# Patient Record
Sex: Female | Born: 1940 | Race: Black or African American | Hispanic: No | Smoking: Never smoker
Health system: Southern US, Community
[De-identification: ages and names within clinical notes are randomized; demographics above are authoritative.]

## PROBLEM LIST (undated history)

## (undated) DIAGNOSIS — M810 Age-related osteoporosis without current pathological fracture: Secondary | ICD-10-CM

## (undated) DIAGNOSIS — I10 Essential (primary) hypertension: Secondary | ICD-10-CM

## (undated) DIAGNOSIS — M1990 Unspecified osteoarthritis, unspecified site: Secondary | ICD-10-CM

## (undated) HISTORY — DX: Age-related osteoporosis without current pathological fracture: M81.0

## (undated) HISTORY — PX: TUBAL LIGATION: SHX77

## (undated) HISTORY — PX: TOTAL KNEE ARTHROPLASTY: SHX125

## (undated) HISTORY — PX: TOTAL HIP ARTHROPLASTY: SHX124

---

## 2001-12-25 ENCOUNTER — Encounter: Payer: Self-pay | Admitting: Emergency Medicine

## 2001-12-25 ENCOUNTER — Emergency Department (HOSPITAL_COMMUNITY): Admission: EM | Admit: 2001-12-25 | Discharge: 2001-12-25 | Payer: Self-pay | Admitting: Emergency Medicine

## 2002-03-03 ENCOUNTER — Observation Stay (HOSPITAL_COMMUNITY): Admission: EM | Admit: 2002-03-03 | Discharge: 2002-03-04 | Payer: Self-pay | Admitting: Emergency Medicine

## 2002-03-03 ENCOUNTER — Encounter: Payer: Self-pay | Admitting: Emergency Medicine

## 2002-03-04 ENCOUNTER — Encounter: Payer: Self-pay | Admitting: Internal Medicine

## 2002-03-12 ENCOUNTER — Ambulatory Visit (HOSPITAL_COMMUNITY): Admission: RE | Admit: 2002-03-12 | Discharge: 2002-03-12 | Payer: Self-pay | Admitting: General Surgery

## 2002-03-16 ENCOUNTER — Emergency Department (HOSPITAL_COMMUNITY): Admission: EM | Admit: 2002-03-16 | Discharge: 2002-03-16 | Payer: Self-pay | Admitting: *Deleted

## 2002-04-28 ENCOUNTER — Other Ambulatory Visit: Admission: RE | Admit: 2002-04-28 | Discharge: 2002-04-28 | Payer: Self-pay | Admitting: *Deleted

## 2002-04-30 ENCOUNTER — Encounter: Payer: Self-pay | Admitting: *Deleted

## 2002-04-30 ENCOUNTER — Ambulatory Visit (HOSPITAL_COMMUNITY): Admission: RE | Admit: 2002-04-30 | Discharge: 2002-04-30 | Payer: Self-pay | Admitting: Internal Medicine

## 2002-05-03 ENCOUNTER — Emergency Department (HOSPITAL_COMMUNITY): Admission: EM | Admit: 2002-05-03 | Discharge: 2002-05-03 | Payer: Self-pay | Admitting: *Deleted

## 2002-05-03 ENCOUNTER — Encounter: Payer: Self-pay | Admitting: *Deleted

## 2002-12-11 ENCOUNTER — Encounter: Payer: Self-pay | Admitting: Orthopedic Surgery

## 2002-12-15 ENCOUNTER — Inpatient Hospital Stay (HOSPITAL_COMMUNITY): Admission: RE | Admit: 2002-12-15 | Discharge: 2002-12-19 | Payer: Self-pay | Admitting: Orthopedic Surgery

## 2002-12-30 ENCOUNTER — Ambulatory Visit (HOSPITAL_COMMUNITY): Admission: RE | Admit: 2002-12-30 | Discharge: 2002-12-30 | Payer: Self-pay | Admitting: Orthopedic Surgery

## 2005-08-15 ENCOUNTER — Emergency Department (HOSPITAL_COMMUNITY): Admission: EM | Admit: 2005-08-15 | Discharge: 2005-08-15 | Payer: Self-pay | Admitting: Emergency Medicine

## 2006-04-20 ENCOUNTER — Ambulatory Visit (HOSPITAL_COMMUNITY): Admission: RE | Admit: 2006-04-20 | Discharge: 2006-04-20 | Payer: Self-pay | Admitting: *Deleted

## 2006-04-20 ENCOUNTER — Emergency Department (HOSPITAL_COMMUNITY): Admission: EM | Admit: 2006-04-20 | Discharge: 2006-04-20 | Payer: Self-pay | Admitting: Emergency Medicine

## 2013-04-29 ENCOUNTER — Emergency Department (HOSPITAL_COMMUNITY)
Admission: EM | Admit: 2013-04-29 | Discharge: 2013-04-29 | Disposition: A | Discharge status: Home / Self Care | Payer: Medicare HMO | Attending: Emergency Medicine | Admitting: Emergency Medicine

## 2013-04-29 ENCOUNTER — Emergency Department (HOSPITAL_COMMUNITY): Payer: Medicare HMO

## 2013-04-29 ENCOUNTER — Encounter (HOSPITAL_COMMUNITY): Payer: Self-pay | Admitting: Emergency Medicine

## 2013-04-29 DIAGNOSIS — M545 Low back pain, unspecified: Secondary | ICD-10-CM | POA: Insufficient documentation

## 2013-04-29 DIAGNOSIS — Z791 Long term (current) use of non-steroidal anti-inflammatories (NSAID): Secondary | ICD-10-CM | POA: Insufficient documentation

## 2013-04-29 DIAGNOSIS — Z79899 Other long term (current) drug therapy: Secondary | ICD-10-CM | POA: Insufficient documentation

## 2013-04-29 DIAGNOSIS — M129 Arthropathy, unspecified: Secondary | ICD-10-CM | POA: Insufficient documentation

## 2013-04-29 DIAGNOSIS — I10 Essential (primary) hypertension: Secondary | ICD-10-CM | POA: Insufficient documentation

## 2013-04-29 HISTORY — DX: Essential (primary) hypertension: I10

## 2013-04-29 HISTORY — DX: Unspecified osteoarthritis, unspecified site: M19.90

## 2013-04-29 MED ORDER — HYDROCODONE-ACETAMINOPHEN 5-325 MG PO TABS: 1.0000 | ORAL_TABLET | Freq: Four times a day (QID) | ORAL | Status: DC | PRN

## 2013-04-29 MED ORDER — HYDROCODONE-ACETAMINOPHEN 5-325 MG PO TABS
2.0000 | ORAL_TABLET | Freq: Once | ORAL | Status: AC
  Administered 2013-04-29: 2 via ORAL
  Filled 2013-04-29: qty 2

## 2013-04-29 NOTE — ED Notes (Signed)
Back pain for 1 week, alert, Says it hurts to get out of bed.  Alert, talkative.  Dr Adriana Simas in to examine.

## 2013-04-29 NOTE — ED Provider Notes (Signed)
Medical screening examination/treatment/procedure(s) were conducted as a shared visit with non-physician practitioner(s) and myself.  I personally evaluated the patient during the encounter.  EKG Interpretation   None      Lower back pain with radiation to left leg. No bowel or bladder incontinence. Plain films show a multilevel degenerative disc disease. Referral to general orthopedics in [place], MD 04/29/13 1506

## 2013-04-29 NOTE — ED Provider Notes (Signed)
CSN: [id number]     Arrival date & time 04/29/13  1006 History   First MD Initiated Contact with Patient 04/29/13 1041     Chief Complaint  Patient presents with  . Back Pain   (Consider location/radiation/quality/duration/timing/severity/associated sxs/prior Treatment) HPI Comments: Patient presents today with a chief complaint of lower back pain.  She reports that the pain has been present for the past 5 days and is gradually worsening.  Pain radiates down her left leg.  She has taken Tramadol for the pain without relief.  She denies any acute injury or trauma.  She reports that she has had similar pain once in the past.  She states that the pain is worse with movement.  She has been able to ambulate.  She denies numbness, tingling, fever, chills, urinary symptoms, or bowel/bladder incontinence.    The history is provided by the patient.    Past Medical History  Diagnosis Date  . Hypertension   . Arthritis    Past Surgical History  Procedure Laterality Date  . Total knee arthroplasty    . Tubal ligation     No family history on file. History  Substance Use Topics  . Smoking status: Never Smoker   . Smokeless tobacco: Not on file  . Alcohol Use: No   OB History   Grav Para Term Preterm Abortions TAB SAB Ect Mult Living                 Review of Systems  All other systems reviewed and are negative.    Allergies  Codeine and Diphenhydramine  Home Medications   Current Outpatient Rx  Name  Route  Sig  Dispense  Refill  . amLODipine (NORVASC) 10 MG tablet   Oral   Take 1 tablet by mouth daily.         . CELEBREX 200 MG capsule   Oral   Take 1 capsule by mouth daily.         . hydrochlorothiazide (HYDRODIURIL) 25 MG tablet   Oral   Take 1 tablet by mouth daily.         [name] lisinopril (PRINIVIL,ZESTRIL) 30 MG tablet   Oral   Take 1 tablet by mouth daily.         [name] NEXIUM 40 MG capsule   Oral   Take 1 capsule by mouth daily.         . potassium  chloride SA (K-DUR,KLOR-CON) 20 MEQ tablet   Oral   Take 1 tablet by mouth daily.         . traMADol (ULTRAM) 50 MG tablet   Oral   Take 1 tablet by mouth 3 (three) times daily.          BP 145/60  Pulse 86  Temp(Src) 98.3 F (36.8 C)  Resp 20  Ht 5\' 1"  (1.549 m)  Wt 207 lb (93.895 kg)  BMI 39.13 kg/m2  SpO2 100% Physical Exam  Nursing note and vitals reviewed. Constitutional: She appears well-developed and well-nourished.  HENT:  Head: Normocephalic and atraumatic.  Neck: Normal range of motion. Neck supple.  Cardiovascular: Normal rate, regular rhythm and normal heart sounds.   Pulmonary/Chest: Effort normal and breath sounds normal.  Musculoskeletal: Normal range of motion.       Cervical back: She exhibits normal range of motion, no tenderness, no bony tenderness, no swelling, no edema and no deformity.       Thoracic back: She exhibits normal range of motion, no  tenderness, no bony tenderness, no swelling, no edema and no deformity.       Lumbar back: She exhibits tenderness and bony tenderness. She exhibits normal range of motion, no swelling, no edema and no deformity.  Neurological: She is alert. She has normal strength. No sensory deficit. Gait normal.  Reflex Scores:      Patellar reflexes are 2+ on the right side and 2+ on the left side.      Achilles reflexes are 2+ on the right side and 2+ on the left side. Skin: Skin is warm and dry.  Psychiatric: She has a normal mood and affect.    ED Course  Procedures (including critical care time) Labs Review Labs Reviewed - No data to display Imaging Review Dg Lumbar Spine Complete  04/29/2013   CLINICAL DATA:  Back pain extending down the left leg for 1 week. No injury.  EXAM: LUMBAR SPINE - COMPLETE 4+ VIEW  COMPARISON:  None.  FINDINGS: There is mild upper lumbar levoscoliosis. No compression fracture is identified. There is no listhesis. Multilevel disc space narrowing is present throughout the lumbar spine,  most notably at L4-5 on the left where it is severe. There is also moderate to severe disc space narrowing at L5-S1 as well as moderate disc space narrowing at L3-4. Associated degenerative endplate spurring is present throughout the lumbar spine. No pars defects are identified. Facet arthrosis is present at L4-5 and L5-S1. Nonobstructed bowel gas pattern is partially visualized.  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   Electronically Signed   By: Sebastian Ache   On: 04/29/2013 12:22    EKG Interpretation   None      Patient discussed with Dr. Adriana Simas who also evaluated the patient. MDM  No diagnosis found. Patient with back pain.  No neurological deficits and normal neuro exam.  Patient can walk but states is painful.  No loss of bowel or bladder control.  No concern for cauda equina.  No fever, night sweats, weight loss, h/o cancer, IVDU.  Xray showing multilevel degenerative disc disease and facet arthrosis.  Patient instructed to follow up with her PCP and also given referral to Orthopedics.  Patient discharged home with Rx for Norco.  Patient stable for discharge.  Return precautions given.     Santiago Glad, PA-C 04/29/13 1421

## 2013-04-29 NOTE — ED Notes (Signed)
Pt c/o lower back pain radiating down left leg.  

## 2013-04-29 NOTE — ED Notes (Signed)
PA in with pt 

## 2014-02-20 ENCOUNTER — Emergency Department (HOSPITAL_COMMUNITY): Payer: Medicare HMO

## 2014-02-20 ENCOUNTER — Encounter (HOSPITAL_COMMUNITY): Payer: Self-pay | Admitting: Emergency Medicine

## 2014-02-20 ENCOUNTER — Emergency Department (HOSPITAL_COMMUNITY)
Admission: EM | Admit: 2014-02-20 | Discharge: 2014-02-20 | Disposition: A | Discharge status: Home / Self Care | Payer: Medicare HMO | Attending: Emergency Medicine | Admitting: Emergency Medicine

## 2014-02-20 DIAGNOSIS — I10 Essential (primary) hypertension: Secondary | ICD-10-CM | POA: Diagnosis not present

## 2014-02-20 DIAGNOSIS — E876 Hypokalemia: Secondary | ICD-10-CM | POA: Insufficient documentation

## 2014-02-20 DIAGNOSIS — Z79899 Other long term (current) drug therapy: Secondary | ICD-10-CM | POA: Diagnosis not present

## 2014-02-20 DIAGNOSIS — Z8739 Personal history of other diseases of the musculoskeletal system and connective tissue: Secondary | ICD-10-CM | POA: Insufficient documentation

## 2014-02-20 DIAGNOSIS — J029 Acute pharyngitis, unspecified: Secondary | ICD-10-CM | POA: Insufficient documentation

## 2014-02-20 DIAGNOSIS — R1310 Dysphagia, unspecified: Secondary | ICD-10-CM | POA: Diagnosis not present

## 2014-02-20 LAB — I-STAT CHEM 8, ED
BUN: 16 mg/dL (ref 6–23)
CREATININE: 1 mg/dL (ref 0.50–1.10)
Calcium, Ion: 1.24 mmol/L (ref 1.13–1.30)
Chloride: 102 mEq/L (ref 96–112)
GLUCOSE: 100 mg/dL — AB (ref 70–99)
HCT: 38 % (ref 36.0–46.0)
HEMOGLOBIN: 12.9 g/dL (ref 12.0–15.0)
Potassium: 2.9 mEq/L — CL (ref 3.7–5.3)
Sodium: 139 mEq/L (ref 137–147)
TCO2: 26 mmol/L (ref 0–100)

## 2014-02-20 MED ORDER — POTASSIUM CHLORIDE ER 10 MEQ PO TBCR: 10.0000 meq | EXTENDED_RELEASE_TABLET | Freq: Every day | ORAL | Status: DC

## 2014-02-20 MED ORDER — IOHEXOL 300 MG/ML  SOLN
75.0000 mL | Freq: Once | INTRAMUSCULAR | Status: AC | PRN
  Administered 2014-02-20: 75 mL via INTRAVENOUS

## 2014-02-20 NOTE — ED Notes (Signed)
Patient talking, no SOB, states sore when she swallows.

## 2014-02-20 NOTE — ED Notes (Signed)
Pt states was eating a piece of chicken this morning and choked on a piece of bone. Able to swallow but is painful.

## 2014-02-20 NOTE — Discharge Instructions (Signed)
Eat soft foods for 2-3 days until pain subsides - there is no bone or other foreign body on your xrays.  Please call your doctor for a followup appointment within 24-48 hours. When you talk to your doctor please let them know that you were seen in the emergency department and have them acquire all of your records so that they can discuss the findings with you and formulate a treatment plan to fully care for your new and ongoing problems.  [facility] Primary Care Doctor List    Sinda Du MD. Specialty: Pulmonary Disease Contact information: [address]  [place] [address]  [phone]   Tula Nakayama, MD. Specialty: [facility] Contact information: [address] [place] [address]  [phone]   Sallee Lange, MD. Specialty: [facility] Medicine Contact information: [place]  [place] [address]  [phone]   Rosita Fire, MD Specialty: Internal Medicine Contact information: [place] [address]  [phone]   Delphina Cahill, MD. Specialty: Internal Medicine Contact information: [place] [address]  [phone]   Marjean Donna, MD. Specialty: Family Medicine Contact information: [place] [address]  [phone]   Leslie Andrea, MD. Specialty: [facility] Medicine Contact information: [place] [address]  [phone]   Asencion Noble, MD. Specialty: Internal Medicine Contact information: [place]  [place] [address]  [phone]

## 2014-02-20 NOTE — ED Provider Notes (Signed)
Patient informed of results including low potassium as well as normal CT scan, stable for discharge, potassium prescription given.  Johnna Acosta, MD 02/20/14 [phone]

## 2014-02-20 NOTE — ED Provider Notes (Signed)
CSN: [id number]     Arrival date & time 02/20/14  1328 History  This chart was scribed for Melissa Cable, MD by Ludger Nutting, ED Scribe. This patient was seen in room APA09/APA09 and the patient's care was started 2:06 PM.    Chief Complaint  Patient presents with  . Dysphagia   Patient is a 73 y.o. female presenting with pharyngitis. The history is provided by the patient. No language interpreter was used.  Sore Throat This is a new problem. The current episode started 3 to 5 hours ago. The problem occurs constantly. The problem has not changed since onset.Pertinent negatives include no shortness of breath. The symptoms are aggravated by swallowing. Nothing relieves the symptoms. She has tried food and water for the symptoms. The treatment provided no relief.    HPI Comments: RAEGYN RENDA is a 73 y.o. female who presents to the Emergency Department complaining of choking and swallowing a bone while eating a piece of chicken 3 hours ago. Patient states she has pain with swallowing which she describes as burning. She states she tried the Heimlich maneuver and tried to vomit in order to remove the piece. She also attempted to eat bread with hopes that it would dislodge the bone. She states it feels as though the bone is "hanging there". She denies SOB, vomiting, difficulty tolerating secretions.   Past Medical History  Diagnosis Date  . Hypertension   . Arthritis    Past Surgical History  Procedure Laterality Date  . Total knee arthroplasty    . Tubal ligation    . Total knee arthroplasty Bilateral    History reviewed. No pertinent family history. History  Substance Use Topics  . Smoking status: Never Smoker   . Smokeless tobacco: Not on file  . Alcohol Use: No   OB History   Grav Para Term Preterm Abortions TAB SAB Ect Mult Living                 Review of Systems  HENT: Positive for sore throat. Negative for trouble swallowing.   Respiratory: Negative for shortness of  breath.   Gastrointestinal: Negative for vomiting.  All other systems reviewed and are negative.     Allergies  Codeine and Diphenhydramine  Home Medications   Prior to Admission medications   Medication Sig Start Date End Date Taking? Authorizing Provider  amLODipine (NORVASC) 10 MG tablet Take 1 tablet by mouth daily. 04/12/13   Historical Provider, MD  CELEBREX 200 MG capsule Take 1 capsule by mouth daily. 04/25/13   Historical Provider, MD  hydrochlorothiazide (HYDRODIURIL) 25 MG tablet Take 1 tablet by mouth daily. 04/12/13   Historical Provider, MD  HYDROcodone-acetaminophen (NORCO/VICODIN) 5-325 MG per tablet Take 1-2 tablets by mouth every 6 (six) hours as needed. 04/29/13   Heather Laisure, PA-C  lisinopril (PRINIVIL,ZESTRIL) 30 MG tablet Take 1 tablet by mouth daily. 04/16/13   Historical Provider, MD  NEXIUM 40 MG capsule Take 1 capsule by mouth daily. 04/12/13   Historical Provider, MD  potassium chloride SA (K-DUR,KLOR-CON) 20 MEQ tablet Take 1 tablet by mouth daily. 04/16/13   Historical Provider, MD  traMADol (ULTRAM) 50 MG tablet Take 1 tablet by mouth 3 (three) times daily. 04/16/13   Historical Provider, MD   BP 138/64  Pulse 94  Temp(Src) 98.3 F (36.8 C) (Oral)  Resp 16  Ht 5\' 1"  (1.549 m)  Wt 202 lb (91.627 kg)  BMI 38.19 kg/m2  SpO2 97% Physical Exam  Nursing  note and vitals reviewed.  CONSTITUTIONAL: Well developed/well nourished HEAD: Normocephalic/atraumatic EYES: EOMI/PERRL ENMT: Mucous membranes moist, uvula midline, no foreign body noted in oropharynx. No stridor or crepitus  NECK: supple no meningeal signs SPINE:entire spine nontender CV: S1/S2 noted, no murmurs/rubs/gallops noted LUNGS: Lungs are clear to auscultation bilaterally, no apparent distress ABDOMEN: soft, nontender, no rebound or guarding GU:no cva tenderness NEURO: Pt is awake/alert, moves all extremitiesx4 EXTREMITIES: pulses normal, full ROM SKIN: warm, color normal PSYCH: no  abnormalities of mood noted  ED Course  Procedures   DIAGNOSTIC STUDIES: Oxygen Saturation is 97% on RA, adequate by my interpretation.    COORDINATION OF CARE: 2:11 PM Discussed treatment plan with pt at bedside and pt agreed to plan.   3:51 PM D/w radiology - unable to r/o foreign body on xray Recommends CT neck with contast Signed out to dr [name] Heck pending CT neck If negative can be discharged home Imaging Review Dg Neck Soft Tissue  02/20/2014   CLINICAL DATA:  Choking while eating, sensation of chicken bone stuck in the throat. Throat irritation with swallowing. Dysphagia.  EXAM: NECK SOFT TISSUES - 1+ VIEW  COMPARISON:  Report from 12/25/2001  FINDINGS: Various calcifications projecting over the neck include the hyoid bone, irregularly calcified thyroid cartilage, possibly calcified are rate noise cartilage, and vascular calcifications from the carotid vessels. Against this background I do not observe a definite chicken bone, although CT would have a better negative predictive value.  1.5 mm anterior subluxation at C4-5 and loss of disc height and spurring at C5-6.  Epiglottis and ariepiglottic folds appear normal.  IMPRESSION: 1. I do not directly observe a chicken bone and there is no gas in the soft tissues of the neck nor is there prevertebral soft tissue swelling. Various calcifications including the hyoid bone, calcified thyroid cartilage, possibly calcified arytenoid cartilage, and vascular calcifications do project over the neck and could potentially obscure a small bone. CT of the neck would provide a better negative predictive value. 2. Cervical spondylosis and degenerative disc disease particularly at C5-6.   Electronically Signed   By: Sherryl Barters M.D.   On: 02/20/2014 15:00   Dg Chest 2 View  02/20/2014   CLINICAL DATA:  Initial encounter for choking sensation after swallowing chicken bone earlier today.  EXAM: CHEST  2 VIEW  COMPARISON:  08/15/05.  FINDINGS: The lungs are  clear without focal infiltrate, edema, pneumothorax or pleural effusion. Cardiopericardial silhouette is at upper limits of normal for size. Imaged bony structures of the thorax are intact.  IMPRESSION: No active cardiopulmonary disease.   Electronically Signed   By: Misty Stanley M.D.   On: 02/20/2014 15:15      MDM   Final diagnoses:  Odynophagia    Nursing notes including past medical history and social history reviewed and considered in documentation xrays reviewed and considered   I personally performed the services described in this documentation, which was scribed in my presence. The recorded information has been reviewed and is accurate.      Melissa Cable, MD 02/20/14 [phone]

## 2015-06-29 ENCOUNTER — Emergency Department (HOSPITAL_COMMUNITY): Payer: Medicare HMO

## 2015-06-29 ENCOUNTER — Emergency Department (HOSPITAL_COMMUNITY)
Admission: EM | Admit: 2015-06-29 | Discharge: 2015-06-29 | Disposition: A | Discharge status: Home / Self Care | Payer: Medicare HMO | Attending: Emergency Medicine | Admitting: Emergency Medicine

## 2015-06-29 ENCOUNTER — Encounter (HOSPITAL_COMMUNITY): Payer: Self-pay | Admitting: Emergency Medicine

## 2015-06-29 DIAGNOSIS — M1990 Unspecified osteoarthritis, unspecified site: Secondary | ICD-10-CM | POA: Diagnosis not present

## 2015-06-29 DIAGNOSIS — Z791 Long term (current) use of non-steroidal anti-inflammatories (NSAID): Secondary | ICD-10-CM | POA: Insufficient documentation

## 2015-06-29 DIAGNOSIS — Z7982 Long term (current) use of aspirin: Secondary | ICD-10-CM | POA: Insufficient documentation

## 2015-06-29 DIAGNOSIS — Y9301 Activity, walking, marching and hiking: Secondary | ICD-10-CM | POA: Diagnosis not present

## 2015-06-29 DIAGNOSIS — S46911A Strain of unspecified muscle, fascia and tendon at shoulder and upper arm level, right arm, initial encounter: Secondary | ICD-10-CM | POA: Diagnosis not present

## 2015-06-29 DIAGNOSIS — Y998 Other external cause status: Secondary | ICD-10-CM | POA: Insufficient documentation

## 2015-06-29 DIAGNOSIS — Z79899 Other long term (current) drug therapy: Secondary | ICD-10-CM | POA: Diagnosis not present

## 2015-06-29 DIAGNOSIS — I10 Essential (primary) hypertension: Secondary | ICD-10-CM | POA: Insufficient documentation

## 2015-06-29 DIAGNOSIS — W010XXA Fall on same level from slipping, tripping and stumbling without subsequent striking against object, initial encounter: Secondary | ICD-10-CM | POA: Diagnosis not present

## 2015-06-29 DIAGNOSIS — Y9289 Other specified places as the place of occurrence of the external cause: Secondary | ICD-10-CM | POA: Insufficient documentation

## 2015-06-29 DIAGNOSIS — S4991XA Unspecified injury of right shoulder and upper arm, initial encounter: Secondary | ICD-10-CM | POA: Diagnosis present

## 2015-06-29 MED ORDER — HYDROCODONE-ACETAMINOPHEN 5-325 MG PO TABS
1.0000 | ORAL_TABLET | Freq: Once | ORAL | Status: AC
  Administered 2015-06-29: 1 via ORAL
  Filled 2015-06-29: qty 1

## 2015-06-29 MED ORDER — HYDROCODONE-ACETAMINOPHEN 5-325 MG PO TABS: ORAL_TABLET | ORAL | Status: DC

## 2015-06-29 NOTE — ED Notes (Signed)
PT states she was picking up some trash out of the yard and tripped over a rock and caught herself from falling on her right arm. PT c/o right shoulder pain with ROM.

## 2015-06-29 NOTE — ED Notes (Signed)
   06/29/15 1454  Musculoskeletal  Musculoskeletal (WDL) X  RUE Limited movement  pt states she fell bracing herself w/ her right arm . Pt is able to move wrist & elbow w/o pain. Pain in right shoulder w/ movement. Pt has good sensation & grip. Pulses present & cap refill is less than 2 seconds.

## 2015-06-29 NOTE — ED Notes (Signed)
Pt alert & oriented x4, stable gait. Patient given discharge instructions, paperwork & prescription(s). Patient informed not to drive, operate any equipment & handel any important documents 4 hours after taking pain medication. Patient  instructed to stop at the registration desk to finish any additional paperwork. Patient  verbalized understanding. Pt left department w/ no further questions. 

## 2015-06-29 NOTE — ED Provider Notes (Signed)
CSN: [id number]     Arrival date & time 06/29/15  1410 History   First MD Initiated Contact with Patient 06/29/15 1511     Chief Complaint  Patient presents with  . Shoulder Injury     (Consider location/radiation/quality/duration/timing/severity/associated sxs/prior Treatment) HPI   Melissa Zavala is a 75 y.o. female who presents to the Emergency Department complaining of sudden onset of right shoulder pain after a mechanical fall.  She states that she walking to the mailbox and accidentally tripped.  States she fell on her right hand but feels pain into her shoulder.  Pain is worse with movement of the shoulder and improves with the arm kept in a stationary position.  She has not taken any medications or tried anything for symptoms relief.  She denies neck pain, head injury, swelling, and pain to the elbow, forearm or wrist.     Past Medical History  Diagnosis Date  . Hypertension   . Arthritis    Past Surgical History  Procedure Laterality Date  . Total knee arthroplasty    . Tubal ligation    . Total knee arthroplasty Bilateral    History reviewed. No pertinent family history. Social History  Substance Use Topics  . Smoking status: Never Smoker   . Smokeless tobacco: None  . Alcohol Use: No   OB History    No data available     Review of Systems  Constitutional: Negative for fever and chills.  Respiratory: Negative for shortness of breath.   Cardiovascular: Negative for chest pain.  Musculoskeletal: Positive for arthralgias (Right shoulder pain). Negative for joint swelling, neck pain and neck stiffness.  Skin: Negative for color change and wound.  Neurological: Negative for syncope, weakness and numbness.  All other systems reviewed and are negative.     Allergies  Iodinated diagnostic agents; Codeine; and Diphenhydramine  Home Medications   Prior to Admission medications   Medication Sig Start Date End Date Taking? Authorizing Provider  amLODipine  (NORVASC) 10 MG tablet Take 1 tablet by mouth daily. 04/12/13  Yes Historical Provider, MD  aspirin EC 325 MG tablet Take 325-650 mg by mouth daily as needed for mild pain or moderate pain.   Yes Historical Provider, MD  CELEBREX 200 MG capsule Take 1 capsule by mouth daily. 04/25/13  Yes Historical Provider, MD  hydrochlorothiazide (HYDRODIURIL) 25 MG tablet Take 1 tablet by mouth daily. 04/12/13  Yes Historical Provider, MD  lisinopril (PRINIVIL,ZESTRIL) 30 MG tablet Take 1 tablet by mouth daily. 04/16/13  Yes Historical Provider, MD  NEXIUM 40 MG capsule Take 1 capsule by mouth daily. 04/12/13  Yes Historical Provider, MD  potassium chloride (K-DUR) 10 MEQ tablet Take 1 tablet (10 mEq total) by mouth daily. 02/20/14  Yes Noemi Chapel, MD  traMADol (ULTRAM) 50 MG tablet Take 100 mg by mouth daily.  04/16/13  Yes Historical Provider, MD  vitamin E 400 UNIT capsule Take 400 Units by mouth daily.   Yes Historical Provider, MD  HYDROcodone-acetaminophen (NORCO/VICODIN) 5-325 MG tablet Take one tab po q 4-6 hrs prn pain 06/29/15   Wanna Gully, PA-C   BP 137/69 mmHg  Pulse 67  Temp(Src) 97.8 F (36.6 C) (Oral)  Resp 18  Ht 5' (1.524 m)  Wt 91.627 kg  BMI 39.45 kg/m2  SpO2 100% Physical Exam  Constitutional: She is oriented to person, place, and time. She appears well-developed and well-nourished. No distress.  HENT:  Head: Normocephalic and atraumatic.  Neck: Normal range of motion. Neck  supple. No thyromegaly present.  No tenderness of the cervical spine. Patient has full range of motion  Cardiovascular: Normal rate, regular rhythm and intact distal pulses.   No murmur heard. Pulmonary/Chest: Effort normal and breath sounds normal. No respiratory distress. She exhibits no tenderness.  Musculoskeletal: She exhibits tenderness. She exhibits no edema.  ttp of the anterior and lateral right shoulder.  Pain with abduction of the right arm and rotation of the shoulder.  Radial pulse is brisk,  distal sensation intact, CR< 2 sec. Grip strength is strong and symmetrical.   No edema , erythema or step-off deformity of the joint.   Lymphadenopathy:    She has no cervical adenopathy.  Neurological: She is alert and oriented to person, place, and time. She has normal strength. No sensory deficit. She exhibits normal muscle tone. Coordination normal.  Reflex Scores:      Tricep reflexes are 1+ on the right side and 2+ on the left side.      Bicep reflexes are 1+ on the right side and 2+ on the left side. Skin: Skin is warm and dry.  Nursing note and vitals reviewed.   ED Course  Procedures (including critical care time) Labs Review Labs Reviewed - No data to display  Imaging Review Dg Shoulder Right  06/29/2015  CLINICAL DATA:  Pain following fall EXAM: RIGHT SHOULDER - 2+ VIEW COMPARISON:  None. FINDINGS: Frontal, Y scapular, and axillary images were obtained. There is no demonstrable fracture or dislocation. There is bony overgrowth at the acromioclavicular joint with moderate osteoarthritic change in this region. The glenohumeral joint appears normal. No erosive change or intra-articular calcification. 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 No acute fracture or dislocation. Electronically Signed   By: Lowella Grip III M.D.   On: 06/29/2015 15:12   I have personally reviewed and evaluated these images and lab results as part of my medical decision-making.   EKG Interpretation None      MDM   Final diagnoses:  Shoulder strain, right, initial encounter    X-ray results reviewed with patient.  No focal neuro deficits. Likely strain, but occult fx was also discussed. She agrees to treatment with ice, pain medication and close orthopedic follow-up with Dr. Aline Brochure. She appears stable for discharge and agrees to plan.     Kem Parkinson, PA-C 06/29/15 1708  Milton Ferguson, MD 06/29/15 [phone]

## 2015-07-06 ENCOUNTER — Encounter: Payer: Self-pay | Admitting: Orthopaedic Surgery

## 2015-07-06 ENCOUNTER — Ambulatory Visit (INDEPENDENT_AMBULATORY_CARE_PROVIDER_SITE_OTHER): Payer: Medicare HMO | Admitting: Orthopaedic Surgery

## 2015-07-06 VITALS — BP 126/66 | HR 79 | Temp 99.0°F | Ht 63.0 in | Wt 215.4 lb

## 2015-07-06 DIAGNOSIS — M25511 Pain in right shoulder: Secondary | ICD-10-CM | POA: Diagnosis not present

## 2015-07-06 NOTE — Patient Instructions (Addendum)
Take one aleve twice a day    Joint Injection  Care After  Refer to this sheet in the next few days. These instructions provide you with information on caring for yourself after you have had a joint injection. Your caregiver also may give you more specific instructions. Your treatment has been planned according to current medical practices, but problems sometimes occur. Call your caregiver if you have any problems or questions after your procedure.  After any type of joint injection, it is not uncommon to experience:  Soreness, swelling, or bruising around the injection site.  Mild numbness, tingling, or weakness around the injection site caused by the numbing medicine used before or with the injection. It also is possible to experience the following effects associated with the specific agent after injection:  Iodine-based contrast agents:  Allergic reaction (itching, hives, widespread redness, and swelling beyond the injection site).  Corticosteroids (These effects are rare.):  Allergic reaction.  Increased blood sugar levels (If you have diabetes and you notice that your blood sugar levels have increased, notify your caregiver).  Increased blood pressure levels.  Mood swings.  Hyaluronic acid in the use of viscosupplementation.  Temporary heat or redness.  Temporary rash and itching.  Increased fluid accumulation in the injected joint. These effects all should resolve within a day after your procedure.  HOME CARE INSTRUCTIONS  Limit yourself to light activity the day of your procedure. Avoid lifting heavy objects, bending, stooping, or twisting.  Take prescription or over-the-counter pain medication as directed by your caregiver.  You may apply ice to your injection site to reduce pain and swelling the day of your procedure. Ice may be applied 3-4 times:  Put ice in a plastic bag.  Place a towel between your skin and the bag.  Leave the ice on for no longer than 15-20 minutes each  time. SEEK IMMEDIATE MEDICAL CARE IF:  Pain and swelling get worse rather than better or extend beyond the injection site.  Numbness does not go away.  Blood or fluid continues to leak from the injection site.  You have chest pain.  You have swelling of your face or tongue.  You have trouble breathing or you become dizzy.  You develop a fever, chills, or severe tenderness at the injection site that last longer than 1 day. MAKE SURE YOU:  Understand these instructions.  Watch your condition.  Get help right away if you are not doing well or if you get worse. Document Released: 01/12/2011 Document Revised: 07/24/2011 Document Reviewed: 01/12/2011  [facility] Patient Information 2014 [employer].

## 2015-07-06 NOTE — Progress Notes (Signed)
Patient [id number] Melissa Zavala, female DOB:Oct 04, 1940, 75 y.o. [id number]  Chief Complaint  Patient presents with  . Follow-up    ER Follow up for right shoulder pain d/t fall    HPI  Melissa Zavala is a 75 y.o. female who fell on Valentine's Day and hurt her right shoulder.  She fell on a rock after getting her mail in her yard.  She was seen in the ER.  X-rays were negative.  She had no other injury.  She has pain still in the right shoulder with overhead motion.  She has no numbness. She has taken Tylenol with some help.  She has no redness, no swelling.  She has tried ice which helped some.  She has no neck or head problems.  HPI  Body mass index is 38.17 kg/(m^2).  Review of Systems  Patient does not have Diabetes Mellitus. Patient has hypertension. Patient does not have COPD or shortness of breath. Patient has BMI > 35. Patient does not have current smoking history.  Review of Systems  Constitutional:       Patient does not have Diabetes Mellitus. Patient has hypertension. Patient does not have COPD or shortness of breath. Patient has BMI > 35. Patient does not have current smoking history.    Past Medical History  Diagnosis Date  . Hypertension   . Arthritis     Past Surgical History  Procedure Laterality Date  . Total knee arthroplasty    . Tubal ligation    . Total knee arthroplasty Bilateral     History reviewed. No pertinent family history.  Social History Social History  Substance Use Topics  . Smoking status: Never Smoker   . Smokeless tobacco: None  . Alcohol Use: No    Allergies  Allergen Reactions  . Iodinated Diagnostic Agents Anaphylaxis  . Codeine Other (See Comments)    Hallucinations  . Diphenhydramine Other (See Comments)    Patient states "feels like somebody is inside of me clawing me out"    Current Outpatient Prescriptions  Medication Sig Dispense Refill  . amLODipine (NORVASC) 10 MG tablet Take 1 tablet by mouth daily.    [name]  aspirin EC 325 MG tablet Take 325-650 mg by mouth daily as needed for mild pain or moderate pain.    [name] CELEBREX 200 MG capsule Take 1 capsule by mouth daily.    . hydrochlorothiazide (HYDRODIURIL) 25 MG tablet Take 1 tablet by mouth daily.    [name] HYDROcodone-acetaminophen (NORCO/VICODIN) 5-325 MG tablet Take one tab po q 4-6 hrs prn pain 15 tablet 0  . lisinopril (PRINIVIL,ZESTRIL) 30 MG tablet Take 1 tablet by mouth daily.    [name] NEXIUM 40 MG capsule Take 1 capsule by mouth daily.    . potassium chloride (K-DUR) 10 MEQ tablet Take 1 tablet (10 mEq total) by mouth daily. 5 tablet 0  . traMADol (ULTRAM) 50 MG tablet Take 100 mg by mouth daily.     . vitamin E 400 UNIT capsule Take 400 Units by mouth daily.     No current facility-administered medications for this visit.     Physical Exam  Blood pressure 126/66, pulse 79, temperature 99 F (37.2 C), height 5\' 3"  (1.6 m), weight 215 lb 6.4 oz (97.705 kg).  Constitutional: overall normal hygiene, normal nutrition, well developed, normal grooming, normal body habitus. Assistive device:none  Musculoskeletal: gait and station Limp none, muscle tone and strength are normal, no tremors or atrophy is present.  .  Neurological: coordination overall  normal.  Deep tendon reflex/nerve stretch intact.  Sensation normal.  Cranial nerves II-XII intact.   Skin:   normal overall no scars, lesions, ulcers or rashes. No psoriasis.  Psychiatric: Alert and oriented x 3.  Recent memory intact, remote memory unclear.  Normal mood and affect. Well groomed.  Good eye contact.  Cardiovascular: overall no swelling, no varicosities, no edema bilaterally, normal temperatures of the legs and arms, no clubbing, cyanosis and good capillary refill.  Lymphatic: palpation is normal.  Examination of right Upper Extremity is done.  Inspection:   Overall:  Elbow non-tender without crepitus or defects, forearm non-tender without crepitus or defects, wrist non-tender without  crepitus or defects, hand non-tender.    Shoulder: with glenohumeral joint tenderness, without effusion.   Upper arm: without swelling and tenderness   Range of motion:   Overall:  Full range of motion of the elbow, full range of motion of wrist and full range of motion in fingers.   Shoulder:  right  150 degrees forward flexion; 100 degrees abduction; 30 degrees internal rotation, 30 degrees external rotation, 10 degrees extension, 35 degrees adduction.   Stability:   Overall:  Shoulder, elbow and wrist stable   Strength and Tone:   Overall full shoulder muscles strength, full upper arm strength and normal upper arm bulk and tone. Extremities:The left shoulder is not tender.  The lower extremities are not tender. Inspection normal left shoulder and lower extremities Strength and tone normal left shoulder and lower extremities Range of motion normal of the lower extremities.  She lacks full forward flexion on the left by 20 degrees, abduction 145, internal 35, external 35, adduction full and extension 15  The patient request injection, verbal consent was obtained.  The right shoulder was prepped appropriately after time out was performed.   Sterile technique was observed and injection of 1 cc of Depo-Medrol 40 mg with several cc's of plain xylocaine. Anesthesia was provided by ethyl chloride and a 20-gauge needle was used to inject the shoulder area. A posterior approach was used.  The injection was tolerated well.  A band aid dressing was applied.  The patient was advised to apply ice later today and tomorrow to the injection sight as needed. Additional services performed: I reviewed her ER records, the x-rays and the x-ray report of the right shoulder.  I have gone over precautions about her taking care of her 2 year old mother who lives with her and requires extensive caregiver care.    I have explained about taking Aleve one bid pc.Stop the Celebrex  She may need a MRI of the  right shoulder if not improved.  The patient has been educated about the nature of the problem(s) and counseled on treatment options.  The patient appeared to understand what I have discussed and is in agreement with it.  PLAN Call if any problems.  Precautions discussed.  Continue current medications.   Return to clinic 2 wks

## 2015-07-20 ENCOUNTER — Telehealth: Payer: Self-pay | Admitting: *Deleted

## 2015-07-20 ENCOUNTER — Ambulatory Visit (INDEPENDENT_AMBULATORY_CARE_PROVIDER_SITE_OTHER): Payer: Medicare HMO | Admitting: Orthopaedic Surgery

## 2015-07-20 VITALS — BP 160/85 | HR 81 | Temp 99.7°F | Ht 63.0 in | Wt 215.8 lb

## 2015-07-20 DIAGNOSIS — M25511 Pain in right shoulder: Secondary | ICD-10-CM

## 2015-07-20 MED ORDER — HYDROCODONE-ACETAMINOPHEN 5-325 MG PO TABS: 1.0000 | ORAL_TABLET | ORAL | Status: DC | PRN

## 2015-07-20 NOTE — Patient Instructions (Signed)
MRI ORDERED. We will contact your insurance company for pre-certification. After we receive that, we will schedule you an appointment for the MRI and contact you. If you have not heard from our office in one week, contact us.     

## 2015-07-20 NOTE — Progress Notes (Signed)
Patient [id number] Melissa Zavala, female DOB:[birth year], 75 y.o. [id number]  Chief Complaint  Patient presents with  . Follow-up    Right shoulder pain    HPI  Melissa Zavala is a 75 y.o. female who has continued right shoulder pain.  The injection last time did not help.  She has more pain and she says it is worse.  She has no paresthesias.  She has no redness or crepitus.  She has a small area just under the axillae area she asked me if it was infected. It is not.  It is not red.   HPI  Body mass index is 38.24 kg/(m^2).   Review of Systems  Constitutional:       Patient does not have Diabetes Mellitus. Patient has hypertension. Patient does not have COPD or shortness of breath. Patient has BMI > 35. Patient does not have current smoking history.    Past Medical History  Diagnosis Date  . Hypertension   . Arthritis     Past Surgical History  Procedure Laterality Date  . Total knee arthroplasty    . Tubal ligation    . Total knee arthroplasty Bilateral     No family history on file.  Social History Social History  Substance Use Topics  . Smoking status: Never Smoker   . Smokeless tobacco: Not on file  . Alcohol Use: No    Allergies  Allergen Reactions  . Iodinated Diagnostic Agents Anaphylaxis  . Codeine Other (See Comments)    Hallucinations  . Diphenhydramine Other (See Comments)    Patient states "feels like somebody is inside of me clawing me out"    Current Outpatient Prescriptions  Medication Sig Dispense Refill  . amLODipine (NORVASC) 10 MG tablet Take 1 tablet by mouth daily.    [name] aspirin EC 325 MG tablet Take 325-650 mg by mouth daily as needed for mild pain or moderate pain.    [name] CELEBREX 200 MG capsule Take 1 capsule by mouth daily.    . hydrochlorothiazide (HYDRODIURIL) 25 MG tablet Take 1 tablet by mouth daily.    [name] lisinopril (PRINIVIL,ZESTRIL) 30 MG tablet Take 1 tablet by mouth daily.    [name] NEXIUM 40 MG capsule Take 1 capsule by mouth  daily.    . potassium chloride (K-DUR) 10 MEQ tablet Take 1 tablet (10 mEq total) by mouth daily. 5 tablet 0  . traMADol (ULTRAM) 50 MG tablet Take 100 mg by mouth daily.     . vitamin E 400 UNIT capsule Take 400 Units by mouth daily.    [name] HYDROcodone-acetaminophen (NORCO/VICODIN) 5-325 MG tablet Take 1 tablet by mouth every 4 (four) hours as needed for moderate pain (Must last 30 days.  Do not take and drive a car or use machinery.). 120 tablet 0   No current facility-administered medications for this visit.     Physical Exam  Blood pressure 160/85, pulse 81, temperature 99.7 F (37.6 C), height 5\' 3"  (1.6 m), weight 215 lb 12.8 oz (97.886 kg).  Constitutional: overall normal hygiene, normal nutrition, well developed, normal grooming, normal body habitus. Assistive device:none  Musculoskeletal: gait and station Limp none, muscle tone and strength are normal, no tremors or atrophy is present.  .  Neurological: coordination overall normal.  Deep tendon reflex/nerve stretch intact.  Sensation normal.  Cranial nerves II-XII intact.   Skin:   normal overall no scars, lesions, ulcers or rashes. No psoriasis.  Psychiatric: Alert and oriented x 3.  Recent memory intact, remote  memory unclear.  Normal mood and affect. Well groomed.  Good eye contact.  Cardiovascular: overall no swelling, no varicosities, no edema bilaterally, normal temperatures of the legs and arms, no clubbing, cyanosis and good capillary refill.  Lymphatic: palpation is normal.  Examination of right Upper Extremity is done.  Inspection:   Overall:  Elbow non-tender without crepitus or defects, forearm non-tender without crepitus or defects, wrist non-tender without crepitus or defects, hand non-tender.    Shoulder: with glenohumeral joint tenderness, without effusion.   Upper arm: without swelling and tenderness   Range of motion:   Overall:  Full range of motion of the elbow, full range of motion of wrist and full  range of motion in fingers.   Shoulder:  right  160 degrees forward flexion; 150 degrees abduction; 35 degrees internal rotation, 35 degrees external rotation, 25 degrees extension, 40 degrees adduction.   Stability:   Overall:  Shoulder, elbow and wrist stable   Strength and Tone:   Overall full shoulder muscles strength, full upper arm strength and normal upper arm bulk and tone.   Additional services performed: MRI ordered of the shoulder as she is not any better  The patient has been educated about the nature of the problem(s) and counseled on treatment options.  The patient appeared to understand what I have discussed and is in agreement with it.  PLAN Call if any problems.  Precautions discussed.  Continue current medications.   Return to clinic after MRI

## 2015-07-20 NOTE — Telephone Encounter (Signed)
MRI denied.... Office notes faxed to nurse reviewer for review. Awaiting response.

## 2015-08-03 ENCOUNTER — Telehealth: Payer: Self-pay | Admitting: Orthopaedic Surgery

## 2015-08-03 NOTE — Telephone Encounter (Signed)
Patient asking about MRI. She came in asking if it had been set up. I told her that I would contact the nurse and someone would be getting back to her.

## 2015-08-05 ENCOUNTER — Telehealth: Payer: Self-pay | Admitting: *Deleted

## 2015-08-05 NOTE — Telephone Encounter (Signed)
Pharmacy faxed request for cyclobenzaprine 10mg   One by mouth every 8 hours as needed for spasms #90

## 2015-08-10 MED ORDER — CYCLOBENZAPRINE HCL 10 MG PO TABS: 10.0000 mg | ORAL_TABLET | Freq: Three times a day (TID) | ORAL | Status: DC | PRN

## 2015-08-10 NOTE — Addendum Note (Signed)
Addended by: Willette Pa on: 08/10/2015 10:40 AM   Modules accepted: Orders

## 2015-08-10 NOTE — Telephone Encounter (Signed)
Rx done. 

## 2015-08-11 NOTE — Telephone Encounter (Signed)
Insurance is not active per online. I called patient to advise, she states she will call us back after she speaks with her [employer].

## 2015-11-23 ENCOUNTER — Encounter: Payer: Self-pay | Admitting: Physician Assistant

## 2015-11-28 ENCOUNTER — Emergency Department (HOSPITAL_COMMUNITY)
Admission: EM | Admit: 2015-11-28 | Discharge: 2015-11-28 | Disposition: A | Discharge status: Home / Self Care | Payer: Medicare HMO | Attending: Emergency Medicine | Admitting: Emergency Medicine

## 2015-11-28 ENCOUNTER — Encounter (HOSPITAL_COMMUNITY): Payer: Self-pay | Admitting: Emergency Medicine

## 2015-11-28 ENCOUNTER — Emergency Department (HOSPITAL_COMMUNITY): Payer: Medicare HMO

## 2015-11-28 DIAGNOSIS — Z7982 Long term (current) use of aspirin: Secondary | ICD-10-CM | POA: Diagnosis not present

## 2015-11-28 DIAGNOSIS — Z79899 Other long term (current) drug therapy: Secondary | ICD-10-CM | POA: Insufficient documentation

## 2015-11-28 DIAGNOSIS — M7989 Other specified soft tissue disorders: Secondary | ICD-10-CM | POA: Diagnosis present

## 2015-11-28 DIAGNOSIS — M1990 Unspecified osteoarthritis, unspecified site: Secondary | ICD-10-CM | POA: Insufficient documentation

## 2015-11-28 DIAGNOSIS — R609 Edema, unspecified: Secondary | ICD-10-CM | POA: Insufficient documentation

## 2015-11-28 DIAGNOSIS — I10 Essential (primary) hypertension: Secondary | ICD-10-CM | POA: Insufficient documentation

## 2015-11-28 DIAGNOSIS — I509 Heart failure, unspecified: Secondary | ICD-10-CM | POA: Insufficient documentation

## 2015-11-28 LAB — CBC WITH DIFFERENTIAL/PLATELET
Basophils Absolute: 0 10*3/uL (ref 0.0–0.1)
Basophils Relative: 0 %
Eosinophils Absolute: 0.3 10*3/uL (ref 0.0–0.7)
Eosinophils Relative: 3 %
HCT: 34.4 % — ABNORMAL LOW (ref 36.0–46.0)
Hemoglobin: 11.4 g/dL — ABNORMAL LOW (ref 12.0–15.0)
LYMPHS ABS: 2.6 10*3/uL (ref 0.7–4.0)
LYMPHS PCT: 27 %
MCH: 27 pg (ref 26.0–34.0)
MCHC: 33.1 g/dL (ref 30.0–36.0)
MCV: 81.3 fL (ref 78.0–100.0)
MONO ABS: 0.8 10*3/uL (ref 0.1–1.0)
MONOS PCT: 8 %
Neutro Abs: 5.8 10*3/uL (ref 1.7–7.7)
Neutrophils Relative %: 62 %
Platelets: 289 10*3/uL (ref 150–400)
RBC: 4.23 MIL/uL (ref 3.87–5.11)
RDW: 13.9 % (ref 11.5–15.5)
WBC: 9.5 10*3/uL (ref 4.0–10.5)

## 2015-11-28 LAB — COMPREHENSIVE METABOLIC PANEL
ALT: 14 U/L (ref 14–54)
ANION GAP: 3 — AB (ref 5–15)
AST: 16 U/L (ref 15–41)
Albumin: 4 g/dL (ref 3.5–5.0)
Alkaline Phosphatase: 58 U/L (ref 38–126)
BUN: 21 mg/dL — ABNORMAL HIGH (ref 6–20)
CHLORIDE: 106 mmol/L (ref 101–111)
CO2: 28 mmol/L (ref 22–32)
CREATININE: 0.79 mg/dL (ref 0.44–1.00)
Calcium: 9.4 mg/dL (ref 8.9–10.3)
GFR calc Af Amer: >60 mL/min (ref >60)
GFR calc non Af Amer: >60 mL/min (ref >60)
Glucose, Bld: 105 mg/dL — ABNORMAL HIGH (ref 65–99)
POTASSIUM: 3.5 mmol/L (ref 3.5–5.1)
SODIUM: 137 mmol/L (ref 135–145)
Total Bilirubin: 0.4 mg/dL (ref 0.3–1.2)
Total Protein: 6.9 g/dL (ref 6.5–8.1)

## 2015-11-28 LAB — BRAIN NATRIURETIC PEPTIDE: B NATRIURETIC PEPTIDE 5: 48 pg/mL (ref 0.0–100.0)

## 2015-11-28 LAB — TROPONIN I

## 2015-11-28 MED ORDER — FUROSEMIDE 10 MG/ML IJ SOLN
40.0000 mg | Freq: Once | INTRAMUSCULAR | Status: AC
  Administered 2015-11-28: 40 mg via INTRAVENOUS
  Filled 2015-11-28: qty 4

## 2015-11-28 MED ORDER — FUROSEMIDE 20 MG PO TABS: 20.0000 mg | ORAL_TABLET | Freq: Every day | ORAL | Status: DC

## 2015-11-28 NOTE — ED Notes (Signed)
Patient ambulated to restroom. Steady gait.

## 2015-11-28 NOTE — Discharge Instructions (Signed)

## 2015-11-28 NOTE — ED Provider Notes (Signed)
CSN: [id number]     Arrival date & time 11/28/15  1831 History   First MD Initiated Contact with Patient 11/28/15 1839     Chief Complaint  Patient presents with  . Leg Swelling  Pt is a 75 yo wf with no hx of CHF who said that she has had bilateral le swelling for the past 2 weeks.  She has noticed some clear drainage today.  She has been taking her friend's lasix for the past few days and that has helped the swelling.  She did not take any today.  Pt denies sob or cp.   (Consider location/radiation/quality/duration/timing/severity/associated sxs/prior Treatment) The history is provided by the patient.    Past Medical History  Diagnosis Date  . Hypertension   . Arthritis    Past Surgical History  Procedure Laterality Date  . Total knee arthroplasty    . Tubal ligation    . Total knee arthroplasty Bilateral    No family history on file. Social History  Substance Use Topics  . Smoking status: Never Smoker   . Smokeless tobacco: Never Used  . Alcohol Use: No   OB History    No data available     Review of Systems  Musculoskeletal:       Bilateral le leg swelling  All other systems reviewed and are negative.     Allergies  Iodinated diagnostic agents; Codeine; and Diphenhydramine  Home Medications   Prior to Admission medications   Medication Sig Start Date End Date Taking? Authorizing Provider  amLODipine (NORVASC) 10 MG tablet Take 1 tablet by mouth daily. 04/12/13  Yes Historical Provider, MD  aspirin EC 325 MG tablet Take 325-650 mg by mouth daily as needed for mild pain or moderate pain.   Yes Historical Provider, MD  CELEBREX 200 MG capsule Take 1 capsule by mouth daily. 04/25/13  Yes Historical Provider, MD  hydrochlorothiazide (HYDRODIURIL) 25 MG tablet Take 1 tablet by mouth daily. 04/12/13  Yes Historical Provider, MD  lisinopril (PRINIVIL,ZESTRIL) 30 MG tablet Take 1 tablet by mouth daily. 04/16/13  Yes Historical Provider, MD  NEXIUM 40 MG capsule Take 1  capsule by mouth daily. 04/12/13  Yes Historical Provider, MD  potassium chloride (K-DUR) 10 MEQ tablet Take 1 tablet (10 mEq total) by mouth daily. 02/20/14  Yes Noemi Chapel, MD  traMADol (ULTRAM) 50 MG tablet Take 100 mg by mouth daily.  04/16/13  Yes Historical Provider, MD  cyclobenzaprine (FLEXERIL) 10 MG tablet Take 1 tablet (10 mg total) by mouth 3 (three) times daily as needed for muscle spasms. Patient not taking: Reported on 11/28/2015 08/10/15   Sanjuana Kava, MD  furosemide (LASIX) 20 MG tablet Take 1 tablet (20 mg total) by mouth daily. 11/28/15   Isla Pence, MD  HYDROcodone-acetaminophen (NORCO/VICODIN) 5-325 MG tablet Take 1 tablet by mouth every 4 (four) hours as needed for moderate pain (Must last 30 days.  Do not take and drive a car or use machinery.). Patient not taking: Reported on 11/28/2015 07/20/15   Sanjuana Kava, MD   BP 139/64 mmHg  Pulse 67  Temp(Src) 98.3 F (36.8 C) (Oral)  Resp 22  Ht 5\' 1"  (1.549 m)  Wt 210 lb (95.255 kg)  BMI 39.70 kg/m2  SpO2 98% Physical Exam  Constitutional: She is oriented to person, place, and time. She appears well-developed and well-nourished.  HENT:  Head: Normocephalic and atraumatic.  Right Ear: External ear normal.  Left Ear: External ear normal.  Nose: Nose normal.  Mouth/Throat: Oropharynx is clear and moist.  Eyes: Conjunctivae and EOM are normal. Pupils are equal, round, and reactive to light.  Neck: Normal range of motion. Neck supple.  Cardiovascular: Normal rate, regular rhythm, normal heart sounds and intact distal pulses.   Pulmonary/Chest: Effort normal and breath sounds normal.  Abdominal: Soft. Bowel sounds are normal.  Musculoskeletal: She exhibits edema.  Neurological: She is alert and oriented to person, place, and time.  Skin: Skin is warm and dry.  Psychiatric: She has a normal mood and affect. Her behavior is normal. Judgment and thought content normal.  Nursing note and vitals reviewed.   ED Course   Procedures (including critical care time) Labs Review Labs Reviewed  CBC WITH DIFFERENTIAL/PLATELET - Abnormal; Notable for the following:    Hemoglobin 11.4 (*)    HCT 34.4 (*)    All other components within normal limits  COMPREHENSIVE METABOLIC PANEL - Abnormal; Notable for the following:    Glucose, Bld 105 (*)    BUN 21 (*)    Anion gap 3 (*)    All other components within normal limits  TROPONIN I  BRAIN NATRIURETIC PEPTIDE    Imaging Review Dg Chest Portable 1 View  11/28/2015  CLINICAL DATA:  Chest pain EXAM: PORTABLE CHEST 1 VIEW COMPARISON:  February 20, 2014 FINDINGS: The heart size is mildly prominent. The hila, mediastinum, lungs, and pleura are unremarkable with no edema. No pulmonary nodules, masses, or focal infiltrates. IMPRESSION: No active disease. Electronically Signed   By: Dorise Bullion III M.D   On: 11/28/2015 19:31   I have personally reviewed and evaluated these images and lab results as part of my medical decision-making.   EKG Interpretation   Date/Time:  Sunday November 28 2015 18:40:35 EDT Ventricular Rate:  81 PR Interval:    QRS Duration: 112 QT Interval:  375 QTC Calculation: 436 R Axis:   -176 Text Interpretation:  Right and left arm electrode reversal,  interpretation assumes no reversal Sinus rhythm Prominent P waves,  nondiagnostic IRBBB and LPFB Nonspecific T abnormalities, lateral leads  Confirmed by Charlies Rayburn MD, Maleeha Halls ([id number]) on 11/28/2015 6:48:15 PM      MDM  Pt will be started on Lasix.  She knows to avoid salt, eat healthy foods, and to exercise.  She knows to return if worse. Final diagnoses:  Peripheral edema        Isla Pence, MD 11/28/15 2013

## 2015-11-28 NOTE — ED Notes (Addendum)
Patient c/o bilateral leg swelling x2 weeks with clear drainage today. Patient reports swelling improves at night when she elevates them but returns shortly after being up and ambulating. Per patient does not take any type of diuretic. Denies hx of CHF. Denies shortness of breath but does states she "has felt funny in chest at night when she lays down. " Per patient a burning sensation.

## 2016-01-03 ENCOUNTER — Encounter (INDEPENDENT_AMBULATORY_CARE_PROVIDER_SITE_OTHER): Payer: Self-pay

## 2016-01-03 ENCOUNTER — Encounter: Payer: Self-pay | Admitting: Family

## 2016-01-03 ENCOUNTER — Ambulatory Visit (INDEPENDENT_AMBULATORY_CARE_PROVIDER_SITE_OTHER): Payer: Medicare HMO | Admitting: Family

## 2016-01-03 DIAGNOSIS — R600 Localized edema: Secondary | ICD-10-CM | POA: Insufficient documentation

## 2016-01-03 DIAGNOSIS — Z6841 Body Mass Index (BMI) 40.0 and over, adult: Secondary | ICD-10-CM | POA: Diagnosis not present

## 2016-01-03 DIAGNOSIS — R609 Edema, unspecified: Secondary | ICD-10-CM

## 2016-01-03 NOTE — Progress Notes (Signed)
   Subjective:    Patient ID: Melissa Zavala, female    DOB: 1940/07/12, 75 y.o.   MRN: [id number]  HPI Pt presents to the office today to establish care. PT states she is having bilateral leg swelling. PT states she went to the ED on 11/28/15 for this swelling and had a negative chest x-ray. PT was started on lasix 20 mg daily and told to be on low salt diet. Pt states the swelling is unchanged since starting the lasix. PT states the swelling is worse at night and has slight redness.   [facility] notes were reviewed.   Review of Systems  HENT: Negative.   Eyes: Negative.   Respiratory: Negative for choking, chest tightness, shortness of breath and wheezing.   Cardiovascular: Positive for leg swelling. Negative for chest pain and palpitations.  Gastrointestinal: Negative.   Genitourinary: Negative.        Objective:   Physical Exam  Constitutional: She is oriented to person, place, and time. She appears well-developed and well-nourished. No distress.  HENT:  Head: Normocephalic and atraumatic.  Eyes: Pupils are equal, round, and reactive to light.  Neck: Normal range of motion. Neck supple. No thyromegaly present.  Cardiovascular: Normal rate, regular rhythm, normal heart sounds and intact distal pulses.   No murmur heard. Pulmonary/Chest: Effort normal and breath sounds normal. No respiratory distress. She has no wheezes.  Abdominal: Soft. Bowel sounds are normal. She exhibits no distension. There is no tenderness.  Musculoskeletal: Normal range of motion. She exhibits edema (2+ in Bilateral legs). She exhibits no tenderness.  Neurological: She is alert and oriented to person, place, and time. She has normal reflexes. No cranial nerve deficit.  Skin: Skin is warm and dry.  Psychiatric: She has a normal mood and affect. Her behavior is normal. Judgment and thought content normal.  Vitals reviewed.    BP 133/70   Pulse 73   Temp 97.1 F (36.2 C) (Oral)   Ht 5\' 1"  (1.549 m)    Wt 214 lb 9.6 oz (97.3 kg)   BMI 40.55 kg/m       Assessment & Plan:  1. Peripheral edema - Compression stockings  2. Morbid obesity with BMI of 40.0-44.9, adult (HCC) - Compression stockings   Pt to continue lasix daily and told if swelling increases she can take one extra 20  Mg tablet in evening Low salt diet Keep legs elevated when possible Compression hose rx given RTO prn   Evelina Dun, FNP

## 2016-01-03 NOTE — Patient Instructions (Signed)
Edema Edema is an abnormal buildup of fluids in your bodytissues. Edema is somewhatdependent on gravity to pull the fluid to the lowest place in your body. That makes the condition more common in the legs and thighs (lower extremities). Painless swelling of the feet and ankles is common and becomes more likely as you get older. It is also common in looser tissues, like around your eyes.  When the affected area is squeezed, the fluid may move out of that spot and leave a dent for a few moments. This dent is called pitting.  CAUSES  There are many possible causes of edema. Eating too much salt and being on your feet or sitting for a long time can cause edema in your legs and ankles. Hot weather may make edema worse. Common medical causes of edema include:  Heart failure.  Liver disease.  Kidney disease.  Weak blood vessels in your legs.  Cancer.  An injury.  Pregnancy.  Some medications.  Obesity. SYMPTOMS  Edema is usually painless.Your skin may look swollen or shiny.  DIAGNOSIS  Your health care provider may be able to diagnose edema by asking about your medical history and doing a physical exam. You may need to have tests such as X-rays, an electrocardiogram, or blood tests to check for medical conditions that may cause edema.  TREATMENT  Edema treatment depends on the cause. If you have heart, liver, or kidney disease, you need the treatment appropriate for these conditions. General treatment may include:  Elevation of the affected body part above the level of your heart.  Compression of the affected body part. Pressure from elastic bandages or support stockings squeezes the tissues and forces fluid back into the blood vessels. This keeps fluid from entering the tissues.  Restriction of fluid and salt intake.  Use of a water pill (diuretic). These medications are appropriate only for some types of edema. They pull fluid out of your body and make you urinate more often. This  gets rid of fluid and reduces swelling, but diuretics can have side effects. Only use diuretics as directed by your health care provider. HOME CARE INSTRUCTIONS   Keep the affected body part above the level of your heart when you are lying down.   Do not sit still or stand for prolonged periods.   Do not put anything directly under your knees when lying down.  Do not wear constricting clothing or garters on your upper legs.   Exercise your legs to work the fluid back into your blood vessels. This may help the swelling go down.   Wear elastic bandages or support stockings to reduce ankle swelling as directed by your health care provider.   Eat a low-salt diet to reduce fluid if your health care provider recommends it.   Only take medicines as directed by your health care provider. SEEK MEDICAL CARE IF:   Your edema is not responding to treatment.  You have heart, liver, or kidney disease and notice symptoms of edema.  You have edema in your legs that does not improve after elevating them.   You have sudden and unexplained weight gain. SEEK IMMEDIATE MEDICAL CARE IF:   You develop shortness of breath or chest pain.   You cannot breathe when you lie down.  You develop pain, redness, or warmth in the swollen areas.   You have heart, liver, or kidney disease and suddenly get edema.  You have a fever and your symptoms suddenly get worse. MAKE SURE YOU:     Understand these instructions. °· Will watch your condition. °· Will get help right away if you are not doing well or get worse. °  °This information is not intended to replace advice given to you by your health care provider. Make sure you discuss any questions you have with your health care provider. °  °Document Released: 05/01/2005 Document Revised: 05/22/2014 Document Reviewed: 02/21/2013 °Elsevier Interactive Patient Education ©2016 Elsevier Inc. ° °Peripheral Edema °You have swelling in your legs (peripheral edema).  This swelling is due to excess accumulation of salt and water in your body. Edema may be a sign of heart, kidney or liver disease, or a side effect of a medication. It may also be due to problems in the leg veins. Elevating your legs and using special support stockings may be very helpful, if the cause of the swelling is due to poor venous circulation. Avoid long periods of standing, whatever the cause. °Treatment of edema depends on identifying the cause. Chips, pretzels, pickles and other salty foods should be avoided. Restricting salt in your diet is almost always needed. Water pills (diuretics) are often used to remove the excess salt and water from your body via urine. These medicines prevent the kidney from reabsorbing sodium. This increases urine flow. °Diuretic treatment may also result in lowering of potassium levels in your body. Potassium supplements may be needed if you have to use diuretics daily. Daily weights can help you keep track of your progress in clearing your edema. You should call your caregiver for follow up care as recommended. °SEEK IMMEDIATE MEDICAL CARE IF:  °· You have increased swelling, pain, redness, or heat in your legs. °· You develop shortness of breath, especially when lying down. °· You develop chest or abdominal pain, weakness, or fainting. °· You have a fever. °  °This information is not intended to replace advice given to you by your health care provider. Make sure you discuss any questions you have with your health care provider. °  °Document Released: 06/08/2004 Document Revised: 07/24/2011 Document Reviewed: 11/11/2014 °Elsevier Interactive Patient Education ©2016 Elsevier Inc. ° °

## 2016-02-14 ENCOUNTER — Emergency Department (HOSPITAL_COMMUNITY): Payer: Medicare HMO

## 2016-02-14 ENCOUNTER — Encounter (HOSPITAL_COMMUNITY): Payer: Self-pay

## 2016-02-14 ENCOUNTER — Emergency Department (HOSPITAL_COMMUNITY)
Admission: EM | Admit: 2016-02-14 | Discharge: 2016-02-14 | Disposition: A | Discharge status: Home / Self Care | Payer: Medicare HMO | Attending: Emergency Medicine | Admitting: Emergency Medicine

## 2016-02-14 DIAGNOSIS — I10 Essential (primary) hypertension: Secondary | ICD-10-CM | POA: Diagnosis not present

## 2016-02-14 DIAGNOSIS — Z79899 Other long term (current) drug therapy: Secondary | ICD-10-CM | POA: Diagnosis not present

## 2016-02-14 DIAGNOSIS — R072 Precordial pain: Secondary | ICD-10-CM | POA: Diagnosis present

## 2016-02-14 DIAGNOSIS — R079 Chest pain, unspecified: Secondary | ICD-10-CM | POA: Diagnosis not present

## 2016-02-14 DIAGNOSIS — Z7982 Long term (current) use of aspirin: Secondary | ICD-10-CM | POA: Insufficient documentation

## 2016-02-14 LAB — CBC WITH DIFFERENTIAL/PLATELET
Basophils Absolute: 0 10*3/uL (ref 0.0–0.1)
Basophils Relative: 0 %
EOS PCT: 3 %
Eosinophils Absolute: 0.3 10*3/uL (ref 0.0–0.7)
HCT: 34.2 % — ABNORMAL LOW (ref 36.0–46.0)
Hemoglobin: 11.3 g/dL — ABNORMAL LOW (ref 12.0–15.0)
LYMPHS ABS: 4 10*3/uL (ref 0.7–4.0)
LYMPHS PCT: 35 %
MCH: 26.8 pg (ref 26.0–34.0)
MCHC: 33 g/dL (ref 30.0–36.0)
MCV: 81 fL (ref 78.0–100.0)
MONO ABS: 0.7 10*3/uL (ref 0.1–1.0)
MONOS PCT: 6 %
Neutro Abs: 6.2 10*3/uL (ref 1.7–7.7)
Neutrophils Relative %: 56 %
Platelets: 267 10*3/uL (ref 150–400)
RBC: 4.22 MIL/uL (ref 3.87–5.11)
RDW: 14 % (ref 11.5–15.5)
WBC: 11.2 10*3/uL — ABNORMAL HIGH (ref 4.0–10.5)

## 2016-02-14 LAB — BASIC METABOLIC PANEL
Anion gap: 6 (ref 5–15)
BUN: 12 mg/dL (ref 6–20)
CHLORIDE: 108 mmol/L (ref 101–111)
CO2: 25 mmol/L (ref 22–32)
CREATININE: 0.76 mg/dL (ref 0.44–1.00)
Calcium: 9.8 mg/dL (ref 8.9–10.3)
GFR calc Af Amer: >60 mL/min (ref >60)
GFR calc non Af Amer: >60 mL/min (ref >60)
Glucose, Bld: 100 mg/dL — ABNORMAL HIGH (ref 65–99)
POTASSIUM: 3.8 mmol/L (ref 3.5–5.1)
SODIUM: 139 mmol/L (ref 135–145)

## 2016-02-14 LAB — BRAIN NATRIURETIC PEPTIDE: B Natriuretic Peptide: 67 pg/mL (ref 0.0–100.0)

## 2016-02-14 LAB — TROPONIN I: Troponin I: <0.03 ng/mL (ref <0.03)

## 2016-02-14 MED ORDER — KETOROLAC TROMETHAMINE 30 MG/ML IJ SOLN
10.0000 mg | Freq: Once | INTRAMUSCULAR | Status: AC
  Administered 2016-02-14: 9.9 mg via INTRAVENOUS
  Filled 2016-02-14: qty 1

## 2016-02-14 MED ORDER — FENTANYL CITRATE (PF) 100 MCG/2ML IJ SOLN
50.0000 ug | Freq: Once | INTRAMUSCULAR | Status: AC
  Administered 2016-02-14: 50 ug via INTRAVENOUS
  Filled 2016-02-14: qty 2

## 2016-02-14 MED ORDER — ASPIRIN 325 MG PO TABS
325.0000 mg | ORAL_TABLET | Freq: Once | ORAL | Status: AC
  Administered 2016-02-14: 325 mg via ORAL
  Filled 2016-02-14: qty 1

## 2016-02-14 NOTE — ED Notes (Signed)
Patient transported to X-ray 

## 2016-02-14 NOTE — ED Triage Notes (Signed)
Pt reports chest pain and fatigue that started yesterday afternoon.  Pt says she took something over the counter for indigestion but it didn't help.  Pt describes pain as a "deep, sharp pain."  Pt also says pain radiates through to her back.  Denies n/v.

## 2016-02-14 NOTE — ED Provider Notes (Addendum)
[facility] DEPT [facility] Provider Note   CSN: [id number] Arrival date & time: 02/14/16  1829     History   Chief Complaint Chief Complaint  Patient presents with  . Chest Pain    HPI Melissa Zavala is a 75 y.o. female.  The history is provided by the patient, medical records and a relative.  Chest Pain   This is a new problem. The current episode started yesterday. The problem occurs constantly. The problem has been gradually improving. The pain is present in the substernal region. The pain is at a severity of 5/10. The pain is mild. The quality of the pain is described as brief and stabbing. The symptoms are aggravated by certain positions. Pertinent negatives include no nausea and no shortness of breath. Risk factors include female gender.    Past Medical History:  Diagnosis Date  . Arthritis   . Hypertension     Patient Active Problem List   Diagnosis Date Noted  . Peripheral edema 01/03/2016  . Morbid obesity with BMI of 40.0-44.9, adult ([place]) 01/03/2016  . Right shoulder pain 07/06/2015    Past Surgical History:  Procedure Laterality Date  . TOTAL KNEE ARTHROPLASTY    . TOTAL KNEE ARTHROPLASTY Bilateral   . TUBAL LIGATION      OB History    No data available       Home Medications    Prior to Admission medications   Medication Sig Start Date End Date Taking? Authorizing Provider  amLODipine (NORVASC) 10 MG tablet Take 1 tablet by mouth daily. 04/12/13  Yes Historical Provider, MD  aspirin EC 325 MG tablet Take 325-650 mg by mouth daily as needed for mild pain or moderate pain.   Yes Historical Provider, MD  CELEBREX 200 MG capsule Take 1 capsule by mouth daily. 04/25/13  Yes Historical Provider, MD  Dexlansoprazole (DEXILANT) 30 MG capsule Take 30 mg by mouth daily.   Yes Historical Provider, MD  furosemide (LASIX) 20 MG tablet Take 1 tablet (20 mg total) by mouth daily. 11/28/15  Yes Isla Pence, MD  lisinopril (PRINIVIL,ZESTRIL) 30 MG tablet Take 1  tablet by mouth daily. 04/16/13  Yes Historical Provider, MD  potassium chloride (K-DUR) 10 MEQ tablet Take 1 tablet (10 mEq total) by mouth daily. 02/20/14  Yes Noemi Chapel, MD  traMADol (ULTRAM) 50 MG tablet Take 100 mg by mouth daily.  04/16/13  Yes Historical Provider, MD    Family History Family History  Problem Relation Age of Onset  . Cancer Mother     Breast    Social History Social History  Substance Use Topics  . Smoking status: Never Smoker  . Smokeless tobacco: Never Used  . Alcohol use No     Allergies   Iodinated diagnostic agents; Codeine; and Diphenhydramine   Review of Systems Review of Systems  Eyes: Negative for photophobia and pain.  Respiratory: Negative for shortness of breath.   Cardiovascular: Positive for chest pain.  Gastrointestinal: Negative for nausea.  All other systems reviewed and are negative.    Physical Exam Updated Vital Signs BP 148/71   Pulse 70   Temp 98 F (36.7 C) (Oral)   Resp 19   Ht 5\' 3"  (1.6 m)   Wt 200 lb (90.7 kg)   SpO2 98%   BMI 35.43 kg/m   Physical Exam  Constitutional: She is oriented to person, place, and time. She appears well-developed and well-nourished. No distress.  HENT:  Head: Normocephalic and atraumatic.  Eyes:  Conjunctivae are normal.  Neck: Neck supple.  Cardiovascular: Normal rate and regular rhythm.   No murmur heard. Pulmonary/Chest: Effort normal and breath sounds normal. No respiratory distress.  Abdominal: Soft. There is no tenderness.  Musculoskeletal: She exhibits no edema.  Neurological: She is alert and oriented to person, place, and time. No cranial nerve deficit.  Skin: Skin is warm and dry.  Psychiatric: She has a normal mood and affect.  Nursing note and vitals reviewed.    ED Treatments / Results  Labs (all labs ordered are listed, but only abnormal results are displayed) Labs Reviewed  CBC WITH DIFFERENTIAL/PLATELET - Abnormal; Notable for the following:       Result  Value   WBC 11.2 (*)    Hemoglobin 11.3 (*)    HCT 34.2 (*)    All other components within normal limits  BASIC METABOLIC PANEL - Abnormal; Notable for the following:    Glucose, Bld 100 (*)    All other components within normal limits  TROPONIN I  BRAIN NATRIURETIC PEPTIDE  TROPONIN I    EKG  EKG Interpretation  Date/Time:  Monday February 14 2016 21:34:49 EDT Ventricular Rate:  69 PR Interval:    QRS Duration: 110 QT Interval:  404 QTC Calculation: 433 R Axis:   -6 Text Interpretation:  Sinus rhythm Probable left ventricular hypertrophy Nonspecific ST and T wave abnormality Confirmed by ZACKOWSKI  MD, SCOTT [phone]) on 02/15/2016 12:08:44 PM       Radiology Dg Chest 2 View  Result Date: 02/14/2016 CLINICAL DATA:  Chest pain and fatigue since yesterday afternoon. EXAM: CHEST  2 VIEW COMPARISON:  11/28/2015. FINDINGS: Stable enlarged cardiac silhouette and mildly prominent pulmonary vasculature and interstitial markings. Diffuse osteopenia. Mild thoracolumbar spine degenerative changes. IMPRESSION: No acute abnormality. Stable mild cardiomegaly, mild pulmonary vascular congestion and mild chronic interstitial lung disease. Electronically Signed   By: Claudie Revering M.D.   On: 02/14/2016 19:23    Procedures Procedures (including critical care time)  Medications Ordered in ED Medications  aspirin tablet 325 mg (325 mg Oral Given 02/14/16 1908)  fentaNYL (SUBLIMAZE) injection 50 mcg (50 mcg Intravenous Given 02/14/16 2016)  ketorolac (TORADOL) 30 MG/ML injection 9.9 mg (9.9 mg Intravenous Given 02/14/16 2200)     Initial Impression / Assessment and Plan / ED Course  I have reviewed the triage vital signs and the nursing notes.  Pertinent labs & imaging results that were available during my care of the patient were reviewed by me and considered in my medical decision making (see chart for details).  Clinical Course    75 year old female here with intermittent chest pain. Very  atypical for ACS delta troponins negative and 0 EKGs without changes. Possibly GI related. I doubt she has a PE or dissection based on the history and the imaging as resulted. Symptoms continually improving even while in the emergency department so we'll plan for PCP follow-up.  Final Clinical Impressions(s) / ED Diagnoses   Final diagnoses:  Nonspecific chest pain    New Prescriptions Discharge Medication List as of 02/14/2016 10:49 PM        Merrily Pew, MD 02/15/16 1453

## 2016-02-14 NOTE — ED Notes (Signed)
MD at bedside. 

## 2016-04-11 ENCOUNTER — Encounter: Payer: Self-pay | Admitting: Nurse Practitioner

## 2016-04-11 ENCOUNTER — Ambulatory Visit (INDEPENDENT_AMBULATORY_CARE_PROVIDER_SITE_OTHER): Payer: Medicare HMO | Admitting: Nurse Practitioner

## 2016-04-11 VITALS — BP 143/71 | HR 76 | Temp 97.5°F | Ht 63.0 in | Wt 214.0 lb

## 2016-04-11 DIAGNOSIS — J0101 Acute recurrent maxillary sinusitis: Secondary | ICD-10-CM | POA: Diagnosis not present

## 2016-04-11 MED ORDER — AMOXICILLIN-POT CLAVULANATE 875-125 MG PO TABS: 1.0000 | ORAL_TABLET | Freq: Two times a day (BID) | ORAL | 0 refills | Status: AC

## 2016-04-11 NOTE — Patient Instructions (Signed)

## 2016-04-11 NOTE — Progress Notes (Signed)
Subjective:     Melissa Zavala is a 75 y.o. female who presents for evaluation of sinus pain. Symptoms include: congestion, facial pain, headaches, nasal congestion, sinus pressure and sore throat. Onset of symptoms was 5 days ago. Symptoms have been gradually worsening since that time. Past history is significant for occasional episodes of bronchitis. Patient is a non-smoker.  The following portions of the patient's history were reviewed and updated as appropriate: allergies, current medications, past family history, past medical history, past social history, past surgical history and problem list.  Review of Systems Pertinent items noted in HPI and remainder of comprehensive ROS otherwise negative.   Objective:    BP (!) 143/71   Pulse 76   Temp 97.5 F (36.4 C) (Oral)   Ht 5\' 3"  (1.6 m)   Wt 214 lb (97.1 kg)   BMI 37.91 kg/m  General appearance: alert and cooperative Eyes: conjunctivae/corneas clear. PERRL, EOM's intact. Fundi benign. Ears: normal TM's and external ear canals both ears Nose: clear discharge, moderate congestion, turbinates red, sinus tenderness bilateral Throat: lips, mucosa, and tongue normal; teeth and gums normal Neck: no adenopathy, no carotid bruit, no JVD, supple, symmetrical, trachea midline and thyroid not enlarged, symmetric, no tenderness/mass/nodules Lungs: clear to auscultation bilaterally and dry cough Heart: regular rate and rhythm, S1, S2 normal, no murmur, click, rub or gallop    Assessment:    Acute bacterial sinusitis.    Plan:   1. Take meds as prescribed 2. Use a cool mist humidifier especially during the winter months and when heat has been humid. 3. Use saline nose sprays frequently 4. Saline irrigations of the nose can be very helpful if done frequently.  * 4X daily for 1 week*  * Use of a nettie pot can be helpful with this. Follow directions with this* 5. Drink plenty of fluids 6. Keep thermostat turn down low 7.For any cough or  congestion  Use plain Mucinex- regular strength or max strength is fine   * Children- consult with Pharmacist for dosing 8. For fever or aces or pains- take tylenol or ibuprofen appropriate for age and weight.  * for fevers greater than 101 orally you may alternate ibuprofen and tylenol every  3 hours.   Meds ordered this encounter  Medications  . amoxicillin-clavulanate (AUGMENTIN) 875-125 MG tablet    Sig: Take 1 tablet by mouth 2 (two) times daily.    Dispense:  20 tablet    Refill:  0    Order Specific Question:   Supervising Provider    Answer:   Eustaquio Maize [[id number]]   Neelah-Margaret Hassell Done, FNP

## 2016-04-20 ENCOUNTER — Telehealth: Payer: Self-pay | Admitting: Nurse Practitioner

## 2016-06-08 ENCOUNTER — Other Ambulatory Visit: Payer: Self-pay | Admitting: Nurse Practitioner

## 2016-06-09 ENCOUNTER — Ambulatory Visit (INDEPENDENT_AMBULATORY_CARE_PROVIDER_SITE_OTHER): Payer: Medicare HMO | Admitting: Physician Assistant

## 2016-06-09 ENCOUNTER — Encounter: Payer: Self-pay | Admitting: Physician Assistant

## 2016-06-09 VITALS — BP 130/73 | HR 85 | Temp 97.1°F | Ht 63.0 in | Wt 211.0 lb

## 2016-06-09 DIAGNOSIS — Z1231 Encounter for screening mammogram for malignant neoplasm of breast: Secondary | ICD-10-CM

## 2016-06-09 DIAGNOSIS — Z1239 Encounter for other screening for malignant neoplasm of breast: Secondary | ICD-10-CM

## 2016-06-09 DIAGNOSIS — K219 Gastro-esophageal reflux disease without esophagitis: Secondary | ICD-10-CM

## 2016-06-09 DIAGNOSIS — Z8742 Personal history of other diseases of the female genital tract: Secondary | ICD-10-CM

## 2016-06-09 DIAGNOSIS — M25552 Pain in left hip: Secondary | ICD-10-CM

## 2016-06-09 DIAGNOSIS — Z23 Encounter for immunization: Secondary | ICD-10-CM

## 2016-06-09 DIAGNOSIS — G5603 Carpal tunnel syndrome, bilateral upper limbs: Secondary | ICD-10-CM

## 2016-06-09 DIAGNOSIS — R600 Localized edema: Secondary | ICD-10-CM

## 2016-06-09 DIAGNOSIS — M150 Primary generalized (osteo)arthritis: Secondary | ICD-10-CM | POA: Diagnosis not present

## 2016-06-09 DIAGNOSIS — M159 Polyosteoarthritis, unspecified: Secondary | ICD-10-CM | POA: Insufficient documentation

## 2016-06-09 DIAGNOSIS — R609 Edema, unspecified: Secondary | ICD-10-CM

## 2016-06-09 DIAGNOSIS — M810 Age-related osteoporosis without current pathological fracture: Secondary | ICD-10-CM

## 2016-06-09 MED ORDER — TRAMADOL HCL 50 MG PO TABS: 100.0000 mg | ORAL_TABLET | Freq: Every day | ORAL | 5 refills | Status: DC

## 2016-06-09 MED ORDER — FUROSEMIDE 20 MG PO TABS: 20.0000 mg | ORAL_TABLET | Freq: Every day | ORAL | 11 refills | Status: DC

## 2016-06-09 NOTE — Patient Instructions (Signed)
Carpal Tunnel Syndrome Introduction Carpal tunnel syndrome is a condition that causes pain in your hand and arm. The carpal tunnel is a narrow area that is on the palm side of your wrist. Repeated wrist motion or certain diseases may cause swelling in the tunnel. This swelling can pinch the main nerve in the wrist (median nerve). Follow these instructions at home: If you have a splint:  Wear it as told by your doctor. Remove it only as told by your doctor.  Loosen the splint if your fingers:  Become numb and tingle.  Turn blue and cold.  Keep the splint clean and dry. General instructions  Take over-the-counter and prescription medicines only as told by your doctor.  Rest your wrist from any activity that may be causing your pain. If needed, talk to your employer about changes that can be made in your work, such as getting a wrist pad to use while typing.  If directed, apply ice to the painful area:  Put ice in a plastic bag.  Place a towel between your skin and the bag.  Leave the ice on for 20 minutes, 2-3 times per day.  Keep all follow-up visits as told by your doctor. This is important.  Do any exercises as told by your doctor, physical therapist, or occupational therapist. Contact a doctor if:  You have new symptoms.  Medicine does not help your pain.  Your symptoms get worse. This information is not intended to replace advice given to you by your health care provider. Make sure you discuss any questions you have with your health care provider. Document Released: 04/20/2011 Document Revised: 10/07/2015 Document Reviewed: 09/16/2014  2017 Elsevier

## 2016-06-11 DIAGNOSIS — M25552 Pain in left hip: Secondary | ICD-10-CM | POA: Insufficient documentation

## 2016-06-11 DIAGNOSIS — Z1239 Encounter for other screening for malignant neoplasm of breast: Secondary | ICD-10-CM | POA: Insufficient documentation

## 2016-06-11 DIAGNOSIS — Z8742 Personal history of other diseases of the female genital tract: Secondary | ICD-10-CM | POA: Insufficient documentation

## 2016-06-11 DIAGNOSIS — K219 Gastro-esophageal reflux disease without esophagitis: Secondary | ICD-10-CM | POA: Insufficient documentation

## 2016-06-11 DIAGNOSIS — M810 Age-related osteoporosis without current pathological fracture: Secondary | ICD-10-CM | POA: Insufficient documentation

## 2016-06-11 NOTE — Progress Notes (Signed)
BP 130/73   Pulse 85   Temp 97.1 F (36.2 C) (Oral)   Ht 5\' 3"  (1.6 m)   Wt 211 lb (95.7 kg)   BMI 37.38 kg/m    Subjective:    Patient ID: Melissa Zavala, female    DOB: 04/03/1941, 76 y.o.   MRN: [id number]  Melissa Zavala is a 76 y.o. female presenting on 06/09/2016 for Medication Refill and Abdominal Pain  HPI Patient here to be established as new patient at [facility].  This patient is known to me from [facility]. She's had long-standing issues with hypertension, osteoarthritis, history of ovarian cysts. She has had a flareup of this and would like to get back to her gynecologist. She also has long-standing GERD. All of her medications are reviewed and will be refilled today as needed. We will review her records when they come. We have also discussed that she needs to have a mammogram and DEXA performed.  Past Medical History:  Diagnosis Date  . Arthritis   . Hypertension    Relevant past medical, surgical, family and social history reviewed and updated as indicated. Interim medical history since our last visit reviewed. Allergies and medications reviewed and updated.   Data reviewed from any sources in EPIC.  Review of Systems  Constitutional: Negative.  Negative for activity change, fatigue and fever.  HENT: Negative.   Eyes: Negative.   Respiratory: Negative.  Negative for cough.   Cardiovascular: Negative.  Negative for chest pain.  Gastrointestinal: Negative.  Negative for abdominal pain.  Endocrine: Negative.   Genitourinary: Positive for pelvic pain. Negative for difficulty urinating, dysuria, flank pain, frequency, urgency and vaginal pain.  Musculoskeletal: Positive for joint swelling.  Skin: Negative.   Neurological: Positive for numbness.     Social History   Social History  . Marital status: Legally Separated    Spouse name: N/A  . Number of children: N/A  . Years of education: N/A   Occupational History  . Not  on file.   Social History Main Topics  . Smoking status: Never Smoker  . Smokeless tobacco: Never Used  . Alcohol use No  . Drug use: No  . Sexual activity: Not Currently   Other Topics Concern  . Not on file   Social History Narrative  . No narrative on file    Past Surgical History:  Procedure Laterality Date  . TOTAL KNEE ARTHROPLASTY    . TOTAL KNEE ARTHROPLASTY Bilateral   . TUBAL LIGATION      Family History  Problem Relation Age of Onset  . Cancer Mother     Breast    Allergies as of 06/09/2016      Reactions   Iodinated Diagnostic Agents Anaphylaxis   Codeine Other (See Comments)   Hallucinations   Diphenhydramine Other (See Comments)   Patient states "feels like somebody is inside of me clawing me out"      Medication List       Accurate as of 06/09/16 11:59 PM. Always use your most recent med list.          amLODipine 10 MG tablet Commonly known as:  NORVASC Take 1 tablet by mouth daily.   CELEBREX 200 MG capsule Generic drug:  celecoxib Take 1 capsule by mouth daily.   esomeprazole 40 MG capsule Commonly known as:  NEXIUM Take 40 mg by mouth daily at 12 noon.   furosemide 20 MG tablet Commonly known as:  LASIX  Take 1 tablet (20 mg total) by mouth daily.   lisinopril 30 MG tablet Commonly known as:  PRINIVIL,ZESTRIL Take 1 tablet by mouth daily.   potassium chloride 10 MEQ tablet Commonly known as:  K-DUR Take 1 tablet (10 mEq total) by mouth daily.   traMADol 50 MG tablet Commonly known as:  ULTRAM Take 2 tablets (100 mg total) by mouth daily.          Objective:    BP 130/73   Pulse 85   Temp 97.1 F (36.2 C) (Oral)   Ht 5\' 3"  (1.6 m)   Wt 211 lb (95.7 kg)   BMI 37.38 kg/m   Allergies  Allergen Reactions  . Iodinated Diagnostic Agents Anaphylaxis  . Codeine Other (See Comments)    Hallucinations  . Diphenhydramine Other (See Comments)    Patient states "feels like somebody is inside of me clawing me out"   Wt  Readings from Last 3 Encounters:  06/09/16 211 lb (95.7 kg)  04/11/16 214 lb (97.1 kg)  02/14/16 200 lb (90.7 kg)    Physical Exam  Constitutional: She is oriented to person, place, and time. She appears well-developed and well-nourished.  HENT:  Head: Normocephalic and atraumatic.  Eyes: Conjunctivae and EOM are normal. Pupils are equal, round, and reactive to light.  Cardiovascular: Normal rate, regular rhythm, normal heart sounds and intact distal pulses.   Pulmonary/Chest: Effort normal and breath sounds normal.  Abdominal: Soft. Bowel sounds are normal.  Musculoskeletal: She exhibits edema and tenderness.  Neurological: She is alert and oriented to person, place, and time. She has normal reflexes.  Skin: Skin is warm and dry. No rash noted.  Psychiatric: She has a normal mood and affect. Her behavior is normal. Judgment and thought content normal.        Assessment & Plan:   1. Primary osteoarthritis involving multiple joints - traMADol (ULTRAM) 50 MG tablet; Take 2 tablets (100 mg total) by mouth daily.  Dispense: 60 tablet; Refill: 5  2. Bilateral carpal tunnel syndrome  3. Peripheral edema - furosemide (LASIX) 20 MG tablet; Take 1 tablet (20 mg total) by mouth daily.  Dispense: 30 tablet; Refill: 11  4. Encounter for immunization - esomeprazole (NEXIUM) 40 MG capsule; Take 40 mg by mouth daily at 12 noon. - traMADol (ULTRAM) 50 MG tablet; Take 2 tablets (100 mg total) by mouth daily.  Dispense: 60 tablet; Refill: 5 - furosemide (LASIX) 20 MG tablet; Take 1 tablet (20 mg total) by mouth daily.  Dispense: 30 tablet; Refill: 11 - Flu vaccine HIGH DOSE PF  5. Gastroesophageal reflux disease without esophagitis - esomeprazole (NEXIUM) 40 MG capsule; Take 40 mg by mouth daily at 12 noon.   Continue all other maintenance medications as listed above. Educational handout given for carpal tunnel  Follow up plan: Return in about 6 months (around 12/07/2016).  Terald Sleeper  PA-C [place] [address]  [place], [place] [address] [phone]   06/11/2016, 10:09 PM

## 2016-07-18 ENCOUNTER — Emergency Department (HOSPITAL_COMMUNITY): Payer: Medicare HMO

## 2016-07-18 ENCOUNTER — Emergency Department (HOSPITAL_COMMUNITY)
Admission: EM | Admit: 2016-07-18 | Discharge: 2016-07-18 | Disposition: A | Discharge status: Home / Self Care | Payer: Medicare HMO | Attending: Emergency Medicine | Admitting: Emergency Medicine

## 2016-07-18 ENCOUNTER — Encounter (HOSPITAL_COMMUNITY): Payer: Self-pay | Admitting: Emergency Medicine

## 2016-07-18 DIAGNOSIS — Y999 Unspecified external cause status: Secondary | ICD-10-CM | POA: Insufficient documentation

## 2016-07-18 DIAGNOSIS — Y92002 Bathroom of unspecified non-institutional (private) residence single-family (private) house as the place of occurrence of the external cause: Secondary | ICD-10-CM | POA: Insufficient documentation

## 2016-07-18 DIAGNOSIS — W01198A Fall on same level from slipping, tripping and stumbling with subsequent striking against other object, initial encounter: Secondary | ICD-10-CM | POA: Diagnosis not present

## 2016-07-18 DIAGNOSIS — Z23 Encounter for immunization: Secondary | ICD-10-CM | POA: Insufficient documentation

## 2016-07-18 DIAGNOSIS — S161XXA Strain of muscle, fascia and tendon at neck level, initial encounter: Secondary | ICD-10-CM | POA: Insufficient documentation

## 2016-07-18 DIAGNOSIS — S20219D Contusion of unspecified front wall of thorax, subsequent encounter: Secondary | ICD-10-CM

## 2016-07-18 DIAGNOSIS — I10 Essential (primary) hypertension: Secondary | ICD-10-CM | POA: Diagnosis not present

## 2016-07-18 DIAGNOSIS — Y9301 Activity, walking, marching and hiking: Secondary | ICD-10-CM | POA: Diagnosis not present

## 2016-07-18 DIAGNOSIS — Z79899 Other long term (current) drug therapy: Secondary | ICD-10-CM | POA: Diagnosis not present

## 2016-07-18 DIAGNOSIS — S0181XA Laceration without foreign body of other part of head, initial encounter: Secondary | ICD-10-CM | POA: Diagnosis not present

## 2016-07-18 DIAGNOSIS — S20219A Contusion of unspecified front wall of thorax, initial encounter: Secondary | ICD-10-CM | POA: Insufficient documentation

## 2016-07-18 DIAGNOSIS — S0990XA Unspecified injury of head, initial encounter: Secondary | ICD-10-CM | POA: Diagnosis present

## 2016-07-18 LAB — COMPREHENSIVE METABOLIC PANEL
ALBUMIN: 4 g/dL (ref 3.5–5.0)
ALT: 14 U/L (ref 14–54)
ANION GAP: 8 (ref 5–15)
AST: 19 U/L (ref 15–41)
Alkaline Phosphatase: 64 U/L (ref 38–126)
BUN: 17 mg/dL (ref 6–20)
CHLORIDE: 102 mmol/L (ref 101–111)
CO2: 26 mmol/L (ref 22–32)
CREATININE: 0.78 mg/dL (ref 0.44–1.00)
Calcium: 9.6 mg/dL (ref 8.9–10.3)
GFR calc non Af Amer: >60 mL/min (ref >60)
GLUCOSE: 107 mg/dL — AB (ref 65–99)
Potassium: 3.5 mmol/L (ref 3.5–5.1)
SODIUM: 136 mmol/L (ref 135–145)
Total Bilirubin: 0.4 mg/dL (ref 0.3–1.2)
Total Protein: 7.2 g/dL (ref 6.5–8.1)

## 2016-07-18 LAB — CBC WITH DIFFERENTIAL/PLATELET
BASOS ABS: 0 10*3/uL (ref 0.0–0.1)
BASOS PCT: 0 %
EOS ABS: 0.2 10*3/uL (ref 0.0–0.7)
EOS PCT: 2 %
HCT: 36.7 % (ref 36.0–46.0)
HEMOGLOBIN: 12.2 g/dL (ref 12.0–15.0)
LYMPHS ABS: 2 10*3/uL (ref 0.7–4.0)
Lymphocytes Relative: 20 %
MCH: 27.1 pg (ref 26.0–34.0)
MCHC: 33.2 g/dL (ref 30.0–36.0)
MCV: 81.4 fL (ref 78.0–100.0)
Monocytes Absolute: 0.6 10*3/uL (ref 0.1–1.0)
Monocytes Relative: 6 %
NEUTROS PCT: 72 %
Neutro Abs: 7.2 10*3/uL (ref 1.7–7.7)
PLATELETS: 258 10*3/uL (ref 150–400)
RBC: 4.51 MIL/uL (ref 3.87–5.11)
RDW: 14 % (ref 11.5–15.5)
WBC: 10 10*3/uL (ref 4.0–10.5)

## 2016-07-18 MED ORDER — TETANUS-DIPHTH-ACELL PERTUSSIS 5-2.5-18.5 LF-MCG/0.5 IM SUSP
0.5000 mL | Freq: Once | INTRAMUSCULAR | Status: AC
  Administered 2016-07-18: 0.5 mL via INTRAMUSCULAR
  Filled 2016-07-18: qty 0.5

## 2016-07-18 MED ORDER — FENTANYL CITRATE (PF) 100 MCG/2ML IJ SOLN
50.0000 ug | Freq: Once | INTRAMUSCULAR | Status: AC
  Administered 2016-07-18: 50 ug via INTRAVENOUS
  Filled 2016-07-18: qty 2

## 2016-07-18 MED ORDER — LIDOCAINE HCL (PF) 1 % IJ SOLN
INTRAMUSCULAR | Status: AC
  Filled 2016-07-18: qty 5

## 2016-07-18 MED ORDER — HYDROCODONE-ACETAMINOPHEN 5-325 MG PO TABS: 2.0000 | ORAL_TABLET | Freq: Four times a day (QID) | ORAL | 0 refills | Status: DC | PRN

## 2016-07-18 MED ORDER — HYDROCODONE-ACETAMINOPHEN 5-325 MG PO TABS: 1.0000 | ORAL_TABLET | Freq: Once | ORAL | Status: DC

## 2016-07-18 NOTE — ED Provider Notes (Signed)
[facility] DEPT [facility] Provider Note   CSN: [id number] Arrival date & time: 07/18/16  1204   By signing my name below, I, Evelene Croon, attest that this documentation has been prepared under the direction and in the presence of Julianne Rice, MD . Electronically Signed: Evelene Croon, Scribe. 07/18/2016. 12:35 PM.   History   Chief Complaint Chief Complaint  Patient presents with  . Fall    The history is provided by the patient. No language interpreter was used.    HPI Comments:  Melissa Zavala is a 76 y.o. female who presents to the Emergency Department via EMS complaining of constant pain to her lips/mouth, neck, and chest s/p fall this AM. Pt states she tripped over the rug and fell forward striking her face on the tub; denies LOC. Pt notes associated HA since the fall.  She denies use of anticoagulants. Tetanus status is unknown. Pt states she felt fine prior to the fall.    Past Medical History:  Diagnosis Date  . Arthritis   . Hypertension     Patient Active Problem List   Diagnosis Date Noted  . Gastroesophageal reflux disease without esophagitis 06/11/2016  . Personal history of ovarian cyst 06/11/2016  . Pain in joint involving left pelvic region and thigh 06/11/2016  . Age-related osteoporosis without current pathological fracture 06/11/2016  . Screening for breast cancer 06/11/2016  . Bilateral carpal tunnel syndrome 06/09/2016  . Primary osteoarthritis involving multiple joints 06/09/2016  . Peripheral edema 01/03/2016  . Morbid obesity with BMI of 40.0-44.9, adult ([place]) 01/03/2016  . Right shoulder pain 07/06/2015    Past Surgical History:  Procedure Laterality Date  . TOTAL KNEE ARTHROPLASTY    . TOTAL KNEE ARTHROPLASTY Bilateral   . TUBAL LIGATION      OB History    No data available       Home Medications    Prior to Admission medications   Medication Sig Start Date End Date Taking? Authorizing Provider  amLODipine (NORVASC) 10 MG  tablet Take 1 tablet by mouth daily. 04/12/13  Yes Historical Provider, MD  CELEBREX 200 MG capsule Take 1 capsule by mouth daily. 04/25/13  Yes Historical Provider, MD  esomeprazole (NEXIUM) 40 MG capsule Take 40 mg by mouth daily at 12 noon.   Yes Historical Provider, MD  furosemide (LASIX) 20 MG tablet Take 1 tablet (20 mg total) by mouth daily. 06/09/16  Yes Terald Sleeper, PA-C  hydrochlorothiazide (HYDRODIURIL) 25 MG tablet Take 25 mg by mouth daily.  07/13/16  Yes Historical Provider, MD  lisinopril (PRINIVIL,ZESTRIL) 30 MG tablet Take 1 tablet by mouth daily. 04/16/13  Yes Historical Provider, MD  potassium chloride (K-DUR) 10 MEQ tablet Take 1 tablet (10 mEq total) by mouth daily. 02/20/14  Yes Noemi Chapel, MD  traMADol (ULTRAM) 50 MG tablet Take 2 tablets (100 mg total) by mouth daily. 06/09/16  Yes Terald Sleeper, PA-C  HYDROcodone-acetaminophen (NORCO) 5-325 MG tablet Take 2 tablets by mouth every 6 (six) hours as needed for severe pain. 07/18/16   Julianne Rice, MD    Family History Family History  Problem Relation Age of Onset  . Cancer Mother     Breast    Social History Social History  Substance Use Topics  . Smoking status: Never Smoker  . Smokeless tobacco: Never Used  . Alcohol use No     Allergies   Iodinated diagnostic agents; Codeine; and Diphenhydramine   Review of Systems Review of Systems  Constitutional:  Negative for chills and fever.  Eyes: Negative for visual disturbance.  Respiratory: Negative for shortness of breath.   Cardiovascular: Positive for chest pain. Negative for palpitations and leg swelling.  Gastrointestinal: Negative for abdominal pain, diarrhea, nausea and vomiting.  Musculoskeletal: Positive for neck pain.  Skin: Positive for wound.  Neurological: Positive for headaches. Negative for dizziness, syncope, weakness, light-headedness and numbness.  Psychiatric/Behavioral: Negative for confusion.  All other systems reviewed and are  negative.    Physical Exam Updated Vital Signs BP 116/68   Pulse 95   Temp 97.6 F (36.4 C) (Oral)   Resp 20   Ht 5\' 3"  (1.6 m)   Wt 215 lb (97.5 kg)   SpO2 95%   BMI 38.09 kg/m   Physical Exam  Constitutional: She is oriented to person, place, and time. She appears well-developed and well-nourished.  HENT:  Head: Normocephalic and atraumatic.  Mouth/Throat: Oropharynx is clear and moist.  Lower lip swelling. Patient has a 1 cm laceration just inferior to the remaining border on the left. Tenderness to palpation over the left side of the mandible. No malocclusion. Midface is stable.  Eyes: EOM are normal. Pupils are equal, round, and reactive to light.  Neck: Normal range of motion. Neck supple.  Patient with a rolled towel around her neck. She has posterior midline cervical tenderness to palpation.  Cardiovascular: Normal rate and regular rhythm.  Exam reveals no gallop and no friction rub.   No murmur heard. Pulmonary/Chest: Effort normal and breath sounds normal. No respiratory distress. She has no wheezes. She has no rales. She exhibits tenderness (chest wall tenderness over the manubrium. No crepitance or deformity.).  Abdominal: Soft. Bowel sounds are normal. There is no tenderness. There is no rebound and no guarding.  Musculoskeletal: Normal range of motion. She exhibits no edema or tenderness.  Neurological: She is alert and oriented to person, place, and time.  5/5 motor in all extremities. Sensation fully intact.  Skin: Skin is warm and dry. Capillary refill takes less than 2 seconds. No rash noted. No erythema.  Psychiatric: She has a normal mood and affect. Her behavior is normal.  Nursing note and vitals reviewed.    ED Treatments / Results   DIAGNOSTIC STUDIES:  Oxygen Saturation is 98% on RA, normal by my interpretation.    COORDINATION OF CARE:  12:31 PM Discussed treatment plan with pt at bedside and pt agreed to plan.  Labs (all labs ordered are  listed, but only abnormal results are displayed) Labs Reviewed  COMPREHENSIVE METABOLIC PANEL - Abnormal; Notable for the following:       Result Value   Glucose, Bld 107 (*)    All other components within normal limits  CBC WITH DIFFERENTIAL/PLATELET    EKG  EKG Interpretation  Date/Time:  Tuesday July 18 2016 12:11:12 EST Ventricular Rate:  95 PR Interval:    QRS Duration: 109 QT Interval:  322 QTC Calculation: 405 R Axis:   8 Text Interpretation:  Sinus rhythm LVH with secondary repolarization abnormality Baseline wander in lead(s) I III aVR aVL Confirmed by Alvino Chapel  MD, Ovid Curd [phone]) on 07/23/2016 7:01:27 PM       Radiology No results found.  Procedures Procedures (including critical care time)  Medications Ordered in ED Medications  fentaNYL (SUBLIMAZE) injection 50 mcg (50 mcg Intravenous Given 07/18/16 1310)  Tdap (BOOSTRIX) injection 0.5 mL (0.5 mLs Intramuscular Given 07/18/16 1631)     Initial Impression / Assessment and Plan / ED Course  I have reviewed the triage vital signs and the nursing notes.  Pertinent labs & imaging results that were available during my care of the patient were reviewed by me and considered in my medical decision making (see chart for details).    Laceration was repaired in emergency department. CPAP note. Normal neurologic evaluation. CT head and cervical spine without acute findings. Head injury precautions given.   Final Clinical Impressions(s) / ED Diagnoses   Final diagnoses:  Closed head injury, initial encounter  Acute strain of neck muscle, initial encounter  Contusion of chest wall, unspecified laterality, subsequent encounter  Facial laceration, initial encounter    New Prescriptions Discharge Medication List as of 07/18/2016  5:03 PM    START taking these medications   Details  HYDROcodone-acetaminophen (NORCO) 5-325 MG tablet Take 2 tablets by mouth every 6 (six) hours as needed for severe pain., Starting Tue  07/18/2016, Print       I personally performed the services described in this documentation, which was scribed in my presence. The recorded information has been reviewed and is accurate.       Julianne Rice, MD 07/26/16 1754

## 2016-07-18 NOTE — ED Notes (Signed)
C Collar placed.

## 2016-07-18 NOTE — ED Notes (Signed)
Pt taken to ct 

## 2016-07-18 NOTE — ED Notes (Signed)
Pt returned from ct. Nad.  

## 2016-07-18 NOTE — ED Provider Notes (Signed)
  This is a shared visit.    I was asked by the attending physician to repair a laceration to the patient's left lower lip.  This was my only involvement in this patient's care.    She has a 1.5 cm laceration to the left lower lip that does not extend through the [place] border and does not extend through to the oral mucosa. Bleeding controlled.     LACERATION REPAIR Performed by: Ayelen Sciortino L. Authorized by: Hale Bogus Consent: Verbal consent obtained. Risks and benefits: risks, benefits and alternatives were discussed Consent given by: patient Patient identity confirmed: provided demographic data Prepped and Draped in normal sterile fashion Wound explored  Laceration Location: left lower lip  Laceration Length: 1.5 cm  No Foreign Bodies seen or palpated  Anesthesia: local infiltration  Local anesthetic: lidocaine 1 % w/o epinephrine  Anesthetic total: 2 ml  Irrigation method: syringe Amount of cleaning: standard  Skin closure: 5-0 ethilon  Number of sutures: 3  Technique: simple interrupted  Patient tolerance: Patient tolerated the procedure well with no immediate complications.   Wound edges well approximated.  Bleeding controlled, Td updated   Kem Parkinson, PA-C 07/18/16 1646    Julianne Rice, MD 07/18/16 2100

## 2016-07-18 NOTE — ED Notes (Signed)
Pt used bedpan. Pt and linens cleaned. Nad. c collar removed.

## 2016-07-18 NOTE — ED Triage Notes (Signed)
Pt reports was walking into the bathroom and tripped over the bathroom rug and hit face on shower. Pt alert and oriented. Pt denies being on any blood thinners. Pt reports upper lip pain, neck pain, chest pain that is worse with movement, and right knee pain. Pt denies loc. Minimal bleeding noted to upper lip. Airway patent.

## 2016-07-25 ENCOUNTER — Ambulatory Visit (INDEPENDENT_AMBULATORY_CARE_PROVIDER_SITE_OTHER): Payer: Medicare HMO | Admitting: Physician Assistant

## 2016-07-25 ENCOUNTER — Encounter: Payer: Self-pay | Admitting: Physician Assistant

## 2016-07-25 VITALS — BP 119/70 | HR 92 | Temp 97.1°F | Ht 63.0 in | Wt 209.8 lb

## 2016-07-25 DIAGNOSIS — R635 Abnormal weight gain: Secondary | ICD-10-CM | POA: Diagnosis not present

## 2016-07-25 DIAGNOSIS — S01511A Laceration without foreign body of lip, initial encounter: Secondary | ICD-10-CM | POA: Diagnosis not present

## 2016-07-25 DIAGNOSIS — Z4802 Encounter for removal of sutures: Secondary | ICD-10-CM

## 2016-07-25 NOTE — Patient Instructions (Signed)
Breakfast: eggs 2-3 Or greek yogurt low fat DANNON 1 slice delightful [employer] bread  Lunch: 2 slice [employer] delightfully bread       Or Nature's Own Light 4 ounces chicken, turkey, roast beef 1 slice Thin sliced cheese Sargento Mustard ok 1 piece of fruit  Supper: 6 ounces lean meat 2 cups raw/cooked veg or 1 cup pintos, corn lima  3 snacks 100 calories or less  

## 2016-07-26 NOTE — Progress Notes (Signed)
BP 119/70   Pulse 92   Temp 97.1 F (36.2 C) (Oral)   Ht 5\' 3"  (1.6 m)   Wt 209 lb 12.8 oz (95.2 kg)   BMI 37.16 kg/m    Subjective:    Patient ID: Melissa Zavala, female    DOB: 1940/12/10, 76 y.o.   MRN: [id number]  HPI: Melissa Zavala is a 76 y.o. female presenting on 07/25/2016 for Suture / Staple Removal (fell and hit face on tub ) and chest hurting  Had a fall one week ago, Went to the [facility] department with the injury. She did hit her head and had to have 3 sutures in the lower lip. She reports doing well with that and needs to have the sutures out today. She still quite sore across her chest and upper arms where she felt for and caught herself. She has bruising on the right side of the chest but none to the center. She states it is worse when she moves or lifts.  Patient has also gained a little bit of weight gain it would like to have information about particular calorie diet. We'll give this to her and her AVS today  Relevant past medical, surgical, family and social history reviewed and updated as indicated. Allergies and medications reviewed and updated.  Past Medical History:  Diagnosis Date  . Arthritis   . Hypertension     Past Surgical History:  Procedure Laterality Date  . TOTAL KNEE ARTHROPLASTY    . TOTAL KNEE ARTHROPLASTY Bilateral   . TUBAL LIGATION      Review of Systems  Constitutional: Negative.  Negative for activity change, fatigue and fever.  HENT: Negative.   Eyes: Negative.   Respiratory: Negative.  Negative for cough.   Cardiovascular: Negative.  Negative for chest pain.  Gastrointestinal: Negative.  Negative for abdominal pain.  Endocrine: Negative.   Genitourinary: Negative.  Negative for dysuria.  Musculoskeletal: Negative.   Skin: Negative.   Neurological: Negative.     Allergies as of 07/25/2016      Reactions   Iodinated Diagnostic Agents Anaphylaxis   Codeine Other (See Comments)   Hallucinations   Diphenhydramine Other (See Comments)   Patient states "feels like somebody is inside of me clawing me out"      Medication List       Accurate as of 07/25/16 11:59 PM. Always use your most recent med list.          amLODipine 10 MG tablet Commonly known as:  NORVASC Take 1 tablet by mouth daily.   CELEBREX 200 MG capsule Generic drug:  celecoxib Take 1 capsule by mouth daily.   esomeprazole 40 MG capsule Commonly known as:  NEXIUM Take 40 mg by mouth daily at 12 noon.   furosemide 20 MG tablet Commonly known as:  LASIX Take 1 tablet (20 mg total) by mouth daily.   hydrochlorothiazide 25 MG tablet Commonly known as:  HYDRODIURIL Take 25 mg by mouth daily.   HYDROcodone-acetaminophen 5-325 MG tablet Commonly known as:  NORCO Take 2 tablets by mouth every 6 (six) hours as needed for severe pain.   lisinopril 30 MG tablet Commonly known as:  PRINIVIL,ZESTRIL Take 1 tablet by mouth daily.   potassium chloride 10 MEQ tablet Commonly known as:  K-DUR Take 1 tablet (10 mEq total) by mouth daily.   traMADol 50 MG tablet Commonly known as:  ULTRAM Take 2 tablets (100 mg total) by mouth daily.  Objective:    BP 119/70   Pulse 92   Temp 97.1 F (36.2 C) (Oral)   Ht 5\' 3"  (1.6 m)   Wt 209 lb 12.8 oz (95.2 kg)   BMI 37.16 kg/m   Allergies  Allergen Reactions  . Iodinated Diagnostic Agents Anaphylaxis  . Codeine Other (See Comments)    Hallucinations  . Diphenhydramine Other (See Comments)    Patient states "feels like somebody is inside of me clawing me out"    Physical Exam  Constitutional: She is oriented to person, place, and time. She appears well-developed and well-nourished.  HENT:  Head: Normocephalic. Head is with laceration. Head is without abrasion.    Well healed laceration on left lower lip with 3 simple interrupted sutures, mild swelling, no erythema, good closure. Removed the 3 sutures without complication. Continue with wound care  as directed.  Eyes: Conjunctivae and EOM are normal. Pupils are equal, round, and reactive to light.  Cardiovascular: Normal rate, regular rhythm, normal heart sounds and intact distal pulses.   Pulmonary/Chest: Effort normal and breath sounds normal.  Abdominal: Soft. Bowel sounds are normal.  Neurological: She is alert and oriented to person, place, and time. She has normal reflexes.  Skin: Skin is warm and dry. No rash noted.  Psychiatric: She has a normal mood and affect. Her behavior is normal. Judgment and thought content normal.  Nursing note and vitals reviewed.       Assessment & Plan:   1. Visit for suture removal Continue wound care 3 simple interrupted sutures are removed today with good closure, wound care continue.  2. Weight gain 1500 calorie diet   Continue all other maintenance medications as listed above.  Follow up plan: Return if symptoms worsen or fail to improve.  Educational handout given for 1500 calorie diet  Terald Sleeper PA-C [facility] [address]  [place], [place] [address] [phone]   07/26/2016, 9:25 AM

## 2016-08-11 ENCOUNTER — Other Ambulatory Visit: Payer: Self-pay | Admitting: Physician Assistant

## 2016-09-04 ENCOUNTER — Telehealth: Payer: Self-pay | Admitting: Physician Assistant

## 2016-11-07 ENCOUNTER — Other Ambulatory Visit: Payer: Self-pay | Admitting: Physician Assistant

## 2016-11-07 DIAGNOSIS — K219 Gastro-esophageal reflux disease without esophagitis: Secondary | ICD-10-CM

## 2016-12-05 ENCOUNTER — Other Ambulatory Visit: Payer: Self-pay | Admitting: Physician Assistant

## 2016-12-05 DIAGNOSIS — M150 Primary generalized (osteo)arthritis: Principal | ICD-10-CM

## 2016-12-05 DIAGNOSIS — M159 Polyosteoarthritis, unspecified: Secondary | ICD-10-CM

## 2016-12-06 NOTE — Telephone Encounter (Signed)
Tramadol refill called to [facility].

## 2017-01-09 ENCOUNTER — Other Ambulatory Visit: Payer: Self-pay | Admitting: Physician Assistant

## 2017-01-09 DIAGNOSIS — K219 Gastro-esophageal reflux disease without esophagitis: Secondary | ICD-10-CM

## 2017-02-08 ENCOUNTER — Other Ambulatory Visit: Payer: Self-pay | Admitting: Physician Assistant

## 2017-02-08 DIAGNOSIS — M150 Primary generalized (osteo)arthritis: Secondary | ICD-10-CM

## 2017-02-08 DIAGNOSIS — M159 Polyosteoarthritis, unspecified: Secondary | ICD-10-CM

## 2017-02-08 DIAGNOSIS — K219 Gastro-esophageal reflux disease without esophagitis: Secondary | ICD-10-CM

## 2017-02-09 NOTE — Telephone Encounter (Signed)
rx called into pharmacy

## 2017-02-19 ENCOUNTER — Encounter: Payer: Self-pay | Admitting: Physician Assistant

## 2017-02-19 ENCOUNTER — Ambulatory Visit (INDEPENDENT_AMBULATORY_CARE_PROVIDER_SITE_OTHER): Payer: Medicare HMO | Admitting: Physician Assistant

## 2017-02-19 VITALS — BP 118/69 | HR 83 | Temp 97.0°F | Ht 63.0 in | Wt 204.4 lb

## 2017-02-19 DIAGNOSIS — R609 Edema, unspecified: Secondary | ICD-10-CM | POA: Diagnosis not present

## 2017-02-19 DIAGNOSIS — I10 Essential (primary) hypertension: Secondary | ICD-10-CM | POA: Diagnosis not present

## 2017-02-19 DIAGNOSIS — Z23 Encounter for immunization: Secondary | ICD-10-CM

## 2017-02-19 DIAGNOSIS — M150 Primary generalized (osteo)arthritis: Secondary | ICD-10-CM

## 2017-02-19 DIAGNOSIS — M159 Polyosteoarthritis, unspecified: Secondary | ICD-10-CM

## 2017-02-19 DIAGNOSIS — K219 Gastro-esophageal reflux disease without esophagitis: Secondary | ICD-10-CM

## 2017-02-19 MED ORDER — ESOMEPRAZOLE MAGNESIUM 40 MG PO CPDR: DELAYED_RELEASE_CAPSULE | ORAL | 11 refills | Status: DC

## 2017-02-19 MED ORDER — HYDROCHLOROTHIAZIDE 25 MG PO TABS: 25.0000 mg | ORAL_TABLET | Freq: Every day | ORAL | 11 refills | Status: DC

## 2017-02-19 MED ORDER — AMLODIPINE BESYLATE 10 MG PO TABS: 10.0000 mg | ORAL_TABLET | Freq: Every day | ORAL | 11 refills | Status: DC

## 2017-02-19 MED ORDER — TRAMADOL HCL 50 MG PO TABS: 100.0000 mg | ORAL_TABLET | Freq: Every day | ORAL | 5 refills | Status: DC

## 2017-02-19 MED ORDER — POTASSIUM CHLORIDE ER 10 MEQ PO TBCR: 10.0000 meq | EXTENDED_RELEASE_TABLET | Freq: Every day | ORAL | 11 refills | Status: DC

## 2017-02-19 MED ORDER — CELECOXIB 200 MG PO CAPS: ORAL_CAPSULE | ORAL | 5 refills | Status: DC

## 2017-02-19 MED ORDER — LISINOPRIL 30 MG PO TABS: 30.0000 mg | ORAL_TABLET | Freq: Every day | ORAL | 11 refills | Status: DC

## 2017-02-19 MED ORDER — FUROSEMIDE 20 MG PO TABS: 20.0000 mg | ORAL_TABLET | Freq: Every day | ORAL | 1 refills | Status: DC

## 2017-02-19 NOTE — Progress Notes (Signed)
BP 118/69   Pulse 83   Temp (!) 97 F (36.1 C) (Oral)   Ht 5' 3"  (1.6 m)   Wt 204 lb 6.4 oz (92.7 kg)   BMI 36.21 kg/m    Subjective:    Patient ID: Melissa Zavala, female    DOB: Nov 19, 1940, 76 y.o.   MRN: [id number]  HPI: CLARRISA Zavala is a 76 y.o. female presenting on 02/19/2017 for Follow-up (6 month )  This patient comes in for periodic recheck on medications and conditions including GERD, hypertension, osteoarthritis, need for flu vaccine. Overall the patient is doing very well. She has no complaints. Refills will be sent on her medications. She is also due labs today.   All medications are reviewed today. There are no reports of any problems with the medications. All of the medical conditions are reviewed and updated.  Lab work is reviewed and will be ordered as medically necessary. There are no new problems reported with today's visit.   Relevant past medical, surgical, family and social history reviewed and updated as indicated. Allergies and medications reviewed and updated.  Past Medical History:  Diagnosis Date  . Arthritis   . Hypertension     Past Surgical History:  Procedure Laterality Date  . TOTAL KNEE ARTHROPLASTY    . TOTAL KNEE ARTHROPLASTY Bilateral   . TUBAL LIGATION      Review of Systems  Constitutional: Negative.  Negative for activity change, fatigue and fever.  HENT: Negative.   Eyes: Negative.   Respiratory: Negative.  Negative for cough.   Cardiovascular: Negative.  Negative for chest pain.  Gastrointestinal: Negative.  Negative for abdominal pain.  Endocrine: Negative.   Genitourinary: Negative.  Negative for dysuria.  Musculoskeletal: Positive for arthralgias and myalgias.  Skin: Negative.   Neurological: Negative.     Allergies as of 02/19/2017      Reactions   Iodinated Diagnostic Agents Anaphylaxis   Codeine Other (See Comments)   Hallucinations   Diphenhydramine Other (See Comments)   Patient states "feels like somebody is  inside of me clawing me out"      Medication List       Accurate as of 02/19/17 12:28 PM. Always use your most recent med list.          amLODipine 10 MG tablet Commonly known as:  NORVASC Take 1 tablet (10 mg total) by mouth daily.   celecoxib 200 MG capsule Commonly known as:  CELEBREX TAKE (1) CAPSULE DAILY.   esomeprazole 40 MG capsule Commonly known as:  NEXIUM TAKE (1) CAPSULE DAILY.   furosemide 20 MG tablet Commonly known as:  LASIX Take 1 tablet (20 mg total) by mouth daily. WHEN LOTS OF SWELLING   hydrochlorothiazide 25 MG tablet Commonly known as:  HYDRODIURIL Take 1 tablet (25 mg total) by mouth daily.   lisinopril 30 MG tablet Commonly known as:  PRINIVIL,ZESTRIL Take 1 tablet (30 mg total) by mouth daily.   potassium chloride 10 MEQ tablet Commonly known as:  K-DUR Take 1 tablet (10 mEq total) by mouth daily.   traMADol 50 MG tablet Commonly known as:  ULTRAM Take 2 tablets (100 mg total) by mouth daily.          Objective:    BP 118/69   Pulse 83   Temp (!) 97 F (36.1 C) (Oral)   Ht 5' 3"  (1.6 m)   Wt 204 lb 6.4 oz (92.7 kg)   BMI 36.21 kg/m   Allergies  Allergen Reactions  . Iodinated Diagnostic Agents Anaphylaxis  . Codeine Other (See Comments)    Hallucinations  . Diphenhydramine Other (See Comments)    Patient states "feels like somebody is inside of me clawing me out"    Physical Exam  Constitutional: She is oriented to person, place, and time. She appears well-developed and well-nourished.  HENT:  Head: Normocephalic and atraumatic.  Right Ear: Tympanic membrane, external ear and ear canal normal.  Left Ear: Tympanic membrane, external ear and ear canal normal.  Nose: Nose normal. No rhinorrhea.  Mouth/Throat: Oropharynx is clear and moist and mucous membranes are normal. No oropharyngeal exudate or posterior oropharyngeal erythema.  Eyes: Pupils are equal, round, and reactive to light. Conjunctivae and EOM are normal.    Neck: Normal range of motion. Neck supple.  Cardiovascular: Normal rate, regular rhythm, normal heart sounds and intact distal pulses.   Pulmonary/Chest: Effort normal and breath sounds normal.  Abdominal: Soft. Bowel sounds are normal.  Neurological: She is alert and oriented to person, place, and time. She has normal reflexes.  Skin: Skin is warm and dry. No rash noted.  Psychiatric: She has a normal mood and affect. Her behavior is normal. Judgment and thought content normal.  Nursing note and vitals reviewed.       Assessment & Plan:   1. Gastroesophageal reflux disease without esophagitis - esomeprazole (NEXIUM) 40 MG capsule; TAKE (1) CAPSULE DAILY.  Dispense: 30 capsule; Refill: 11 - CMP14+EGFR - CBC with Differential/Platelet - Lipid panel - TSH  2. Primary osteoarthritis involving multiple joints - celecoxib (CELEBREX) 200 MG capsule; TAKE (1) CAPSULE DAILY.  Dispense: 30 capsule; Refill: 5 - traMADol (ULTRAM) 50 MG tablet; Take 2 tablets (100 mg total) by mouth daily.  Dispense: 90 tablet; Refill: 5  3. Peripheral edema - furosemide (LASIX) 20 MG tablet; Take 1 tablet (20 mg total) by mouth daily. WHEN LOTS OF SWELLING  Dispense: 30 tablet; Refill: 1 - CBC with Differential/Platelet - TSH  4. Edema, unspecified type - furosemide (LASIX) 20 MG tablet; Take 1 tablet (20 mg total) by mouth daily. WHEN LOTS OF SWELLING  Dispense: 30 tablet; Refill: 1  5. Essential hypertension - potassium chloride (K-DUR) 10 MEQ tablet; Take 1 tablet (10 mEq total) by mouth daily.  Dispense: 30 tablet; Refill: 11 - lisinopril (PRINIVIL,ZESTRIL) 30 MG tablet; Take 1 tablet (30 mg total) by mouth daily.  Dispense: 30 tablet; Refill: 11 - hydrochlorothiazide (HYDRODIURIL) 25 MG tablet; Take 1 tablet (25 mg total) by mouth daily.  Dispense: 30 tablet; Refill: 11 - amLODipine (NORVASC) 10 MG tablet; Take 1 tablet (10 mg total) by mouth daily.  Dispense: 30 tablet; Refill: 11 - CMP14+EGFR -  CBC with Differential/Platelet - Lipid panel - TSH  6. Encounter for immunization - potassium chloride (K-DUR) 10 MEQ tablet; Take 1 tablet (10 mEq total) by mouth daily.  Dispense: 30 tablet; Refill: 11 - lisinopril (PRINIVIL,ZESTRIL) 30 MG tablet; Take 1 tablet (30 mg total) by mouth daily.  Dispense: 30 tablet; Refill: 11 - hydrochlorothiazide (HYDRODIURIL) 25 MG tablet; Take 1 tablet (25 mg total) by mouth daily.  Dispense: 30 tablet; Refill: 11 - esomeprazole (NEXIUM) 40 MG capsule; TAKE (1) CAPSULE DAILY.  Dispense: 30 capsule; Refill: 11 - celecoxib (CELEBREX) 200 MG capsule; TAKE (1) CAPSULE DAILY.  Dispense: 30 capsule; Refill: 5 - amLODipine (NORVASC) 10 MG tablet; Take 1 tablet (10 mg total) by mouth daily.  Dispense: 30 tablet; Refill: 11 - traMADol (ULTRAM) 50 MG tablet; Take  2 tablets (100 mg total) by mouth daily.  Dispense: 90 tablet; Refill: 5 - furosemide (LASIX) 20 MG tablet; Take 1 tablet (20 mg total) by mouth daily. WHEN LOTS OF SWELLING  Dispense: 30 tablet; Refill: 1 - CMP14+EGFR - CBC with Differential/Platelet - Lipid panel - TSH - Flu vaccine HIGH DOSE PF    Current Outpatient Prescriptions:  .  amLODipine (NORVASC) 10 MG tablet, Take 1 tablet (10 mg total) by mouth daily., Disp: 30 tablet, Rfl: 11 .  celecoxib (CELEBREX) 200 MG capsule, TAKE (1) CAPSULE DAILY., Disp: 30 capsule, Rfl: 5 .  esomeprazole (NEXIUM) 40 MG capsule, TAKE (1) CAPSULE DAILY., Disp: 30 capsule, Rfl: 11 .  hydrochlorothiazide (HYDRODIURIL) 25 MG tablet, Take 1 tablet (25 mg total) by mouth daily., Disp: 30 tablet, Rfl: 11 .  lisinopril (PRINIVIL,ZESTRIL) 30 MG tablet, Take 1 tablet (30 mg total) by mouth daily., Disp: 30 tablet, Rfl: 11 .  potassium chloride (K-DUR) 10 MEQ tablet, Take 1 tablet (10 mEq total) by mouth daily., Disp: 30 tablet, Rfl: 11 .  traMADol (ULTRAM) 50 MG tablet, Take 2 tablets (100 mg total) by mouth daily., Disp: 90 tablet, Rfl: 5 .  furosemide (LASIX) 20 MG  tablet, Take 1 tablet (20 mg total) by mouth daily. WHEN LOTS OF SWELLING, Disp: 30 tablet, Rfl: 1 Continue all other maintenance medications as listed above.  Follow up plan: Return in about 6 months (around 08/20/2017) for recheck.  Educational handout given for [place] PA-C [facility] [address]  [place], [place] [address] [phone]   02/19/2017, 12:28 PM

## 2017-02-19 NOTE — Patient Instructions (Signed)
In a few days you may receive a survey in the mail or online from [employer] regarding your visit with us today. Please take a moment to fill this out. Your feedback is very important to our whole office. It can help us better understand your needs as well as improve your experience and satisfaction. Thank you for taking your time to complete it. We care about you.  Thanh Mottern, PA-C  

## 2017-02-20 LAB — CBC WITH DIFFERENTIAL/PLATELET
BASOS ABS: 0 10*3/uL (ref 0.0–0.2)
BASOS: 1 %
EOS (ABSOLUTE): 0.1 10*3/uL (ref 0.0–0.4)
Eos: 2 %
Hematocrit: 35.3 % (ref 34.0–46.6)
Hemoglobin: 11.5 g/dL (ref 11.1–15.9)
IMMATURE GRANULOCYTES: 0 %
Immature Grans (Abs): 0 10*3/uL (ref 0.0–0.1)
LYMPHS: 29 %
Lymphocytes Absolute: 2.4 10*3/uL (ref 0.7–3.1)
MCH: 26.8 pg (ref 26.6–33.0)
MCHC: 32.6 g/dL (ref 31.5–35.7)
MCV: 82 fL (ref 79–97)
MONOS ABS: 0.6 10*3/uL (ref 0.1–0.9)
Monocytes: 7 %
NEUTROS PCT: 61 %
Neutrophils Absolute: 5 10*3/uL (ref 1.4–7.0)
PLATELETS: 280 10*3/uL (ref 150–379)
RBC: 4.29 x10E6/uL (ref 3.77–5.28)
RDW: 14.2 % (ref 12.3–15.4)
WBC: 8 10*3/uL (ref 3.4–10.8)

## 2017-02-20 LAB — CMP14+EGFR
A/G RATIO: 1.7 (ref 1.2–2.2)
ALK PHOS: 64 IU/L (ref 39–117)
ALT: 8 IU/L (ref 0–32)
AST: 13 IU/L (ref 0–40)
Albumin: 4 g/dL (ref 3.5–4.8)
BILIRUBIN TOTAL: 0.4 mg/dL (ref 0.0–1.2)
BUN/Creatinine Ratio: 17 (ref 12–28)
BUN: 13 mg/dL (ref 8–27)
CHLORIDE: 103 mmol/L (ref 96–106)
CO2: 24 mmol/L (ref 20–29)
Calcium: 9.9 mg/dL (ref 8.7–10.3)
Creatinine, Ser: 0.76 mg/dL (ref 0.57–1.00)
GFR calc Af Amer: 88 mL/min/{1.73_m2} (ref >59)
GFR calc non Af Amer: 76 mL/min/{1.73_m2} (ref >59)
GLOBULIN, TOTAL: 2.4 g/dL (ref 1.5–4.5)
Glucose: 98 mg/dL (ref 65–99)
Potassium: 3.9 mmol/L (ref 3.5–5.2)
SODIUM: 139 mmol/L (ref 134–144)
Total Protein: 6.4 g/dL (ref 6.0–8.5)

## 2017-02-20 LAB — LIPID PANEL
CHOLESTEROL TOTAL: 193 mg/dL (ref 100–199)
Chol/HDL Ratio: 2.3 ratio (ref 0.0–4.4)
HDL: 83 mg/dL (ref >39)
LDL Calculated: 97 mg/dL (ref 0–99)
Triglycerides: 64 mg/dL (ref 0–149)
VLDL Cholesterol Cal: 13 mg/dL (ref 5–40)

## 2017-02-20 LAB — TSH: TSH: 0.416 u[IU]/mL — AB (ref 0.450–4.500)

## 2017-02-22 ENCOUNTER — Other Ambulatory Visit: Payer: Self-pay

## 2017-02-22 DIAGNOSIS — R7989 Other specified abnormal findings of blood chemistry: Secondary | ICD-10-CM

## 2017-03-12 ENCOUNTER — Other Ambulatory Visit: Payer: Self-pay | Admitting: Physician Assistant

## 2017-03-12 DIAGNOSIS — M150 Primary generalized (osteo)arthritis: Principal | ICD-10-CM

## 2017-03-12 DIAGNOSIS — M159 Polyosteoarthritis, unspecified: Secondary | ICD-10-CM

## 2017-03-30 ENCOUNTER — Ambulatory Visit: Payer: Medicare HMO | Admitting: Nurse Practitioner

## 2017-03-30 ENCOUNTER — Ambulatory Visit (INDEPENDENT_AMBULATORY_CARE_PROVIDER_SITE_OTHER): Payer: Medicare HMO

## 2017-03-30 ENCOUNTER — Encounter: Payer: Self-pay | Admitting: Nurse Practitioner

## 2017-03-30 VITALS — BP 143/77 | HR 84 | Temp 96.7°F | Ht 63.0 in | Wt 210.0 lb

## 2017-03-30 DIAGNOSIS — M79604 Pain in right leg: Secondary | ICD-10-CM

## 2017-03-30 DIAGNOSIS — M1611 Unilateral primary osteoarthritis, right hip: Secondary | ICD-10-CM | POA: Diagnosis not present

## 2017-03-30 MED ORDER — METHYLPREDNISOLONE ACETATE 80 MG/ML IJ SUSP
80.0000 mg | Freq: Once | INTRAMUSCULAR | Status: AC
  Administered 2017-03-30: 80 mg via INTRAMUSCULAR

## 2017-03-30 NOTE — Progress Notes (Signed)
   Subjective:    Patient ID: Melissa Zavala, female    DOB: Jan 21, 1941, 76 y.o.   MRN: [id number]  HPI Patient comes in today c/o right leg pain. Pain is in thigh today but in evenings it is in lower leg in front. describes pain in thigh as achy pain. Rates pain a 10/10 today. Patient hit her right thigh on bed about 4 weeks ago and had a bruise but resolved. She denies any knee pain. Started about 4 weeks ago. Tylenol helps with pain. Walking increases pain and sitting helps decrease pain.  Review of Systems  Constitutional: Negative.   Respiratory: Negative.   Cardiovascular: Negative.   Musculoskeletal: Positive for myalgias.  Neurological: Negative.   Psychiatric/Behavioral: Negative.   All other systems reviewed and are negative.      Objective:   Physical Exam  Constitutional: She is oriented to person, place, and time. She appears well-developed and well-nourished. No distress.  Cardiovascular: Normal rate, regular rhythm and intact distal pulses.  Pulmonary/Chest: Effort normal and breath sounds normal.  Musculoskeletal:  Pain in right anterior thigh and right ant lower leg- no contusions.  Neurological: She is alert and oriented to person, place, and time.  Skin: Skin is warm.  Psychiatric: She has a normal mood and affect. Her behavior is normal. Judgment and thought content normal.   BP (!) 143/77   Pulse 84   Temp (!) 96.7 F (35.9 C) (Oral)   Ht 5\' 3"  (1.6 m)   Wt 210 lb (95.3 kg)   BMI 37.20 kg/m   Left tib/fib- knee replacement appears normal- tib/fib normal-Preliminary reading by Ronnald Collum, FNP  [facility] Left femur- mild osteoarthritis of right hip joint-Preliminary reading by Ronnald Collum, FNP  [facility]       Assessment & Plan:   1. Right leg pain   2. Primary osteoarthritis of right hip    Meds ordered this encounter  Medications  . methylPREDNISolone acetate (DEPO-MEDROL) injection 80 mg   Rest elevate leg when sitting RTO prn  Melissa  Hassell Done, FNP

## 2017-03-30 NOTE — Patient Instructions (Signed)

## 2017-05-16 DIAGNOSIS — R918 Other nonspecific abnormal finding of lung field: Secondary | ICD-10-CM | POA: Diagnosis not present

## 2017-05-16 DIAGNOSIS — Z888 Allergy status to other drugs, medicaments and biological substances status: Secondary | ICD-10-CM | POA: Diagnosis not present

## 2017-05-16 DIAGNOSIS — R05 Cough: Secondary | ICD-10-CM | POA: Diagnosis not present

## 2017-05-16 DIAGNOSIS — R609 Edema, unspecified: Secondary | ICD-10-CM | POA: Diagnosis not present

## 2017-05-16 DIAGNOSIS — I10 Essential (primary) hypertension: Secondary | ICD-10-CM | POA: Diagnosis not present

## 2017-05-16 DIAGNOSIS — R600 Localized edema: Secondary | ICD-10-CM | POA: Diagnosis not present

## 2017-05-16 DIAGNOSIS — J189 Pneumonia, unspecified organism: Secondary | ICD-10-CM | POA: Diagnosis not present

## 2017-05-16 DIAGNOSIS — S29011A Strain of muscle and tendon of front wall of thorax, initial encounter: Secondary | ICD-10-CM | POA: Diagnosis not present

## 2017-05-16 DIAGNOSIS — R079 Chest pain, unspecified: Secondary | ICD-10-CM | POA: Diagnosis not present

## 2017-05-16 DIAGNOSIS — Z79899 Other long term (current) drug therapy: Secondary | ICD-10-CM | POA: Diagnosis not present

## 2017-05-16 DIAGNOSIS — J181 Lobar pneumonia, unspecified organism: Secondary | ICD-10-CM | POA: Diagnosis not present

## 2017-05-16 DIAGNOSIS — Z885 Allergy status to narcotic agent status: Secondary | ICD-10-CM | POA: Diagnosis not present

## 2017-05-17 DIAGNOSIS — R600 Localized edema: Secondary | ICD-10-CM | POA: Diagnosis not present

## 2017-05-17 DIAGNOSIS — J189 Pneumonia, unspecified organism: Secondary | ICD-10-CM | POA: Diagnosis not present

## 2017-05-17 DIAGNOSIS — I10 Essential (primary) hypertension: Secondary | ICD-10-CM | POA: Diagnosis not present

## 2017-05-17 DIAGNOSIS — R079 Chest pain, unspecified: Secondary | ICD-10-CM | POA: Diagnosis not present

## 2017-05-18 DIAGNOSIS — J181 Lobar pneumonia, unspecified organism: Secondary | ICD-10-CM | POA: Diagnosis not present

## 2017-05-18 DIAGNOSIS — R600 Localized edema: Secondary | ICD-10-CM | POA: Diagnosis not present

## 2017-05-18 DIAGNOSIS — I10 Essential (primary) hypertension: Secondary | ICD-10-CM | POA: Diagnosis not present

## 2017-05-18 DIAGNOSIS — R079 Chest pain, unspecified: Secondary | ICD-10-CM | POA: Diagnosis not present

## 2017-05-28 ENCOUNTER — Ambulatory Visit: Payer: Medicare HMO | Admitting: Physician Assistant

## 2017-05-29 ENCOUNTER — Encounter: Payer: Self-pay | Admitting: Physician Assistant

## 2017-05-30 ENCOUNTER — Encounter: Payer: Self-pay | Admitting: Physician Assistant

## 2017-05-30 ENCOUNTER — Ambulatory Visit (INDEPENDENT_AMBULATORY_CARE_PROVIDER_SITE_OTHER): Payer: Medicare HMO | Admitting: Physician Assistant

## 2017-05-30 VITALS — BP 129/60 | HR 45 | Temp 97.1°F | Ht 63.0 in | Wt 211.0 lb

## 2017-05-30 DIAGNOSIS — K1379 Other lesions of oral mucosa: Secondary | ICD-10-CM

## 2017-05-30 DIAGNOSIS — M25561 Pain in right knee: Secondary | ICD-10-CM | POA: Diagnosis not present

## 2017-05-30 DIAGNOSIS — M150 Primary generalized (osteo)arthritis: Secondary | ICD-10-CM | POA: Diagnosis not present

## 2017-05-30 DIAGNOSIS — M159 Polyosteoarthritis, unspecified: Secondary | ICD-10-CM

## 2017-05-30 MED ORDER — MUPIROCIN 2 % EX OINT: 1.0000 "application " | TOPICAL_OINTMENT | Freq: Three times a day (TID) | CUTANEOUS | 0 refills | Status: DC

## 2017-05-30 MED ORDER — TRAMADOL HCL 50 MG PO TABS: 100.0000 mg | ORAL_TABLET | Freq: Every day | ORAL | 5 refills | Status: DC

## 2017-05-30 NOTE — Patient Instructions (Signed)
In a few days you may receive a survey in the mail or online from [employer] regarding your visit with us today. Please take a moment to fill this out. Your feedback is very important to our whole office. It can help us better understand your needs as well as improve your experience and satisfaction. Thank you for taking your time to complete it. We care about you.  Ameerah Huffstetler, PA-C  

## 2017-05-30 NOTE — Progress Notes (Signed)
BP 129/60   Pulse (!) 45   Temp (!) 97.1 F (36.2 C) (Oral)   Ht 5\' 3"  (1.6 m)   Wt 211 lb (95.7 kg)   BMI 37.38 kg/m    Subjective:    Patient ID: Melissa Zavala, female    DOB: [birth year], 77 y.o.   MRN: [id number]  HPI: Melissa Zavala is a 77 y.o. female presenting on 05/30/2017 for Hospitalization Follow-up (pneumonia )  The patient had pneumonia while she was visiting her son over the holidays.  After she was discharged she did stay another week at his home and he helped take care of her.  She did spend 2 nights in the hospital.  She is feeling much better from this.  The only thing residual she has an occasional cough.  And it is mostly affected when she gets too hot or has some drainage in her throat.  She has significant osteoarthritis throughout her body.  Her right knee is bothering her a lot these days.  It is been many years ago that she had replacement done.  There is a new and different change to this.  I am going to get her back with an orthopedist in [place].  Relevant past medical, surgical, family and social history reviewed and updated as indicated. Allergies and medications reviewed and updated.  Past Medical History:  Diagnosis Date  . Arthritis   . Hypertension     Past Surgical History:  Procedure Laterality Date  . TOTAL KNEE ARTHROPLASTY    . TOTAL KNEE ARTHROPLASTY Bilateral   . TUBAL LIGATION      Review of Systems  Constitutional: Positive for fatigue. Negative for activity change and fever.  HENT: Negative.   Eyes: Negative.   Respiratory: Negative.  Negative for cough.   Cardiovascular: Negative.  Negative for chest pain.  Gastrointestinal: Negative.  Negative for abdominal pain.  Endocrine: Negative.   Genitourinary: Negative.  Negative for dysuria.  Musculoskeletal: Positive for arthralgias, gait problem and joint swelling.  Skin: Negative.     Allergies as of 05/30/2017      Reactions   Iodinated Diagnostic Agents Anaphylaxis     Codeine Other (See Comments)   Hallucinations   Diphenhydramine Other (See Comments)   Patient states "feels like somebody is inside of me clawing me out"      Medication List        Accurate as of 05/30/17  2:12 PM. Always use your most recent med list.          amLODipine 10 MG tablet Commonly known as:  NORVASC Take 1 tablet (10 mg total) by mouth daily.   celecoxib 200 MG capsule Commonly known as:  CELEBREX TAKE (1) CAPSULE DAILY.   esomeprazole 40 MG capsule Commonly known as:  NEXIUM TAKE (1) CAPSULE DAILY.   furosemide 20 MG tablet Commonly known as:  LASIX Take 1 tablet (20 mg total) by mouth daily. WHEN LOTS OF SWELLING   hydrochlorothiazide 25 MG tablet Commonly known as:  HYDRODIURIL Take 1 tablet (25 mg total) by mouth daily.   lisinopril 30 MG tablet Commonly known as:  PRINIVIL,ZESTRIL Take 1 tablet (30 mg total) by mouth daily.   mupirocin ointment 2 % Commonly known as:  BACTROBAN Place 1 application into the nose 3 (three) times daily.   potassium chloride 10 MEQ tablet Commonly known as:  K-DUR Take 1 tablet (10 mEq total) by mouth daily.   traMADol 50 MG tablet Commonly known as:  ULTRAM Take 2 tablets (100 mg total) by mouth daily.          Objective:    BP 129/60   Pulse (!) 45   Temp (!) 97.1 F (36.2 C) (Oral)   Ht 5\' 3"  (1.6 m)   Wt 211 lb (95.7 kg)   BMI 37.38 kg/m   Allergies  Allergen Reactions  . Iodinated Diagnostic Agents Anaphylaxis  . Codeine Other (See Comments)    Hallucinations  . Diphenhydramine Other (See Comments)    Patient states "feels like somebody is inside of me clawing me out"    Physical Exam  Constitutional: She is oriented to person, place, and time. She appears well-developed and well-nourished.  HENT:  Head: Normocephalic and atraumatic.  Right Ear: Tympanic membrane, external ear and ear canal normal.  Left Ear: Tympanic membrane, external ear and ear canal normal.  Nose: Nose normal.  No rhinorrhea.  Mouth/Throat: Oropharynx is clear and moist and mucous membranes are normal. No oropharyngeal exudate or posterior oropharyngeal erythema.  Eyes: Conjunctivae and EOM are normal. Pupils are equal, round, and reactive to light.  Neck: Normal range of motion. Neck supple.  Cardiovascular: Normal rate, regular rhythm, normal heart sounds and intact distal pulses.  Pulmonary/Chest: Effort normal and breath sounds normal.  Abdominal: Soft. Bowel sounds are normal.  Musculoskeletal:       Right knee: She exhibits decreased range of motion and swelling.  Neurological: She is alert and oriented to person, place, and time. She has normal reflexes.  Skin: Skin is warm and dry. No rash noted.  Psychiatric: She has a normal mood and affect. Her behavior is normal. Judgment and thought content normal.        Assessment & Plan:   1. Primary osteoarthritis involving multiple joints - traMADol (ULTRAM) 50 MG tablet; Take 2 tablets (100 mg total) by mouth daily.  Dispense: 90 tablet; Refill: 5  2. Generalized OA - traMADol (ULTRAM) 50 MG tablet; Take 2 tablets (100 mg total) by mouth daily.  Dispense: 90 tablet; Refill: 5  3. Acute pain of right knee - Ambulatory referral to Orthopedic Surgery  4. Mouth sore - mupirocin ointment (BACTROBAN) 2 %; Place 1 application into the nose 3 (three) times daily.  Dispense: 30 g; Refill: 0    Current Outpatient Medications:  .  amLODipine (NORVASC) 10 MG tablet, Take 1 tablet (10 mg total) by mouth daily., Disp: 30 tablet, Rfl: 11 .  celecoxib (CELEBREX) 200 MG capsule, TAKE (1) CAPSULE DAILY., Disp: 30 capsule, Rfl: 5 .  esomeprazole (NEXIUM) 40 MG capsule, TAKE (1) CAPSULE DAILY., Disp: 30 capsule, Rfl: 11 .  furosemide (LASIX) 20 MG tablet, Take 1 tablet (20 mg total) by mouth daily. WHEN LOTS OF SWELLING, Disp: 30 tablet, Rfl: 1 .  hydrochlorothiazide (HYDRODIURIL) 25 MG tablet, Take 1 tablet (25 mg total) by mouth daily., Disp: 30  tablet, Rfl: 11 .  lisinopril (PRINIVIL,ZESTRIL) 30 MG tablet, Take 1 tablet (30 mg total) by mouth daily., Disp: 30 tablet, Rfl: 11 .  mupirocin ointment (BACTROBAN) 2 %, Place 1 application into the nose 3 (three) times daily., Disp: 30 g, Rfl: 0 .  potassium chloride (K-DUR) 10 MEQ tablet, Take 1 tablet (10 mEq total) by mouth daily., Disp: 30 tablet, Rfl: 11 .  traMADol (ULTRAM) 50 MG tablet, Take 2 tablets (100 mg total) by mouth daily., Disp: 90 tablet, Rfl: 5 Continue all other maintenance medications as listed above.  Follow up plan: No Follow-up on  file.  Educational handout given for [place] PA-C [place] [address]  [place], [place] [address] [phone]   05/30/2017, 2:12 PM

## 2017-06-01 ENCOUNTER — Telehealth: Payer: Self-pay

## 2017-06-01 DIAGNOSIS — M159 Polyosteoarthritis, unspecified: Secondary | ICD-10-CM

## 2017-06-01 DIAGNOSIS — M150 Primary generalized (osteo)arthritis: Principal | ICD-10-CM

## 2017-06-01 MED ORDER — TRAMADOL HCL 50 MG PO TABS: 100.0000 mg | ORAL_TABLET | Freq: Every day | ORAL | 5 refills | Status: DC

## 2017-06-01 NOTE — Telephone Encounter (Signed)
Patient said Tramadol suppose to be sent in  Pharmacy said it wasn't

## 2017-06-06 ENCOUNTER — Telehealth: Payer: Self-pay | Admitting: Physician Assistant

## 2017-06-06 NOTE — Telephone Encounter (Signed)
Does she know she has an ortho appointment on 06/12/17?

## 2017-06-06 NOTE — Telephone Encounter (Signed)
Please advise and route to [facility]

## 2017-06-06 NOTE — Telephone Encounter (Signed)
Patient did not know about the referral. I have sent message to referral to call patient with information.

## 2017-06-07 NOTE — Telephone Encounter (Signed)
noted 

## 2017-06-21 DIAGNOSIS — M85862 Other specified disorders of bone density and structure, left lower leg: Secondary | ICD-10-CM | POA: Diagnosis not present

## 2017-06-21 DIAGNOSIS — G8929 Other chronic pain: Secondary | ICD-10-CM | POA: Diagnosis not present

## 2017-06-21 DIAGNOSIS — Z885 Allergy status to narcotic agent status: Secondary | ICD-10-CM | POA: Diagnosis not present

## 2017-06-21 DIAGNOSIS — M25561 Pain in right knee: Secondary | ICD-10-CM | POA: Diagnosis not present

## 2017-06-21 DIAGNOSIS — Z96653 Presence of artificial knee joint, bilateral: Secondary | ICD-10-CM | POA: Diagnosis not present

## 2017-06-21 DIAGNOSIS — Z888 Allergy status to other drugs, medicaments and biological substances status: Secondary | ICD-10-CM | POA: Diagnosis not present

## 2017-06-21 DIAGNOSIS — M1711 Unilateral primary osteoarthritis, right knee: Secondary | ICD-10-CM | POA: Diagnosis not present

## 2017-06-21 DIAGNOSIS — M85861 Other specified disorders of bone density and structure, right lower leg: Secondary | ICD-10-CM | POA: Diagnosis not present

## 2017-06-26 ENCOUNTER — Other Ambulatory Visit: Payer: Self-pay

## 2017-06-26 ENCOUNTER — Ambulatory Visit: Payer: Medicare HMO | Attending: Physician Assistant | Admitting: Physical Therapy

## 2017-06-26 DIAGNOSIS — G8929 Other chronic pain: Secondary | ICD-10-CM

## 2017-06-26 DIAGNOSIS — M25661 Stiffness of right knee, not elsewhere classified: Secondary | ICD-10-CM | POA: Insufficient documentation

## 2017-06-26 DIAGNOSIS — M25562 Pain in left knee: Secondary | ICD-10-CM | POA: Diagnosis not present

## 2017-06-26 DIAGNOSIS — M25561 Pain in right knee: Secondary | ICD-10-CM | POA: Insufficient documentation

## 2017-06-26 DIAGNOSIS — M25662 Stiffness of left knee, not elsewhere classified: Secondary | ICD-10-CM | POA: Insufficient documentation

## 2017-06-26 NOTE — Patient Instructions (Signed)
   Melissa Zavala, PT, DPT [facility] [address] [place], , [address] Phone: [phone]   Fax:  [phone]  

## 2017-06-26 NOTE — Therapy (Signed)
[facility], [place], [address] Phone: [phone]   Fax:  [phone]  Physical Therapy Evaluation  Patient Details  Name: Melissa Zavala MRN: [id number] Date of Birth: [birth year] Referring Provider: Hervey Ard, PA-C   Encounter Date: 06/26/2017  PT End of Session - 06/26/17 0917    Visit Number  1    Number of Visits  18    Date for PT Re-Evaluation  08/07/17    PT Start Time  0903    PT Stop Time  0946    PT Time Calculation (min)  43 min    Activity Tolerance  Patient tolerated treatment well    Behavior During Therapy  [facility] for tasks assessed/performed       Past Medical History:  Diagnosis Date  . Arthritis   . Hypertension     Past Surgical History:  Procedure Laterality Date  . TOTAL KNEE ARTHROPLASTY    . TOTAL KNEE ARTHROPLASTY Bilateral   . TUBAL LIGATION      There were no vitals filed for this visit.   Subjective Assessment - 06/26/17 1248    Subjective  Patient presents to physical therapy with complaints of pain in both knees. She stated R knee pain began in October; she received a steroid shot which helped for some time but pain started to become more severe in December 2018. She states her right knee "locks up" when coming to standing especially after sitting for long periods of time. She needs to move the knee once she comes to standing to "unlock it". Patient noted pain is severe up to 10/10 when it "locks up" but can get to 0 with just some "soreness" at rest. Patient's goals are to decrease pain and move better.    Limitations  Sitting;Walking    How long can you sit comfortably?  Less than 1 hr    Patient Stated Goals  "get better"    Currently in Pain?  Yes         [facility] PT Assessment - 06/26/17 0001      Assessment   Medical Diagnosis  Chronic pain in R knee, R knee osteoarthritis,s/p bilateral knee replacement    Referring Provider  Hervey Ard, PA-C    Onset  Date/Surgical Date  -- December 2018    Next MD Visit  N/A    Prior Therapy  yes      Precautions   Precautions  None      Restrictions   Weight Bearing Restrictions  No      Balance Screen   Has the patient fallen in the past 6 months  No    Has the patient had a decrease in activity level because of a fear of falling?   No    Is the patient reluctant to leave their home because of a fear of falling?   No      Home Environment   Living Environment  Private residence    Living Arrangements  Alone    Type of [place] entrance      Prior Function   Level of [place]  Retired      Observation/Other Assessments-Edema    Edema  -- noted pitting edema 1+ at ankles bilaterally       Sensation   Light Touch  Appears Intact      ROM / Strength   AROM /  PROM / Strength  AROM;PROM;Strength      AROM   Overall AROM   Deficits    AROM Assessment Site  Knee    Right/Left Knee  Right;Left    Right Knee Extension  -3    Right Knee Flexion  93    Left Knee Extension  0    Left Knee Flexion  96      Strength   Overall Strength  Within functional limits for tasks performed    Overall Strength Comments  Bilateral hip flexion 4-/5    Strength Assessment Site  Knee    Right/Left Knee  Right;Left    Right Knee Flexion  4+/5    Right Knee Extension  4+/5    Left Knee Flexion  4+/5    Left Knee Extension  4+/5      Flexibility   Soft Tissue Assessment /Muscle Length  yes    Hamstrings  52 degrees right, 49 degrees left      Transfers   Transfers  Independent with all Transfers      Ambulation/Gait   Ambulation/Gait  Yes    Gait Pattern  Within Functional Limits;Step-through pattern;Wide base of support;Decreased hip/knee flexion - left;Decreased hip/knee flexion - right    Ambulation Surface  Level      Balance   Balance Assessed  No (-) [place], increased sway with tandem but not LOB              Objective measurements completed on examination: See above findings.              PT Education - 06/26/17 1253    Education provided  Yes    Education Details  LAQ, Heel slides, pain free ROM, seated marches    Person(s) Educated  Patient    Methods  Explanation;Demonstration;Handout    Comprehension  Verbalized understanding;Returned demonstration       PT Short Term Goals - 06/26/17 1442      PT SHORT TERM GOAL #1   Title  STG=LTG        PT Long Term Goals - 06/26/17 1442      PT LONG TERM GOAL #1   Title  Patient will be independent with HEP    Time  6    Period  Weeks    Target Date  08/07/17      PT LONG TERM GOAL #2   Title  Patient will improve Bilateral knee flexion strength to 5/5 to perform functional activites    Baseline  4+/5    Time  6    Period  Weeks    Status  New    Target Date  08/07/17      PT LONG TERM GOAL #3   Title  Patient will report less than or equal to 2/10 pain in right knee when transitioning from sit to stand.    Time  6    Period  Weeks    Status  New    Target Date  08/07/17      PT LONG TERM GOAL #4   Title  Patient will improve bilateral knee flexion AROM to 115 degrees to allow adeuate movement for ADLs and functional activities.    Baseline  93    Time  6    Period  Weeks    Status  New    Target Date  08/07/17             Plan - 06/26/17 1812  Clinical Impression Statement  Patient is a 77 year old female with complaints of bilateral knee pain R>L. Patient has a history of bilateral knee replacements >10 years ago. Patient limited with AROM and MMT, bilaterally. Patient noted with increased sway during tandem stance but not LOB. Patient would benefit from skilled physical therapy to decrease pain, improve ROM and improve strength to perform functional activities and return to PLOF.    Clinical Presentation  Stable    Clinical Decision Making  Low    Rehab Potential  Good    PT  Frequency  3x / week    PT Duration  6 weeks    PT Treatment/Interventions  ADLs/Self Care Home Management;Electrical Stimulation;Iontophoresis 4mg /ml Dexamethasone;Ultrasound;Therapeutic exercise;Therapeutic activities;Functional mobility training;Gait training;Stair training;Balance training;Neuromuscular re-education;Patient/family education;Manual techniques;Passive range of motion;Vasopneumatic Device    PT Next Visit Plan  Begin on Nustep as warm up, knee strengthening, Modalities PRN.    PT Home Exercise Plan  LAQ, heel slides, seated marching    Consulted and Agree with Plan of Care  Patient       Patient will benefit from skilled therapeutic intervention in order to improve the following deficits and impairments:  Abnormal gait, Pain, Decreased range of motion, Decreased strength  Visit Diagnosis: Chronic pain of right knee - Plan: PT plan of care cert/re-cert  Chronic pain of left knee - Plan: PT plan of care cert/re-cert  Stiffness of right knee, not elsewhere classified - Plan: PT plan of care cert/re-cert  Stiffness of left knee, not elsewhere classified - Plan: PT plan of care cert/re-cert     Problem List Patient Active Problem List   Diagnosis Date Noted  . Acute pain of right knee 05/30/2017  . Essential hypertension 02/19/2017  . Gastroesophageal reflux disease without esophagitis 06/11/2016  . Personal history of ovarian cyst 06/11/2016  . Pain in joint involving left pelvic region and thigh 06/11/2016  . Age-related osteoporosis without current pathological fracture 06/11/2016  . Screening for breast cancer 06/11/2016  . Bilateral carpal tunnel syndrome 06/09/2016  . Generalized OA 06/09/2016  . Peripheral edema 01/03/2016  . Morbid obesity with BMI of 40.0-44.9, adult ([place]) 01/03/2016  . Right shoulder pain 07/06/2015   Gabriela Eves, PT, DPT 06/26/2017, 6:16 PM  [facility] [address] [place],  [place], [address] Phone: [phone]   Fax:  [phone]  Name: PHYLIS JAVED MRN: [id number] Date of Birth: 1941/02/23

## 2017-06-28 ENCOUNTER — Encounter: Payer: Self-pay | Admitting: Physical Therapy

## 2017-06-28 ENCOUNTER — Ambulatory Visit: Payer: Medicare HMO | Admitting: Physical Therapy

## 2017-06-28 DIAGNOSIS — M25561 Pain in right knee: Principal | ICD-10-CM

## 2017-06-28 DIAGNOSIS — G8929 Other chronic pain: Secondary | ICD-10-CM

## 2017-06-28 DIAGNOSIS — M25661 Stiffness of right knee, not elsewhere classified: Secondary | ICD-10-CM

## 2017-06-28 DIAGNOSIS — M25562 Pain in left knee: Secondary | ICD-10-CM | POA: Diagnosis not present

## 2017-06-28 DIAGNOSIS — M25662 Stiffness of left knee, not elsewhere classified: Secondary | ICD-10-CM | POA: Diagnosis not present

## 2017-06-28 NOTE — Therapy (Signed)
[facility], [place], [address] Phone: [phone]   Fax:  [phone]  Physical Therapy Treatment  Patient Details  Name: Melissa Zavala MRN: [id number] Date of Birth: Dec 14, 1940 Referring Provider: Hervey Ard, PA-C   Encounter Date: 06/28/2017  PT End of Session - 06/28/17 1251    Visit Number  2    Number of Visits  18    Date for PT Re-Evaluation  08/07/17    PT Start Time  1030    PT Stop Time  1122    PT Time Calculation (min)  52 min    Activity Tolerance  Patient tolerated treatment well    Behavior During Therapy  [facility] for tasks assessed/performed       Past Medical History:  Diagnosis Date  . Arthritis   . Hypertension     Past Surgical History:  Procedure Laterality Date  . TOTAL KNEE ARTHROPLASTY    . TOTAL KNEE ARTHROPLASTY Bilateral   . TUBAL LIGATION      There were no vitals filed for this visit.  Subjective Assessment - 06/28/17 1251    Subjective  No new complaints.                      [facility] Adult PT Treatment/Exercise - 06/28/17 0001      Exercises   Exercises  Knee/Hip      Knee/Hip Exercises: Aerobic   Nustep  Level 2 x 15 minutes.      Modalities   Modalities  Electrical Stimulation;Moist Heat      Moist Heat Therapy   Number Minutes Moist Heat  20 Minutes    Moist Heat Location  -- Right knee.      Acupuncturist Location  Right knee.    Electrical Stimulation Action  IFC    Electrical Stimulation Parameters  80-150 Hz x 20 minutes.    Electrical Stimulation Goals  Pain      Manual Therapy   Manual Therapy  Passive ROM    Manual therapy comments  In supine:  Gentle PROM to patient's right knee x 8 minutes.               PT Short Term Goals - 06/26/17 1442      PT SHORT TERM GOAL #1   Title  STG=LTG        PT Long Term Goals - 06/26/17 1442      PT LONG TERM GOAL #1   Title  Patient will be  independent with HEP    Time  6    Period  Weeks    Target Date  08/07/17      PT LONG TERM GOAL #2   Title  Patient will improve Bilateral knee flexion strength to 5/5 to perform functional activites    Baseline  4+/5    Time  6    Period  Weeks    Status  New    Target Date  08/07/17      PT LONG TERM GOAL #3   Title  Patient will report less than or equal to 2/10 pain in right knee when transitioning from sit to stand.    Time  6    Period  Weeks    Status  New    Target Date  08/07/17      PT LONG TERM GOAL #4   Title  Patient will improve bilateral knee flexion AROM  to 115 degrees to allow adeuate movement for ADLs and functional activities.    Baseline  93    Time  6    Period  Weeks    Status  New    Target Date  08/07/17            Plan - 06/28/17 1253    Clinical Impression Statement  The patient die well with treatment today though she has right knee pain at endrange flexion.    PT Treatment/Interventions  ADLs/Self Care Home Management;Electrical Stimulation;Iontophoresis 4mg /ml Dexamethasone;Ultrasound;Therapeutic exercise;Therapeutic activities;Functional mobility training;Gait training;Stair training;Balance training;Neuromuscular re-education;Patient/family education;Manual techniques;Passive range of motion;Vasopneumatic Device    PT Next Visit Plan  Begin on Nustep as warm up, knee strengthening, Modalities PRN.    PT Home Exercise Plan  LAQ, heel slides, seated marching       Patient will benefit from skilled therapeutic intervention in order to improve the following deficits and impairments:     Visit Diagnosis: Chronic pain of right knee  Chronic pain of left knee  Stiffness of right knee, not elsewhere classified  Stiffness of left knee, not elsewhere classified     Problem List Patient Active Problem List   Diagnosis Date Noted  . Acute pain of right knee 05/30/2017  . Essential hypertension 02/19/2017  . Gastroesophageal reflux  disease without esophagitis 06/11/2016  . Personal history of ovarian cyst 06/11/2016  . Pain in joint involving left pelvic region and thigh 06/11/2016  . Age-related osteoporosis without current pathological fracture 06/11/2016  . Screening for breast cancer 06/11/2016  . Bilateral carpal tunnel syndrome 06/09/2016  . Generalized OA 06/09/2016  . Peripheral edema 01/03/2016  . Morbid obesity with BMI of 40.0-44.9, adult ([place]) 01/03/2016  . Right shoulder pain 07/06/2015    Amesha Bailey, [place] MPT 06/28/2017, 12:57 PM  [facility] [address] [place], [place], [address] Phone: [phone]   Fax:  [phone]  Name: Melissa Zavala MRN: [id number] Date of Birth: 05/12/1941

## 2017-06-29 ENCOUNTER — Encounter: Payer: Self-pay | Admitting: Family Medicine

## 2017-06-29 ENCOUNTER — Ambulatory Visit (INDEPENDENT_AMBULATORY_CARE_PROVIDER_SITE_OTHER): Payer: Medicare HMO | Admitting: Family Medicine

## 2017-06-29 VITALS — BP 137/76 | HR 96 | Temp 98.6°F | Ht 63.0 in | Wt 207.0 lb

## 2017-06-29 DIAGNOSIS — J0111 Acute recurrent frontal sinusitis: Secondary | ICD-10-CM | POA: Diagnosis not present

## 2017-06-29 DIAGNOSIS — R52 Pain, unspecified: Secondary | ICD-10-CM | POA: Diagnosis not present

## 2017-06-29 LAB — VERITOR FLU A/B WAIVED
INFLUENZA A: NEGATIVE
Influenza B: NEGATIVE

## 2017-06-29 MED ORDER — CEFDINIR 300 MG PO CAPS: 300.0000 mg | ORAL_CAPSULE | Freq: Two times a day (BID) | ORAL | 0 refills | Status: DC

## 2017-06-29 NOTE — Progress Notes (Signed)
BP 137/76   Pulse 96   Temp 98.6 F (37 C) (Oral)   Ht 5\' 3"  (1.6 m)   Wt 207 lb (93.9 kg)   BMI 36.67 kg/m    Subjective:    Patient ID: Melissa Zavala, female    DOB: 1940/11/28, 77 y.o.   MRN: [id number]  HPI: Melissa Zavala is a 77 y.o. female presenting on 06/29/2017 for Headache, body aches, sinus and nasal congestion, cough (x 2 days)   HPI Headache and congestion and sinus pressure Headache sinus congestion and sinus pressure and cough that has been going on for the past 2 days.  Patient also complains of body aches sinus drainage and postnasal drainage.  She denies having any fevers or chills.  She denies any shortness of breath or wheezing.  She feels like it has been worsening.  She is concerned that it could be turning into something bad.  She has not tried any over-the-counter stuff yet.  Relevant past medical, surgical, family and social history reviewed and updated as indicated. Interim medical history since our last visit reviewed. Allergies and medications reviewed and updated.  Review of Systems  Constitutional: Negative for chills and fever.  HENT: Positive for congestion, postnasal drip, rhinorrhea, sinus pressure, sneezing and sore throat. Negative for ear discharge and ear pain.   Eyes: Negative for pain, redness and visual disturbance.  Respiratory: Positive for cough. Negative for chest tightness, shortness of breath and wheezing.   Cardiovascular: Negative for chest pain and leg swelling.  Genitourinary: Negative for difficulty urinating and dysuria.  Musculoskeletal: Negative for back pain and gait problem.  Skin: Negative for rash.  Neurological: Negative for light-headedness and headaches.  Psychiatric/Behavioral: Negative for agitation and behavioral problems.  All other systems reviewed and are negative.   Per HPI unless specifically indicated above   Allergies as of 06/29/2017      Reactions   Iodinated Diagnostic Agents Anaphylaxis   Codeine Other (See Comments)   Hallucinations   Diphenhydramine Other (See Comments)   Patient states "feels like somebody is inside of me clawing me out"      Medication List        Accurate as of 06/29/17 12:45 PM. Always use your most recent med list.          amLODipine 10 MG tablet Commonly known as:  NORVASC Take 1 tablet (10 mg total) by mouth daily.   cefdinir 300 MG capsule Commonly known as:  OMNICEF Take 1 capsule (300 mg total) by mouth 2 (two) times daily. 1 po BID   celecoxib 200 MG capsule Commonly known as:  CELEBREX TAKE (1) CAPSULE DAILY.   esomeprazole 40 MG capsule Commonly known as:  NEXIUM TAKE (1) CAPSULE DAILY.   furosemide 20 MG tablet Commonly known as:  LASIX Take 1 tablet (20 mg total) by mouth daily. WHEN LOTS OF SWELLING   hydrochlorothiazide 25 MG tablet Commonly known as:  HYDRODIURIL Take 1 tablet (25 mg total) by mouth daily.   lisinopril 30 MG tablet Commonly known as:  PRINIVIL,ZESTRIL Take 1 tablet (30 mg total) by mouth daily.   mupirocin ointment 2 % Commonly known as:  BACTROBAN Place 1 application into the nose 3 (three) times daily.   potassium chloride 10 MEQ tablet Commonly known as:  K-DUR Take 1 tablet (10 mEq total) by mouth daily.   traMADol 50 MG tablet Commonly known as:  ULTRAM Take 2 tablets (100 mg total) by mouth daily.  Objective:    BP 137/76   Pulse 96   Temp 98.6 F (37 C) (Oral)   Ht 5\' 3"  (1.6 m)   Wt 207 lb (93.9 kg)   BMI 36.67 kg/m   Wt Readings from Last 3 Encounters:  06/29/17 207 lb (93.9 kg)  05/30/17 211 lb (95.7 kg)  03/30/17 210 lb (95.3 kg)    Physical Exam  Constitutional: She is oriented to person, place, and time. She appears well-developed and well-nourished. No distress.  HENT:  Right Ear: Tympanic membrane, external ear and ear canal normal.  Left Ear: Tympanic membrane, external ear and ear canal normal.  Nose: Mucosal edema and rhinorrhea present. No  epistaxis. Right sinus exhibits no maxillary sinus tenderness and no frontal sinus tenderness. Left sinus exhibits no maxillary sinus tenderness and no frontal sinus tenderness.  Mouth/Throat: Uvula is midline and mucous membranes are normal. Posterior oropharyngeal edema and posterior oropharyngeal erythema present. No oropharyngeal exudate or tonsillar abscesses.  Eyes: Conjunctivae and EOM are normal.  Neck: Neck supple. No thyromegaly present.  Cardiovascular: Normal rate, regular rhythm, normal heart sounds and intact distal pulses.  No murmur heard. Pulmonary/Chest: Effort normal and breath sounds normal. No respiratory distress. She has no wheezes. She has no rales.  Musculoskeletal: Normal range of motion. She exhibits no edema.  Lymphadenopathy:    She has no cervical adenopathy.  Neurological: She is alert and oriented to person, place, and time. Coordination normal.  Skin: Skin is warm and dry. No rash noted. She is not diaphoretic.  Psychiatric: She has a normal mood and affect. Her behavior is normal.  Vitals reviewed.  Influenza negative    Assessment & Plan:   Problem List Items Addressed This Visit    None    Visit Diagnoses    Acute recurrent frontal sinusitis    -  Primary   Relevant Medications   cefdinir (OMNICEF) 300 MG capsule   Other Relevant Orders   Veritor Flu A/B Waived (Completed)       Follow up plan: Return if symptoms worsen or fail to improve.  Counseling provided for all of the vaccine components Orders Placed This Encounter  Procedures  . Veritor Flu A/B Ulyses Southward, MD [facility] 06/29/2017, 12:45 PM

## 2017-07-03 ENCOUNTER — Ambulatory Visit: Payer: Medicare HMO | Admitting: Physical Therapy

## 2017-07-03 ENCOUNTER — Encounter: Payer: Self-pay | Admitting: Physical Therapy

## 2017-07-03 DIAGNOSIS — G8929 Other chronic pain: Secondary | ICD-10-CM | POA: Diagnosis not present

## 2017-07-03 DIAGNOSIS — M25661 Stiffness of right knee, not elsewhere classified: Secondary | ICD-10-CM

## 2017-07-03 DIAGNOSIS — M25561 Pain in right knee: Secondary | ICD-10-CM | POA: Diagnosis not present

## 2017-07-03 DIAGNOSIS — M25662 Stiffness of left knee, not elsewhere classified: Secondary | ICD-10-CM | POA: Diagnosis not present

## 2017-07-03 DIAGNOSIS — M25562 Pain in left knee: Secondary | ICD-10-CM | POA: Diagnosis not present

## 2017-07-03 NOTE — Therapy (Signed)
[facility] [place], [place], [address] Phone: [phone]   Fax:  [phone]  Physical Therapy Treatment  Patient Details  Name: Melissa Zavala MRN: [id number] Date of Birth: 09/04/1940 Referring Provider: Hervey Ard, PA-C   Encounter Date: 07/03/2017  PT End of Session - 07/03/17 1054    Visit Number  3    Number of Visits  18    Date for PT Re-Evaluation  08/07/17    PT Start Time  0945    PT Stop Time  1029    PT Time Calculation (min)  44 min    Activity Tolerance  Patient tolerated treatment well    Behavior During Therapy  [facility] for tasks assessed/performed       Past Medical History:  Diagnosis Date  . Arthritis   . Hypertension     Past Surgical History:  Procedure Laterality Date  . TOTAL KNEE ARTHROPLASTY    . TOTAL KNEE ARTHROPLASTY Bilateral   . TUBAL LIGATION      There were no vitals filed for this visit.  Subjective Assessment - 07/03/17 1056    Subjective  I'm doing okay today.  Patient wants focus to be on right knee.    Patient Stated Goals  "get better"                      [facility] Adult PT Treatment/Exercise - 07/03/17 0001      Exercises   Exercises  Knee/Hip      Knee/Hip Exercises: Aerobic   Nustep  level 3 x 15 minutes.      Knee/Hip Exercises: Seated   Long Arc Quad Limitations  3# x 3 minutes on right.      Modalities   Modalities  Electrical Stimulation;Vasopneumatic      Electrical Stimulation   Electrical Stimulation Location  Right distal patellar tendon region.    Electrical Stimulation Action  Pre-mod.    Electrical Stimulation Parameters  80-150 Hz x 15 minutes.    Electrical Stimulation Goals  Pain      Manual Therapy   Manual Therapy  Soft tissue mobilization    Manual therapy comments  Perform STW/M to right ditsal patellar tendon region in which she was found to be very tender (5 minutes).               PT Short Term Goals -  06/26/17 1442      PT SHORT TERM GOAL #1   Title  STG=LTG        PT Long Term Goals - 06/26/17 1442      PT LONG TERM GOAL #1   Title  Patient will be independent with HEP    Time  6    Period  Weeks    Target Date  08/07/17      PT LONG TERM GOAL #2   Title  Patient will improve Bilateral knee flexion strength to 5/5 to perform functional activites    Baseline  4+/5    Time  6    Period  Weeks    Status  New    Target Date  08/07/17      PT LONG TERM GOAL #3   Title  Patient will report less than or equal to 2/10 pain in right knee when transitioning from sit to stand.    Time  6    Period  Weeks    Status  New    Target Date  08/07/17      PT LONG TERM GOAL #4   Title  Patient will improve bilateral knee flexion AROM to 115 degrees to allow adeuate movement for ADLs and functional activities.    Baseline  93    Time  6    Period  Weeks    Status  New    Target Date  08/07/17            Plan - 07/03/17 1106    Clinical Impression Statement  The patient was palpably tender in the area of the right infrapatellar bursa.  She did well with treatment today and felt better after treatment.       Patient will benefit from skilled therapeutic intervention in order to improve the following deficits and impairments:     Visit Diagnosis: Chronic pain of right knee  Stiffness of right knee, not elsewhere classified     Problem List Patient Active Problem List   Diagnosis Date Noted  . Acute pain of right knee 05/30/2017  . Essential hypertension 02/19/2017  . Gastroesophageal reflux disease without esophagitis 06/11/2016  . Personal history of ovarian cyst 06/11/2016  . Pain in joint involving left pelvic region and thigh 06/11/2016  . Age-related osteoporosis without current pathological fracture 06/11/2016  . Screening for breast cancer 06/11/2016  . Bilateral carpal tunnel syndrome 06/09/2016  . Generalized OA 06/09/2016  . Peripheral edema 01/03/2016   . Morbid obesity with BMI of 40.0-44.9, adult ([place]) 01/03/2016  . Right shoulder pain 07/06/2015    Michiel Sivley, [place]  MPT 07/03/2017, 11:08 AM  [facility] [address] [place], [place], [address] Phone: [phone]   Fax:  [phone]  Name: JORDANE HISLE MRN: [id number] Date of Birth: 1941/05/07

## 2017-07-05 ENCOUNTER — Ambulatory Visit: Payer: Medicare HMO | Admitting: Physical Therapy

## 2017-07-05 ENCOUNTER — Encounter: Payer: Self-pay | Admitting: Physical Therapy

## 2017-07-05 DIAGNOSIS — M25662 Stiffness of left knee, not elsewhere classified: Secondary | ICD-10-CM | POA: Diagnosis not present

## 2017-07-05 DIAGNOSIS — M25561 Pain in right knee: Secondary | ICD-10-CM | POA: Diagnosis not present

## 2017-07-05 DIAGNOSIS — M25562 Pain in left knee: Secondary | ICD-10-CM

## 2017-07-05 DIAGNOSIS — G8929 Other chronic pain: Secondary | ICD-10-CM

## 2017-07-05 DIAGNOSIS — M25661 Stiffness of right knee, not elsewhere classified: Secondary | ICD-10-CM

## 2017-07-05 NOTE — Therapy (Signed)
[facility] [place], [place], [address] Phone: [phone]   Fax:  [phone]  Physical Therapy Treatment  Patient Details  Name: Melissa Zavala MRN: [id number] Date of Birth: Dec 26, 1940 Referring Provider: Hervey Ard, PA-C   Encounter Date: 07/05/2017  PT End of Session - 07/05/17 0850    Visit Number  4    Number of Visits  18    Date for PT Re-Evaluation  08/07/17    PT Start Time  0816    PT Stop Time  0906    PT Time Calculation (min)  50 min    Activity Tolerance  Patient tolerated treatment well    Behavior During Therapy  [facility] for tasks assessed/performed       Past Medical History:  Diagnosis Date  . Arthritis   . Hypertension     Past Surgical History:  Procedure Laterality Date  . TOTAL KNEE ARTHROPLASTY    . TOTAL KNEE ARTHROPLASTY Bilateral   . TUBAL LIGATION      There were no vitals filed for this visit.  Subjective Assessment - 07/05/17 0819    Subjective  Patient reported no complaints upon arrival    Limitations  Sitting;Walking    How long can you sit comfortably?  Less than 1 hr    Patient Stated Goals  "get better"    Currently in Pain?  Yes    Pain Score  5     Pain Location  Knee    Pain Orientation  Right;Left    Pain Descriptors / Indicators  Sore    Pain Type  Chronic pain    Pain Onset  More than a month ago    Pain Frequency  Constant    Aggravating Factors   prolong activity, or prolong seated position    Pain Relieving Factors  at rest and movement to avoid stiffness in knees                      [facility] Adult PT Treatment/Exercise - 07/05/17 0001      Knee/Hip Exercises: Aerobic   Nustep  level 3 x 15 minutes.      Knee/Hip Exercises: Seated   Long Arc Quad  Strengthening;Both;3 sets;10 reps;Weights;Limitations    Long Arc Quad Weight  3 lbs.      Knee/Hip Exercises: Supine   Bridges  Strengthening;Both;1 set;10 reps    Straight Leg Raises   Strengthening;Both;20 reps    Other Supine Knee/Hip Exercises  hip abd with red t-band x30      Electrical Stimulation   Electrical Stimulation Location  bil knee    Electrical Stimulation Action  premod    Electrical Stimulation Parameters  80-150hz  x2min    Electrical Stimulation Goals  Pain      Manual Therapy   Manual Therapy  Passive ROM    Passive ROM  gentle PROM bil knee flexion with gentle range, hard end feel on left knee today               PT Short Term Goals - 06/26/17 1442      PT SHORT TERM GOAL #1   Title  STG=LTG        PT Long Term Goals - 07/05/17 4270      PT LONG TERM GOAL #1   Title  Patient will be independent with HEP    Time  6    Period  Weeks    Status  On-going      PT LONG TERM GOAL #2   Title  Patient will improve Bilateral knee flexion strength to 5/5 to perform functional activites    Baseline  4+/5    Time  6    Period  Weeks    Status  On-going      PT LONG TERM GOAL #3   Title  Patient will report less than or equal to 2/10 pain in right knee when transitioning from sit to stand.    Time  6    Period  Weeks    Status  On-going      PT LONG TERM GOAL #4   Title  Patient will improve bilateral knee flexion AROM to 115 degrees to allow adeuate movement for ADLs and functional activities.    Baseline  93    Time  6    Period  Weeks    Status  On-going            Plan - 07/05/17 6195    Clinical Impression Statement  Patient tolerated treatment well today. Patient able to progress with exercises for bil knee strengthening today. Performed very gentle PROM for bil knee flexion with hard end feel on left knee today. Patient reported ongoing soreness in bil knees. Patient current goals ongoing.     Rehab Potential  Good    PT Frequency  3x / week    PT Duration  6 weeks    PT Treatment/Interventions  ADLs/Self Care Home Management;Electrical Stimulation;Iontophoresis 4mg /ml Dexamethasone;Ultrasound;Therapeutic  exercise;Therapeutic activities;Functional mobility training;Gait training;Stair training;Balance training;Neuromuscular re-education;Patient/family education;Manual techniques;Passive range of motion;Vasopneumatic Device    PT Next Visit Plan  cont with POC for knee strengthening and ROM, Modalities PRN.    Consulted and Agree with Plan of Care  Patient       Patient will benefit from skilled therapeutic intervention in order to improve the following deficits and impairments:  Abnormal gait, Pain, Decreased range of motion, Decreased strength  Visit Diagnosis: Chronic pain of right knee  Stiffness of right knee, not elsewhere classified  Chronic pain of left knee  Stiffness of left knee, not elsewhere classified     Problem List Patient Active Problem List   Diagnosis Date Noted  . Acute pain of right knee 05/30/2017  . Essential hypertension 02/19/2017  . Gastroesophageal reflux disease without esophagitis 06/11/2016  . Personal history of ovarian cyst 06/11/2016  . Pain in joint involving left pelvic region and thigh 06/11/2016  . Age-related osteoporosis without current pathological fracture 06/11/2016  . Screening for breast cancer 06/11/2016  . Bilateral carpal tunnel syndrome 06/09/2016  . Generalized OA 06/09/2016  . Peripheral edema 01/03/2016  . Morbid obesity with BMI of 40.0-44.9, adult ([place]) 01/03/2016  . Right shoulder pain 07/06/2015    Phillips Climes, PTA 07/05/2017, 9:12 AM  [facility] [place], [place], [address] Phone: [phone]   Fax:  [phone]  Name: Melissa Zavala MRN: [id number] Date of Birth: [birth year]

## 2017-07-09 ENCOUNTER — Ambulatory Visit: Payer: Medicare HMO | Admitting: Physical Therapy

## 2017-07-09 ENCOUNTER — Encounter: Payer: Self-pay | Admitting: Physical Therapy

## 2017-07-09 DIAGNOSIS — M25561 Pain in right knee: Principal | ICD-10-CM

## 2017-07-09 DIAGNOSIS — M25562 Pain in left knee: Secondary | ICD-10-CM

## 2017-07-09 DIAGNOSIS — M25661 Stiffness of right knee, not elsewhere classified: Secondary | ICD-10-CM

## 2017-07-09 DIAGNOSIS — M25662 Stiffness of left knee, not elsewhere classified: Secondary | ICD-10-CM | POA: Diagnosis not present

## 2017-07-09 DIAGNOSIS — G8929 Other chronic pain: Secondary | ICD-10-CM

## 2017-07-09 NOTE — Therapy (Signed)
[facility] [place], [place], [address] Phone: [phone]   Fax:  [phone]  Physical Therapy Treatment  Patient Details  Name: Melissa Zavala MRN: [id number] Date of Birth: 04-21-1941 Referring Provider: Hervey Ard, PA-C   Encounter Date: 07/09/2017  PT End of Session - 07/09/17 0932    Visit Number  5    Number of Visits  18    Date for PT Re-Evaluation  08/07/17    PT Start Time  0901    PT Stop Time  0958    PT Time Calculation (min)  57 min    Activity Tolerance  Patient tolerated treatment well    Behavior During Therapy  [facility] for tasks assessed/performed       Past Medical History:  Diagnosis Date  . Arthritis   . Hypertension     Past Surgical History:  Procedure Laterality Date  . TOTAL KNEE ARTHROPLASTY    . TOTAL KNEE ARTHROPLASTY Bilateral   . TUBAL LIGATION      There were no vitals filed for this visit.  Subjective Assessment - 07/09/17 0902    Subjective  Patient reported doing well after last treatment and some ongoing discomfort currently    Limitations  Sitting;Walking    How long can you sit comfortably?  Less than 1 hr    Patient Stated Goals  "get better"    Currently in Pain?  Yes    Pain Score  4     Pain Location  Knee    Pain Orientation  Right;Left    Pain Descriptors / Indicators  Sore    Pain Type  Chronic pain    Pain Onset  More than a month ago    Pain Frequency  Constant    Aggravating Factors   prolong activity or prolong sitting    Pain Relieving Factors  rest and movement to avoid stiff knee         [facility] PT Assessment - 07/09/17 0001      AROM   AROM Assessment Site  Knee    Right/Left Knee  Right;Left    Right Knee Flexion  94    Left Knee Flexion  80      PROM   PROM Assessment Site  Knee    Right/Left Knee  Right;Left    Right Knee Flexion  100    Left Knee Flexion  82                  [facility] Adult PT Treatment/Exercise - 07/09/17  0001      Knee/Hip Exercises: Stretches   Knee: Self-Stretch to increase Flexion  Both;2 reps;20 seconds      Knee/Hip Exercises: Aerobic   Nustep  level 4 x 15 minutes.      Knee/Hip Exercises: Seated   Long Arc Quad  Strengthening;Both;3 sets;10 reps;Weights;Limitations    Long Arc Quad Weight  3 lbs.      Knee/Hip Exercises: Supine   Short Arc Quad Sets  Strengthening;Both;3 sets;10 reps;Limitations    Short Arc Quad Sets Limitations  3#    Bridges  Strengthening;Both;10 reps;2 sets    Straight Leg Raises  Strengthening;Both;20 reps    Other Supine Knee/Hip Exercises  hip abd with red t-band x30      Electrical Stimulation   Electrical Stimulation Location  bil knee    Electrical Stimulation Action  premod    Electrical Stimulation Parameters  80-150hz  x63min    Electrical Stimulation  Goals  Pain      Manual Therapy   Manual Therapy  Passive ROM    Passive ROM  gentle PROM bil knee flexion with gentle range, hard end feel on left knee                PT Short Term Goals - 06/26/17 1442      PT SHORT TERM GOAL #1   Title  STG=LTG        PT Long Term Goals - 07/05/17 9381      PT LONG TERM GOAL #1   Title  Patient will be independent with HEP    Time  6    Period  Weeks    Status  On-going      PT LONG TERM GOAL #2   Title  Patient will improve Bilateral knee flexion strength to 5/5 to perform functional activites    Baseline  4+/5    Time  6    Period  Weeks    Status  On-going      PT LONG TERM GOAL #3   Title  Patient will report less than or equal to 2/10 pain in right knee when transitioning from sit to stand.    Time  6    Period  Weeks    Status  On-going      PT LONG TERM GOAL #4   Title  Patient will improve bilateral knee flexion AROM to 115 degrees to allow adeuate movement for ADLs and functional activities.    Baseline  93    Time  6    Period  Weeks    Status  On-going            Plan - 07/09/17 0175    Clinical  Impression Statement  Patient tolerated treatment well today. Patient able to progress bil LE strengthenng activities. Patient has firm end feel with left knee flexion and limited with ROM, Patient able to increase right knee flexion after PROM today. Patient reported increased pain if any prolong sitting or standing activity. Current goals ongoing due to pain, strength, and ROM deficts.     Rehab Potential  Good    PT Frequency  3x / week    PT Duration  6 weeks    PT Treatment/Interventions  ADLs/Self Care Home Management;Electrical Stimulation;Iontophoresis 4mg /ml Dexamethasone;Ultrasound;Therapeutic exercise;Therapeutic activities;Functional mobility training;Gait training;Stair training;Balance training;Neuromuscular re-education;Patient/family education;Manual techniques;Passive range of motion;Vasopneumatic Device    PT Next Visit Plan  cont with POC for knee strengthening and ROM, Modalities PRN.    Consulted and Agree with Plan of Care  Patient       Patient will benefit from skilled therapeutic intervention in order to improve the following deficits and impairments:  Abnormal gait, Pain, Decreased range of motion, Decreased strength  Visit Diagnosis: Chronic pain of right knee  Stiffness of right knee, not elsewhere classified  Chronic pain of left knee  Stiffness of left knee, not elsewhere classified     Problem List Patient Active Problem List   Diagnosis Date Noted  . Acute pain of right knee 05/30/2017  . Essential hypertension 02/19/2017  . Gastroesophageal reflux disease without esophagitis 06/11/2016  . Personal history of ovarian cyst 06/11/2016  . Pain in joint involving left pelvic region and thigh 06/11/2016  . Age-related osteoporosis without current pathological fracture 06/11/2016  . Screening for breast cancer 06/11/2016  . Bilateral carpal tunnel syndrome 06/09/2016  . Generalized OA 06/09/2016  . Peripheral edema 01/03/2016  .  Morbid obesity with BMI  of 40.0-44.9, adult ([place]) 01/03/2016  . Right shoulder pain 07/06/2015    Phillips Climes, PTA 07/09/2017, 9:59 AM  [facility] [place], [place], [address] Phone: [phone]   Fax:  [phone]  Name: JULEY GIOVANETTI MRN: [id number] Date of Birth: 1941/03/06

## 2017-07-12 ENCOUNTER — Encounter: Payer: Self-pay | Admitting: Physical Therapy

## 2017-07-12 ENCOUNTER — Ambulatory Visit: Payer: Medicare HMO | Admitting: Physical Therapy

## 2017-07-12 DIAGNOSIS — M25662 Stiffness of left knee, not elsewhere classified: Secondary | ICD-10-CM | POA: Diagnosis not present

## 2017-07-12 DIAGNOSIS — M25562 Pain in left knee: Secondary | ICD-10-CM | POA: Diagnosis not present

## 2017-07-12 DIAGNOSIS — G8929 Other chronic pain: Secondary | ICD-10-CM | POA: Diagnosis not present

## 2017-07-12 DIAGNOSIS — M25561 Pain in right knee: Principal | ICD-10-CM

## 2017-07-12 DIAGNOSIS — M25661 Stiffness of right knee, not elsewhere classified: Secondary | ICD-10-CM

## 2017-07-12 NOTE — Therapy (Signed)
[facility] [place], [place], [address] Phone: [phone]   Fax:  [phone]  Physical Therapy Treatment  Patient Details  Name: Melissa Zavala MRN: [id number] Date of Birth: 1940/10/06 Referring Provider: Hervey Ard, PA-C   Encounter Date: 07/12/2017  PT End of Session - 07/12/17 1253    Visit Number  6    Number of Visits  18    Date for PT Re-Evaluation  08/07/17    PT Start Time  0947    PT Stop Time  1039    PT Time Calculation (min)  52 min    Activity Tolerance  Patient tolerated treatment well    Behavior During Therapy  [facility] for tasks assessed/performed       Past Medical History:  Diagnosis Date  . Arthritis   . Hypertension     Past Surgical History:  Procedure Laterality Date  . TOTAL KNEE ARTHROPLASTY    . TOTAL KNEE ARTHROPLASTY Bilateral   . TUBAL LIGATION      There were no vitals filed for this visit.  Subjective Assessment - 07/12/17 1253    Subjective  My right knee hurts a lot when I'm driving and it locks.    Pain Score  5     Pain Location  Knee    Pain Orientation  Right    Pain Descriptors / Indicators  Sharp;Sore    Pain Type  Chronic pain    Pain Onset  More than a month ago                      [facility] Adult PT Treatment/Exercise - 07/12/17 0001      Exercises   Exercises  Knee/Hip      Knee/Hip Exercises: Aerobic   Nustep  Level 5 x 15 minutes.      Modalities   Modalities  Electrical Stimulation;Vasopneumatic      Electrical Stimulation   Electrical Stimulation Location  Right anterior-medial joint line region.    Electrical Stimulation Action  Pre-mod.    Electrical Stimulation Parameters  80-150 hz x 15 minutes.    Electrical Stimulation Goals  Pain      Vasopneumatic   Number Minutes Vasopneumatic   15 minutes    Vasopnuematic Location   -- Right knee.    Vasopneumatic Pressure  Medium      Manual Therapy   Manual Therapy  Soft tissue  mobilization    Passive ROM  STW/M including IASTM x 8 minutes to left anterior-medial joint line.               PT Short Term Goals - 06/26/17 1442      PT SHORT TERM GOAL #1   Title  STG=LTG        PT Long Term Goals - 07/05/17 2423      PT LONG TERM GOAL #1   Title  Patient will be independent with HEP    Time  6    Period  Weeks    Status  On-going      PT LONG TERM GOAL #2   Title  Patient will improve Bilateral knee flexion strength to 5/5 to perform functional activites    Baseline  4+/5    Time  6    Period  Weeks    Status  On-going      PT LONG TERM GOAL #3   Title  Patient will report less than or equal to  2/10 pain in right knee when transitioning from sit to stand.    Time  6    Period  Weeks    Status  On-going      PT LONG TERM GOAL #4   Title  Patient will improve bilateral knee flexion AROM to 115 degrees to allow adeuate movement for ADLs and functional activities.    Baseline  93    Time  6    Period  Weeks    Status  On-going            Plan - 07/12/17 1257    Clinical Impression Statement  Patient very tender to palption in area of right anterior-medial joint line region.  Patient c/o locking of her right knee.  She has an appointment with anOrthopedic region in May.    PT Treatment/Interventions  ADLs/Self Care Home Management;Electrical Stimulation;Iontophoresis 4mg /ml Dexamethasone;Ultrasound;Therapeutic exercise;Therapeutic activities;Functional mobility training;Gait training;Stair training;Balance training;Neuromuscular re-education;Patient/family education;Manual techniques;Passive range of motion;Vasopneumatic Device    PT Next Visit Plan  cont with POC for knee strengthening and ROM, Modalities PRN.    PT Home Exercise Plan  LAQ, heel slides, seated marching    Consulted and Agree with Plan of Care  Patient       Patient will benefit from skilled therapeutic intervention in order to improve the following deficits and  impairments:  Abnormal gait, Pain, Decreased range of motion, Decreased strength  Visit Diagnosis: Chronic pain of right knee  Stiffness of right knee, not elsewhere classified     Problem List Patient Active Problem List   Diagnosis Date Noted  . Acute pain of right knee 05/30/2017  . Essential hypertension 02/19/2017  . Gastroesophageal reflux disease without esophagitis 06/11/2016  . Personal history of ovarian cyst 06/11/2016  . Pain in joint involving left pelvic region and thigh 06/11/2016  . Age-related osteoporosis without current pathological fracture 06/11/2016  . Screening for breast cancer 06/11/2016  . Bilateral carpal tunnel syndrome 06/09/2016  . Generalized OA 06/09/2016  . Peripheral edema 01/03/2016  . Morbid obesity with BMI of 40.0-44.9, adult ([place]) 01/03/2016  . Right shoulder pain 07/06/2015    Melondy Blanchard, [place] MPT 07/12/2017, 12:59 PM  [facility] [place], [place], [address] Phone: [phone]   Fax:  [phone]  Name: Melissa Zavala MRN: [id number] Date of Birth: 10-May-1941

## 2017-07-16 ENCOUNTER — Ambulatory Visit: Payer: Medicare HMO | Attending: Physician Assistant | Admitting: Physical Therapy

## 2017-07-16 ENCOUNTER — Encounter: Payer: Self-pay | Admitting: Physical Therapy

## 2017-07-16 ENCOUNTER — Telehealth: Payer: Self-pay | Admitting: Family Medicine

## 2017-07-16 ENCOUNTER — Other Ambulatory Visit: Payer: Self-pay | Admitting: Family Medicine

## 2017-07-16 DIAGNOSIS — G8929 Other chronic pain: Secondary | ICD-10-CM | POA: Insufficient documentation

## 2017-07-16 DIAGNOSIS — M25562 Pain in left knee: Secondary | ICD-10-CM | POA: Diagnosis not present

## 2017-07-16 DIAGNOSIS — M25661 Stiffness of right knee, not elsewhere classified: Secondary | ICD-10-CM | POA: Insufficient documentation

## 2017-07-16 DIAGNOSIS — M25561 Pain in right knee: Secondary | ICD-10-CM | POA: Insufficient documentation

## 2017-07-16 DIAGNOSIS — M25662 Stiffness of left knee, not elsewhere classified: Secondary | ICD-10-CM | POA: Diagnosis not present

## 2017-07-16 MED ORDER — FLUCONAZOLE 150 MG PO TABS: 150.0000 mg | ORAL_TABLET | Freq: Once | ORAL | 0 refills | Status: AC

## 2017-07-16 NOTE — Telephone Encounter (Signed)
What symptoms do you have? Itching Patient has been on ABX and wants Diflucan  How long have you been sick? One week  Have you been seen for this problem? NO  If your provider decides to give you a prescription, which pharmacy would you like for it to be sent to? [facility]   Patient informed that this information will be sent to the clinical staff for review and that they should receive a follow up call.

## 2017-07-16 NOTE — Patient Instructions (Signed)

## 2017-07-16 NOTE — Telephone Encounter (Signed)
pt aware 

## 2017-07-16 NOTE — Telephone Encounter (Signed)
I sent in the requested prescription 

## 2017-07-16 NOTE — Telephone Encounter (Signed)
Please review and advise.

## 2017-07-16 NOTE — Therapy (Signed)
[facility] [place], [place], [address] Phone: [phone]   Fax:  [phone]  Physical Therapy Treatment  Patient Details  Name: Melissa Zavala MRN: [id number] Date of Birth: 1940/07/28 Referring Provider: Hervey Ard, PA-C   Encounter Date: 07/16/2017  PT End of Session - 07/16/17 1004    Visit Number  7    Number of Visits  18    Date for PT Re-Evaluation  08/07/17    PT Start Time  0947    PT Stop Time  1041    PT Time Calculation (min)  54 min    Activity Tolerance  Patient tolerated treatment well    Behavior During Therapy  [facility] for tasks assessed/performed       Past Medical History:  Diagnosis Date  . Arthritis   . Hypertension     Past Surgical History:  Procedure Laterality Date  . TOTAL KNEE ARTHROPLASTY    . TOTAL KNEE ARTHROPLASTY Bilateral   . TUBAL LIGATION      There were no vitals filed for this visit.  Subjective Assessment - 07/16/17 1002    Subjective  Reports she can tell PT is helping and reports that if she sits for a while she has locking. Reports that R TKR was 14-15 years ago.    Limitations  Sitting;Walking    How long can you sit comfortably?  Less than 1 hr    Patient Stated Goals  "get better"    Currently in Pain?  Yes    Pain Score  6     Pain Location  Knee    Pain Orientation  Right    Pain Descriptors / Indicators  Other (Comment) "locked"    Pain Type  Chronic pain    Pain Onset  More than a month ago    Pain Frequency  Intermittent    Aggravating Factors   Prolonged sitting    Pain Relieving Factors  Movement                       [facility] Adult PT Treatment/Exercise - 07/16/17 0001      Knee/Hip Exercises: Aerobic   Nustep  Level 5 x 15 minutes.      Knee/Hip Exercises: Standing   Rocker Board  4 minutes secondary to stretch reported in posterior LEs during sittin      Knee/Hip Exercises: Seated   Long Arc Quad  Strengthening;Both;3 sets;10  reps;Weights    Long Arc Quad Weight  3 lbs.    Hamstring Curl  Strengthening;Both;3 sets;10 reps;Limitations    Hamstring Limitations  red theraband      Modalities   Modalities  Psychologist, educational Location  R knee    Electrical Stimulation Action  IFC    Electrical Stimulation Parameters  1-10 hz x15 min    Electrical Stimulation Goals  Pain      Vasopneumatic   Number Minutes Vasopneumatic   15 minutes    Vasopnuematic Location   Knee    Vasopneumatic Pressure  Medium    Vasopneumatic Temperature   34             PT Education - 07/16/17 1009    Education provided  --    Education Details  --    Forensic psychologist) Educated  --    Methods  --    Comprehension  --  PT Short Term Goals - 06/26/17 1442      PT SHORT TERM GOAL #1   Title  STG=LTG        PT Long Term Goals - 07/05/17 4098      PT LONG TERM GOAL #1   Title  Patient will be independent with HEP    Time  6    Period  Weeks    Status  On-going      PT LONG TERM GOAL #2   Title  Patient will improve Bilateral knee flexion strength to 5/5 to perform functional activites    Baseline  4+/5    Time  6    Period  Weeks    Status  On-going      PT LONG TERM GOAL #3   Title  Patient will report less than or equal to 2/10 pain in right knee when transitioning from sit to stand.    Time  6    Period  Weeks    Status  On-going      PT LONG TERM GOAL #4   Title  Patient will improve bilateral knee flexion AROM to 115 degrees to allow adeuate movement for ADLs and functional activities.    Baseline  93    Time  6    Period  Weeks    Status  On-going            Plan - 07/16/17 1032    Clinical Impression Statement  Patient arrived to clinic with continued R knee discomfort and locking after prolonged sitting. Patient guided through low level seated exercises with 4 sec hold at end range to emphasize strengthening. With seated  exercises patient reported a stretch in posterior thigh/knee region thus rockerboard completed. Patient reported stretch sensation felt in LEs with rockerboard for calf stretch. Normal modalities response noted following removal of the modalities. Patient provided a red theraband to be added to HEP for further strengthening with previous HEP with instruction for technique.    Rehab Potential  Good    PT Frequency  3x / week    PT Duration  6 weeks    PT Treatment/Interventions  ADLs/Self Care Home Management;Electrical Stimulation;Iontophoresis 4mg /ml Dexamethasone;Ultrasound;Therapeutic exercise;Therapeutic activities;Functional mobility training;Gait training;Stair training;Balance training;Neuromuscular re-education;Patient/family education;Manual techniques;Passive range of motion;Vasopneumatic Device    PT Next Visit Plan  cont with POC for knee strengthening and ROM, Modalities PRN.    PT Home Exercise Plan  LAQ, heel slides, seated marching (red theraband added for LAQ, HS curl)    Consulted and Agree with Plan of Care  Patient       Patient will benefit from skilled therapeutic intervention in order to improve the following deficits and impairments:  Abnormal gait, Pain, Decreased range of motion, Decreased strength  Visit Diagnosis: Chronic pain of right knee  Stiffness of right knee, not elsewhere classified  Chronic pain of left knee  Stiffness of left knee, not elsewhere classified     Problem List Patient Active Problem List   Diagnosis Date Noted  . Acute pain of right knee 05/30/2017  . Essential hypertension 02/19/2017  . Gastroesophageal reflux disease without esophagitis 06/11/2016  . Personal history of ovarian cyst 06/11/2016  . Pain in joint involving left pelvic region and thigh 06/11/2016  . Age-related osteoporosis without current pathological fracture 06/11/2016  . Screening for breast cancer 06/11/2016  . Bilateral carpal tunnel syndrome 06/09/2016  .  Generalized OA 06/09/2016  . Peripheral edema 01/03/2016  . Morbid obesity with  BMI of 40.0-44.9, adult ([place]) 01/03/2016  . Right shoulder pain 07/06/2015    Standley Brooking, PTA 07/16/2017, 10:48 AM  [facility] [address] [place], [place], [address] Phone: [phone]   Fax:  [phone]  Name: SONALI WIVELL MRN: [id number] Date of Birth: [birth year]

## 2017-07-20 ENCOUNTER — Ambulatory Visit: Payer: Medicare HMO | Admitting: Physical Therapy

## 2017-07-20 DIAGNOSIS — M25562 Pain in left knee: Secondary | ICD-10-CM | POA: Diagnosis not present

## 2017-07-20 DIAGNOSIS — G8929 Other chronic pain: Secondary | ICD-10-CM | POA: Diagnosis not present

## 2017-07-20 DIAGNOSIS — M25661 Stiffness of right knee, not elsewhere classified: Secondary | ICD-10-CM | POA: Diagnosis not present

## 2017-07-20 DIAGNOSIS — M25662 Stiffness of left knee, not elsewhere classified: Secondary | ICD-10-CM | POA: Diagnosis not present

## 2017-07-20 DIAGNOSIS — M25561 Pain in right knee: Secondary | ICD-10-CM | POA: Diagnosis not present

## 2017-07-20 NOTE — Therapy (Signed)
[facility] [place], [place], [address] Phone: [phone]   Fax:  [phone]  Physical Therapy Treatment  Patient Details  Name: Melissa Zavala MRN: [id number] Date of Birth: 06/10/1940 Referring Provider: Hervey Ard, PA-C   Encounter Date: 07/20/2017  PT End of Session - 07/20/17 0946    Visit Number  8    Number of Visits  18    Date for PT Re-Evaluation  08/07/17    PT Start Time  0945    PT Stop Time  6962    PT Time Calculation (min)  62 min    Activity Tolerance  Patient tolerated treatment well    Behavior During Therapy  [facility] for tasks assessed/performed       Past Medical History:  Diagnosis Date  . Arthritis   . Hypertension     Past Surgical History:  Procedure Laterality Date  . TOTAL KNEE ARTHROPLASTY    . TOTAL KNEE ARTHROPLASTY Bilateral   . TUBAL LIGATION      There were no vitals filed for this visit.  Subjective Assessment - 07/20/17 0946    Subjective  Patient states she is doing very good this week. She says she has no pain today. Its just a little sore, but the evening is when it really bothers her.    Patient Stated Goals  "get better"    Currently in Pain?  No/denies just a little sore                      [facility] Adult PT Treatment/Exercise - 07/20/17 0001      Knee/Hip Exercises: Stretches   Gastroc Stretch  Both;1 rep;20 seconds also toe on wall for gastroc stretch       Knee/Hip Exercises: Aerobic   Nustep  Level 5 x 15 minutes.      Knee/Hip Exercises: Standing   Rocker Board  4 minutes VCs to not bring hips back just drop heels      Knee/Hip Exercises: Seated   Long Arc Quad  Strengthening;Right;10 reps;20 reps;Weights    Long Arc Quad Weight  4 lbs.    Hamstring Curl  Strengthening;Both;10 reps;Limitations;1 set    Hamstring Limitations  pt cramped behind knee so did without band x 20      Modalities   Modalities  Electrical Stimulation;Moist Heat      Moist Heat Therapy   Number Minutes Moist Heat  15 Minutes    Moist Heat Location  Knee posterior R knee      Electrical Stimulation   Electrical Stimulation Location  R knee    Electrical Stimulation Action  IFC    Electrical Stimulation Goals  Pain      Vasopneumatic   Number Minutes Vasopneumatic   --    Vasopnuematic Location   --    Vasopneumatic Pressure  --    Vasopneumatic Temperature   --      Manual Therapy   Manual Therapy  Soft tissue mobilization    Soft tissue mobilization  in sitting to posterior R knee and upper gastroc             PT Education - 07/20/17 1158    Education provided  Yes    Education Details  HEP    Person(s) Educated  Patient    Methods  Explanation;Demonstration;Verbal cues;Handout    Comprehension  Verbalized understanding;Returned demonstration       PT Short Term Goals - 06/26/17 1442  PT SHORT TERM GOAL #1   Title  STG=LTG        PT Long Term Goals - 07/05/17 5638      PT LONG TERM GOAL #1   Title  Patient will be independent with HEP    Time  6    Period  Weeks    Status  On-going      PT LONG TERM GOAL #2   Title  Patient will improve Bilateral knee flexion strength to 5/5 to perform functional activites    Baseline  4+/5    Time  6    Period  Weeks    Status  On-going      PT LONG TERM GOAL #3   Title  Patient will report less than or equal to 2/10 pain in right knee when transitioning from sit to stand.    Time  6    Period  Weeks    Status  On-going      PT LONG TERM GOAL #4   Title  Patient will improve bilateral knee flexion AROM to 115 degrees to allow adeuate movement for ADLs and functional activities.    Baseline  93    Time  6    Period  Weeks    Status  On-going            Plan - 07/20/17 1201    Clinical Impression Statement  Patient did fairly well with treatment today. She was limited somewhat with resisted knee flexion due to HS cramping.    PT Treatment/Interventions   ADLs/Self Care Home Management;Electrical Stimulation;Iontophoresis 4mg /ml Dexamethasone;Ultrasound;Therapeutic exercise;Therapeutic activities;Functional mobility training;Gait training;Stair training;Balance training;Neuromuscular re-education;Patient/family education;Manual techniques;Passive range of motion;Vasopneumatic Device    PT Next Visit Plan  cont with POC for knee strengthening and ROM, Modalities PRN.    PT Home Exercise Plan  LAQ, heel slides, seated marching (red theraband added for LAQ, HS curl); gastroc stretch       Patient will benefit from skilled therapeutic intervention in order to improve the following deficits and impairments:  Abnormal gait, Pain, Decreased range of motion, Decreased strength  Visit Diagnosis: Chronic pain of right knee  Stiffness of right knee, not elsewhere classified     Problem List Patient Active Problem List   Diagnosis Date Noted  . Acute pain of right knee 05/30/2017  . Essential hypertension 02/19/2017  . Gastroesophageal reflux disease without esophagitis 06/11/2016  . Personal history of ovarian cyst 06/11/2016  . Pain in joint involving left pelvic region and thigh 06/11/2016  . Age-related osteoporosis without current pathological fracture 06/11/2016  . Screening for breast cancer 06/11/2016  . Bilateral carpal tunnel syndrome 06/09/2016  . Generalized OA 06/09/2016  . Peripheral edema 01/03/2016  . Morbid obesity with BMI of 40.0-44.9, adult ([place]) 01/03/2016  . Right shoulder pain 07/06/2015   Madelyn Flavors PT 07/20/2017, 12:04 PM  [facility] [place], [place], [address] Phone: [phone]   Fax:  [phone]  Name: CARIANNA LAGUE MRN: [id number] Date of Birth: 1940-12-05

## 2017-07-20 NOTE — Patient Instructions (Signed)
Gastroc Stretch    Stand with right foot back, leg straight, forward leg bent. Keeping heel on floor, turned slightly out, lean into wall until stretch is felt in calf. Hold _30-60___ seconds. Repeat _3___ times per set. Do ____ sets per session. Do ____2 sessions per day.  http://orth.exer.us/27   Copyright  VHI. All rights reserved.   Madelyn Flavors, PT 07/20/17 10:34 AM; [facility] [place], [place], [address] Phone: [phone]   Fax:  [phone]

## 2017-07-24 ENCOUNTER — Encounter: Payer: Medicare HMO | Admitting: Physical Therapy

## 2017-07-25 ENCOUNTER — Encounter: Payer: Self-pay | Admitting: Physical Therapy

## 2017-07-25 ENCOUNTER — Ambulatory Visit: Payer: Medicare HMO | Admitting: Physical Therapy

## 2017-07-25 DIAGNOSIS — M25561 Pain in right knee: Secondary | ICD-10-CM | POA: Diagnosis not present

## 2017-07-25 DIAGNOSIS — M25661 Stiffness of right knee, not elsewhere classified: Secondary | ICD-10-CM

## 2017-07-25 DIAGNOSIS — M25662 Stiffness of left knee, not elsewhere classified: Secondary | ICD-10-CM | POA: Diagnosis not present

## 2017-07-25 DIAGNOSIS — M25562 Pain in left knee: Secondary | ICD-10-CM

## 2017-07-25 DIAGNOSIS — G8929 Other chronic pain: Secondary | ICD-10-CM

## 2017-07-25 NOTE — Therapy (Signed)
[facility] [place], [place], [address] Phone: [phone]   Fax:  [phone]  Physical Therapy Treatment  Patient Details  Name: Melissa Zavala MRN: [id number] Date of Birth: 24-Aug-1940 Referring Provider: Hervey Ard, PA-C   Encounter Date: 07/25/2017  PT End of Session - 07/25/17 0939    Visit Number  9    Number of Visits  18    Date for PT Re-Evaluation  08/07/17    PT Start Time  0901    PT Stop Time  0957    PT Time Calculation (min)  56 min    Activity Tolerance  Patient tolerated treatment well    Behavior During Therapy  [facility] for tasks assessed/performed       Past Medical History:  Diagnosis Date  . Arthritis   . Hypertension     Past Surgical History:  Procedure Laterality Date  . TOTAL KNEE ARTHROPLASTY    . TOTAL KNEE ARTHROPLASTY Bilateral   . TUBAL LIGATION      There were no vitals filed for this visit.  Subjective Assessment - 07/25/17 0912    Subjective  Patient arrived with no pain and doing well overall per reported    Limitations  Sitting;Walking    How long can you sit comfortably?  Less than 1 hr    Patient Stated Goals  "get better"    Currently in Pain?  No/denies                      [facility] Adult PT Treatment/Exercise - 07/25/17 0001      Knee/Hip Exercises: Aerobic   Nustep  Level 5 x 15 minutes.      Knee/Hip Exercises: Machines for Strengthening   Cybex Knee Extension  10# x15    Cybex Knee Flexion  20# x20      Knee/Hip Exercises: Standing   Lateral Step Up  Both;1 set;20 reps;Step Height: 6";2 sets    Rocker Board  3 minutes      Knee/Hip Exercises: Seated   Sit to Sand  20 reps;with UE support;without UE support      Moist Heat Therapy   Number Minutes Moist Heat  15 Minutes    Moist Heat Location  Knee;Other (comment) pre requested      Acupuncturist Location  R knee    Electrical Stimulation Action  IFC  80-150 x84min    Electrical Stimulation Goals  Other (comment) per requested               PT Short Term Goals - 06/26/17 1442      PT SHORT TERM GOAL #1   Title  STG=LTG        PT Long Term Goals - 07/05/17 7494      PT LONG TERM GOAL #1   Title  Patient will be independent with HEP    Time  6    Period  Weeks    Status  On-going      PT LONG TERM GOAL #2   Title  Patient will improve Bilateral knee flexion strength to 5/5 to perform functional activites    Baseline  4+/5    Time  6    Period  Weeks    Status  On-going      PT LONG TERM GOAL #3   Title  Patient will report less than or equal to 2/10 pain in right knee when transitioning from  sit to stand.    Time  6    Period  Weeks    Status  On-going      PT LONG TERM GOAL #4   Title  Patient will improve bilateral knee flexion AROM to 115 degrees to allow adeuate movement for ADLs and functional activities.    Baseline  93    Time  6    Period  Weeks    Status  On-going            Plan - 07/25/17 0940    Clinical Impression Statement  Patient tolerated treatment well today and able to progress bil knee strength progression with no difficulty. Patient has fim end feel with right knee flexion and no improvement with ROM. Patient has reported overall progress and no pain. Patient was able to perform ADL's with greater ease and vacuumed yesterday. Goals progressing yet ongoing. Patient will need recert in the next week or two due to follow up with MD next month.     Rehab Potential  Good    PT Frequency  3x / week    PT Duration  6 weeks    PT Treatment/Interventions  ADLs/Self Care Home Management;Electrical Stimulation;Iontophoresis 4mg /ml Dexamethasone;Ultrasound;Therapeutic exercise;Therapeutic activities;Functional mobility training;Gait training;Stair training;Balance training;Neuromuscular re-education;Patient/family education;Manual techniques;Passive range of motion;Vasopneumatic Device    PT  Next Visit Plan  cont with POC for knee strengthening and ROM, Modalities PRN.    Consulted and Agree with Plan of Care  Patient       Patient will benefit from skilled therapeutic intervention in order to improve the following deficits and impairments:  Abnormal gait, Pain, Decreased range of motion, Decreased strength  Visit Diagnosis: Stiffness of right knee, not elsewhere classified  Chronic pain of right knee  Chronic pain of left knee  Stiffness of left knee, not elsewhere classified     Problem List Patient Active Problem List   Diagnosis Date Noted  . Acute pain of right knee 05/30/2017  . Essential hypertension 02/19/2017  . Gastroesophageal reflux disease without esophagitis 06/11/2016  . Personal history of ovarian cyst 06/11/2016  . Pain in joint involving left pelvic region and thigh 06/11/2016  . Age-related osteoporosis without current pathological fracture 06/11/2016  . Screening for breast cancer 06/11/2016  . Bilateral carpal tunnel syndrome 06/09/2016  . Generalized OA 06/09/2016  . Peripheral edema 01/03/2016  . Morbid obesity with BMI of 40.0-44.9, adult ([place]) 01/03/2016  . Right shoulder pain 07/06/2015    Phillips Climes, PTA 07/25/2017, 10:02 AM  [facility] [place], [place], [address] Phone: [phone]   Fax:  [phone]  Name: KAEDEN DEPAZ MRN: [id number] Date of Birth: 1940/12/21

## 2017-07-27 ENCOUNTER — Encounter: Payer: Self-pay | Admitting: Physical Therapy

## 2017-07-27 ENCOUNTER — Ambulatory Visit: Payer: Medicare HMO | Admitting: Physical Therapy

## 2017-07-27 DIAGNOSIS — G8929 Other chronic pain: Secondary | ICD-10-CM

## 2017-07-27 DIAGNOSIS — M25662 Stiffness of left knee, not elsewhere classified: Secondary | ICD-10-CM | POA: Diagnosis not present

## 2017-07-27 DIAGNOSIS — M25562 Pain in left knee: Secondary | ICD-10-CM

## 2017-07-27 DIAGNOSIS — M25661 Stiffness of right knee, not elsewhere classified: Secondary | ICD-10-CM

## 2017-07-27 DIAGNOSIS — M25561 Pain in right knee: Secondary | ICD-10-CM | POA: Diagnosis not present

## 2017-07-27 NOTE — Therapy (Signed)
[facility] [place], [place], [address] Phone: [phone]   Fax:  [phone]  Physical Therapy Treatment  Patient Details  Name: Melissa Zavala MRN: [id number] Date of Birth: [birth year] Referring Provider: Hervey Ard, PA-C   Encounter Date: 07/27/2017  PT End of Session - 07/27/17 0903    Visit Number  10    Number of Visits  18    Date for PT Re-Evaluation  08/07/17    PT Start Time  0900    PT Stop Time  0950    PT Time Calculation (min)  50 min    Activity Tolerance  Patient tolerated treatment well    Behavior During Therapy  [facility] for tasks assessed/performed       Past Medical History:  Diagnosis Date  . Arthritis   . Hypertension     Past Surgical History:  Procedure Laterality Date  . TOTAL KNEE ARTHROPLASTY    . TOTAL KNEE ARTHROPLASTY Bilateral   . TUBAL LIGATION      There were no vitals filed for this visit.  Subjective Assessment - 07/27/17 0902    Subjective  Reports that her knee had been doing well but reports that last night on the way home her knee began hurting with no known cause.    Limitations  Sitting;Walking    How long can you sit comfortably?  Less than 1 hr    Patient Stated Goals  "get better"    Currently in Pain?  No/denies         [facility] PT Assessment - 07/27/17 0001      Assessment   Medical Diagnosis  Chronic pain in R knee, R knee osteoarthritis,s/p bilateral knee replacement    Next MD Visit  N/A    Prior Therapy  yes      Precautions   Precautions  None      Restrictions   Weight Bearing Restrictions  No      ROM / Strength   AROM / PROM / Strength  AROM      AROM   Overall AROM   Deficits    AROM Assessment Site  Knee    Right/Left Knee  Right;Left    Right Knee Flexion  102    Left Knee Flexion  82                  [facility] Adult PT Treatment/Exercise - 07/27/17 0001      Knee/Hip Exercises: Aerobic   Nustep  Level 5 x 15 minutes.       Knee/Hip Exercises: Machines for Strengthening   Cybex Knee Extension  10# x20 reps    Cybex Knee Flexion  20# x20 reps      Knee/Hip Exercises: Standing   Hip Abduction  AROM;Both;20 reps;Knee straight      Knee/Hip Exercises: Seated   Sit to Sand  20 reps;without UE support      Knee/Hip Exercises: Supine   Straight Leg Raises  AROM;Both;15 reps      Modalities   Modalities  Vasopneumatic      Vasopneumatic   Number Minutes Vasopneumatic   15 minutes    Vasopnuematic Location   Knee    Vasopneumatic Pressure  Medium    Vasopneumatic Temperature   34               PT Short Term Goals - 06/26/17 1442      PT SHORT TERM GOAL #1   Title  STG=LTG        PT Long Term Goals - 07/27/17 7353      PT LONG TERM GOAL #1   Title  Patient will be independent with HEP    Time  6    Period  Weeks    Status  Achieved      PT LONG TERM GOAL #2   Title  Patient will improve Bilateral knee flexion strength to 5/5 to perform functional activites    Baseline  4+/5    Time  6    Period  Weeks    Status  On-going      PT LONG TERM GOAL #3   Title  Patient will report less than or equal to 2/10 pain in right knee when transitioning from sit to stand.    Time  6    Period  Weeks    Status  On-going      PT LONG TERM GOAL #4   Title  Patient will improve bilateral knee flexion AROM to 115 degrees to allow adeuate movement for ADLs and functional activities.    Baseline  93    Time  6    Period  Weeks    Status  On-going AROM L knee 82 deg, R knee 102 deg 07/27/2017            Plan - 07/27/17 0943    Clinical Impression Statement  Patient tolerated today's treatment well today with no complaints of pain upon arrival. Patient guided through low resistance knee strengthening on machinary with no complaints although fatigued with knee extension. R hip ER noted with B standing hip abduction even following VCs and demo for correction. Patient very fatigued with B SLRs  although she fatigued much faster with RLE SLR. Minimal improvements noted with B knee flexion ROM although R knee improvements > L knee. Normal vasopneumatic response noted following removal of the modality.    PT Frequency  3x / week    PT Duration  6 weeks    PT Treatment/Interventions  ADLs/Self Care Home Management;Electrical Stimulation;Iontophoresis 4mg /ml Dexamethasone;Ultrasound;Therapeutic exercise;Therapeutic activities;Functional mobility training;Gait training;Stair training;Balance training;Neuromuscular re-education;Patient/family education;Manual techniques;Passive range of motion;Vasopneumatic Device    PT Next Visit Plan  cont with POC for knee strengthening and ROM, Modalities PRN.    PT Home Exercise Plan  LAQ, heel slides, seated marching (red theraband added for LAQ, HS curl); gastroc stretch    Consulted and Agree with Plan of Care  Patient       Patient will benefit from skilled therapeutic intervention in order to improve the following deficits and impairments:  Abnormal gait, Pain, Decreased range of motion, Decreased strength  Visit Diagnosis: Stiffness of right knee, not elsewhere classified  Chronic pain of right knee  Chronic pain of left knee  Stiffness of left knee, not elsewhere classified     Problem List Patient Active Problem List   Diagnosis Date Noted  . Acute pain of right knee 05/30/2017  . Essential hypertension 02/19/2017  . Gastroesophageal reflux disease without esophagitis 06/11/2016  . Personal history of ovarian cyst 06/11/2016  . Pain in joint involving left pelvic region and thigh 06/11/2016  . Age-related osteoporosis without current pathological fracture 06/11/2016  . Screening for breast cancer 06/11/2016  . Bilateral carpal tunnel syndrome 06/09/2016  . Generalized OA 06/09/2016  . Peripheral edema 01/03/2016  . Morbid obesity with BMI of 40.0-44.9, adult ([place]) 01/03/2016  . Right shoulder pain 07/06/2015    Standley Brooking,  PTA 07/27/2017, 9:59 AM  [facility] [place], [place], [address] Phone: [phone]   Fax:  [phone]  Name: Melissa Zavala MRN: [id number] Date of Birth: 1941-01-29

## 2017-07-30 ENCOUNTER — Ambulatory Visit: Payer: Medicare HMO | Admitting: Physical Therapy

## 2017-07-30 DIAGNOSIS — M25662 Stiffness of left knee, not elsewhere classified: Secondary | ICD-10-CM | POA: Diagnosis not present

## 2017-07-30 DIAGNOSIS — M25661 Stiffness of right knee, not elsewhere classified: Secondary | ICD-10-CM | POA: Diagnosis not present

## 2017-07-30 DIAGNOSIS — G8929 Other chronic pain: Secondary | ICD-10-CM | POA: Diagnosis not present

## 2017-07-30 DIAGNOSIS — M25562 Pain in left knee: Secondary | ICD-10-CM | POA: Diagnosis not present

## 2017-07-30 DIAGNOSIS — M25561 Pain in right knee: Secondary | ICD-10-CM | POA: Diagnosis not present

## 2017-07-30 NOTE — Therapy (Signed)
[facility] [place], [place], [address] Phone: [phone]   Fax:  [phone]  Physical Therapy Treatment  Patient Details  Name: Melissa Zavala MRN: [id number] Date of Birth: February 03, 1941 Referring Provider: Hervey Ard, PA-C   Encounter Date: 07/30/2017  PT End of Session - 07/30/17 0950    Visit Number  11    Number of Visits  18    Date for PT Re-Evaluation  08/07/17    PT Start Time  0949    PT Stop Time  1040    PT Time Calculation (min)  51 min    Activity Tolerance  Patient tolerated treatment well    Behavior During Therapy  [facility] for tasks assessed/performed       Past Medical History:  Diagnosis Date  . Arthritis   . Hypertension     Past Surgical History:  Procedure Laterality Date  . TOTAL KNEE ARTHROPLASTY    . TOTAL KNEE ARTHROPLASTY Bilateral   . TUBAL LIGATION      There were no vitals filed for this visit.  Subjective Assessment - 07/30/17 0952    Subjective  Patient reported her knees did not hurt over the weekend.    Limitations  Sitting;Walking    How long can you sit comfortably?  Less than 1 hr    Patient Stated Goals  "get better"    Currently in Pain?  No/denies         [facility] PT Assessment - 07/30/17 0001      Assessment   Medical Diagnosis  Chronic pain in R knee, R knee osteoarthritis,s/p bilateral knee replacement    Next MD Visit  N/A    Prior Therapy  yes      Precautions   Precautions  None      Restrictions   Weight Bearing Restrictions  No                  [facility] Adult PT Treatment/Exercise - 07/30/17 0001      Knee/Hip Exercises: Aerobic   Nustep  Level 5 x 16 minutes.      Knee/Hip Exercises: Standing   Knee Flexion  AROM;Strengthening;2 sets;10 reps    Hip Flexion  2 sets;10 reps;Knee bent;AROM    Forward Step Up  Right;2 sets;10 reps;Hand Hold: 2;Step Height: 6"    Rocker Board  3 minutes    Other Standing Knee Exercises  lateral stepping at  sink x2 minutes      Vasopneumatic   Number Minutes Vasopneumatic   15 minutes    Vasopnuematic Location   Knee    Vasopneumatic Pressure  Medium    Vasopneumatic Temperature   34               PT Short Term Goals - 06/26/17 1442      PT SHORT TERM GOAL #1   Title  STG=LTG        PT Long Term Goals - 07/27/17 6222      PT LONG TERM GOAL #1   Title  Patient will be independent with HEP    Time  6    Period  Weeks    Status  Achieved      PT LONG TERM GOAL #2   Title  Patient will improve Bilateral knee flexion strength to 5/5 to perform functional activites    Baseline  4+/5    Time  6    Period  Weeks    Status  On-going      PT LONG TERM GOAL #3   Title  Patient will report less than or equal to 2/10 pain in right knee when transitioning from sit to stand.    Time  6    Period  Weeks    Status  On-going      PT LONG TERM GOAL #4   Title  Patient will improve bilateral knee flexion AROM to 115 degrees to allow adeuate movement for ADLs and functional activities.    Baseline  93    Time  6    Period  Weeks    Status  On-going AROM L knee 82 deg, R knee 102 deg 07/27/2017            Plan - 07/30/17 1027    Clinical Impression Statement  Patient was able to complete exercises with minimal muscle fatigue particularly with standing marching. Patient stated she noticed she has made improvements overall with therapy. Normal response to modalities upon removal.    Clinical Presentation  Stable    Clinical Decision Making  Low    Rehab Potential  Good    PT Frequency  3x / week    PT Duration  6 weeks    PT Treatment/Interventions  ADLs/Self Care Home Management;Electrical Stimulation;Iontophoresis 4mg /ml Dexamethasone;Ultrasound;Therapeutic exercise;Therapeutic activities;Functional mobility training;Gait training;Stair training;Balance training;Neuromuscular re-education;Patient/family education;Manual techniques;Passive range of motion;Vasopneumatic Device     PT Next Visit Plan  cont with POC for knee strengthening and ROM, Modalities PRN.    Consulted and Agree with Plan of Care  Patient       Patient will benefit from skilled therapeutic intervention in order to improve the following deficits and impairments:  Abnormal gait, Pain, Decreased range of motion, Decreased strength  Visit Diagnosis: Stiffness of right knee, not elsewhere classified  Chronic pain of right knee  Chronic pain of left knee  Stiffness of left knee, not elsewhere classified     Problem List Patient Active Problem List   Diagnosis Date Noted  . Acute pain of right knee 05/30/2017  . Essential hypertension 02/19/2017  . Gastroesophageal reflux disease without esophagitis 06/11/2016  . Personal history of ovarian cyst 06/11/2016  . Pain in joint involving left pelvic region and thigh 06/11/2016  . Age-related osteoporosis without current pathological fracture 06/11/2016  . Screening for breast cancer 06/11/2016  . Bilateral carpal tunnel syndrome 06/09/2016  . Generalized OA 06/09/2016  . Peripheral edema 01/03/2016  . Morbid obesity with BMI of 40.0-44.9, adult ([place]) 01/03/2016  . Right shoulder pain 07/06/2015   Gabriela Eves, PT, DPT 07/30/2017, 10:49 AM  [facility] [address] [place], [place], [address] Phone: [phone]   Fax:  [phone]  Name: TORRYN FISKE MRN: [id number] Date of Birth: 03/25/1941

## 2017-08-02 ENCOUNTER — Ambulatory Visit: Payer: Medicare HMO | Admitting: *Deleted

## 2017-08-02 DIAGNOSIS — G8929 Other chronic pain: Secondary | ICD-10-CM | POA: Diagnosis not present

## 2017-08-02 DIAGNOSIS — M25561 Pain in right knee: Secondary | ICD-10-CM | POA: Diagnosis not present

## 2017-08-02 DIAGNOSIS — M25562 Pain in left knee: Secondary | ICD-10-CM | POA: Diagnosis not present

## 2017-08-02 DIAGNOSIS — M25662 Stiffness of left knee, not elsewhere classified: Secondary | ICD-10-CM | POA: Diagnosis not present

## 2017-08-02 DIAGNOSIS — M25661 Stiffness of right knee, not elsewhere classified: Secondary | ICD-10-CM

## 2017-08-02 NOTE — Therapy (Signed)
[facility] [place], [place], [address] Phone: [phone]   Fax:  [phone]  Physical Therapy Treatment  Patient Details  Name: Melissa Zavala MRN: [id number] Date of Birth: 17-Mar-1941 Referring Provider: Hervey Ard, PA-C   Encounter Date: 08/02/2017  PT End of Session - 08/02/17 1102    Visit Number  12    Number of Visits  18    Date for PT Re-Evaluation  08/07/17    PT Start Time  1030    PT Stop Time  1120    PT Time Calculation (min)  50 min       Past Medical History:  Diagnosis Date  . Arthritis   . Hypertension     Past Surgical History:  Procedure Laterality Date  . TOTAL KNEE ARTHROPLASTY    . TOTAL KNEE ARTHROPLASTY Bilateral   . TUBAL LIGATION      There were no vitals filed for this visit.                   [facility] Adult PT Treatment/Exercise - 08/02/17 0001      Knee/Hip Exercises: Aerobic   Nustep  Level 5 x 16 minutes.      Knee/Hip Exercises: Standing   Knee Flexion  AROM;Strengthening;10 reps;3 sets    Hip Flexion  10 reps;Knee bent;AROM;3 sets;Both    Hip Abduction  AROM;Both;20 reps;Knee straight    Forward Step Up  Right;2 sets;10 reps;Hand Hold: 2;Step Height: 6"    Rocker Board  3 minutes      Vasopneumatic   Number Minutes Vasopneumatic   15 minutes    Vasopnuematic Location   Knee    Vasopneumatic Pressure  Medium    Vasopneumatic Temperature   34      Manual PROM to both knees for fleion         PT Short Term Goals - 06/26/17 1442      PT SHORT TERM GOAL #1   Title  STG=LTG        PT Long Term Goals - 07/27/17 2353      PT LONG TERM GOAL #1   Title  Patient will be independent with HEP    Time  6    Period  Weeks    Status  Achieved      PT LONG TERM GOAL #2   Title  Patient will improve Bilateral knee flexion strength to 5/5 to perform functional activites    Baseline  4+/5    Time  6    Period  Weeks    Status  On-going      PT LONG TERM GOAL #3   Title  Patient will report less than or equal to 2/10 pain in right knee when transitioning from sit to stand.    Time  6    Period  Weeks    Status  On-going      PT LONG TERM GOAL #4   Title  Patient will improve bilateral knee flexion AROM to 115 degrees to allow adeuate movement for ADLs and functional activities.    Baseline  93    Time  6    Period  Weeks    Status  On-going AROM L knee 82 deg, R knee 102 deg 07/27/2017            Plan - 08/02/17 1103    Clinical Impression Statement  Pt. arrived today doing fairly well. She was able to complete all  ROM and strengthening for Bil LE's. She continues to improve and ROM was 110 degrees RT knee and only 90 degrees in LT. Encouraged Flexion stretchin 4-6 times daily. Normal modality response     Clinical Presentation  Stable    Rehab Potential  Good    PT Frequency  3x / week    PT Duration  6 weeks    PT Treatment/Interventions  ADLs/Self Care Home Management;Electrical Stimulation;Iontophoresis 4mg /ml Dexamethasone;Ultrasound;Therapeutic exercise;Therapeutic activities;Functional mobility training;Gait training;Stair training;Balance training;Neuromuscular re-education;Patient/family education;Manual techniques;Passive range of motion;Vasopneumatic Device    PT Next Visit Plan  cont with POC for knee strengthening and ROM, Modalities PRN.    PT Home Exercise Plan  LAQ, heel slides, seated marching (red theraband added for LAQ, HS curl); gastroc stretch    Consulted and Agree with Plan of Care  Patient       Patient will benefit from skilled therapeutic intervention in order to improve the following deficits and impairments:  Abnormal gait, Pain, Decreased range of motion, Decreased strength  Visit Diagnosis: Stiffness of right knee, not elsewhere classified  Chronic pain of right knee  Chronic pain of left knee  Stiffness of left knee, not elsewhere classified     Problem List Patient Active  Problem List   Diagnosis Date Noted  . Acute pain of right knee 05/30/2017  . Essential hypertension 02/19/2017  . Gastroesophageal reflux disease without esophagitis 06/11/2016  . Personal history of ovarian cyst 06/11/2016  . Pain in joint involving left pelvic region and thigh 06/11/2016  . Age-related osteoporosis without current pathological fracture 06/11/2016  . Screening for breast cancer 06/11/2016  . Bilateral carpal tunnel syndrome 06/09/2016  . Generalized OA 06/09/2016  . Peripheral edema 01/03/2016  . Morbid obesity with BMI of 40.0-44.9, adult ([place]) 01/03/2016  . Right shoulder pain 07/06/2015    RAMSEUR,CHRIS, PTA 08/02/2017, 11:44 AM  [facility] [place], [place], [address] Phone: [phone]   Fax:  [phone]  Name: Melissa Zavala MRN: [id number] Date of Birth: November 17, 1940

## 2017-08-07 ENCOUNTER — Ambulatory Visit: Payer: Medicare HMO | Admitting: *Deleted

## 2017-08-07 DIAGNOSIS — M25662 Stiffness of left knee, not elsewhere classified: Secondary | ICD-10-CM

## 2017-08-07 DIAGNOSIS — M25561 Pain in right knee: Secondary | ICD-10-CM

## 2017-08-07 DIAGNOSIS — M25661 Stiffness of right knee, not elsewhere classified: Secondary | ICD-10-CM | POA: Diagnosis not present

## 2017-08-07 DIAGNOSIS — M25562 Pain in left knee: Secondary | ICD-10-CM

## 2017-08-07 DIAGNOSIS — G8929 Other chronic pain: Secondary | ICD-10-CM

## 2017-08-07 NOTE — Therapy (Addendum)
[facility] [place], [place], [address] Phone: [phone]   Fax:  [phone]  Physical Therapy Treatment/Discharge  Patient Details  Name: Melissa Zavala MRN: [id number] Date of Birth: 1940/08/10 Referring Provider: Hervey Ard, PA-C   Encounter Date: 08/07/2017  PT End of Session - 08/07/17 1009    Visit Number  13    Number of Visits  18    Date for PT Re-Evaluation  08/07/17    PT Start Time  0945    PT Stop Time  2010    PT Time Calculation (min)  50 min       Past Medical History:  Diagnosis Date  . Arthritis   . Hypertension     Past Surgical History:  Procedure Laterality Date  . TOTAL KNEE ARTHROPLASTY    . TOTAL KNEE ARTHROPLASTY Bilateral   . TUBAL LIGATION      There were no vitals filed for this visit.  Subjective Assessment - 08/07/17 1004    Subjective  Patient reported her knees did not hurt over the weekend. RT knee has not locked in a while     Limitations  Sitting;Walking    How long can you sit comfortably?  Less than 1 hr    Patient Stated Goals  "get better"    Currently in Pain?  No/denies    Pain Location  Knee    Pain Orientation  Right                No data recorded       [facility] Adult PT Treatment/Exercise - 08/07/17 0001      Knee/Hip Exercises: Aerobic   Nustep  Level 5 x 16 minutes.      Knee/Hip Exercises: Standing   Knee Flexion  AROM;Strengthening;10 reps;3 sets    Hip Flexion  10 reps;Knee bent;AROM;3 sets;Both    Hip Abduction  AROM;Both;20 reps;Knee straight    Forward Step Up  Right;2 sets;10 reps;Hand Hold: 2;Step Height: 6"    Rocker Board  3 minutes      Vasopneumatic   Number Minutes Vasopneumatic   15 minutes    Vasopnuematic Location   Knee    Vasopneumatic Pressure  Medium    Vasopneumatic Temperature   34               PT Short Term Goals - 06/26/17 1442      PT SHORT TERM GOAL #1   Title  STG=LTG        PT Long  Term Goals - 07/27/17 0712      PT LONG TERM GOAL #1   Title  Patient will be independent with HEP    Time  6    Period  Weeks    Status  Achieved      PT LONG TERM GOAL #2   Title  Patient will improve Bilateral knee flexion strength to 5/5 to perform functional activites    Baseline  4+/5    Time  6    Period  Weeks    Status  On-going      PT LONG TERM GOAL #3   Title  Patient will report less than or equal to 2/10 pain in right knee when transitioning from sit to stand.    Time  6    Period  Weeks    Status  On-going      PT LONG TERM GOAL #4   Title  Patient will improve bilateral knee  flexion AROM to 115 degrees to allow adeuate movement for ADLs and functional activities.    Baseline  93    Time  6    Period  Weeks    Status  On-going AROM L knee 82 deg, R knee 102 deg 07/27/2017            Plan - 08/07/17 1102    Clinical Impression Statement  Pt arrrrived today with minimal Bil knee pain and was able to perform all therex for strengthening with mainly fatigue. Her complaints were more of Bil hip pain. Normal modality response.    Rehab Potential  Good    PT Frequency  3x / week    PT Duration  6 weeks    PT Treatment/Interventions  ADLs/Self Care Home Management;Electrical Stimulation;Iontophoresis 76m/ml Dexamethasone;Ultrasound;Therapeutic exercise;Therapeutic activities;Functional mobility training;Gait training;Stair training;Balance training;Neuromuscular re-education;Patient/family education;Manual techniques;Passive range of motion;Vasopneumatic Device    PT Next Visit Plan  cont with POC for knee strengthening and ROM, Modalities PRN.    PT Home Exercise Plan  LAQ, heel slides, seated marching (red theraband added for LAQ, HS curl); gastroc stretch    Consulted and Agree with Plan of Care  Patient       Patient will benefit from skilled therapeutic intervention in order to improve the following deficits and impairments:  Abnormal gait, Pain, Decreased  range of motion, Decreased strength  Visit Diagnosis: Stiffness of right knee, not elsewhere classified  Chronic pain of right knee  Chronic pain of left knee  Stiffness of left knee, not elsewhere classified     Problem List Patient Active Problem List   Diagnosis Date Noted  . Acute pain of right knee 05/30/2017  . Essential hypertension 02/19/2017  . Gastroesophageal reflux disease without esophagitis 06/11/2016  . Personal history of ovarian cyst 06/11/2016  . Pain in joint involving left pelvic region and thigh 06/11/2016  . Age-related osteoporosis without current pathological fracture 06/11/2016  . Screening for breast cancer 06/11/2016  . Bilateral carpal tunnel syndrome 06/09/2016  . Generalized OA 06/09/2016  . Peripheral edema 01/03/2016  . Morbid obesity [place] BMI [place] 40.0-44.9[place] 01/03/2016  . Right shoulder pain 07/06/2015   PHYSICAL THERAPY DISCHARGE SUMMARY  Visits from Start of Care: 13  Current functional level related to goals / functional outcomes: See above   Remaining deficits: Goals ongoing at discharge   Education / Equipment: hep Plan: Patient agrees to discharge.  Patient goals were not met. Patient is being discharged due to not returning since the last visit.  ?????        Kharis Lapenna,CHRIS, PTA 08/07/2017, 11:23 AM  [facility]MSail [place] [address]Phone: [phone]  Fax:  [phone] Name: Melissa CHAHALMRN: [id number] of Birth: 603/12/1940

## 2017-08-09 ENCOUNTER — Encounter: Payer: Medicare HMO | Admitting: Physical Therapy

## 2017-08-13 ENCOUNTER — Encounter: Payer: Self-pay | Admitting: Physician Assistant

## 2017-08-13 ENCOUNTER — Other Ambulatory Visit: Payer: Self-pay | Admitting: Physician Assistant

## 2017-08-13 ENCOUNTER — Ambulatory Visit (INDEPENDENT_AMBULATORY_CARE_PROVIDER_SITE_OTHER): Payer: Medicare HMO | Admitting: Physician Assistant

## 2017-08-13 VITALS — BP 131/73 | HR 83 | Temp 96.5°F | Ht 63.0 in | Wt 211.9 lb

## 2017-08-13 DIAGNOSIS — M25461 Effusion, right knee: Secondary | ICD-10-CM | POA: Diagnosis not present

## 2017-08-13 DIAGNOSIS — R42 Dizziness and giddiness: Secondary | ICD-10-CM | POA: Diagnosis not present

## 2017-08-13 DIAGNOSIS — R300 Dysuria: Secondary | ICD-10-CM | POA: Diagnosis not present

## 2017-08-13 DIAGNOSIS — M25462 Effusion, left knee: Secondary | ICD-10-CM

## 2017-08-13 DIAGNOSIS — R609 Edema, unspecified: Secondary | ICD-10-CM

## 2017-08-13 DIAGNOSIS — R7989 Other specified abnormal findings of blood chemistry: Secondary | ICD-10-CM

## 2017-08-13 LAB — URINALYSIS, COMPLETE
Bilirubin, UA: NEGATIVE
Glucose, UA: NEGATIVE
Ketones, UA: NEGATIVE
LEUKOCYTES UA: NEGATIVE
Nitrite, UA: NEGATIVE
PH UA: 6.5 (ref 5.0–7.5)
PROTEIN UA: NEGATIVE
RBC, UA: NEGATIVE
Specific Gravity, UA: 1.01 (ref 1.005–1.030)
Urobilinogen, Ur: 0.2 mg/dL (ref 0.2–1.0)

## 2017-08-13 MED ORDER — MECLIZINE HCL 25 MG PO TABS: 25.0000 mg | ORAL_TABLET | Freq: Three times a day (TID) | ORAL | 0 refills | Status: DC | PRN

## 2017-08-14 ENCOUNTER — Encounter: Payer: Medicare HMO | Admitting: Physical Therapy

## 2017-08-14 DIAGNOSIS — R42 Dizziness and giddiness: Secondary | ICD-10-CM | POA: Insufficient documentation

## 2017-08-14 DIAGNOSIS — M25461 Effusion, right knee: Secondary | ICD-10-CM | POA: Insufficient documentation

## 2017-08-14 DIAGNOSIS — M25462 Effusion, left knee: Secondary | ICD-10-CM

## 2017-08-14 LAB — CMP14+EGFR
ALBUMIN: 4.4 g/dL (ref 3.5–4.8)
ALT: 10 IU/L (ref 0–32)
AST: 10 IU/L (ref 0–40)
Albumin/Globulin Ratio: 1.8 (ref 1.2–2.2)
Alkaline Phosphatase: 70 IU/L (ref 39–117)
BUN / CREAT RATIO: 23 (ref 12–28)
BUN: 19 mg/dL (ref 8–27)
Bilirubin Total: <0.2 mg/dL (ref 0.0–1.2)
CALCIUM: 10 mg/dL (ref 8.7–10.3)
CO2: 25 mmol/L (ref 20–29)
CREATININE: 0.83 mg/dL (ref 0.57–1.00)
Chloride: 101 mmol/L (ref 96–106)
GFR calc Af Amer: 79 mL/min/{1.73_m2} (ref >59)
GFR, EST NON AFRICAN AMERICAN: 69 mL/min/{1.73_m2} (ref >59)
GLOBULIN, TOTAL: 2.5 g/dL (ref 1.5–4.5)
Glucose: 94 mg/dL (ref 65–99)
Potassium: 4.2 mmol/L (ref 3.5–5.2)
SODIUM: 142 mmol/L (ref 134–144)
Total Protein: 6.9 g/dL (ref 6.0–8.5)

## 2017-08-14 LAB — CBC WITH DIFFERENTIAL/PLATELET
Basophils Absolute: 0 10*3/uL (ref 0.0–0.2)
Basos: 0 %
EOS (ABSOLUTE): 0.3 10*3/uL (ref 0.0–0.4)
Eos: 3 %
HEMATOCRIT: 36.9 % (ref 34.0–46.6)
HEMOGLOBIN: 11.9 g/dL (ref 11.1–15.9)
IMMATURE GRANS (ABS): 0 10*3/uL (ref 0.0–0.1)
IMMATURE GRANULOCYTES: 0 %
LYMPHS: 35 %
Lymphocytes Absolute: 2.9 10*3/uL (ref 0.7–3.1)
MCH: 26.9 pg (ref 26.6–33.0)
MCHC: 32.2 g/dL (ref 31.5–35.7)
MCV: 83 fL (ref 79–97)
MONOCYTES: 6 %
Monocytes Absolute: 0.5 10*3/uL (ref 0.1–0.9)
NEUTROS PCT: 56 %
Neutrophils Absolute: 4.7 10*3/uL (ref 1.4–7.0)
Platelets: 269 10*3/uL (ref 150–379)
RBC: 4.43 x10E6/uL (ref 3.77–5.28)
RDW: 14.6 % (ref 12.3–15.4)
WBC: 8.4 10*3/uL (ref 3.4–10.8)

## 2017-08-14 LAB — TSH: TSH: 0.455 u[IU]/mL (ref 0.450–4.500)

## 2017-08-14 NOTE — Progress Notes (Signed)
BP 131/73   Pulse 83   Temp (!) 96.5 F (35.8 C) (Oral)   Ht 5' 3"  (1.6 m)   Wt 211 lb 14.4 oz (96.1 kg)   BMI 37.54 kg/m    Subjective:    Patient ID: Melissa Zavala, female    DOB: 10/21/1940, 77 y.o.   MRN: [id number]  HPI: Melissa Zavala is a 77 y.o. female presenting on 08/13/2017 for Dizziness; Edema; and Dysuria  This patient comes in for periodic recheck on medications and conditions including osteoarthritis.  She continues with significant right knee pain.  She has had a replacement.  She is undergoing therapy.  She is concerned about infection in the joint.  This happened in her left knee.  She also needs lab work to recheck her thyroid, standard CBC and comprehensive.  Just looking for any signs of infection or inflammation.  We will also check a urine.  To see if there is any infection there.  She is also had about 4 days of dizziness upon standing.  She had vertigo many years ago.  She denies any nausea or vomiting.  She just takes her time getting up.  All medications are reviewed today. There are no reports of any problems with the medications. All of the medical conditions are reviewed and updated.  Lab work is reviewed and will be ordered as medically necessary. There are no new problems reported with today's visit.   Past Medical History:  Diagnosis Date  . Arthritis   . Hypertension    Relevant past medical, surgical, family and social history reviewed and updated as indicated. Interim medical history since our last visit reviewed. Allergies and medications reviewed and updated. DATA REVIEWED: CHART IN EPIC  Family History reviewed for pertinent findings.  Review of Systems  Constitutional: Negative.  Negative for activity change, fatigue and fever.  HENT: Negative.   Eyes: Negative.   Respiratory: Negative.  Negative for cough.   Cardiovascular: Negative.  Negative for chest pain.  Gastrointestinal: Negative.  Negative for abdominal pain.  Endocrine:  Negative.   Genitourinary: Negative.  Negative for dysuria.  Musculoskeletal: Positive for arthralgias, back pain and myalgias.  Skin: Negative.   Neurological: Positive for dizziness and weakness. Negative for seizures, facial asymmetry and numbness.    Allergies as of 08/13/2017      Reactions   Iodinated Diagnostic Agents Anaphylaxis   Codeine Other (See Comments)   Hallucinations   Diphenhydramine Other (See Comments)   Patient states "feels like somebody is inside of me clawing me out"      Medication List        Accurate as of 08/13/17 11:59 PM. Always use your most recent med list.          amLODipine 10 MG tablet Commonly known as:  NORVASC Take 1 tablet (10 mg total) by mouth daily.   celecoxib 200 MG capsule Commonly known as:  CELEBREX TAKE (1) CAPSULE DAILY.   esomeprazole 40 MG capsule Commonly known as:  NEXIUM TAKE (1) CAPSULE DAILY.   furosemide 20 MG tablet Commonly known as:  LASIX TAKE ONE TABLET DAILY. WHEN LOTS OF SWELLING.   hydrochlorothiazide 25 MG tablet Commonly known as:  HYDRODIURIL Take 1 tablet (25 mg total) by mouth daily.   lisinopril 30 MG tablet Commonly known as:  PRINIVIL,ZESTRIL Take 1 tablet (30 mg total) by mouth daily.   meclizine 25 MG tablet Commonly known as:  ANTIVERT Take 1 tablet (25 mg total) by  mouth 3 (three) times daily as needed for dizziness.   mupirocin ointment 2 % Commonly known as:  BACTROBAN Place 1 application into the nose 3 (three) times daily.   potassium chloride 10 MEQ tablet Commonly known as:  K-DUR Take 1 tablet (10 mEq total) by mouth daily.   traMADol 50 MG tablet Commonly known as:  ULTRAM Take 2 tablets (100 mg total) by mouth daily.          Objective:    BP 131/73   Pulse 83   Temp (!) 96.5 F (35.8 C) (Oral)   Ht 5' 3"  (1.6 m)   Wt 211 lb 14.4 oz (96.1 kg)   BMI 37.54 kg/m   Allergies  Allergen Reactions  . Iodinated Diagnostic Agents Anaphylaxis  . Codeine Other (See  Comments)    Hallucinations  . Diphenhydramine Other (See Comments)    Patient states "feels like somebody is inside of me clawing me out"    Wt Readings from Last 3 Encounters:  08/13/17 211 lb 14.4 oz (96.1 kg)  06/29/17 207 lb (93.9 kg)  05/30/17 211 lb (95.7 kg)    Physical Exam  Constitutional: She is oriented to person, place, and time. She appears well-developed and well-nourished.  HENT:  Head: Normocephalic and atraumatic.  Eyes: Pupils are equal, round, and reactive to light. Conjunctivae and EOM are normal.  Cardiovascular: Normal rate, regular rhythm, normal heart sounds and intact distal pulses.  Pulmonary/Chest: Effort normal and breath sounds normal.  Abdominal: Soft. Bowel sounds are normal.  Musculoskeletal:       Right knee: She exhibits decreased range of motion and deformity. Tenderness found.  Neurological: She is alert and oriented to person, place, and time. She has normal strength and normal reflexes. No cranial nerve deficit or sensory deficit. Coordination abnormal. Gait normal.  Skin: Skin is warm and dry. No rash noted.  Psychiatric: She has a normal mood and affect. Her behavior is normal. Judgment and thought content normal.    Results for orders placed or performed in visit on 08/13/17  CBC with Differential/Platelet  Result Value Ref Range   WBC 8.4 3.4 - 10.8 x10E3/uL   RBC 4.43 3.77 - 5.28 x10E6/uL   Hemoglobin 11.9 11.1 - 15.9 g/dL   Hematocrit 36.9 34.0 - 46.6 %   MCV 83 79 - 97 fL   MCH 26.9 26.6 - 33.0 pg   MCHC 32.2 31.5 - 35.7 g/dL   RDW 14.6 12.3 - 15.4 %   Platelets 269 150 - 379 x10E3/uL   Neutrophils 56 Not Estab. %   Lymphs 35 Not Estab. %   Monocytes 6 Not Estab. %   Eos 3 Not Estab. %   Basos 0 Not Estab. %   Neutrophils Absolute 4.7 1.4 - 7.0 x10E3/uL   Lymphocytes Absolute 2.9 0.7 - 3.1 x10E3/uL   Monocytes Absolute 0.5 0.1 - 0.9 x10E3/uL   EOS (ABSOLUTE) 0.3 0.0 - 0.4 x10E3/uL   Basophils Absolute 0.0 0.0 - 0.2 x10E3/uL     Immature Granulocytes 0 Not Estab. %   Immature Grans (Abs) 0.0 0.0 - 0.1 x10E3/uL  CMP14+EGFR  Result Value Ref Range   Glucose 94 65 - 99 mg/dL   BUN 19 8 - 27 mg/dL   Creatinine, Ser 0.83 0.57 - 1.00 mg/dL   GFR calc non Af Amer 69 >59 mL/min/1.73   GFR calc Af Amer 79 >59 mL/min/1.73   BUN/Creatinine Ratio 23 12 - 28   Sodium 142 134 -  144 mmol/L   Potassium 4.2 3.5 - 5.2 mmol/L   Chloride 101 96 - 106 mmol/L   CO2 25 20 - 29 mmol/L   Calcium 10.0 8.7 - 10.3 mg/dL   Total Protein 6.9 6.0 - 8.5 g/dL   Albumin 4.4 3.5 - 4.8 g/dL   Globulin, Total 2.5 1.5 - 4.5 g/dL   Albumin/Globulin Ratio 1.8 1.2 - 2.2   Bilirubin Total <0.2 0.0 - 1.2 mg/dL   Alkaline Phosphatase 70 39 - 117 IU/L   AST 10 0 - 40 IU/L   ALT 10 0 - 32 IU/L  TSH  Result Value Ref Range   TSH 0.455 0.450 - 4.500 uIU/mL  Urinalysis, Complete  Result Value Ref Range   Specific Gravity, UA 1.010 1.005 - 1.030   pH, UA 6.5 5.0 - 7.5   Color, UA Yellow Yellow   Appearance Ur Clear Clear   Leukocytes, UA Negative Negative   Protein, UA Negative Negative/Trace   Glucose, UA Negative Negative   Ketones, UA Negative Negative   RBC, UA Negative Negative   Bilirubin, UA Negative Negative   Urobilinogen, Ur 0.2 0.2 - 1.0 mg/dL   Nitrite, UA Negative Negative      Assessment & Plan:   1. Vertigo - meclizine (ANTIVERT) 25 MG tablet; Take 1 tablet (25 mg total) by mouth 3 (three) times daily as needed for dizziness.  Dispense: 20 tablet; Refill: 0  2. Bilateral knee swelling - CBC with Differential/Platelet - CMP14+EGFR - TSH  3. Abnormal TSH - CBC with Differential/Platelet - CMP14+EGFR - TSH  4. Peripheral edema - CBC with Differential/Platelet - CMP14+EGFR - TSH  5. Dysuria - Urinalysis, Complete - Urine Culture   Continue all other maintenance medications as listed above.  Follow up plan: No follow-ups on file.  Educational handout given for [place] PA-C Renick [address]  [place], [place] [address] [phone]   08/14/2017, 8:49 AM

## 2017-08-15 LAB — URINE CULTURE

## 2017-08-16 ENCOUNTER — Encounter: Payer: Medicare HMO | Admitting: *Deleted

## 2017-09-12 ENCOUNTER — Other Ambulatory Visit: Payer: Self-pay | Admitting: Physician Assistant

## 2017-09-12 DIAGNOSIS — M159 Polyosteoarthritis, unspecified: Secondary | ICD-10-CM

## 2017-09-12 DIAGNOSIS — M150 Primary generalized (osteo)arthritis: Principal | ICD-10-CM

## 2017-09-20 DIAGNOSIS — T8484XD Pain due to internal orthopedic prosthetic devices, implants and grafts, subsequent encounter: Secondary | ICD-10-CM | POA: Diagnosis not present

## 2017-09-20 DIAGNOSIS — Z96653 Presence of artificial knee joint, bilateral: Secondary | ICD-10-CM | POA: Diagnosis not present

## 2017-09-21 ENCOUNTER — Telehealth: Payer: Self-pay | Admitting: Physician Assistant

## 2017-09-21 ENCOUNTER — Encounter: Payer: Self-pay | Admitting: Physician Assistant

## 2017-09-21 NOTE — Telephone Encounter (Signed)
Patient aware.

## 2017-09-21 NOTE — Telephone Encounter (Signed)
Letter completed.

## 2017-09-21 NOTE — Telephone Encounter (Signed)
Patient needs a letter stating that she is disabled and unable to work. Patient states that she needs this today ASAP if possible. Patient states she has been out on disability since 2008 due to arthritis in her knee's and legs

## 2017-09-24 ENCOUNTER — Ambulatory Visit: Payer: Medicare HMO | Admitting: Physician Assistant

## 2017-10-13 ENCOUNTER — Other Ambulatory Visit: Payer: Self-pay | Admitting: Physician Assistant

## 2017-10-13 DIAGNOSIS — M159 Polyosteoarthritis, unspecified: Secondary | ICD-10-CM

## 2017-10-13 DIAGNOSIS — M150 Primary generalized (osteo)arthritis: Principal | ICD-10-CM

## 2017-10-25 DIAGNOSIS — Z96651 Presence of right artificial knee joint: Secondary | ICD-10-CM | POA: Diagnosis not present

## 2017-10-25 DIAGNOSIS — T8484XD Pain due to internal orthopedic prosthetic devices, implants and grafts, subsequent encounter: Secondary | ICD-10-CM | POA: Diagnosis not present

## 2017-11-08 DIAGNOSIS — T8453XA Infection and inflammatory reaction due to internal right knee prosthesis, initial encounter: Secondary | ICD-10-CM | POA: Diagnosis not present

## 2017-11-08 DIAGNOSIS — T8484XD Pain due to internal orthopedic prosthetic devices, implants and grafts, subsequent encounter: Secondary | ICD-10-CM | POA: Diagnosis not present

## 2017-11-08 DIAGNOSIS — Z96651 Presence of right artificial knee joint: Secondary | ICD-10-CM | POA: Diagnosis not present

## 2017-11-20 DIAGNOSIS — T8484XD Pain due to internal orthopedic prosthetic devices, implants and grafts, subsequent encounter: Secondary | ICD-10-CM | POA: Diagnosis not present

## 2017-11-20 DIAGNOSIS — Z96653 Presence of artificial knee joint, bilateral: Secondary | ICD-10-CM | POA: Diagnosis not present

## 2017-12-04 ENCOUNTER — Other Ambulatory Visit: Payer: Self-pay | Admitting: Physician Assistant

## 2017-12-04 DIAGNOSIS — M150 Primary generalized (osteo)arthritis: Principal | ICD-10-CM

## 2017-12-04 DIAGNOSIS — M159 Polyosteoarthritis, unspecified: Secondary | ICD-10-CM

## 2017-12-06 ENCOUNTER — Ambulatory Visit (INDEPENDENT_AMBULATORY_CARE_PROVIDER_SITE_OTHER): Payer: Medicare HMO | Admitting: Physician Assistant

## 2017-12-06 ENCOUNTER — Encounter: Payer: Self-pay | Admitting: Physician Assistant

## 2017-12-06 VITALS — BP 144/72 | HR 69 | Ht 63.0 in | Wt 206.0 lb

## 2017-12-06 DIAGNOSIS — M150 Primary generalized (osteo)arthritis: Secondary | ICD-10-CM

## 2017-12-06 DIAGNOSIS — R609 Edema, unspecified: Secondary | ICD-10-CM | POA: Diagnosis not present

## 2017-12-06 DIAGNOSIS — E538 Deficiency of other specified B group vitamins: Secondary | ICD-10-CM

## 2017-12-06 DIAGNOSIS — M159 Polyosteoarthritis, unspecified: Secondary | ICD-10-CM

## 2017-12-06 DIAGNOSIS — M810 Age-related osteoporosis without current pathological fracture: Secondary | ICD-10-CM | POA: Diagnosis not present

## 2017-12-06 DIAGNOSIS — M8949 Other hypertrophic osteoarthropathy, multiple sites: Secondary | ICD-10-CM | POA: Insufficient documentation

## 2017-12-06 HISTORY — DX: Deficiency of other specified B group vitamins: E53.8

## 2017-12-06 MED ORDER — CELECOXIB 200 MG PO CAPS: 200.0000 mg | ORAL_CAPSULE | Freq: Every day | ORAL | 3 refills | Status: DC

## 2017-12-06 MED ORDER — TRAMADOL HCL 50 MG PO TABS: 100.0000 mg | ORAL_TABLET | Freq: Every day | ORAL | 5 refills | Status: DC

## 2017-12-06 MED ORDER — FUROSEMIDE 20 MG PO TABS: ORAL_TABLET | ORAL | 3 refills | Status: DC

## 2017-12-06 NOTE — Patient Instructions (Signed)
In a few days you may receive a survey in the mail or online from [employer] regarding your visit with us today. Please take a moment to fill this out. Your feedback is very important to our whole office. It can help us better understand your needs as well as improve your experience and satisfaction. Thank you for taking your time to complete it. We care about you.  Corah Willeford, PA-C  

## 2017-12-06 NOTE — Progress Notes (Signed)
BP (!) 144/72   Pulse 69   Ht 5' 3"  (1.6 m)   Wt 206 lb (93.4 kg)   BMI 36.49 kg/m    Subjective:    Patient ID: Melissa Zavala, female    DOB: [birth year], 77 y.o.   MRN: [id number]  HPI: Melissa Zavala is a 77 y.o. female presenting on 12/06/2017 for Hypertension (medication refill)  This patient is coming in for 22-monthrecheck on her chronic pain medication.  She has Ultram available for her severe osteoarthritis.  Her right knee is giving her trouble even though she had a surgery.  They have ruled out that it is infection.  They are doing some more testing in coming weeks.  She states most days she is takes to Ultram.  Occasionally she will take 3.  We have also discussed the possibility of osteoporosis.  She has not had a DEXA done in a few years.  Her orthopedist had discussed having it done.  Not letter know that we can do it here in our clinic.  She also has vitamin B12 and vitamin D deficiencies.  We will draw labs today.  Past Medical History:  Diagnosis Date  . Arthritis   . Hypertension    Relevant past medical, surgical, family and social history reviewed and updated as indicated. Interim medical history since our last visit reviewed. Allergies and medications reviewed and updated. DATA REVIEWED: CHART IN EPIC  Family History reviewed for pertinent findings.  Review of Systems  Allergies as of 12/06/2017      Reactions   Iodinated Diagnostic Agents Anaphylaxis   Codeine Other (See Comments)   Hallucinations   Diphenhydramine Other (See Comments)   Patient states "feels like somebody is inside of me clawing me out"      Medication List        Accurate as of 12/06/17  9:02 AM. Always use your most recent med list.          amLODipine 10 MG tablet Commonly known as:  NORVASC Take 1 tablet (10 mg total) by mouth daily.   celecoxib 200 MG capsule Commonly known as:  CELEBREX Take 1 capsule (200 mg total) by mouth daily.   esomeprazole 40 MG  capsule Commonly known as:  NEXIUM TAKE (1) CAPSULE DAILY.   furosemide 20 MG tablet Commonly known as:  LASIX TAKE ONE TABLET DAILY. WHEN LOTS OF SWELLING.   lisinopril 30 MG tablet Commonly known as:  PRINIVIL,ZESTRIL Take 1 tablet (30 mg total) by mouth daily.   potassium chloride 10 MEQ tablet Commonly known as:  K-DUR Take 1 tablet (10 mEq total) by mouth daily.   traMADol 50 MG tablet Commonly known as:  ULTRAM Take 2 tablets (100 mg total) by mouth daily.          Objective:    BP (!) 144/72   Pulse 69   Ht 5' 3"  (1.6 m)   Wt 206 lb (93.4 kg)   BMI 36.49 kg/m   Allergies  Allergen Reactions  . Iodinated Diagnostic Agents Anaphylaxis  . Codeine Other (See Comments)    Hallucinations  . Diphenhydramine Other (See Comments)    Patient states "feels like somebody is inside of me clawing me out"    Wt Readings from Last 3 Encounters:  12/06/17 206 lb (93.4 kg)  08/13/17 211 lb 14.4 oz (96.1 kg)  06/29/17 207 lb (93.9 kg)    Physical Exam  Results for orders placed or performed  in visit on 08/13/17  Urine Culture  Result Value Ref Range   Urine Culture, Routine Final report    Organism ID, Bacteria Comment   CBC with Differential/Platelet  Result Value Ref Range   WBC 8.4 3.4 - 10.8 x10E3/uL   RBC 4.43 3.77 - 5.28 x10E6/uL   Hemoglobin 11.9 11.1 - 15.9 g/dL   Hematocrit 36.9 34.0 - 46.6 %   MCV 83 79 - 97 fL   MCH 26.9 26.6 - 33.0 pg   MCHC 32.2 31.5 - 35.7 g/dL   RDW 14.6 12.3 - 15.4 %   Platelets 269 150 - 379 x10E3/uL   Neutrophils 56 Not Estab. %   Lymphs 35 Not Estab. %   Monocytes 6 Not Estab. %   Eos 3 Not Estab. %   Basos 0 Not Estab. %   Neutrophils Absolute 4.7 1.4 - 7.0 x10E3/uL   Lymphocytes Absolute 2.9 0.7 - 3.1 x10E3/uL   Monocytes Absolute 0.5 0.1 - 0.9 x10E3/uL   EOS (ABSOLUTE) 0.3 0.0 - 0.4 x10E3/uL   Basophils Absolute 0.0 0.0 - 0.2 x10E3/uL   Immature Granulocytes 0 Not Estab. %   Immature Grans (Abs) 0.0 0.0 - 0.1  x10E3/uL  CMP14+EGFR  Result Value Ref Range   Glucose 94 65 - 99 mg/dL   BUN 19 8 - 27 mg/dL   Creatinine, Ser 0.83 0.57 - 1.00 mg/dL   GFR calc non Af Amer 69 >59 mL/min/1.73   GFR calc Af Amer 79 >59 mL/min/1.73   BUN/Creatinine Ratio 23 12 - 28   Sodium 142 134 - 144 mmol/L   Potassium 4.2 3.5 - 5.2 mmol/L   Chloride 101 96 - 106 mmol/L   CO2 25 20 - 29 mmol/L   Calcium 10.0 8.7 - 10.3 mg/dL   Total Protein 6.9 6.0 - 8.5 g/dL   Albumin 4.4 3.5 - 4.8 g/dL   Globulin, Total 2.5 1.5 - 4.5 g/dL   Albumin/Globulin Ratio 1.8 1.2 - 2.2   Bilirubin Total <0.2 0.0 - 1.2 mg/dL   Alkaline Phosphatase 70 39 - 117 IU/L   AST 10 0 - 40 IU/L   ALT 10 0 - 32 IU/L  TSH  Result Value Ref Range   TSH 0.455 0.450 - 4.500 uIU/mL  Urinalysis, Complete  Result Value Ref Range   Specific Gravity, UA 1.010 1.005 - 1.030   pH, UA 6.5 5.0 - 7.5   Color, UA Yellow Yellow   Appearance Ur Clear Clear   Leukocytes, UA Negative Negative   Protein, UA Negative Negative/Trace   Glucose, UA Negative Negative   Ketones, UA Negative Negative   RBC, UA Negative Negative   Bilirubin, UA Negative Negative   Urobilinogen, Ur 0.2 0.2 - 1.0 mg/dL   Nitrite, UA Negative Negative      Assessment & Plan:   1. Primary osteoarthritis involving multiple joints - traMADol (ULTRAM) 50 MG tablet; Take 2 tablets (100 mg total) by mouth daily.  Dispense: 90 tablet; Refill: 5 - celecoxib (CELEBREX) 200 MG capsule; Take 1 capsule (200 mg total) by mouth daily.  Dispense: 90 capsule; Refill: 3 - Vitamin D 1,25 dihydroxy  2. Generalized OA - traMADol (ULTRAM) 50 MG tablet; Take 2 tablets (100 mg total) by mouth daily.  Dispense: 90 tablet; Refill: 5  3. Vitamin B12 deficiency - Vitamin B12  4. Edema, unspecified type - furosemide (LASIX) 20 MG tablet; TAKE ONE TABLET DAILY. WHEN LOTS OF SWELLING.  Dispense: 90 tablet; Refill: 3  Continue all other maintenance medications as listed above.  Follow up  plan: Return in about 3 months (around 03/08/2018) for recheck.  Educational handout given for [place] PA-C Jessie [address]  [place], [place] [address] [phone]   12/06/2017, 9:02 AM

## 2017-12-11 LAB — VITAMIN D 1,25 DIHYDROXY
VITAMIN D 1, 25 (OH) TOTAL: 53 pg/mL
Vitamin D2 1, 25 (OH)2: 25 pg/mL
Vitamin D3 1, 25 (OH)2: 28 pg/mL

## 2017-12-11 LAB — VITAMIN B12: VITAMIN B 12: 243 pg/mL (ref 232–1245)

## 2017-12-12 DIAGNOSIS — T8484XD Pain due to internal orthopedic prosthetic devices, implants and grafts, subsequent encounter: Secondary | ICD-10-CM | POA: Diagnosis not present

## 2017-12-12 DIAGNOSIS — M1611 Unilateral primary osteoarthritis, right hip: Secondary | ICD-10-CM | POA: Diagnosis not present

## 2017-12-12 DIAGNOSIS — Z96653 Presence of artificial knee joint, bilateral: Secondary | ICD-10-CM | POA: Diagnosis not present

## 2017-12-12 DIAGNOSIS — M8580 Other specified disorders of bone density and structure, unspecified site: Secondary | ICD-10-CM | POA: Diagnosis not present

## 2017-12-12 DIAGNOSIS — M47817 Spondylosis without myelopathy or radiculopathy, lumbosacral region: Secondary | ICD-10-CM | POA: Diagnosis not present

## 2017-12-12 DIAGNOSIS — Z96651 Presence of right artificial knee joint: Secondary | ICD-10-CM | POA: Diagnosis not present

## 2017-12-13 DIAGNOSIS — T8484XA Pain due to internal orthopedic prosthetic devices, implants and grafts, initial encounter: Secondary | ICD-10-CM | POA: Insufficient documentation

## 2018-01-07 DIAGNOSIS — Z9189 Other specified personal risk factors, not elsewhere classified: Secondary | ICD-10-CM | POA: Insufficient documentation

## 2018-01-10 ENCOUNTER — Ambulatory Visit (INDEPENDENT_AMBULATORY_CARE_PROVIDER_SITE_OTHER): Payer: Medicare HMO | Admitting: *Deleted

## 2018-01-10 DIAGNOSIS — E538 Deficiency of other specified B group vitamins: Secondary | ICD-10-CM | POA: Diagnosis not present

## 2018-01-10 MED ORDER — CYANOCOBALAMIN 1000 MCG/ML IJ SOLN
1000.0000 ug | INTRAMUSCULAR | Status: AC
  Administered 2018-01-10 – 2018-12-16 (×5): 1000 ug via INTRAMUSCULAR

## 2018-01-10 NOTE — Progress Notes (Signed)
Pt given B12 shot IM left deltoid and tolerated well.

## 2018-01-18 ENCOUNTER — Encounter (HOSPITAL_COMMUNITY): Payer: Self-pay | Admitting: Emergency Medicine

## 2018-01-18 ENCOUNTER — Other Ambulatory Visit: Payer: Self-pay

## 2018-01-18 ENCOUNTER — Emergency Department (HOSPITAL_COMMUNITY): Payer: Medicare HMO

## 2018-01-18 ENCOUNTER — Emergency Department (HOSPITAL_COMMUNITY)
Admission: EM | Admit: 2018-01-18 | Discharge: 2018-01-18 | Disposition: A | Discharge status: Home / Self Care | Payer: Medicare HMO | Attending: Emergency Medicine | Admitting: Emergency Medicine

## 2018-01-18 DIAGNOSIS — W010XXA Fall on same level from slipping, tripping and stumbling without subsequent striking against object, initial encounter: Secondary | ICD-10-CM | POA: Insufficient documentation

## 2018-01-18 DIAGNOSIS — Y92128 Other place in nursing home as the place of occurrence of the external cause: Secondary | ICD-10-CM | POA: Diagnosis not present

## 2018-01-18 DIAGNOSIS — S60221A Contusion of right hand, initial encounter: Secondary | ICD-10-CM | POA: Insufficient documentation

## 2018-01-18 DIAGNOSIS — Z79899 Other long term (current) drug therapy: Secondary | ICD-10-CM | POA: Diagnosis not present

## 2018-01-18 DIAGNOSIS — R531 Weakness: Secondary | ICD-10-CM | POA: Diagnosis not present

## 2018-01-18 DIAGNOSIS — I10 Essential (primary) hypertension: Secondary | ICD-10-CM | POA: Insufficient documentation

## 2018-01-18 DIAGNOSIS — Y9389 Activity, other specified: Secondary | ICD-10-CM | POA: Insufficient documentation

## 2018-01-18 DIAGNOSIS — M7989 Other specified soft tissue disorders: Secondary | ICD-10-CM | POA: Diagnosis not present

## 2018-01-18 DIAGNOSIS — W19XXXA Unspecified fall, initial encounter: Secondary | ICD-10-CM | POA: Diagnosis not present

## 2018-01-18 DIAGNOSIS — Z96653 Presence of artificial knee joint, bilateral: Secondary | ICD-10-CM | POA: Diagnosis not present

## 2018-01-18 DIAGNOSIS — Y999 Unspecified external cause status: Secondary | ICD-10-CM | POA: Insufficient documentation

## 2018-01-18 DIAGNOSIS — R52 Pain, unspecified: Secondary | ICD-10-CM | POA: Diagnosis not present

## 2018-01-18 DIAGNOSIS — M79641 Pain in right hand: Secondary | ICD-10-CM | POA: Diagnosis not present

## 2018-01-18 DIAGNOSIS — S6991XA Unspecified injury of right wrist, hand and finger(s), initial encounter: Secondary | ICD-10-CM | POA: Diagnosis not present

## 2018-01-18 MED ORDER — IBUPROFEN 400 MG PO TABS
400.0000 mg | ORAL_TABLET | Freq: Once | ORAL | Status: AC
  Administered 2018-01-18: 400 mg via ORAL
  Filled 2018-01-18: qty 1

## 2018-01-18 MED ORDER — MELOXICAM 7.5 MG PO TABS: 15.0000 mg | ORAL_TABLET | Freq: Every day | ORAL | 0 refills | Status: DC

## 2018-01-18 NOTE — ED Triage Notes (Signed)
Pt tripped and fell pta and caught herself with right hand. Pt c/o r hand pain. Posterior swelling noted. Radial pulses present. A/o. denies hitting head, denies pain to legs/knees or  Hips. Pt ambulatory.

## 2018-01-18 NOTE — ED Provider Notes (Signed)
[facility] EMERGENCY DEPARTMENT Provider Note   CSN: [id number] Arrival date & time: 01/18/18  1445     History   Chief Complaint Chief Complaint  Patient presents with  . Fall    hand pain    HPI Melissa Zavala is a 77 y.o. female.  HPI  77 year old female, visiting family member at the nursing facility when she was walking down the hallway.  She tripped over her foot, fell to the ground catching herself with her right hand and complained of acute onset of pain in the right hand.  This is located primarily over the dorsal aspect of the hand, with some swelling over the distal second metacarpal.  She denies deformity, denies bleeding, denies open wound, denies any numbness.  She denies any other injuries including head injury neck pain or lower extremity injuries.  She was able to get up and walk.  She denies prior history of fractures of her hand and in fact is never had any broken bones, she states that she is scheduled for knee surgery in 3 weeks.  No medications given prior to arrival.  Past Medical History:  Diagnosis Date  . Arthritis   . Hypertension     Patient Active Problem List   Diagnosis Date Noted  . Primary osteoarthritis involving multiple joints 12/06/2017  . Edema 12/06/2017  . Vitamin B12 deficiency 12/06/2017  . Bilateral knee swelling 08/14/2017  . Vertigo 08/14/2017  . Acute pain of right knee 05/30/2017  . Essential hypertension 02/19/2017  . Gastroesophageal reflux disease without esophagitis 06/11/2016  . Personal history of ovarian cyst 06/11/2016  . Pain in joint involving left pelvic region and thigh 06/11/2016  . Age-related osteoporosis without current pathological fracture 06/11/2016  . Screening for breast cancer 06/11/2016  . Bilateral carpal tunnel syndrome 06/09/2016  . Generalized OA 06/09/2016  . Peripheral edema 01/03/2016  . Morbid obesity with BMI of 40.0-44.9, adult ([place]) 01/03/2016  . Right shoulder pain 07/06/2015    Past  Surgical History:  Procedure Laterality Date  . TOTAL KNEE ARTHROPLASTY    . TOTAL KNEE ARTHROPLASTY Bilateral   . TUBAL LIGATION       OB History   None      Home Medications    Prior to Admission medications   Medication Sig Start Date End Date Taking? Authorizing Provider  amLODipine (NORVASC) 10 MG tablet Take 1 tablet (10 mg total) by mouth daily. 02/19/17   Terald Sleeper, PA-C  celecoxib (CELEBREX) 200 MG capsule Take 1 capsule (200 mg total) by mouth daily. 12/06/17   Terald Sleeper, PA-C  esomeprazole (NEXIUM) 40 MG capsule TAKE (1) CAPSULE DAILY. 02/19/17   Terald Sleeper, PA-C  furosemide (LASIX) 20 MG tablet TAKE ONE TABLET DAILY. WHEN LOTS OF SWELLING. 12/06/17   Terald Sleeper, PA-C  lisinopril (PRINIVIL,ZESTRIL) 30 MG tablet Take 1 tablet (30 mg total) by mouth daily. 02/19/17   Terald Sleeper, PA-C  meloxicam (MOBIC) 7.5 MG tablet Take 2 tablets (15 mg total) by mouth daily. 01/18/18   Noemi Chapel, MD  potassium chloride (K-DUR) 10 MEQ tablet Take 1 tablet (10 mEq total) by mouth daily. 02/19/17   Terald Sleeper, PA-C  traMADol (ULTRAM) 50 MG tablet Take 2 tablets (100 mg total) by mouth daily. 12/06/17   Terald Sleeper, PA-C    Family History Family History  Problem Relation Age of Onset  . Cancer Mother        Breast  Social History Social History   Tobacco Use  . Smoking status: Never Smoker  . Smokeless tobacco: Never Used  Substance Use Topics  . Alcohol use: No  . Drug use: No     Allergies   Iodinated diagnostic agents; Codeine; and Diphenhydramine   Review of Systems Review of Systems  Musculoskeletal: Positive for joint swelling. Negative for myalgias and neck pain.  Neurological: Negative for weakness and numbness.  Hematological: Does not bruise/bleed easily.     Physical Exam Updated Vital Signs BP (!) 109/54   Pulse 71   Temp 98 F (36.7 C) (Oral)   Resp 16   SpO2 96%   Physical Exam  Constitutional: She appears  well-developed and well-nourished.  HENT:  Head: Normocephalic and atraumatic.  Eyes: Conjunctivae are normal. Right eye exhibits no discharge. Left eye exhibits no discharge.  Pulmonary/Chest: Effort normal. No respiratory distress.  Musculoskeletal: Normal range of motion. She exhibits tenderness. She exhibits no edema or deformity.  Normal ROM of the R  Hand at all joints including the MCP's but has ttp over the 2-5th MC's distally - no pain or swelling or ttp in the wrist - no open wounds.  Elbow and wrist and shoulder on the R without pain or ttp or deformity  Neurological: She is alert. Coordination normal.  Skin: Skin is warm and dry. No rash noted. She is not diaphoretic. No erythema.  Psychiatric: She has a normal mood and affect.  Nursing note and vitals reviewed.    ED Treatments / Results  Labs (all labs ordered are listed, but only abnormal results are displayed) Labs Reviewed - No data to display  EKG None  Radiology Dg Hand Complete Right  Result Date: 01/18/2018 CLINICAL DATA:  Fall today where patient went to catch herself injuring her right hand. Patient states posterior pain and swelling near MCP joints of index finger and middle finger. EXAM: RIGHT HAND - COMPLETE 3+ VIEW COMPARISON:  None. FINDINGS: No fracture.  No bone lesion. Joints are normally aligned. Bones are demineralized. Soft tissues are unremarkable. IMPRESSION: No fracture or dislocation. Electronically Signed   By: Lajean Manes M.D.   On: 01/18/2018 15:41    Procedures Procedures (including critical care time)  Medications Ordered in ED Medications  ibuprofen (ADVIL,MOTRIN) tablet 400 mg (400 mg Oral Given 01/18/18 1521)     Initial Impression / Assessment and Plan / ED Course  I have reviewed the triage vital signs and the nursing notes.  Pertinent labs & imaging results that were available during my care of the patient were reviewed by me and considered in my medical decision making (see  chart for details).  Clinical Course as of Jan 19 1603  Fri Jan 18, 2018  1602 I have personally viewed the x-rays, there is no signs of fracture, no signs of dislocation, no signs of scaphoid injury of the wrist and the patient has no tenderness over the scaphoid bone.  Stable for discharge with anti-inflammatories, volar Velcro wrist splint for support.  Patient agreeable   [BM]    Clinical Course User Index [BM] Noemi Chapel, MD   R/o frx - ice and nsaids, pt agreeable.  Final Clinical Impressions(s) / ED Diagnoses   Final diagnoses:  Contusion of right hand, initial encounter    ED Discharge Orders         Ordered    meloxicam (MOBIC) 7.5 MG tablet  Daily     01/18/18 1603  Noemi Chapel, MD 01/18/18 [phone]

## 2018-01-18 NOTE — Discharge Instructions (Addendum)
Your x-rays reveal no signs of broken bones, we have given you a splint to help with pain, apply ice and elevate your hand, it should improve over the next couple of days, you may take Mobic 7.5 mg twice a day as needed for pain but if you take this medication do not take your celecoxib as they are similar medications and that may cause kidney problems or stomach ulcers if you take them together.  See your doctor in 1 week for recheck   Emergency department for increasing redness swelling pain or fever

## 2018-01-28 ENCOUNTER — Ambulatory Visit: Payer: Medicare HMO | Attending: Orthopaedic Surgery | Admitting: Physical Therapy

## 2018-01-28 ENCOUNTER — Encounter: Payer: Self-pay | Admitting: Physical Therapy

## 2018-01-28 DIAGNOSIS — G8929 Other chronic pain: Secondary | ICD-10-CM | POA: Insufficient documentation

## 2018-01-28 DIAGNOSIS — M25661 Stiffness of right knee, not elsewhere classified: Secondary | ICD-10-CM | POA: Diagnosis not present

## 2018-01-28 DIAGNOSIS — M25561 Pain in right knee: Secondary | ICD-10-CM | POA: Diagnosis not present

## 2018-01-28 NOTE — Therapy (Addendum)
[facility] [place], [place], [address] Phone: [phone]   Fax:  [phone]  Physical Therapy Evaluation PHYSICAL THERAPY DISCHARGE SUMMARY  Visits from Start of Care: 1  Current functional level related to goals / functional outcomes: See below   Remaining deficits: See goals   Education / Equipment:HEP Plan: Patient agrees to discharge.  Patient goals were not met. Patient is being discharged due to not returning since the last visit.  ?????     Patient Details  Name: Melissa Zavala MRN: [id number] Date of Birth: 1940-10-13 Referring Provider: Pennie Rushing MD.   Encounter Date: 01/28/2018  PT End of Session - 01/28/18 1106    Visit Number  1    Number of Visits  13    Date for PT Re-Evaluation  04/28/18    PT Start Time  1030    PT Stop Time  1055    PT Time Calculation (min)  25 min    Activity Tolerance  Patient tolerated treatment well    Behavior During Therapy  [facility] for tasks assessed/performed       Past Medical History:  Diagnosis Date  . Arthritis   . Hypertension     Past Surgical History:  Procedure Laterality Date  . TOTAL KNEE ARTHROPLASTY    . TOTAL KNEE ARTHROPLASTY Bilateral   . TUBAL LIGATION      There were no vitals filed for this visit.   Subjective Assessment - 01/28/18 1119    Subjective  The patient presents to the clinic today for a pre-op visit prior to a right knee surgery scheduled for later this week.  Her right knee has had persistent pain for a long time.  Her pain is an 8/10 "all the time."  She reports her pain is a 10/10 when she rolls over in bed at night.  She has not found anything really helps her.  She has had PT earlier this year and has a comprehensive HEP with ictures that she is going to continue with.    Pertinent History  Arthritis, bilateral TKA's.    Limitations  Walking    How long can you walk comfortably?  Short community distances.    Patient  Stated Goals  Have surgery and get out of pain.     Pain Score  8     Pain Location  Knee    Pain Orientation  Right    Pain Descriptors / Indicators  Aching;Sharp;Shooting    Pain Type  Chronic pain         [facility] PT Assessment - 01/28/18 0001      Assessment   Medical Diagnosis  Right knee pain.    Referring Provider  Pennie Rushing MD.    Onset Date/Surgical Date  --   Ongoing.     Precautions   Precautions  --   No ultrasound.     Restrictions   Weight Bearing Restrictions  No      Balance Screen   Has the patient fallen in the past 6 months  Yes    Has the patient had a decrease in activity level because of a fear of falling?   Yes    Is the patient reluctant to leave their home because of a fear of falling?   No      Home Environment   Living Environment  Private residence    Additional Comments  Ramp.      Prior Function   Level  of Independence  Independent      ROM / Strength   AROM / PROM / Strength  AROM;Strength      AROM   Overall AROM Comments  Right knee AROM is:  0 to 102 degrees.      Strength   Overall Strength Comments  Right hip strength= 4-/5 and right knee strength= 4+/5.      Palpation   Palpation comment  Patient reports pain "in" knee and she has referred pain distally over her tibia.      Transfers   Comments  Independent.      Ambulation/Gait   Gait Comments  The patient scissored x 1 but did not lose balance.  Shortened step and stride length.  Recommended cane usage.                Objective measurements completed on examination: See above findings.                   PT Long Term Goals - 01/28/18 1221      PT LONG TERM GOAL #1   Title  Patient will be independent with HEP    Time  6    Period  Weeks    Status  New      PT LONG TERM GOAL #2   Title  Patient will improve right knee strength to 5/5 to perform functional activites    Time  6    Period  Weeks    Status  New      PT LONG TERM GOAL #3    Title  Patient will report less than or equal to 2/10 pain in right knee when transitioning from sit to stand.    Time  6    Period  Weeks    Status  New      PT LONG TERM GOAL #4   Title  Patient will improve right  knee flexion AROM to 115 degrees.    Time  6    Period  Weeks    Status  New      PT LONG TERM GOAL #5   Title  Walk a community distance with pain not > 3/10.    Time  6    Period  Weeks    Status  New             Plan - 01/28/18 1211    Clinical Impression Statement  The patient presents to OPPT for a pre-op visit.  She has chronic right knee pain and sttaes she had a TKA ~15 to16 years ago.  She reports a great deal of pain today "in" her knee.  She has had PT before and is well educated in her HEP.    History and Personal Factors relevant to plan of care:  Bilateral TKA's; arthritis.    Clinical Presentation  Evolving    Clinical Presentation due to:  Not imroving.    Clinical Decision Making  Low    Rehab Potential  Good    PT Frequency  2x / week   2 times a week for 6 weeks post-surgery.   PT Duration  6 weeks    PT Treatment/Interventions  ADLs/Self Care Home Management;Cryotherapy;Electrical Stimulation;Therapeutic activities;Therapeutic exercise;Patient/family education;Neuromuscular re-education;Manual techniques;Passive range of motion;Vasopneumatic Device    PT Next Visit Plan  Right TKA protocol post-surgery.  Will update, however, post- surgery.    Consulted and Agree with Plan of Care  Patient  Patient will benefit from skilled therapeutic intervention in order to improve the following deficits and impairments:  Abnormal gait, Pain, Decreased activity tolerance, Decreased strength, Decreased range of motion  Visit Diagnosis: Stiffness of right knee, not elsewhere classified - Plan: PT plan of care cert/re-cert  Chronic pain of right knee - Plan: PT plan of care cert/re-cert     Problem List Patient Active Problem List    Diagnosis Date Noted  . Primary osteoarthritis involving multiple joints 12/06/2017  . Edema 12/06/2017  . Vitamin B12 deficiency 12/06/2017  . Bilateral knee swelling 08/14/2017  . Vertigo 08/14/2017  . Acute pain of right knee 05/30/2017  . Essential hypertension 02/19/2017  . Gastroesophageal reflux disease without esophagitis 06/11/2016  . Personal history of ovarian cyst 06/11/2016  . Pain in joint involving left pelvic region and thigh 06/11/2016  . Age-related osteoporosis without current pathological fracture 06/11/2016  . Screening for breast cancer 06/11/2016  . Bilateral carpal tunnel syndrome 06/09/2016  . Generalized OA 06/09/2016  . Peripheral edema 01/03/2016  . Morbid obesity with BMI of 40.0-44.9, adult ([place]) 01/03/2016  . Right shoulder pain 07/06/2015    Mckenna Boruff, [place] MPT 01/28/2018, 12:27 PM  [facility] [address] [place], [place], [address] Phone: [phone]   Fax:  [phone]  Name: MYESHA STILLION MRN: [id number] Date of Birth: 11/28/1940

## 2018-02-06 DIAGNOSIS — M160 Bilateral primary osteoarthritis of hip: Secondary | ICD-10-CM | POA: Diagnosis not present

## 2018-02-06 DIAGNOSIS — Z01818 Encounter for other preprocedural examination: Secondary | ICD-10-CM | POA: Diagnosis not present

## 2018-02-06 DIAGNOSIS — M25551 Pain in right hip: Secondary | ICD-10-CM | POA: Diagnosis not present

## 2018-02-11 ENCOUNTER — Ambulatory Visit (INDEPENDENT_AMBULATORY_CARE_PROVIDER_SITE_OTHER): Payer: Medicare HMO | Admitting: *Deleted

## 2018-02-11 DIAGNOSIS — Z23 Encounter for immunization: Secondary | ICD-10-CM | POA: Diagnosis not present

## 2018-02-11 DIAGNOSIS — E538 Deficiency of other specified B group vitamins: Secondary | ICD-10-CM | POA: Diagnosis not present

## 2018-02-11 NOTE — Progress Notes (Signed)
Pt given Cyanocobalamin inj Tolerated well 

## 2018-03-06 DIAGNOSIS — M1611 Unilateral primary osteoarthritis, right hip: Secondary | ICD-10-CM | POA: Diagnosis not present

## 2018-03-11 DIAGNOSIS — R9431 Abnormal electrocardiogram [ECG] [EKG]: Secondary | ICD-10-CM | POA: Diagnosis not present

## 2018-03-11 DIAGNOSIS — I517 Cardiomegaly: Secondary | ICD-10-CM | POA: Diagnosis not present

## 2018-03-11 DIAGNOSIS — Z01810 Encounter for preprocedural cardiovascular examination: Secondary | ICD-10-CM | POA: Diagnosis not present

## 2018-03-12 ENCOUNTER — Encounter: Payer: Self-pay | Admitting: Physician Assistant

## 2018-03-12 ENCOUNTER — Ambulatory Visit (INDEPENDENT_AMBULATORY_CARE_PROVIDER_SITE_OTHER): Payer: Medicare HMO | Admitting: Physician Assistant

## 2018-03-12 DIAGNOSIS — E538 Deficiency of other specified B group vitamins: Secondary | ICD-10-CM

## 2018-03-12 DIAGNOSIS — M150 Primary generalized (osteo)arthritis: Secondary | ICD-10-CM | POA: Diagnosis not present

## 2018-03-12 DIAGNOSIS — M159 Polyosteoarthritis, unspecified: Secondary | ICD-10-CM

## 2018-03-12 DIAGNOSIS — I10 Essential (primary) hypertension: Secondary | ICD-10-CM | POA: Diagnosis not present

## 2018-03-12 DIAGNOSIS — K219 Gastro-esophageal reflux disease without esophagitis: Secondary | ICD-10-CM

## 2018-03-12 MED ORDER — TRAMADOL HCL 50 MG PO TABS: 100.0000 mg | ORAL_TABLET | Freq: Four times a day (QID) | ORAL | 5 refills | Status: DC | PRN

## 2018-03-12 MED ORDER — POTASSIUM CHLORIDE ER 10 MEQ PO TBCR: 10.0000 meq | EXTENDED_RELEASE_TABLET | Freq: Every day | ORAL | 11 refills | Status: DC

## 2018-03-12 MED ORDER — ESTROGENS, CONJUGATED 0.625 MG/GM VA CREA: 1.0000 | TOPICAL_CREAM | Freq: Every day | VAGINAL | 12 refills | Status: DC

## 2018-03-12 MED ORDER — NYSTATIN 100000 UNIT/GM EX POWD: Freq: Four times a day (QID) | CUTANEOUS | 5 refills | Status: DC

## 2018-03-12 MED ORDER — LISINOPRIL 30 MG PO TABS: 30.0000 mg | ORAL_TABLET | Freq: Every day | ORAL | 11 refills | Status: DC

## 2018-03-12 MED ORDER — AMLODIPINE BESYLATE 10 MG PO TABS: 10.0000 mg | ORAL_TABLET | Freq: Every day | ORAL | 11 refills | Status: DC

## 2018-03-12 MED ORDER — ESOMEPRAZOLE MAGNESIUM 40 MG PO CPDR: DELAYED_RELEASE_CAPSULE | ORAL | 11 refills | Status: DC

## 2018-03-12 NOTE — Patient Instructions (Signed)
In a few days you may receive a survey in the mail or online from [employer] regarding your visit with us today. Please take a moment to fill this out. Your feedback is very important to our whole office. It can help us better understand your needs as well as improve your experience and satisfaction. Thank you for taking your time to complete it. We care about you.  Shani Fitch, PA-C  

## 2018-03-14 NOTE — Progress Notes (Signed)
BP 121/61   Pulse 81   Temp (!) 97.3 F (36.3 C) (Oral)   Ht 5\' 3"  (1.6 m)   Wt 194 lb 12.8 oz (88.4 kg)   BMI 34.51 kg/m    Subjective:    Patient ID: Melissa Zavala, female    DOB: [birth year], 77 y.o.   MRN: [id number]  HPI: Melissa Zavala is a 77 y.o. female presenting on 03/12/2018 for Hypertension (3 month )  This patient comes in for periodic recheck on medications and conditions including hypertension, GERD, OA. She will be havin hip replacement soon, anterior approach.  NEW narcotic contract is reviewed and signed at today's visit.   All medications are reviewed today. There are no reports of any problems with the medications. All of the medical conditions are reviewed and updated.  Lab work is reviewed and will be ordered as medically necessary. There are no new problems reported with today's visit.   Past Medical History:  Diagnosis Date  . Arthritis   . Hypertension    Relevant past medical, surgical, family and social history reviewed and updated as indicated. Interim medical history since our last visit reviewed. Allergies and medications reviewed and updated. DATA REVIEWED: CHART IN EPIC  Family History reviewed for pertinent findings.  Review of Systems  Constitutional: Negative.  Negative for activity change, fatigue and fever.  HENT: Negative.   Eyes: Negative.   Respiratory: Negative.  Negative for cough.   Cardiovascular: Negative.  Negative for chest pain.  Gastrointestinal: Negative.  Negative for abdominal pain.  Endocrine: Negative.   Genitourinary: Negative.  Negative for dysuria.  Musculoskeletal: Negative.   Skin: Negative.   Neurological: Negative.     Allergies as of 03/12/2018      Reactions   Iodinated Diagnostic Agents Anaphylaxis   Codeine Other (See Comments)   Hallucinations   Diphenhydramine Other (See Comments)   Patient states "feels like somebody is inside of me clawing me out"      Medication List        Accurate  as of 03/12/18 11:59 PM. Always use your most recent med list.          amLODipine 10 MG tablet Commonly known as:  NORVASC Take 1 tablet (10 mg total) by mouth daily.   celecoxib 200 MG capsule Commonly known as:  CELEBREX Take 1 capsule (200 mg total) by mouth daily.   conjugated estrogens vaginal cream Commonly known as:  PREMARIN Place 1 Applicatorful vaginally daily.   esomeprazole 40 MG capsule Commonly known as:  NEXIUM TAKE (1) CAPSULE DAILY.   furosemide 20 MG tablet Commonly known as:  LASIX TAKE ONE TABLET DAILY. WHEN LOTS OF SWELLING.   lisinopril 30 MG tablet Commonly known as:  PRINIVIL,ZESTRIL Take 1 tablet (30 mg total) by mouth daily.   nystatin powder Commonly known as:  MYCOSTATIN/NYSTOP Apply topically 4 (four) times daily.   potassium chloride 10 MEQ tablet Commonly known as:  K-DUR Take 1 tablet (10 mEq total) by mouth daily.   traMADol 50 MG tablet Commonly known as:  ULTRAM Take 2 tablets (100 mg total) by mouth every 6 (six) hours as needed.          Objective:    BP 121/61   Pulse 81   Temp (!) 97.3 F (36.3 C) (Oral)   Ht 5\' 3"  (1.6 m)   Wt 194 lb 12.8 oz (88.4 kg)   BMI 34.51 kg/m   Allergies  Allergen Reactions  .  Iodinated Diagnostic Agents Anaphylaxis  . Codeine Other (See Comments)    Hallucinations  . Diphenhydramine Other (See Comments)    Patient states "feels like somebody is inside of me clawing me out"    Wt Readings from Last 3 Encounters:  03/12/18 194 lb 12.8 oz (88.4 kg)  12/06/17 206 lb (93.4 kg)  08/13/17 211 lb 14.4 oz (96.1 kg)    Physical Exam  Constitutional: She is oriented to person, place, and time. She appears well-developed and well-nourished.  HENT:  Head: Normocephalic and atraumatic.  Eyes: Pupils are equal, round, and reactive to light. Conjunctivae and EOM are normal.  Cardiovascular: Normal rate, regular rhythm, normal heart sounds and intact distal pulses.  Pulmonary/Chest: Effort  normal and breath sounds normal.  Abdominal: Soft. Bowel sounds are normal.  Neurological: She is alert and oriented to person, place, and time. She has normal reflexes.  Skin: Skin is warm and dry. No rash noted.  Psychiatric: She has a normal mood and affect. Her behavior is normal. Judgment and thought content normal.    Results for orders placed or performed in visit on 12/06/17  Vitamin B12  Result Value Ref Range   Vitamin B-12 243 232 - 1,245 pg/mL  Vitamin D 1,25 dihydroxy  Result Value Ref Range   Vitamin D 1, 25 (OH)2 Total 53 pg/mL   Vitamin D2 1, 25 (OH)2 25 pg/mL   Vitamin D3 1, 25 (OH)2 28 pg/mL      Assessment & Plan:   1. Essential hypertension - amLODipine (NORVASC) 10 MG tablet; Take 1 tablet (10 mg total) by mouth daily.  Dispense: 30 tablet; Refill: 11 - lisinopril (PRINIVIL,ZESTRIL) 30 MG tablet; Take 1 tablet (30 mg total) by mouth daily.  Dispense: 30 tablet; Refill: 11 - potassium chloride (K-DUR) 10 MEQ tablet; Take 1 tablet (10 mEq total) by mouth daily.  Dispense: 30 tablet; Refill: 11  2. Gastroesophageal reflux disease without esophagitis - esomeprazole (NEXIUM) 40 MG capsule; TAKE (1) CAPSULE DAILY.  Dispense: 30 capsule; Refill: 11  3. Primary osteoarthritis involving multiple joints - traMADol (ULTRAM) 50 MG tablet; Take 2 tablets (100 mg total) by mouth every 6 (six) hours as needed.  Dispense: 120 tablet; Refill: 5  4. Generalized OA - traMADol (ULTRAM) 50 MG tablet; Take 2 tablets (100 mg total) by mouth every 6 (six) hours as needed.  Dispense: 120 tablet; Refill: 5   Continue all other maintenance medications as listed above.  Follow up plan: Return in about 6 months (around 09/11/2018).  Educational handout given for [place] PA-C [place] [address]  [place], [place] [address] [phone]   03/14/2018, 10:27 PM

## 2018-03-16 ENCOUNTER — Ambulatory Visit (INDEPENDENT_AMBULATORY_CARE_PROVIDER_SITE_OTHER): Payer: Medicare HMO | Admitting: Pediatrics

## 2018-03-16 ENCOUNTER — Encounter: Payer: Self-pay | Admitting: Pediatrics

## 2018-03-16 VITALS — BP 153/75 | HR 75 | Temp 97.0°F | Ht 63.0 in | Wt 196.0 lb

## 2018-03-16 DIAGNOSIS — L0390 Cellulitis, unspecified: Secondary | ICD-10-CM

## 2018-03-16 MED ORDER — DOXYCYCLINE HYCLATE 100 MG PO TABS: 100.0000 mg | ORAL_TABLET | Freq: Two times a day (BID) | ORAL | 0 refills | Status: DC

## 2018-03-16 NOTE — Progress Notes (Signed)
  Subjective:   Patient ID: Melissa Zavala, female    DOB: 11-24-1940, 77 y.o.   MRN: [id number] CC: Abscess (left groin. It has drained. She has been applying some prescription cream)  HPI: Melissa Zavala is a 77 y.o. female   There is no does not 3 to 4 days ago.  Started draining 2 days ago.  Drainage had a smell, slightly discolored.  Felt much better after drainage started.  Still slightly tender now.  Has a hip surgery scheduled in 6 days.   No fevers or chills, has been feeling well otherwise.  Relevant past medical, surgical, family and social history reviewed. Allergies and medications reviewed and updated. Social History   Tobacco Use  Smoking Status Never Smoker  Smokeless Tobacco Never Used   ROS: Per HPI   Objective:    BP (!) 153/75 (BP Location: Right Arm, Patient Position: Sitting, Cuff Size: Large) Comment: no meds this morning  Pulse 75   Temp (!) 97 F (36.1 C) (Oral)   Ht 5\' 3"  (1.6 m)   Wt 196 lb (88.9 kg)   BMI 34.72 kg/m   Wt Readings from Last 3 Encounters:  03/16/18 196 lb (88.9 kg)  03/12/18 194 lb 12.8 oz (88.4 kg)  12/06/17 206 lb (93.4 kg)    Gen: NAD, alert, cooperative with exam, NCAT EYES: EOMI, no conjunctival injection, or no icterus CV: NRRR, normal S1/S2, no murmur, distal pulses 2+ b/l Resp: CTABL, no wheezes, normal WOB Abd: +BS, soft, NTND.  Ext: No edema, warm Neuro: Alert and oriented MSK: normal muscle bulk Skin: R upper labia with 31mm white based ulceration, <43mm of surrounding redness, apprx 1cm total surrounding induration. No fluctuance. Minimal tenderness.  Assessment & Plan:  Melissa Zavala was seen today for abscess.  Diagnoses and all orders for this visit:  Cellulitis, unspecified cellulitis site Abscess now draining, cont warm compresses, will treat with below. [place] precautions discussed. -     doxycycline (VIBRA-TABS) 100 MG tablet; Take 1 tablet (100 mg total) by mouth 2 (two) times daily.   Follow up plan: As  needed Assunta Found, MD [place]

## 2018-03-20 DIAGNOSIS — I10 Essential (primary) hypertension: Secondary | ICD-10-CM | POA: Diagnosis not present

## 2018-03-20 DIAGNOSIS — M1611 Unilateral primary osteoarthritis, right hip: Secondary | ICD-10-CM | POA: Diagnosis not present

## 2018-03-20 DIAGNOSIS — Z743 Need for continuous supervision: Secondary | ICD-10-CM | POA: Diagnosis not present

## 2018-03-20 DIAGNOSIS — R2689 Other abnormalities of gait and mobility: Secondary | ICD-10-CM | POA: Diagnosis not present

## 2018-03-20 DIAGNOSIS — G4733 Obstructive sleep apnea (adult) (pediatric): Secondary | ICD-10-CM | POA: Diagnosis not present

## 2018-03-20 DIAGNOSIS — E669 Obesity, unspecified: Secondary | ICD-10-CM | POA: Diagnosis not present

## 2018-03-20 DIAGNOSIS — N764 Abscess of vulva: Secondary | ICD-10-CM | POA: Diagnosis not present

## 2018-03-20 DIAGNOSIS — D509 Iron deficiency anemia, unspecified: Secondary | ICD-10-CM | POA: Diagnosis not present

## 2018-03-20 DIAGNOSIS — K219 Gastro-esophageal reflux disease without esophagitis: Secondary | ICD-10-CM | POA: Diagnosis not present

## 2018-03-20 DIAGNOSIS — E876 Hypokalemia: Secondary | ICD-10-CM | POA: Diagnosis not present

## 2018-03-20 DIAGNOSIS — G8929 Other chronic pain: Secondary | ICD-10-CM | POA: Diagnosis not present

## 2018-03-20 DIAGNOSIS — Z87891 Personal history of nicotine dependence: Secondary | ICD-10-CM | POA: Diagnosis not present

## 2018-03-20 DIAGNOSIS — Z6841 Body Mass Index (BMI) 40.0 and over, adult: Secondary | ICD-10-CM | POA: Diagnosis not present

## 2018-03-20 DIAGNOSIS — M6281 Muscle weakness (generalized): Secondary | ICD-10-CM | POA: Diagnosis not present

## 2018-03-20 DIAGNOSIS — G8918 Other acute postprocedural pain: Secondary | ICD-10-CM | POA: Diagnosis not present

## 2018-03-20 DIAGNOSIS — Z471 Aftercare following joint replacement surgery: Secondary | ICD-10-CM | POA: Diagnosis not present

## 2018-03-20 DIAGNOSIS — M25551 Pain in right hip: Secondary | ICD-10-CM | POA: Diagnosis not present

## 2018-03-20 DIAGNOSIS — R112 Nausea with vomiting, unspecified: Secondary | ICD-10-CM | POA: Diagnosis not present

## 2018-03-20 DIAGNOSIS — R2681 Unsteadiness on feet: Secondary | ICD-10-CM | POA: Diagnosis not present

## 2018-03-20 DIAGNOSIS — G9009 Other idiopathic peripheral autonomic neuropathy: Secondary | ICD-10-CM | POA: Diagnosis not present

## 2018-03-20 DIAGNOSIS — Z96641 Presence of right artificial hip joint: Secondary | ICD-10-CM | POA: Diagnosis not present

## 2018-03-21 DIAGNOSIS — M1611 Unilateral primary osteoarthritis, right hip: Secondary | ICD-10-CM | POA: Diagnosis not present

## 2018-03-21 DIAGNOSIS — G8918 Other acute postprocedural pain: Secondary | ICD-10-CM | POA: Diagnosis not present

## 2018-03-23 DIAGNOSIS — R609 Edema, unspecified: Secondary | ICD-10-CM | POA: Diagnosis not present

## 2018-03-23 DIAGNOSIS — F4322 Adjustment disorder with anxiety: Secondary | ICD-10-CM | POA: Diagnosis not present

## 2018-03-23 DIAGNOSIS — Z888 Allergy status to other drugs, medicaments and biological substances status: Secondary | ICD-10-CM | POA: Diagnosis not present

## 2018-03-23 DIAGNOSIS — Z743 Need for continuous supervision: Secondary | ICD-10-CM | POA: Diagnosis not present

## 2018-03-23 DIAGNOSIS — E876 Hypokalemia: Secondary | ICD-10-CM | POA: Diagnosis not present

## 2018-03-23 DIAGNOSIS — Z885 Allergy status to narcotic agent status: Secondary | ICD-10-CM | POA: Diagnosis not present

## 2018-03-23 DIAGNOSIS — Z471 Aftercare following joint replacement surgery: Secondary | ICD-10-CM | POA: Diagnosis not present

## 2018-03-23 DIAGNOSIS — R2681 Unsteadiness on feet: Secondary | ICD-10-CM | POA: Diagnosis not present

## 2018-03-23 DIAGNOSIS — R112 Nausea with vomiting, unspecified: Secondary | ICD-10-CM | POA: Diagnosis not present

## 2018-03-23 DIAGNOSIS — M25551 Pain in right hip: Secondary | ICD-10-CM | POA: Diagnosis not present

## 2018-03-23 DIAGNOSIS — M6281 Muscle weakness (generalized): Secondary | ICD-10-CM | POA: Diagnosis not present

## 2018-03-23 DIAGNOSIS — K219 Gastro-esophageal reflux disease without esophagitis: Secondary | ICD-10-CM | POA: Diagnosis not present

## 2018-03-23 DIAGNOSIS — M1611 Unilateral primary osteoarthritis, right hip: Secondary | ICD-10-CM | POA: Diagnosis not present

## 2018-03-23 DIAGNOSIS — G9009 Other idiopathic peripheral autonomic neuropathy: Secondary | ICD-10-CM | POA: Diagnosis not present

## 2018-03-23 DIAGNOSIS — Z91041 Radiographic dye allergy status: Secondary | ICD-10-CM | POA: Diagnosis not present

## 2018-03-23 DIAGNOSIS — R2689 Other abnormalities of gait and mobility: Secondary | ICD-10-CM | POA: Diagnosis not present

## 2018-03-23 DIAGNOSIS — Z96641 Presence of right artificial hip joint: Secondary | ICD-10-CM | POA: Diagnosis not present

## 2018-03-23 DIAGNOSIS — I10 Essential (primary) hypertension: Secondary | ICD-10-CM | POA: Diagnosis not present

## 2018-03-26 DIAGNOSIS — R112 Nausea with vomiting, unspecified: Secondary | ICD-10-CM | POA: Diagnosis not present

## 2018-03-26 DIAGNOSIS — K219 Gastro-esophageal reflux disease without esophagitis: Secondary | ICD-10-CM | POA: Diagnosis not present

## 2018-03-26 DIAGNOSIS — M1611 Unilateral primary osteoarthritis, right hip: Secondary | ICD-10-CM | POA: Diagnosis not present

## 2018-03-26 DIAGNOSIS — M6281 Muscle weakness (generalized): Secondary | ICD-10-CM | POA: Diagnosis not present

## 2018-03-26 DIAGNOSIS — E876 Hypokalemia: Secondary | ICD-10-CM | POA: Diagnosis not present

## 2018-03-26 DIAGNOSIS — I10 Essential (primary) hypertension: Secondary | ICD-10-CM | POA: Diagnosis not present

## 2018-03-26 DIAGNOSIS — Z96641 Presence of right artificial hip joint: Secondary | ICD-10-CM | POA: Diagnosis not present

## 2018-03-26 DIAGNOSIS — R2689 Other abnormalities of gait and mobility: Secondary | ICD-10-CM | POA: Diagnosis not present

## 2018-03-26 DIAGNOSIS — R609 Edema, unspecified: Secondary | ICD-10-CM | POA: Diagnosis not present

## 2018-03-28 DIAGNOSIS — R112 Nausea with vomiting, unspecified: Secondary | ICD-10-CM | POA: Diagnosis not present

## 2018-03-28 DIAGNOSIS — E876 Hypokalemia: Secondary | ICD-10-CM | POA: Diagnosis not present

## 2018-03-28 DIAGNOSIS — M1611 Unilateral primary osteoarthritis, right hip: Secondary | ICD-10-CM | POA: Diagnosis not present

## 2018-03-28 DIAGNOSIS — Z96641 Presence of right artificial hip joint: Secondary | ICD-10-CM | POA: Diagnosis not present

## 2018-03-28 DIAGNOSIS — I10 Essential (primary) hypertension: Secondary | ICD-10-CM | POA: Diagnosis not present

## 2018-03-28 DIAGNOSIS — R609 Edema, unspecified: Secondary | ICD-10-CM | POA: Diagnosis not present

## 2018-03-28 DIAGNOSIS — K219 Gastro-esophageal reflux disease without esophagitis: Secondary | ICD-10-CM | POA: Diagnosis not present

## 2018-03-28 DIAGNOSIS — M6281 Muscle weakness (generalized): Secondary | ICD-10-CM | POA: Diagnosis not present

## 2018-03-28 DIAGNOSIS — R2689 Other abnormalities of gait and mobility: Secondary | ICD-10-CM | POA: Diagnosis not present

## 2018-04-02 DIAGNOSIS — M1611 Unilateral primary osteoarthritis, right hip: Secondary | ICD-10-CM | POA: Diagnosis not present

## 2018-04-02 DIAGNOSIS — R609 Edema, unspecified: Secondary | ICD-10-CM | POA: Diagnosis not present

## 2018-04-02 DIAGNOSIS — E876 Hypokalemia: Secondary | ICD-10-CM | POA: Diagnosis not present

## 2018-04-02 DIAGNOSIS — R112 Nausea with vomiting, unspecified: Secondary | ICD-10-CM | POA: Diagnosis not present

## 2018-04-02 DIAGNOSIS — R2689 Other abnormalities of gait and mobility: Secondary | ICD-10-CM | POA: Diagnosis not present

## 2018-04-02 DIAGNOSIS — K219 Gastro-esophageal reflux disease without esophagitis: Secondary | ICD-10-CM | POA: Diagnosis not present

## 2018-04-02 DIAGNOSIS — M6281 Muscle weakness (generalized): Secondary | ICD-10-CM | POA: Diagnosis not present

## 2018-04-02 DIAGNOSIS — Z96641 Presence of right artificial hip joint: Secondary | ICD-10-CM | POA: Diagnosis not present

## 2018-04-02 DIAGNOSIS — I10 Essential (primary) hypertension: Secondary | ICD-10-CM | POA: Diagnosis not present

## 2018-04-05 DIAGNOSIS — Z471 Aftercare following joint replacement surgery: Secondary | ICD-10-CM | POA: Diagnosis not present

## 2018-04-05 DIAGNOSIS — Z91041 Radiographic dye allergy status: Secondary | ICD-10-CM | POA: Diagnosis not present

## 2018-04-05 DIAGNOSIS — Z888 Allergy status to other drugs, medicaments and biological substances status: Secondary | ICD-10-CM | POA: Diagnosis not present

## 2018-04-05 DIAGNOSIS — Z96641 Presence of right artificial hip joint: Secondary | ICD-10-CM | POA: Diagnosis not present

## 2018-04-05 DIAGNOSIS — Z885 Allergy status to narcotic agent status: Secondary | ICD-10-CM | POA: Diagnosis not present

## 2018-04-07 DIAGNOSIS — G9009 Other idiopathic peripheral autonomic neuropathy: Secondary | ICD-10-CM | POA: Diagnosis not present

## 2018-04-07 DIAGNOSIS — I10 Essential (primary) hypertension: Secondary | ICD-10-CM | POA: Diagnosis not present

## 2018-04-07 DIAGNOSIS — K219 Gastro-esophageal reflux disease without esophagitis: Secondary | ICD-10-CM | POA: Diagnosis not present

## 2018-04-07 DIAGNOSIS — M1990 Unspecified osteoarthritis, unspecified site: Secondary | ICD-10-CM | POA: Diagnosis not present

## 2018-04-07 DIAGNOSIS — K5900 Constipation, unspecified: Secondary | ICD-10-CM | POA: Diagnosis not present

## 2018-04-07 DIAGNOSIS — Z471 Aftercare following joint replacement surgery: Secondary | ICD-10-CM | POA: Diagnosis not present

## 2018-04-07 DIAGNOSIS — E559 Vitamin D deficiency, unspecified: Secondary | ICD-10-CM | POA: Diagnosis not present

## 2018-04-07 DIAGNOSIS — Z6841 Body Mass Index (BMI) 40.0 and over, adult: Secondary | ICD-10-CM | POA: Diagnosis not present

## 2018-04-09 ENCOUNTER — Ambulatory Visit: Payer: Medicare HMO | Admitting: Physician Assistant

## 2018-04-10 DIAGNOSIS — Z471 Aftercare following joint replacement surgery: Secondary | ICD-10-CM | POA: Diagnosis not present

## 2018-04-10 DIAGNOSIS — I10 Essential (primary) hypertension: Secondary | ICD-10-CM | POA: Diagnosis not present

## 2018-04-10 DIAGNOSIS — M1990 Unspecified osteoarthritis, unspecified site: Secondary | ICD-10-CM | POA: Diagnosis not present

## 2018-04-10 DIAGNOSIS — K219 Gastro-esophageal reflux disease without esophagitis: Secondary | ICD-10-CM | POA: Diagnosis not present

## 2018-04-10 DIAGNOSIS — K5900 Constipation, unspecified: Secondary | ICD-10-CM | POA: Diagnosis not present

## 2018-04-10 DIAGNOSIS — E559 Vitamin D deficiency, unspecified: Secondary | ICD-10-CM | POA: Diagnosis not present

## 2018-04-10 DIAGNOSIS — G9009 Other idiopathic peripheral autonomic neuropathy: Secondary | ICD-10-CM | POA: Diagnosis not present

## 2018-04-10 DIAGNOSIS — Z6841 Body Mass Index (BMI) 40.0 and over, adult: Secondary | ICD-10-CM | POA: Diagnosis not present

## 2018-04-12 DIAGNOSIS — G9009 Other idiopathic peripheral autonomic neuropathy: Secondary | ICD-10-CM | POA: Diagnosis not present

## 2018-04-12 DIAGNOSIS — M1990 Unspecified osteoarthritis, unspecified site: Secondary | ICD-10-CM | POA: Diagnosis not present

## 2018-04-12 DIAGNOSIS — I10 Essential (primary) hypertension: Secondary | ICD-10-CM | POA: Diagnosis not present

## 2018-04-12 DIAGNOSIS — K5900 Constipation, unspecified: Secondary | ICD-10-CM | POA: Diagnosis not present

## 2018-04-12 DIAGNOSIS — Z471 Aftercare following joint replacement surgery: Secondary | ICD-10-CM | POA: Diagnosis not present

## 2018-04-12 DIAGNOSIS — Z6841 Body Mass Index (BMI) 40.0 and over, adult: Secondary | ICD-10-CM | POA: Diagnosis not present

## 2018-04-12 DIAGNOSIS — E559 Vitamin D deficiency, unspecified: Secondary | ICD-10-CM | POA: Diagnosis not present

## 2018-04-12 DIAGNOSIS — K219 Gastro-esophageal reflux disease without esophagitis: Secondary | ICD-10-CM | POA: Diagnosis not present

## 2018-04-15 DIAGNOSIS — Z91041 Radiographic dye allergy status: Secondary | ICD-10-CM | POA: Diagnosis not present

## 2018-04-15 DIAGNOSIS — K5900 Constipation, unspecified: Secondary | ICD-10-CM | POA: Diagnosis not present

## 2018-04-15 DIAGNOSIS — M1990 Unspecified osteoarthritis, unspecified site: Secondary | ICD-10-CM | POA: Diagnosis not present

## 2018-04-15 DIAGNOSIS — Z885 Allergy status to narcotic agent status: Secondary | ICD-10-CM | POA: Diagnosis not present

## 2018-04-15 DIAGNOSIS — T8131XA Disruption of external operation (surgical) wound, not elsewhere classified, initial encounter: Secondary | ICD-10-CM | POA: Diagnosis not present

## 2018-04-15 DIAGNOSIS — I10 Essential (primary) hypertension: Secondary | ICD-10-CM | POA: Diagnosis not present

## 2018-04-15 DIAGNOSIS — K219 Gastro-esophageal reflux disease without esophagitis: Secondary | ICD-10-CM | POA: Diagnosis not present

## 2018-04-15 DIAGNOSIS — Z6841 Body Mass Index (BMI) 40.0 and over, adult: Secondary | ICD-10-CM | POA: Diagnosis not present

## 2018-04-15 DIAGNOSIS — Z96641 Presence of right artificial hip joint: Secondary | ICD-10-CM | POA: Diagnosis not present

## 2018-04-15 DIAGNOSIS — M7989 Other specified soft tissue disorders: Secondary | ICD-10-CM | POA: Diagnosis not present

## 2018-04-15 DIAGNOSIS — E559 Vitamin D deficiency, unspecified: Secondary | ICD-10-CM | POA: Diagnosis not present

## 2018-04-15 DIAGNOSIS — Z471 Aftercare following joint replacement surgery: Secondary | ICD-10-CM | POA: Diagnosis not present

## 2018-04-15 DIAGNOSIS — Z888 Allergy status to other drugs, medicaments and biological substances status: Secondary | ICD-10-CM | POA: Diagnosis not present

## 2018-04-15 DIAGNOSIS — G9009 Other idiopathic peripheral autonomic neuropathy: Secondary | ICD-10-CM | POA: Diagnosis not present

## 2018-04-16 ENCOUNTER — Encounter: Payer: Self-pay | Admitting: Physician Assistant

## 2018-04-17 DIAGNOSIS — M1990 Unspecified osteoarthritis, unspecified site: Secondary | ICD-10-CM | POA: Diagnosis not present

## 2018-04-17 DIAGNOSIS — Z6841 Body Mass Index (BMI) 40.0 and over, adult: Secondary | ICD-10-CM | POA: Diagnosis not present

## 2018-04-17 DIAGNOSIS — K219 Gastro-esophageal reflux disease without esophagitis: Secondary | ICD-10-CM | POA: Diagnosis not present

## 2018-04-17 DIAGNOSIS — I10 Essential (primary) hypertension: Secondary | ICD-10-CM | POA: Diagnosis not present

## 2018-04-17 DIAGNOSIS — K5900 Constipation, unspecified: Secondary | ICD-10-CM | POA: Diagnosis not present

## 2018-04-17 DIAGNOSIS — G9009 Other idiopathic peripheral autonomic neuropathy: Secondary | ICD-10-CM | POA: Diagnosis not present

## 2018-04-17 DIAGNOSIS — Z471 Aftercare following joint replacement surgery: Secondary | ICD-10-CM | POA: Diagnosis not present

## 2018-04-17 DIAGNOSIS — E559 Vitamin D deficiency, unspecified: Secondary | ICD-10-CM | POA: Diagnosis not present

## 2018-04-18 DIAGNOSIS — M1990 Unspecified osteoarthritis, unspecified site: Secondary | ICD-10-CM | POA: Diagnosis not present

## 2018-04-18 DIAGNOSIS — K219 Gastro-esophageal reflux disease without esophagitis: Secondary | ICD-10-CM | POA: Diagnosis not present

## 2018-04-18 DIAGNOSIS — Z471 Aftercare following joint replacement surgery: Secondary | ICD-10-CM | POA: Diagnosis not present

## 2018-04-18 DIAGNOSIS — E559 Vitamin D deficiency, unspecified: Secondary | ICD-10-CM | POA: Diagnosis not present

## 2018-04-18 DIAGNOSIS — Z6841 Body Mass Index (BMI) 40.0 and over, adult: Secondary | ICD-10-CM | POA: Diagnosis not present

## 2018-04-18 DIAGNOSIS — G9009 Other idiopathic peripheral autonomic neuropathy: Secondary | ICD-10-CM | POA: Diagnosis not present

## 2018-04-18 DIAGNOSIS — I10 Essential (primary) hypertension: Secondary | ICD-10-CM | POA: Diagnosis not present

## 2018-04-18 DIAGNOSIS — K5900 Constipation, unspecified: Secondary | ICD-10-CM | POA: Diagnosis not present

## 2018-04-19 DIAGNOSIS — Z6841 Body Mass Index (BMI) 40.0 and over, adult: Secondary | ICD-10-CM | POA: Diagnosis not present

## 2018-04-19 DIAGNOSIS — K219 Gastro-esophageal reflux disease without esophagitis: Secondary | ICD-10-CM | POA: Diagnosis not present

## 2018-04-19 DIAGNOSIS — I10 Essential (primary) hypertension: Secondary | ICD-10-CM | POA: Diagnosis not present

## 2018-04-19 DIAGNOSIS — G9009 Other idiopathic peripheral autonomic neuropathy: Secondary | ICD-10-CM | POA: Diagnosis not present

## 2018-04-19 DIAGNOSIS — Z471 Aftercare following joint replacement surgery: Secondary | ICD-10-CM | POA: Diagnosis not present

## 2018-04-19 DIAGNOSIS — K5900 Constipation, unspecified: Secondary | ICD-10-CM | POA: Diagnosis not present

## 2018-04-19 DIAGNOSIS — E559 Vitamin D deficiency, unspecified: Secondary | ICD-10-CM | POA: Diagnosis not present

## 2018-04-19 DIAGNOSIS — M1990 Unspecified osteoarthritis, unspecified site: Secondary | ICD-10-CM | POA: Diagnosis not present

## 2018-04-22 DIAGNOSIS — Z96641 Presence of right artificial hip joint: Secondary | ICD-10-CM | POA: Diagnosis not present

## 2018-04-22 DIAGNOSIS — K5900 Constipation, unspecified: Secondary | ICD-10-CM | POA: Diagnosis not present

## 2018-04-22 DIAGNOSIS — I10 Essential (primary) hypertension: Secondary | ICD-10-CM | POA: Diagnosis not present

## 2018-04-22 DIAGNOSIS — M1990 Unspecified osteoarthritis, unspecified site: Secondary | ICD-10-CM | POA: Diagnosis not present

## 2018-04-22 DIAGNOSIS — K219 Gastro-esophageal reflux disease without esophagitis: Secondary | ICD-10-CM | POA: Diagnosis not present

## 2018-04-22 DIAGNOSIS — Z471 Aftercare following joint replacement surgery: Secondary | ICD-10-CM | POA: Diagnosis not present

## 2018-04-22 DIAGNOSIS — E559 Vitamin D deficiency, unspecified: Secondary | ICD-10-CM | POA: Diagnosis not present

## 2018-04-22 DIAGNOSIS — Z6841 Body Mass Index (BMI) 40.0 and over, adult: Secondary | ICD-10-CM | POA: Diagnosis not present

## 2018-04-22 DIAGNOSIS — G9009 Other idiopathic peripheral autonomic neuropathy: Secondary | ICD-10-CM | POA: Diagnosis not present

## 2018-04-24 DIAGNOSIS — G9009 Other idiopathic peripheral autonomic neuropathy: Secondary | ICD-10-CM | POA: Diagnosis not present

## 2018-04-24 DIAGNOSIS — E559 Vitamin D deficiency, unspecified: Secondary | ICD-10-CM | POA: Diagnosis not present

## 2018-04-24 DIAGNOSIS — K219 Gastro-esophageal reflux disease without esophagitis: Secondary | ICD-10-CM | POA: Diagnosis not present

## 2018-04-24 DIAGNOSIS — I10 Essential (primary) hypertension: Secondary | ICD-10-CM | POA: Diagnosis not present

## 2018-04-24 DIAGNOSIS — Z6841 Body Mass Index (BMI) 40.0 and over, adult: Secondary | ICD-10-CM | POA: Diagnosis not present

## 2018-04-24 DIAGNOSIS — Z471 Aftercare following joint replacement surgery: Secondary | ICD-10-CM | POA: Diagnosis not present

## 2018-04-24 DIAGNOSIS — K5900 Constipation, unspecified: Secondary | ICD-10-CM | POA: Diagnosis not present

## 2018-04-24 DIAGNOSIS — M1990 Unspecified osteoarthritis, unspecified site: Secondary | ICD-10-CM | POA: Diagnosis not present

## 2018-04-26 DIAGNOSIS — E559 Vitamin D deficiency, unspecified: Secondary | ICD-10-CM | POA: Diagnosis not present

## 2018-04-26 DIAGNOSIS — Z6841 Body Mass Index (BMI) 40.0 and over, adult: Secondary | ICD-10-CM | POA: Diagnosis not present

## 2018-04-26 DIAGNOSIS — K219 Gastro-esophageal reflux disease without esophagitis: Secondary | ICD-10-CM | POA: Diagnosis not present

## 2018-04-26 DIAGNOSIS — G9009 Other idiopathic peripheral autonomic neuropathy: Secondary | ICD-10-CM | POA: Diagnosis not present

## 2018-04-26 DIAGNOSIS — I10 Essential (primary) hypertension: Secondary | ICD-10-CM | POA: Diagnosis not present

## 2018-04-26 DIAGNOSIS — K5900 Constipation, unspecified: Secondary | ICD-10-CM | POA: Diagnosis not present

## 2018-04-26 DIAGNOSIS — M1990 Unspecified osteoarthritis, unspecified site: Secondary | ICD-10-CM | POA: Diagnosis not present

## 2018-04-26 DIAGNOSIS — Z471 Aftercare following joint replacement surgery: Secondary | ICD-10-CM | POA: Diagnosis not present

## 2018-05-01 DIAGNOSIS — G9009 Other idiopathic peripheral autonomic neuropathy: Secondary | ICD-10-CM | POA: Diagnosis not present

## 2018-05-01 DIAGNOSIS — Z6841 Body Mass Index (BMI) 40.0 and over, adult: Secondary | ICD-10-CM | POA: Diagnosis not present

## 2018-05-01 DIAGNOSIS — K5900 Constipation, unspecified: Secondary | ICD-10-CM | POA: Diagnosis not present

## 2018-05-01 DIAGNOSIS — K219 Gastro-esophageal reflux disease without esophagitis: Secondary | ICD-10-CM | POA: Diagnosis not present

## 2018-05-01 DIAGNOSIS — M1990 Unspecified osteoarthritis, unspecified site: Secondary | ICD-10-CM | POA: Diagnosis not present

## 2018-05-01 DIAGNOSIS — I10 Essential (primary) hypertension: Secondary | ICD-10-CM | POA: Diagnosis not present

## 2018-05-01 DIAGNOSIS — E559 Vitamin D deficiency, unspecified: Secondary | ICD-10-CM | POA: Diagnosis not present

## 2018-05-01 DIAGNOSIS — Z471 Aftercare following joint replacement surgery: Secondary | ICD-10-CM | POA: Diagnosis not present

## 2018-05-03 DIAGNOSIS — Z6841 Body Mass Index (BMI) 40.0 and over, adult: Secondary | ICD-10-CM | POA: Diagnosis not present

## 2018-05-03 DIAGNOSIS — M1990 Unspecified osteoarthritis, unspecified site: Secondary | ICD-10-CM | POA: Diagnosis not present

## 2018-05-03 DIAGNOSIS — Z471 Aftercare following joint replacement surgery: Secondary | ICD-10-CM | POA: Diagnosis not present

## 2018-05-03 DIAGNOSIS — I10 Essential (primary) hypertension: Secondary | ICD-10-CM | POA: Diagnosis not present

## 2018-05-03 DIAGNOSIS — E559 Vitamin D deficiency, unspecified: Secondary | ICD-10-CM | POA: Diagnosis not present

## 2018-05-03 DIAGNOSIS — K219 Gastro-esophageal reflux disease without esophagitis: Secondary | ICD-10-CM | POA: Diagnosis not present

## 2018-05-03 DIAGNOSIS — G9009 Other idiopathic peripheral autonomic neuropathy: Secondary | ICD-10-CM | POA: Diagnosis not present

## 2018-05-03 DIAGNOSIS — K5900 Constipation, unspecified: Secondary | ICD-10-CM | POA: Diagnosis not present

## 2018-05-06 DIAGNOSIS — Z96641 Presence of right artificial hip joint: Secondary | ICD-10-CM | POA: Diagnosis not present

## 2018-05-06 DIAGNOSIS — Z471 Aftercare following joint replacement surgery: Secondary | ICD-10-CM | POA: Diagnosis not present

## 2018-05-07 DIAGNOSIS — Z471 Aftercare following joint replacement surgery: Secondary | ICD-10-CM | POA: Diagnosis not present

## 2018-05-07 DIAGNOSIS — M1990 Unspecified osteoarthritis, unspecified site: Secondary | ICD-10-CM | POA: Diagnosis not present

## 2018-05-07 DIAGNOSIS — K219 Gastro-esophageal reflux disease without esophagitis: Secondary | ICD-10-CM | POA: Diagnosis not present

## 2018-05-07 DIAGNOSIS — E559 Vitamin D deficiency, unspecified: Secondary | ICD-10-CM | POA: Diagnosis not present

## 2018-05-07 DIAGNOSIS — K5900 Constipation, unspecified: Secondary | ICD-10-CM | POA: Diagnosis not present

## 2018-05-07 DIAGNOSIS — G9009 Other idiopathic peripheral autonomic neuropathy: Secondary | ICD-10-CM | POA: Diagnosis not present

## 2018-05-07 DIAGNOSIS — I10 Essential (primary) hypertension: Secondary | ICD-10-CM | POA: Diagnosis not present

## 2018-05-07 DIAGNOSIS — Z6841 Body Mass Index (BMI) 40.0 and over, adult: Secondary | ICD-10-CM | POA: Diagnosis not present

## 2018-05-09 DIAGNOSIS — Z6841 Body Mass Index (BMI) 40.0 and over, adult: Secondary | ICD-10-CM | POA: Diagnosis not present

## 2018-05-09 DIAGNOSIS — I10 Essential (primary) hypertension: Secondary | ICD-10-CM | POA: Diagnosis not present

## 2018-05-09 DIAGNOSIS — Z471 Aftercare following joint replacement surgery: Secondary | ICD-10-CM | POA: Diagnosis not present

## 2018-05-09 DIAGNOSIS — M1990 Unspecified osteoarthritis, unspecified site: Secondary | ICD-10-CM | POA: Diagnosis not present

## 2018-05-09 DIAGNOSIS — K5900 Constipation, unspecified: Secondary | ICD-10-CM | POA: Diagnosis not present

## 2018-05-09 DIAGNOSIS — E559 Vitamin D deficiency, unspecified: Secondary | ICD-10-CM | POA: Diagnosis not present

## 2018-05-09 DIAGNOSIS — K219 Gastro-esophageal reflux disease without esophagitis: Secondary | ICD-10-CM | POA: Diagnosis not present

## 2018-05-09 DIAGNOSIS — G9009 Other idiopathic peripheral autonomic neuropathy: Secondary | ICD-10-CM | POA: Diagnosis not present

## 2018-06-14 ENCOUNTER — Ambulatory Visit (INDEPENDENT_AMBULATORY_CARE_PROVIDER_SITE_OTHER): Payer: Medicare HMO | Admitting: Physician Assistant

## 2018-06-14 ENCOUNTER — Encounter: Payer: Self-pay | Admitting: Physician Assistant

## 2018-06-14 VITALS — BP 144/70 | HR 66 | Temp 96.5°F | Ht 63.0 in | Wt 198.4 lb

## 2018-06-14 DIAGNOSIS — I10 Essential (primary) hypertension: Secondary | ICD-10-CM | POA: Diagnosis not present

## 2018-06-14 DIAGNOSIS — M159 Polyosteoarthritis, unspecified: Secondary | ICD-10-CM

## 2018-06-14 DIAGNOSIS — M150 Primary generalized (osteo)arthritis: Secondary | ICD-10-CM | POA: Diagnosis not present

## 2018-06-14 DIAGNOSIS — Z0000 Encounter for general adult medical examination without abnormal findings: Secondary | ICD-10-CM | POA: Diagnosis not present

## 2018-06-14 MED ORDER — TRAMADOL HCL 50 MG PO TABS: 100.0000 mg | ORAL_TABLET | Freq: Four times a day (QID) | ORAL | 5 refills | Status: DC | PRN

## 2018-06-14 MED ORDER — LORATADINE 10 MG PO TABS: 10.0000 mg | ORAL_TABLET | Freq: Every day | ORAL | 11 refills | Status: DC

## 2018-06-14 MED ORDER — FLUCONAZOLE 150 MG PO TABS: ORAL_TABLET | ORAL | 0 refills | Status: DC

## 2018-06-14 MED ORDER — GABAPENTIN 100 MG PO CAPS: 100.0000 mg | ORAL_CAPSULE | Freq: Three times a day (TID) | ORAL | 5 refills | Status: DC

## 2018-06-15 LAB — CBC WITH DIFFERENTIAL/PLATELET
BASOS ABS: 0 10*3/uL (ref 0.0–0.2)
BASOS: 1 %
EOS (ABSOLUTE): 0.3 10*3/uL (ref 0.0–0.4)
Eos: 3 %
HEMOGLOBIN: 11.1 g/dL (ref 11.1–15.9)
Hematocrit: 35.3 % (ref 34.0–46.6)
IMMATURE GRANS (ABS): 0 10*3/uL (ref 0.0–0.1)
Immature Granulocytes: 0 %
LYMPHS ABS: 2.2 10*3/uL (ref 0.7–3.1)
Lymphs: 28 %
MCH: 25.9 pg — AB (ref 26.6–33.0)
MCHC: 31.4 g/dL — AB (ref 31.5–35.7)
MCV: 82 fL (ref 79–97)
MONOCYTES: 6 %
Monocytes Absolute: 0.5 10*3/uL (ref 0.1–0.9)
NEUTROS ABS: 4.8 10*3/uL (ref 1.4–7.0)
Neutrophils: 62 %
Platelets: 324 10*3/uL (ref 150–450)
RBC: 4.29 x10E6/uL (ref 3.77–5.28)
RDW: 12.7 % (ref 11.7–15.4)
WBC: 7.8 10*3/uL (ref 3.4–10.8)

## 2018-06-15 LAB — CMP14+EGFR
A/G RATIO: 1.8 (ref 1.2–2.2)
ALBUMIN: 4.1 g/dL (ref 3.7–4.7)
ALK PHOS: 74 IU/L (ref 39–117)
ALT: 7 IU/L (ref 0–32)
AST: 10 IU/L (ref 0–40)
BILIRUBIN TOTAL: 0.2 mg/dL (ref 0.0–1.2)
BUN / CREAT RATIO: 24 (ref 12–28)
BUN: 18 mg/dL (ref 8–27)
CHLORIDE: 105 mmol/L (ref 96–106)
CO2: 22 mmol/L (ref 20–29)
Calcium: 9.9 mg/dL (ref 8.7–10.3)
Creatinine, Ser: 0.74 mg/dL (ref 0.57–1.00)
GFR calc non Af Amer: 78 mL/min/{1.73_m2} (ref >59)
GFR, EST AFRICAN AMERICAN: 90 mL/min/{1.73_m2} (ref >59)
GLOBULIN, TOTAL: 2.3 g/dL (ref 1.5–4.5)
GLUCOSE: 87 mg/dL (ref 65–99)
POTASSIUM: 4.1 mmol/L (ref 3.5–5.2)
SODIUM: 142 mmol/L (ref 134–144)
TOTAL PROTEIN: 6.4 g/dL (ref 6.0–8.5)

## 2018-06-15 LAB — LIPID PANEL
CHOLESTEROL TOTAL: 207 mg/dL — AB (ref 100–199)
Chol/HDL Ratio: 2.5 ratio (ref 0.0–4.4)
HDL: 83 mg/dL (ref >39)
LDL Calculated: 109 mg/dL — ABNORMAL HIGH (ref 0–99)
Triglycerides: 76 mg/dL (ref 0–149)
VLDL CHOLESTEROL CAL: 15 mg/dL (ref 5–40)

## 2018-06-15 LAB — TSH: TSH: 0.368 u[IU]/mL — AB (ref 0.450–4.500)

## 2018-06-17 NOTE — Progress Notes (Signed)
BP (!) 144/70   Pulse 66   Temp (!) 96.5 F (35.8 C) (Oral)   Ht 5' 3" (1.6 m)   Wt 198 lb 6.4 oz (90 kg)   BMI 35.14 kg/m    Subjective:    Patient ID: Melissa Zavala, female    DOB: March 14, 1941, 78 y.o.   MRN: [id number]  HPI: Melissa Zavala is a 78 y.o. female presenting on 06/14/2018 for Hypertension (3 month ); Vaginitis; and Abdominal Pain (upper)  This patient comes in for periodic recheck on medications and conditions including OA, hypertension, She has been stable and no new complaints. Has handled her hip replacement very well.  All medications are reviewed today. There are no reports of any problems with the medications. All of the medical conditions are reviewed and updated.  Lab work is reviewed and will be ordered as medically necessary. There are no new problems reported with today's visit.   Past Medical History:  Diagnosis Date  . Arthritis   . Hypertension    Relevant past medical, surgical, family and social history reviewed and updated as indicated. Interim medical history since our last visit reviewed. Allergies and medications reviewed and updated. DATA REVIEWED: CHART IN EPIC  Family History reviewed for pertinent findings.  Review of Systems  Constitutional: Negative.   HENT: Negative.   Eyes: Negative.   Respiratory: Negative.   Gastrointestinal: Negative.   Genitourinary: Negative.     Allergies as of 06/14/2018      Reactions   Iodinated Diagnostic Agents Anaphylaxis   Codeine Other (See Comments)   Hallucinations   Diphenhydramine Other (See Comments)   Patient states "feels like somebody is inside of me clawing me out"      Medication List       Accurate as of June 14, 2018 11:59 PM. Always use your most recent med list.        amLODipine 10 MG tablet Commonly known as:  NORVASC Take 1 tablet (10 mg total) by mouth daily.   celecoxib 200 MG capsule Commonly known as:  CELEBREX Take 1 capsule (200 mg total) by mouth  daily.   conjugated estrogens vaginal cream Commonly known as:  PREMARIN Place 1 Applicatorful vaginally daily.   esomeprazole 40 MG capsule Commonly known as:  NEXIUM TAKE (1) CAPSULE DAILY.   fluconazole 150 MG tablet Commonly known as:  DIFLUCAN 1 po q week x 4 weeks   furosemide 20 MG tablet Commonly known as:  LASIX TAKE ONE TABLET DAILY. WHEN LOTS OF SWELLING.   gabapentin 100 MG capsule Commonly known as:  NEURONTIN Take 1 capsule (100 mg total) by mouth 3 (three) times daily.   lisinopril 30 MG tablet Commonly known as:  PRINIVIL,ZESTRIL Take 1 tablet (30 mg total) by mouth daily.   loratadine 10 MG tablet Commonly known as:  CLARITIN Take 1 tablet (10 mg total) by mouth daily.   nystatin powder Commonly known as:  MYCOSTATIN/NYSTOP Apply topically 4 (four) times daily.   potassium chloride 10 MEQ tablet Commonly known as:  K-DUR Take 1 tablet (10 mEq total) by mouth daily.   traMADol 50 MG tablet Commonly known as:  ULTRAM Take 2 tablets (100 mg total) by mouth every 6 (six) hours as needed.          Objective:    BP (!) 144/70   Pulse 66   Temp (!) 96.5 F (35.8 C) (Oral)   Ht 5' 3" (1.6 m)   Wt 198  lb 6.4 oz (90 kg)   BMI 35.14 kg/m   Allergies  Allergen Reactions  . Iodinated Diagnostic Agents Anaphylaxis  . Codeine Other (See Comments)    Hallucinations  . Diphenhydramine Other (See Comments)    Patient states "feels like somebody is inside of me clawing me out"    Wt Readings from Last 3 Encounters:  06/14/18 198 lb 6.4 oz (90 kg)  03/16/18 196 lb (88.9 kg)  03/12/18 194 lb 12.8 oz (88.4 kg)    Physical Exam Constitutional:      Appearance: She is well-developed.  HENT:     Head: Normocephalic and atraumatic.  Eyes:     Conjunctiva/sclera: Conjunctivae normal.     Pupils: Pupils are equal, round, and reactive to light.  Cardiovascular:     Rate and Rhythm: Normal rate and regular rhythm.     Heart sounds: Normal heart  sounds.  Pulmonary:     Effort: Pulmonary effort is normal.     Breath sounds: Normal breath sounds.  Abdominal:     General: Bowel sounds are normal.     Palpations: Abdomen is soft.  Skin:    General: Skin is warm and dry.     Findings: No rash.  Neurological:     Mental Status: She is alert and oriented to person, place, and time.     Deep Tendon Reflexes: Reflexes are normal and symmetric.  Psychiatric:        Behavior: Behavior normal.        Thought Content: Thought content normal.        Judgment: Judgment normal.     Results for orders placed or performed in visit on 06/14/18  CBC with Differential/Platelet  Result Value Ref Range   WBC 7.8 3.4 - 10.8 x10E3/uL   RBC 4.29 3.77 - 5.28 x10E6/uL   Hemoglobin 11.1 11.1 - 15.9 g/dL   Hematocrit 35.3 34.0 - 46.6 %   MCV 82 79 - 97 fL   MCH 25.9 (L) 26.6 - 33.0 pg   MCHC 31.4 (L) 31.5 - 35.7 g/dL   RDW 12.7 11.7 - 15.4 %   Platelets 324 150 - 450 x10E3/uL   Neutrophils 62 Not Estab. %   Lymphs 28 Not Estab. %   Monocytes 6 Not Estab. %   Eos 3 Not Estab. %   Basos 1 Not Estab. %   Neutrophils Absolute 4.8 1.4 - 7.0 x10E3/uL   Lymphocytes Absolute 2.2 0.7 - 3.1 x10E3/uL   Monocytes Absolute 0.5 0.1 - 0.9 x10E3/uL   EOS (ABSOLUTE) 0.3 0.0 - 0.4 x10E3/uL   Basophils Absolute 0.0 0.0 - 0.2 x10E3/uL   Immature Granulocytes 0 Not Estab. %   Immature Grans (Abs) 0.0 0.0 - 0.1 x10E3/uL  CMP14+EGFR  Result Value Ref Range   Glucose 87 65 - 99 mg/dL   BUN 18 8 - 27 mg/dL   Creatinine, Ser 0.74 0.57 - 1.00 mg/dL   GFR calc non Af Amer 78 >59 mL/min/1.73   GFR calc Af Amer 90 >59 mL/min/1.73   BUN/Creatinine Ratio 24 12 - 28   Sodium 142 134 - 144 mmol/L   Potassium 4.1 3.5 - 5.2 mmol/L   Chloride 105 96 - 106 mmol/L   CO2 22 20 - 29 mmol/L   Calcium 9.9 8.7 - 10.3 mg/dL   Total Protein 6.4 6.0 - 8.5 g/dL   Albumin 4.1 3.7 - 4.7 g/dL   Globulin, Total 2.3 1.5 - 4.5 g/dL   Albumin/Globulin Ratio  1.8 1.2 - 2.2    Bilirubin Total 0.2 0.0 - 1.2 mg/dL   Alkaline Phosphatase 74 39 - 117 IU/L   AST 10 0 - 40 IU/L   ALT 7 0 - 32 IU/L  Lipid panel  Result Value Ref Range   Cholesterol, Total 207 (H) 100 - 199 mg/dL   Triglycerides 76 0 - 149 mg/dL   HDL 83 >39 mg/dL   VLDL Cholesterol Cal 15 5 - 40 mg/dL   LDL Calculated 109 (H) 0 - 99 mg/dL   Chol/HDL Ratio 2.5 0.0 - 4.4 ratio  TSH  Result Value Ref Range   TSH 0.368 (L) 0.450 - 4.500 uIU/mL      Assessment & Plan:   1. Primary osteoarthritis involving multiple joints - traMADol (ULTRAM) 50 MG tablet; Take 2 tablets (100 mg total) by mouth every 6 (six) hours as needed.  Dispense: 120 tablet; Refill: 5  2. Generalized OA - gabapentin (NEURONTIN) 100 MG capsule; Take 1 capsule (100 mg total) by mouth 3 (three) times daily.  Dispense: 90 capsule; Refill: 5 - traMADol (ULTRAM) 50 MG tablet; Take 2 tablets (100 mg total) by mouth every 6 (six) hours as needed.  Dispense: 120 tablet; Refill: 5  3. Essential hypertension - CBC with Differential/Platelet - CMP14+EGFR - Lipid panel - TSH  4. Well adult exam - CBC with Differential/Platelet - CMP14+EGFR - Lipid panel - TSH   Continue all other maintenance medications as listed above.  Follow up plan: Return in about 6 months (around 12/13/2018).  Educational handout given for [place] PA-C [facility] [address]  [place], [place] [address] [phone]   06/17/2018, 5:20 PM

## 2018-07-02 ENCOUNTER — Encounter: Payer: Self-pay | Admitting: Nurse Practitioner

## 2018-07-02 ENCOUNTER — Ambulatory Visit (INDEPENDENT_AMBULATORY_CARE_PROVIDER_SITE_OTHER): Payer: Medicare HMO | Admitting: Nurse Practitioner

## 2018-07-02 VITALS — BP 134/77 | HR 83 | Temp 97.1°F | Ht 63.0 in | Wt 195.0 lb

## 2018-07-02 DIAGNOSIS — J069 Acute upper respiratory infection, unspecified: Secondary | ICD-10-CM

## 2018-07-02 MED ORDER — AZITHROMYCIN 250 MG PO TABS: ORAL_TABLET | ORAL | 0 refills | Status: DC

## 2018-07-02 MED ORDER — BENZONATATE 100 MG PO CAPS: 100.0000 mg | ORAL_CAPSULE | Freq: Three times a day (TID) | ORAL | 0 refills | Status: DC | PRN

## 2018-07-02 NOTE — Progress Notes (Signed)
Subjective:    Patient ID: Melissa Zavala, female    DOB: 06/03/1940, 78 y.o.   MRN: [id number]   Chief Complaint: Cough and Nasal Congestion   HPI Patient come sin c/o nasal congestion and cough started last night. Cough is making her throat sore. She had pneumonia 2 years ago and wants to make sure she is ok.  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  HENT: Positive for congestion, rhinorrhea and sore throat. Negative for trouble swallowing.   Respiratory: Positive for cough.   Musculoskeletal: Positive for myalgias.  Neurological: Positive for headaches.  Psychiatric/Behavioral: Negative.   All other systems reviewed and are negative.      Objective:   Physical Exam Vitals signs and nursing note reviewed.  Constitutional:      General: She is not in acute distress.    Appearance: She is normal weight.  HENT:     Right Ear: Hearing, tympanic membrane, ear canal and external ear normal.     Left Ear: Hearing, tympanic membrane, ear canal and external ear normal.     Nose: Congestion and rhinorrhea present.     Right Sinus: No maxillary sinus tenderness or frontal sinus tenderness.     Left Sinus: No frontal sinus tenderness.     Mouth/Throat:     Lips: Pink.     Mouth: Mucous membranes are moist.     Pharynx: Oropharynx is clear.  Neck:     Musculoskeletal: Normal range of motion and neck supple.  Cardiovascular:     Rate and Rhythm: Normal rate and regular rhythm.     Heart sounds: Normal heart sounds.  Pulmonary:     Breath sounds: Normal breath sounds.     Comments: Deep dry cough Lymphadenopathy:     Cervical: No cervical adenopathy.  Skin:    General: Skin is warm.  Neurological:     General: No focal deficit present.     Mental Status: She is alert and oriented to person, place, and time.  Psychiatric:        Mood and Affect: Mood normal.        Behavior: Behavior normal.     BP 134/77   Pulse 83   Temp (!) 97.1 F (36.2 C) (Oral)   Ht 5\' 3"  (1.6 m)   Wt 195 lb (88.5 kg)   BMI 34.54 kg/m        Assessment & Plan:  [name] in today with chief complaint of Cough and Nasal Congestion   1. Upper respiratory infection with cough and congestion 1. Take meds as prescribed 2. Use a cool mist humidifier especially during the winter months and when heat has been humid. 3. Use saline nose sprays frequently 4. Saline irrigations of the nose can be very helpful if done frequently.  * 4X daily for 1 week*  * Use of a nettie pot can be helpful with this. Follow directions with this* 5. Drink plenty of fluids 6. Keep thermostat turn down low 7.For any cough or congestion  Use plain Mucinex- regular strength or max strength is fine   * Children- consult with Pharmacist for dosing 8. For fever or aces or pains- take tylenol or ibuprofen appropriate for age and weight.  * for fevers greater than 101 orally you may alternate ibuprofen and tylenol every  3 hours.    - azithromycin (ZITHROMAX Z-PAK) 250 MG tablet;  As directed  Dispense: 6 tablet; Refill: 0 - benzonatate (TESSALON PERLES) 100 MG capsule; Take 1 capsule (100 mg total) by mouth 3 (three) times daily as needed for cough.  Dispense: 20 capsule; Refill: 0  Danielle-Margaret Hassell Done, FNP

## 2018-07-02 NOTE — Patient Instructions (Signed)

## 2018-07-08 ENCOUNTER — Ambulatory Visit (INDEPENDENT_AMBULATORY_CARE_PROVIDER_SITE_OTHER): Payer: Medicare HMO | Admitting: Family Medicine

## 2018-07-08 VITALS — BP 127/66 | HR 89 | Temp 97.7°F | Ht 63.0 in | Wt 195.4 lb

## 2018-07-08 DIAGNOSIS — R220 Localized swelling, mass and lump, head: Secondary | ICD-10-CM | POA: Diagnosis not present

## 2018-07-08 DIAGNOSIS — R221 Localized swelling, mass and lump, neck: Secondary | ICD-10-CM | POA: Diagnosis not present

## 2018-07-08 MED ORDER — METHYLPREDNISOLONE ACETATE 40 MG/ML IJ SUSP
40.0000 mg | Freq: Once | INTRAMUSCULAR | Status: AC
  Administered 2018-07-08: 40 mg via INTRAMUSCULAR

## 2018-07-08 MED ORDER — PREDNISONE 20 MG PO TABS: 40.0000 mg | ORAL_TABLET | Freq: Every day | ORAL | 0 refills | Status: AC

## 2018-07-08 NOTE — Progress Notes (Signed)
Subjective: CC: "swollen glands" PCP: Terald Sleeper, PA-Melissa CNO:BSJG Melissa Zavala is a 78 y.o. female presenting to clinic today for:  1. Swollen glands Patient was seen on 07/02/2018 for a URI and prescribed Z-Pak and Tessalon Perles.  She notes that this morning, she woke up with swollen glands.  She points to the upper aspect of the neck.  She denies any associated throat itching, throat swelling, shortness of breath, wheeze.  She has had no fevers.  She has completed the Z-Pak.  She does note some tenderness bilaterally to the anterior neck.   ROS: Per HPI  Allergies  Allergen Reactions  . Iodinated Diagnostic Agents Anaphylaxis  . Codeine Other (See Comments)    Hallucinations  . Diphenhydramine Other (See Comments)    Patient states "feels like somebody is inside of me clawing me out"   Past Medical History:  Diagnosis Date  . Arthritis   . Hypertension     Current Outpatient Medications:  .  amLODipine (NORVASC) 10 MG tablet, Take 1 tablet (10 mg total) by mouth daily., Disp: 30 tablet, Rfl: 11 .  azithromycin (ZITHROMAX Z-PAK) 250 MG tablet, As directed, Disp: 6 tablet, Rfl: 0 .  benzonatate (TESSALON PERLES) 100 MG capsule, Take 1 capsule (100 mg total) by mouth 3 (three) times daily as needed for cough., Disp: 20 capsule, Rfl: 0 .  celecoxib (CELEBREX) 200 MG capsule, Take 1 capsule (200 mg total) by mouth daily., Disp: 90 capsule, Rfl: 3 .  conjugated estrogens (PREMARIN) vaginal cream, Place 1 Applicatorful vaginally daily., Disp: 42.5 g, Rfl: 12 .  esomeprazole (NEXIUM) 40 MG capsule, TAKE (1) CAPSULE DAILY., Disp: 30 capsule, Rfl: 11 .  fluconazole (DIFLUCAN) 150 MG tablet, 1 po q week x 4 weeks, Disp: 4 tablet, Rfl: 0 .  furosemide (LASIX) 20 MG tablet, TAKE ONE TABLET DAILY. WHEN LOTS OF SWELLING., Disp: 90 tablet, Rfl: 3 .  gabapentin (NEURONTIN) 100 MG capsule, Take 1 capsule (100 mg total) by mouth 3 (three) times daily., Disp: 90 capsule, Rfl: 5 .  lisinopril  (PRINIVIL,ZESTRIL) 30 MG tablet, Take 1 tablet (30 mg total) by mouth daily., Disp: 30 tablet, Rfl: 11 .  loratadine (CLARITIN) 10 MG tablet, Take 1 tablet (10 mg total) by mouth daily., Disp: 30 tablet, Rfl: 11 .  nystatin (MYCOSTATIN/NYSTOP) powder, Apply topically 4 (four) times daily., Disp: 60 g, Rfl: 5 .  potassium chloride (K-DUR) 10 MEQ tablet, Take 1 tablet (10 mEq total) by mouth daily., Disp: 30 tablet, Rfl: 11 .  traMADol (ULTRAM) 50 MG tablet, Take 2 tablets (100 mg total) by mouth every 6 (six) hours as needed., Disp: 120 tablet, Rfl: 5  Current Facility-Administered Medications:  .  cyanocobalamin ((VITAMIN B-12)) injection 1,000 mcg, 1,000 mcg, Intramuscular, Q30 days, Particia Nearing S, PA-Melissa, 1,000 mcg at 03/12/18 1053 Social History   Socioeconomic History  . Marital status: Legally Separated    Spouse name: Not on file  . Number of children: Not on file  . Years of education: Not on file  . Highest education level: Not on file  Occupational History  . Not on file  Social Needs  . Financial resource strain: Not on file  . Food insecurity:    Worry: Not on file    Inability: Not on file  . Transportation needs:    Medical: Not on file    Non-medical: Not on file  Tobacco Use  . Smoking status: Never Smoker  . Smokeless tobacco: Never Used  Substance and Sexual Activity  . Alcohol use: No  . Drug use: No  . Sexual activity: Not Currently  Lifestyle  . Physical activity:    Days per week: Not on file    Minutes per session: Not on file  . Stress: Not on file  Relationships  . Social connections:    Talks on phone: Not on file    Gets together: Not on file    Attends religious service: Not on file    Active member of club or organization: Not on file    Attends meetings of clubs or organizations: Not on file    Relationship status: Not on file  . Intimate partner violence:    Fear of current or ex partner: Not on file    Emotionally abused: Not on file     Physically abused: Not on file    Forced sexual activity: Not on file  Other Topics Concern  . Not on file  Social History Narrative  . Not on file   Family History  Problem Relation Age of Onset  . Cancer Mother        Breast    Objective: Office vital signs reviewed. BP 127/66   Pulse 89   Temp 97.7 F (36.5 Melissa) (Oral)   Ht 5\' 3"  (1.6 m)   Wt 195 lb 6.4 oz (88.6 kg)   BMI 34.61 kg/m   Physical Examination:  General: Awake, alert, nontoxic. No acute distress HEENT: Normal    Neck: No masses palpated. No enlargement of the anterior or posterior cervical lymph nodes.  She does have some submandibular swelling and tenderness over these areas..    Throat: moist mucus membranes, no erythema, airways patent Cardio: regular rate Pulm: No wheeze.  Normal work of breathing on room air  Assessment/ Plan: 78 y.o. female   1. Submandibular swelling Of uncertain etiology.  This certainly could be related to recent infection.  It does not appear to be an adverse reaction to her antibiotic.  She is afebrile and nontoxic-appearing.  Her physical exam aside from the submandibular swelling was nonfocal.  She was given a dose of Depo-Medrol 40 mg intramuscularly here in office and placed on a short burst of prednisone for the next 3 days.  Home care instructions reviewed with the patient.  Reasons for return and emergent evaluation emergency department discussed.  I have advised her to follow-up in 1 week for recheck, sooner if needed. - methylPREDNISolone acetate (DEPO-MEDROL) injection 40 mg - predniSONE (DELTASONE) 20 MG tablet; Take 2 tablets (40 mg total) by mouth daily with breakfast for 3 days. 2 po at same time daily for 5 days  Dispense: 6 tablet; Refill: 0   No orders of the defined types were placed in this encounter.  Meds ordered this encounter  Medications  . methylPREDNISolone acetate (DEPO-MEDROL) injection 40 mg  . predniSONE (DELTASONE) 20 MG tablet    Sig: Take 2  tablets (40 mg total) by mouth daily with breakfast for 3 days. 2 po at same time daily for 5 days    Dispense:  6 tablet    Refill:  0     Tory Mckissack Windell Moulding, DO [place] [phone]

## 2018-07-08 NOTE — Patient Instructions (Signed)
The reason for your swelling is unclear.  I am going to put you on a course of steroids to see if we can improve this.  I would like you to follow up in 1 week if the swelling persists.  I would like you to be seen sooner if it gets worse.  If you develop throat tightness, itching, difficulty swallowing or breathing, please seek immediate medical attention emergency department.

## 2018-07-22 ENCOUNTER — Encounter: Payer: Self-pay | Admitting: Physician Assistant

## 2018-07-22 ENCOUNTER — Ambulatory Visit (INDEPENDENT_AMBULATORY_CARE_PROVIDER_SITE_OTHER): Payer: Medicare HMO | Admitting: Physician Assistant

## 2018-07-22 VITALS — BP 133/63 | HR 76 | Temp 98.6°F | Ht 63.0 in | Wt 194.6 lb

## 2018-07-22 DIAGNOSIS — E538 Deficiency of other specified B group vitamins: Secondary | ICD-10-CM | POA: Diagnosis not present

## 2018-07-22 DIAGNOSIS — L049 Acute lymphadenitis, unspecified: Secondary | ICD-10-CM

## 2018-07-22 MED ORDER — CEPHALEXIN 500 MG PO CAPS: 500.0000 mg | ORAL_CAPSULE | Freq: Four times a day (QID) | ORAL | 0 refills | Status: DC

## 2018-07-22 NOTE — Progress Notes (Signed)
BP 133/63   Pulse 76   Temp 98.6 F (37 C) (Oral)   Ht _0  (1.6 m)   Wt 194 lb 9.6 oz (88.3 kg)   BMI 34.47 kg/m    Subjective:    Patient ID: Melissa Zavala, female    DOB: 11/06/1940, 78 y.o.   MRN: [id number]  HPI: Melissa Zavala is a 78 y.o. female presenting on 07/22/2018 for lymphnode swelling (2 week rck ); Sore Throat; Flank Pain; and Cough  This patient comes in for recheck on the lymphedenitis that she was experiencing a couple weeks ago.  She was treated with an antibiotic.  She has completed the course.  She denies any fever or chills at this time.  However she continues with left anterior cervical node swelling.  It is not affecting her breathing or swallowing.  She denies any fever or chills.  We have discussed using an antibiotic for a little bit longer and to recheck her in a couple weeks and if things are not completely improved to make a ear nose and throat referral.   Past Medical History:  Diagnosis Date  . Arthritis   . Hypertension    Relevant past medical, surgical, family and social history reviewed and updated as indicated. Interim medical history since our last visit reviewed. Allergies and medications reviewed and updated. DATA REVIEWED: CHART IN EPIC  Family History reviewed for pertinent findings.  Review of Systems  Constitutional: Negative.  Negative for fatigue and fever.  HENT: Positive for congestion. Negative for sinus pain and voice change.   Eyes: Negative.   Respiratory: Positive for cough. Negative for shortness of breath, wheezing and stridor.   Cardiovascular: Negative.  Negative for chest pain, palpitations and leg swelling.  Gastrointestinal: Negative.   Genitourinary: Negative.     Allergies as of 07/22/2018      Reactions   Iodinated Diagnostic Agents Anaphylaxis   Codeine Other (See Comments)   Hallucinations   Diphenhydramine Other (See Comments)   Patient states "feels like somebody is inside of me clawing me out"      Medication List       Accurate as of July 22, 2018  9:53 PM. Always use your most recent med list.        amLODipine 10 MG tablet Commonly known as:  NORVASC Take 1 tablet (10 mg total) by mouth daily.   benzonatate 100 MG capsule Commonly known as:  Tessalon Perles Take 1 capsule (100 mg total) by mouth 3 (three) times daily as needed for cough.   celecoxib 200 MG capsule Commonly known as:  CELEBREX Take 1 capsule (200 mg total) by mouth daily.   cephALEXin 500 MG capsule Commonly known as:  KEFLEX Take 1 capsule (500 mg total) by mouth 4 (four) times daily.   conjugated estrogens vaginal cream Commonly known as:  PREMARIN Place 1 Applicatorful vaginally daily.   esomeprazole 40 MG capsule Commonly known as:  NEXIUM TAKE (1) CAPSULE DAILY.   fluconazole 150 MG tablet Commonly known as:  Diflucan 1 po q week x 4 weeks   furosemide 20 MG tablet Commonly known as:  LASIX TAKE ONE TABLET DAILY. WHEN LOTS OF SWELLING.   gabapentin 100 MG capsule Commonly known as:  NEURONTIN Take 1 capsule (100 mg total) by mouth 3 (three) times daily.   lisinopril 30 MG tablet Commonly known as:  PRINIVIL,ZESTRIL Take 1 tablet (30 mg total) by mouth daily.   nystatin powder Commonly known  as:  MYCOSTATIN/NYSTOP Apply topically 4 (four) times daily.   potassium chloride 10 MEQ tablet Commonly known as:  K-DUR Take 1 tablet (10 mEq total) by mouth daily.   traMADol 50 MG tablet Commonly known as:  ULTRAM Take 2 tablets (100 mg total) by mouth every 6 (six) hours as needed.          Objective:    BP 133/63   Pulse 76   Temp 98.6 F (37 C) (Oral)   Ht _0  (1.6 m)   Wt 194 lb 9.6 oz (88.3 kg)   BMI 34.47 kg/m   Allergies  Allergen Reactions  . Iodinated Diagnostic Agents Anaphylaxis  . Codeine Other (See Comments)    Hallucinations  . Diphenhydramine Other (See Comments)    Patient states "feels like somebody is inside of me clawing me out"    Wt Readings  from Last 3 Encounters:  07/22/18 194 lb 9.6 oz (88.3 kg)  07/08/18 195 lb 6.4 oz (88.6 kg)  07/02/18 195 lb (88.5 kg)    Physical Exam Constitutional:      Appearance: She is well-developed.  HENT:     Head: Normocephalic and atraumatic.     Nose: Congestion present.     Mouth/Throat:     Mouth: No oral lesions.     Pharynx: No oropharyngeal exudate.  Eyes:     Conjunctiva/sclera: Conjunctivae normal.     Pupils: Pupils are equal, round, and reactive to light.  Neck:   Cardiovascular:     Rate and Rhythm: Normal rate and regular rhythm.     Heart sounds: Normal heart sounds.  Pulmonary:     Effort: Pulmonary effort is normal.     Breath sounds: Normal breath sounds.  Abdominal:     General: Bowel sounds are normal.     Palpations: Abdomen is soft.  Lymphadenopathy:     Cervical: Cervical adenopathy present.     Right cervical: No superficial or deep cervical adenopathy.    Left cervical: Superficial cervical adenopathy present. No deep cervical adenopathy.  Skin:    General: Skin is warm and dry.     Findings: No rash.  Neurological:     Mental Status: She is alert and oriented to person, place, and time.     Deep Tendon Reflexes: Reflexes are normal and symmetric.  Psychiatric:        Behavior: Behavior normal.        Thought Content: Thought content normal.        Judgment: Judgment normal.     Results for orders placed or performed in visit on 06/14/18  CBC with Differential/Platelet  Result Value Ref Range   WBC 7.8 3.4 - 10.8 x10E3/uL   RBC 4.29 3.77 - 5.28 x10E6/uL   Hemoglobin 11.1 11.1 - 15.9 g/dL   Hematocrit 35.3 34.0 - 46.6 %   MCV 82 79 - 97 fL   MCH 25.9 (L) 26.6 - 33.0 pg   MCHC 31.4 (L) 31.5 - 35.7 g/dL   RDW 12.7 11.7 - 15.4 %   Platelets 324 150 - 450 x10E3/uL   Neutrophils 62 Not Estab. %   Lymphs 28 Not Estab. %   Monocytes 6 Not Estab. %   Eos 3 Not Estab. %   Basos 1 Not Estab. %   Neutrophils Absolute 4.8 1.4 - 7.0 x10E3/uL    Lymphocytes Absolute 2.2 0.7 - 3.1 x10E3/uL   Monocytes Absolute 0.5 0.1 - 0.9 x10E3/uL   EOS (ABSOLUTE) 0.3  0.0 - 0.4 x10E3/uL   Basophils Absolute 0.0 0.0 - 0.2 x10E3/uL   Immature Granulocytes 0 Not Estab. %   Immature Grans (Abs) 0.0 0.0 - 0.1 x10E3/uL  CMP14+EGFR  Result Value Ref Range   Glucose 87 65 - 99 mg/dL   BUN 18 8 - 27 mg/dL   Creatinine, Ser 0.74 0.57 - 1.00 mg/dL   GFR calc non Af Amer 78 >59 mL/min/1.73   GFR calc Af Amer 90 >59 mL/min/1.73   BUN/Creatinine Ratio 24 12 - 28   Sodium 142 134 - 144 mmol/L   Potassium 4.1 3.5 - 5.2 mmol/L   Chloride 105 96 - 106 mmol/L   CO2 22 20 - 29 mmol/L   Calcium 9.9 8.7 - 10.3 mg/dL   Total Protein 6.4 6.0 - 8.5 g/dL   Albumin 4.1 3.7 - 4.7 g/dL   Globulin, Total 2.3 1.5 - 4.5 g/dL   Albumin/Globulin Ratio 1.8 1.2 - 2.2   Bilirubin Total 0.2 0.0 - 1.2 mg/dL   Alkaline Phosphatase 74 39 - 117 IU/L   AST 10 0 - 40 IU/L   ALT 7 0 - 32 IU/L  Lipid panel  Result Value Ref Range   Cholesterol, Total 207 (H) 100 - 199 mg/dL   Triglycerides 76 0 - 149 mg/dL   HDL 83 >39 mg/dL   VLDL Cholesterol Cal 15 5 - 40 mg/dL   LDL Calculated 109 (H) 0 - 99 mg/dL   Chol/HDL Ratio 2.5 0.0 - 4.4 ratio  TSH  Result Value Ref Range   TSH 0.368 (L) 0.450 - 4.500 uIU/mL      Assessment & Plan:   1. Lymphadenitis, acute - cephALEXin (KEFLEX) 500 MG capsule; Take 1 capsule (500 mg total) by mouth 4 (four) times daily.  Dispense: 40 capsule; Refill: 0   Continue all other maintenance medications as listed above.  Follow up plan: Return in about 3 weeks (around 08/12/2018) for recheck.  Educational handout given for [place] PA-C [place] [address]  [place], [place] [address] [phone]   07/22/2018, 9:53 PM

## 2018-07-26 DIAGNOSIS — Z96641 Presence of right artificial hip joint: Secondary | ICD-10-CM | POA: Diagnosis not present

## 2018-07-26 DIAGNOSIS — M25571 Pain in right ankle and joints of right foot: Secondary | ICD-10-CM | POA: Diagnosis not present

## 2018-07-26 DIAGNOSIS — G8929 Other chronic pain: Secondary | ICD-10-CM | POA: Diagnosis not present

## 2018-07-26 DIAGNOSIS — M19071 Primary osteoarthritis, right ankle and foot: Secondary | ICD-10-CM | POA: Diagnosis not present

## 2018-07-26 DIAGNOSIS — Z471 Aftercare following joint replacement surgery: Secondary | ICD-10-CM | POA: Diagnosis not present

## 2018-08-09 ENCOUNTER — Other Ambulatory Visit: Payer: Self-pay

## 2018-08-12 ENCOUNTER — Encounter: Payer: Self-pay | Admitting: Physician Assistant

## 2018-08-12 ENCOUNTER — Ambulatory Visit (INDEPENDENT_AMBULATORY_CARE_PROVIDER_SITE_OTHER): Payer: Medicare HMO | Admitting: Physician Assistant

## 2018-08-12 ENCOUNTER — Other Ambulatory Visit: Payer: Self-pay

## 2018-08-12 VITALS — BP 145/68 | HR 59 | Temp 97.1°F | Ht 63.0 in | Wt 198.2 lb

## 2018-08-12 DIAGNOSIS — I889 Nonspecific lymphadenitis, unspecified: Secondary | ICD-10-CM

## 2018-08-12 DIAGNOSIS — L720 Epidermal cyst: Secondary | ICD-10-CM | POA: Diagnosis not present

## 2018-08-12 MED ORDER — FLUCONAZOLE 150 MG PO TABS: ORAL_TABLET | ORAL | 0 refills | Status: DC

## 2018-08-13 NOTE — Progress Notes (Signed)
BP (!) 145/68   Pulse (!) 59   Temp (!) 97.1 F (36.2 C) (Oral)   Ht _0  (1.6 m)   Wt 198 lb 3.2 oz (89.9 kg)   BMI 35.11 kg/m    Subjective:    Patient ID: Melissa Zavala, female    DOB: [birth year], 78 y.o.   MRN: [id number]  HPI: Melissa Zavala is a 78 y.o. female presenting on 08/12/2018 for lymphadenitis (3 week follow up. Patient states she has yeast infection since she was on abx.) Patient comes in for follow-up on her anterior cervical lymphadenitis.  It is greatly improved compared to before.  However there still is a little bit of residual pain and fullness there.  It does not bother her when she swallows or moves her neck.  We have discussed that if it is persistent we would like for her to go to ear nose and throat evaluation.  It will probably be a few weeks before they are able to see her.  And if it has resolved at that time she is able to cancel it.  She does have a yeast infection from the antibiotic she is taken.  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  It will sometimes be more irritated and painful.  It has never gotten pustulous.  It does have a black head to it and raised firmness.   Past Medical History:  Diagnosis Date  . Arthritis   . Hypertension    Relevant past medical, surgical, family and social history reviewed and updated as indicated. Interim medical history since our last visit reviewed. Allergies and medications reviewed and updated. DATA REVIEWED: CHART IN EPIC  Family History reviewed for pertinent findings.  Review of Systems  Constitutional: Negative.   HENT: Negative.  Negative for congestion, sinus pressure, sore throat, trouble swallowing and voice change.   Eyes: Negative.   Respiratory: Negative.   Gastrointestinal: Negative.   Genitourinary: Negative.   Skin: Positive for color change.    Allergies as of 08/12/2018      Reactions   Iodinated Diagnostic Agents Anaphylaxis    Codeine Other (See Comments)   Hallucinations   Diphenhydramine Other (See Comments)   Patient states "feels like somebody is inside of me clawing me out"      Medication List       Accurate as of August 12, 2018 11:59 PM. Always use your most recent med list.        amLODipine 10 MG tablet Commonly known as:  NORVASC Take 1 tablet (10 mg total) by mouth daily.   celecoxib 200 MG capsule Commonly known as:  CELEBREX Take 1 capsule (200 mg total) by mouth daily.   conjugated estrogens vaginal cream Commonly known as:  PREMARIN Place 1 Applicatorful vaginally daily.   esomeprazole 40 MG capsule Commonly known as:  NEXIUM TAKE (1) CAPSULE DAILY.   fluconazole 150 MG tablet Commonly known as:  Diflucan 1 po q week x 4 weeks   furosemide 20 MG tablet Commonly known as:  LASIX TAKE ONE TABLET DAILY. WHEN LOTS OF SWELLING.   gabapentin 100 MG capsule Commonly known as:  NEURONTIN Take 1 capsule (100 mg total) by mouth 3 (three) times daily.   lisinopril 30 MG tablet Commonly known as:  PRINIVIL,ZESTRIL Take 1 tablet (30 mg total) by mouth daily.   nystatin powder Commonly known as:  MYCOSTATIN/NYSTOP Apply topically 4 (four) times  daily.   potassium chloride 10 MEQ tablet Commonly known as:  K-DUR Take 1 tablet (10 mEq total) by mouth daily.   traMADol 50 MG tablet Commonly known as:  ULTRAM Take 2 tablets (100 mg total) by mouth every 6 (six) hours as needed.          Objective:    BP (!) 145/68   Pulse (!) 59   Temp (!) 97.1 F (36.2 C) (Oral)   Ht _0  (1.6 m)   Wt 198 lb 3.2 oz (89.9 kg)   BMI 35.11 kg/m   Allergies  Allergen Reactions  . Iodinated Diagnostic Agents Anaphylaxis  . Codeine Other (See Comments)    Hallucinations  . Diphenhydramine Other (See Comments)    Patient states "feels like somebody is inside of me clawing me out"    Wt Readings from Last 3 Encounters:  08/12/18 198 lb 3.2 oz (89.9 kg)  07/22/18 194 lb 9.6 oz  (88.3 kg)  07/08/18 195 lb 6.4 oz (88.6 kg)    Physical Exam Constitutional:      Appearance: She is well-developed.  HENT:     Head: Normocephalic and atraumatic.  Eyes:     Conjunctiva/sclera: Conjunctivae normal.     Pupils: Pupils are equal, round, and reactive to light.  Neck:   Cardiovascular:     Rate and Rhythm: Normal rate and regular rhythm.     Heart sounds: Normal heart sounds.  Pulmonary:     Effort: Pulmonary effort is normal.     Breath sounds: Normal breath sounds.  Abdominal:     General: Bowel sounds are normal.     Palpations: Abdomen is soft.  Lymphadenopathy:     Cervical: Cervical adenopathy present.  Skin:    General: Skin is warm and dry.     Findings: Lesion and rash present. Rash is papular.       Neurological:     Mental Status: She is alert and oriented to person, place, and time.     Deep Tendon Reflexes: Reflexes are normal and symmetric.  Psychiatric:        Behavior: Behavior normal.        Thought Content: Thought content normal.        Judgment: Judgment normal.     Results for orders placed or performed in visit on 06/14/18  CBC with Differential/Platelet  Result Value Ref Range   WBC 7.8 3.4 - 10.8 x10E3/uL   RBC 4.29 3.77 - 5.28 x10E6/uL   Hemoglobin 11.1 11.1 - 15.9 g/dL   Hematocrit 35.3 34.0 - 46.6 %   MCV 82 79 - 97 fL   MCH 25.9 (L) 26.6 - 33.0 pg   MCHC 31.4 (L) 31.5 - 35.7 g/dL   RDW 12.7 11.7 - 15.4 %   Platelets 324 150 - 450 x10E3/uL   Neutrophils 62 Not Estab. %   Lymphs 28 Not Estab. %   Monocytes 6 Not Estab. %   Eos 3 Not Estab. %   Basos 1 Not Estab. %   Neutrophils Absolute 4.8 1.4 - 7.0 x10E3/uL   Lymphocytes Absolute 2.2 0.7 - 3.1 x10E3/uL   Monocytes Absolute 0.5 0.1 - 0.9 x10E3/uL   EOS (ABSOLUTE) 0.3 0.0 - 0.4 x10E3/uL   Basophils Absolute 0.0 0.0 - 0.2 x10E3/uL   Immature Granulocytes 0 Not Estab. %   Immature Grans (Abs) 0.0 0.0 - 0.1 x10E3/uL  CMP14+EGFR  Result Value Ref Range   Glucose 87  65 - 99 mg/dL  BUN 18 8 - 27 mg/dL   Creatinine, Ser 0.74 0.57 - 1.00 mg/dL   GFR calc non Af Amer 78 >59 mL/min/1.73   GFR calc Af Amer 90 >59 mL/min/1.73   BUN/Creatinine Ratio 24 12 - 28   Sodium 142 134 - 144 mmol/L   Potassium 4.1 3.5 - 5.2 mmol/L   Chloride 105 96 - 106 mmol/L   CO2 22 20 - 29 mmol/L   Calcium 9.9 8.7 - 10.3 mg/dL   Total Protein 6.4 6.0 - 8.5 g/dL   Albumin 4.1 3.7 - 4.7 g/dL   Globulin, Total 2.3 1.5 - 4.5 g/dL   Albumin/Globulin Ratio 1.8 1.2 - 2.2   Bilirubin Total 0.2 0.0 - 1.2 mg/dL   Alkaline Phosphatase 74 39 - 117 IU/L   AST 10 0 - 40 IU/L   ALT 7 0 - 32 IU/L  Lipid panel  Result Value Ref Range   Cholesterol, Total 207 (H) 100 - 199 mg/dL   Triglycerides 76 0 - 149 mg/dL   HDL 83 >39 mg/dL   VLDL Cholesterol Cal 15 5 - 40 mg/dL   LDL Calculated 109 (H) 0 - 99 mg/dL   Chol/HDL Ratio 2.5 0.0 - 4.4 ratio  TSH  Result Value Ref Range   TSH 0.368 (L) 0.450 - 4.500 uIU/mL      Assessment & Plan:   1. Lymphadenitis Consistently watch Referral to ear nose and throat if it has not resolved.  2. Epidermoid cyst of skin Avoid touching, can go for dermatology evaluation   Continue all other maintenance medications as listed above.  Follow up plan: 3-4 months recheck  Educational handout given for [place] PA-C [place] [address]  [place], [place] [address] [phone]   08/13/2018, 7:29 PM

## 2018-08-20 ENCOUNTER — Telehealth: Payer: Self-pay | Admitting: *Deleted

## 2018-08-20 DIAGNOSIS — R42 Dizziness and giddiness: Secondary | ICD-10-CM

## 2018-08-20 MED ORDER — MECLIZINE HCL 25 MG PO TABS: 25.0000 mg | ORAL_TABLET | Freq: Three times a day (TID) | ORAL | 0 refills | Status: DC | PRN

## 2018-08-20 NOTE — Telephone Encounter (Signed)
Received refill request for meclizine 25 mg chewables for patient.  Medication no longer on medication list.  Please advise.

## 2018-08-20 NOTE — Telephone Encounter (Signed)
I sent a refill of what you prescribed the last time.

## 2018-08-20 NOTE — Telephone Encounter (Signed)
That is okay, are the instructions on the request? Or do I need to do it?

## 2018-08-26 ENCOUNTER — Ambulatory Visit: Payer: Medicare HMO

## 2018-09-18 ENCOUNTER — Other Ambulatory Visit: Payer: Self-pay

## 2018-09-18 ENCOUNTER — Ambulatory Visit: Payer: Medicare HMO | Admitting: *Deleted

## 2018-10-02 ENCOUNTER — Other Ambulatory Visit: Payer: Self-pay

## 2018-10-02 ENCOUNTER — Ambulatory Visit: Payer: Medicare HMO | Admitting: *Deleted

## 2018-10-11 ENCOUNTER — Ambulatory Visit: Payer: Self-pay

## 2018-10-28 ENCOUNTER — Telehealth: Payer: Self-pay | Admitting: Physician Assistant

## 2018-10-28 NOTE — Telephone Encounter (Signed)
What dx does she use the tramadol for

## 2018-10-28 NOTE — Telephone Encounter (Signed)
Primary osteoarthritis of multiple joints

## 2018-10-28 NOTE — Telephone Encounter (Signed)
Spoke with pharmacy

## 2018-10-31 ENCOUNTER — Ambulatory Visit (INDEPENDENT_AMBULATORY_CARE_PROVIDER_SITE_OTHER): Payer: Medicare HMO | Admitting: Otolaryngology

## 2018-11-06 ENCOUNTER — Ambulatory Visit: Payer: Medicare HMO

## 2018-11-19 ENCOUNTER — Ambulatory Visit (INDEPENDENT_AMBULATORY_CARE_PROVIDER_SITE_OTHER): Payer: Medicare HMO | Admitting: *Deleted

## 2018-11-19 ENCOUNTER — Other Ambulatory Visit: Payer: Self-pay

## 2018-11-19 VITALS — Ht 63.0 in | Wt 198.2 lb

## 2018-11-19 DIAGNOSIS — Z0000 Encounter for general adult medical examination without abnormal findings: Secondary | ICD-10-CM | POA: Diagnosis not present

## 2018-11-19 NOTE — Progress Notes (Signed)
MEDICARE ANNUAL WELLNESS VISIT  11/19/2018  Telephone Visit Disclaimer This Medicare AWV was conducted by telephone due to national recommendations for restrictions regarding the COVID-19 Pandemic (e.g. social distancing).  I verified, using two identifiers, that I am speaking with Melissa Zavala or their authorized healthcare agent. I discussed the limitations, risks, security, and privacy concerns of performing an evaluation and management service by telephone and the potential availability of an in-person appointment in the future. The patient expressed understanding and agreed to proceed.   Subjective:  Melissa Zavala is a 78 y.o. female patient of Terald Sleeper, PA-C who had a Medicare Annual Wellness Visit today via telephone. Melissa Zavala is Retired and lives alone. she has 7 children. she reports that she is socially active and does interact with friends/family regularly. she is minimally physically active and enjoys gardening.  Patient Care Team: Theodoro Clock as PCP - General (Physician Assistant)  Advanced Directives 11/19/2018 01/28/2018 01/18/2018 06/26/2017 07/18/2016 02/14/2016 02/14/2016  Does Patient Have a Medical Advance Directive? No No No No Yes Yes No  Type of Advance Directive - - - - Healthcare Power of Attorney Living will -  Does patient want to make changes to medical advance directive? - - - - - - -  Copy of Monument in Chart? - - - - - - -  Would patient like information on creating a medical advance directive? Yes (MAU/Ambulatory/Procedural Areas - Information given) - - - - - [facility] Utilization Over the Past 12 Months: # of hospitalizations or ER visits: 1 # of surgeries: 1  Review of Systems    Patient reports that her overall health is unchanged compared to last year.  Patient Reported Readings (BP, Pulse, CBG, Weight, etc) none  Review of Systems: History obtained from chart review and the patient General ROS: negative  All  other systems negative.  Pain Assessment Pain : No/denies pain     Current Medications & Allergies (verified) Allergies as of 11/19/2018      Reactions   Iodinated Diagnostic Agents Anaphylaxis   Codeine Other (See Comments)   Hallucinations   Diphenhydramine Other (See Comments)   Patient states "feels like somebody is inside of me clawing me out"      Medication List       Accurate as of November 19, 2018  2:10 PM. If you have any questions, ask your nurse or doctor.        amLODipine 10 MG tablet Commonly known as: NORVASC Take 1 tablet (10 mg total) by mouth daily.   celecoxib 200 MG capsule Commonly known as: CELEBREX Take 1 capsule (200 mg total) by mouth daily.   conjugated estrogens vaginal cream Commonly known as: PREMARIN Place 1 Applicatorful vaginally daily.   esomeprazole 40 MG capsule Commonly known as: NEXIUM TAKE (1) CAPSULE DAILY.   fluconazole 150 MG tablet Commonly known as: Diflucan 1 po q week x 4 weeks   furosemide 20 MG tablet Commonly known as: LASIX TAKE ONE TABLET DAILY. WHEN LOTS OF SWELLING.   gabapentin 100 MG capsule Commonly known as: NEURONTIN Take 1 capsule (100 mg total) by mouth 3 (three) times daily.   lisinopril 30 MG tablet Commonly known as: ZESTRIL Take 1 tablet (30 mg total) by mouth daily.   meclizine 25 MG tablet Commonly known as: ANTIVERT Take 1 tablet (25 mg total) by mouth 3 (three) times daily as needed for dizziness.  nystatin powder Commonly known as: MYCOSTATIN/NYSTOP Apply topically 4 (four) times daily.   potassium chloride 10 MEQ tablet Commonly known as: K-DUR Take 1 tablet (10 mEq total) by mouth daily.   traMADol 50 MG tablet Commonly known as: ULTRAM Take 2 tablets (100 mg total) by mouth every 6 (six) hours as needed.       History (reviewed): Past Medical History:  Diagnosis Date  . Arthritis   . Hypertension    Past Surgical History:  Procedure Laterality Date  . TOTAL KNEE  ARTHROPLASTY    . TOTAL KNEE ARTHROPLASTY Bilateral   . TUBAL LIGATION     Family History  Problem Relation Age of Onset  . Cancer Mother        Breast   Social History   Socioeconomic History  . Marital status: Legally Separated    Spouse name: Not on file  . Number of children: 7  . Years of education: Not on file  . Highest education level: 8th grade  Occupational History  . Not on file  Social Needs  . Financial resource strain: Not hard at all  . Food insecurity    Worry: Never true    Inability: Never true  . Transportation needs    Medical: No    Non-medical: No  Tobacco Use  . Smoking status: Never Smoker  . Smokeless tobacco: Never Used  Substance and Sexual Activity  . Alcohol use: No  . Drug use: No  . Sexual activity: Not Currently  Lifestyle  . Physical activity    Days per week: 0 days    Minutes per session: 0 min  . Stress: Not at all  Relationships  . Social connections    Talks on phone: More than three times a week    Gets together: Once a week    Attends religious service: More than 4 times per year    Active member of club or organization: Yes    Attends meetings of clubs or organizations: More than 4 times per year    Relationship status: Separated  Other Topics Concern  . Not on file  Social History Narrative  . Not on file    Activities of Daily Living In your present state of health, do you have any difficulty performing the following activities: 11/19/2018  Hearing? N  Vision? N  Difficulty concentrating or making decisions? N  Walking or climbing stairs? N  Dressing or bathing? N  Doing errands, shopping? N  Preparing Food and eating ? N  Using the Toilet? N  In the past six months, have you accidently leaked urine? N  Do you have problems with loss of bowel control? N  Managing your Medications? N  Managing your Finances? N  Housekeeping or managing your Housekeeping? N  Some recent data might be hidden    Patient  Literacy How often do you need to have someone help you when you read instructions, pamphlets, or other written materials from your doctor or pharmacy?: 1 - Never What is the last grade level you completed in school?: 8th Grade  Exercise Current Exercise Habits: The patient does not participate in regular exercise at present, Exercise limited by: None identified  Diet Patient reports consuming 2 meals a day and 1 snack(s) a day Patient reports that her primary diet is: Regular Patient reports that she does have regular access to food.   Depression Screen PHQ 2/9 Scores 11/19/2018 07/22/2018 07/02/2018 06/14/2018 12/06/2017 08/13/2017 06/29/2017  PHQ - 2  Score 0 0 0 0 0 0 0     Fall Risk Fall Risk  11/19/2018 07/22/2018 07/02/2018 06/14/2018 12/06/2017  Falls in the past year? 0 0 0 0 No  Number falls in past yr: 0 - - - -  Injury with Fall? 0 - - - -  Follow up - - - - -     Objective:  Melissa Zavala seemed alert and oriented and she participated appropriately during our telephone visit.  Blood Pressure Weight BMI  BP Readings from Last 3 Encounters:  08/12/18 (!) 145/68  07/22/18 133/63  07/08/18 127/66   Wt Readings from Last 3 Encounters:  11/19/18 198 lb 3.2 oz (89.9 kg)  08/12/18 198 lb 3.2 oz (89.9 kg)  07/22/18 194 lb 9.6 oz (88.3 kg)   BMI Readings from Last 1 Encounters:  11/19/18 35.11 kg/m    *Unable to obtain current vital signs, weight, and BMI due to telephone visit type  Hearing/Vision  . Melissa Zavala did  seem to have difficulty with hearing/understanding during the telephone conversation . Reports that she has had a formal eye exam by an eye care professional within the past year . Reports that she has not had a formal hearing evaluation within the past year *Unable to fully assess hearing and vision during telephone visit type  Cognitive Function: 6CIT Screen 11/19/2018  What Year? 0 points  What month? 0 points  What time? 0 points  Count back from 20 0 points  Months  in reverse 0 points  Repeat phrase 2 points  Total Score 2   (Normal:0-7, Significant for Dysfunction: >8)  Normal Cognitive Function Screening: Yes   Immunization & Health Maintenance Record Immunization History  Administered Date(s) Administered  . Influenza, High Dose Seasonal PF 06/09/2016, 02/19/2017, 02/11/2018  . Influenza,trivalent, recombinat, inj, PF 02/24/2015  . Tdap 07/18/2016    Health Maintenance  Topic Date Due  . DEXA SCAN  11/07/2005  . PNA vac Low Risk Adult (1 of 2 - PCV13) 11/07/2005  . INFLUENZA VACCINE  12/14/2018  . TETANUS/TDAP  07/19/2026       Assessment  This is a routine wellness examination for Melissa Zavala.  Health Maintenance: Due or Overdue Health Maintenance Due  Topic Date Due  . DEXA SCAN  11/07/2005  . PNA vac Low Risk Adult (1 of 2 - PCV13) 11/07/2005    Melissa Zavala does not need a referral for Community Assistance: Care Management:   no Social Work:    no Prescription Assistance:  no Nutrition/Diabetes Education:  no   Plan:  Personalized Goals Goals Addressed            This Visit's Progress   . Exercise 3x per week (30 min per time)       Try to exercise for at least 30 minutes, 3 times weekly      Personalized Health Maintenance & Screening Recommendations  Pneumococcal vaccine  Bone densitometry screening Advanced directives: has NO advanced directive  - add't info requested. Referral to SW: no Shingrix  Lung Cancer Screening Recommended: no (Low Dose CT Chest recommended if Age 92-80 years, 30 pack-year currently smoking OR have quit w/in past 15 years) Hepatitis C Screening recommended: no HIV Screening recommended: no  Advanced Directives: Written information was prepared per patient's request.  Referrals & Orders No orders of the defined types were placed in this encounter.   Follow-up Plan . Follow-up with Terald Sleeper, PA-C as planned   I  have personally reviewed and noted the  following in the patient's chart:   . Medical and social history . Use of alcohol, tobacco or illicit drugs  . Current medications and supplements . Functional ability and status . Nutritional status . Physical activity . Advanced directives . List of other physicians . Hospitalizations, surgeries, and ER visits in previous 12 months . Vitals . Screenings to include cognitive, depression, and falls . Referrals and appointments  In addition, I have reviewed and discussed with Melissa Zavala certain preventive protocols, quality metrics, and best practice recommendations. A written personalized care plan for preventive services as well as general preventive health recommendations is available and can be mailed to the patient at her request.      Wardell Heath, LPN  08/19/4257

## 2018-11-19 NOTE — Patient Instructions (Signed)
[name] , Thank you for taking time to come for your Medicare Wellness Visit. I appreciate your ongoing commitment to your health goals. Please review the following plan we discussed and let me know if I can assist you in the future.   These are the goals we discussed: Goals     Exercise 3x per week (30 min per time)     Try to exercise for at least 30 minutes, 3 times weekly       This is a list of the screening recommended for you and due dates:  Health Maintenance  Topic Date Due   DEXA scan (bone density measurement)  11/07/2005   Pneumonia vaccines (1 of 2 - PCV13) 11/07/2005   Flu Shot  12/14/2018   Tetanus Vaccine  07/19/2026       Advance Directive  Advance directives are legal documents that let you make choices ahead of time about your health care and medical treatment in case you become unable to communicate for yourself. Advance directives are a way for you to communicate your wishes to family, friends, and health care providers. This can help convey your decisions about end-of-life care if you become unable to communicate. Discussing and writing advance directives should happen over time rather than all at once. Advance directives can be changed depending on your situation and what you want, even after you have signed the advance directives. If you do not have an advance directive, some states assign family decision makers to act on your behalf based on how closely you are related to them. Each state has its own laws regarding advance directives. You may want to check with your health care provider, attorney, or state representative about the laws in your state. There are different types of advance directives, such as:  Medical power of attorney.  Living will.  Do not resuscitate (DNR) or do not attempt resuscitation (DNAR) order. Health care proxy and medical power of attorney A health care proxy, also called a health care agent, is a person who is  appointed to make medical decisions for you in cases in which you are unable to make the decisions yourself. Generally, people choose someone they know well and trust to represent their preferences. Make sure to ask this person for an agreement to act as your proxy. A proxy may have to exercise judgment in the event of a medical decision for which your wishes are not known. A medical power of attorney is a legal document that names your health care proxy. Depending on the laws in your state, after the document is written, it may also need to be:  Signed.  Notarized.  Dated.  Copied.  Witnessed.  Incorporated into your medical record. You may also want to appoint someone to manage your financial affairs in a situation in which you are unable to do so. This is called a durable power of attorney for finances. It is a separate legal document from the durable power of attorney for health care. You may choose the same person or someone different from your health care proxy to act as your agent in financial matters. If you do not appoint a proxy, or if there is a concern that the proxy is not acting in your best interests, a court-appointed guardian may be designated to act on your behalf. Living will A living will is a set of instructions documenting your wishes about medical care when you cannot express them yourself. Health care providers should keep a  copy of your living will in your medical record. You may want to give a copy to family members or friends. To alert caregivers in case of an emergency, you can place a card in your wallet to let them know that you have a living will and where they can find it. A living will is used if you become:  Terminally ill.  Incapacitated.  Unable to communicate or make decisions. Items to consider in your living will include:  The use or non-use of life-sustaining equipment, such as dialysis machines and breathing machines (ventilators).  A DNR or DNAR  order, which is the instruction not to use cardiopulmonary resuscitation (CPR) if breathing or heartbeat stops.  The use or non-use of tube feeding.  Withholding of food and fluids.  Comfort (palliative) care when the goal becomes comfort rather than a cure.  Organ and tissue donation. A living will does not give instructions for distributing your money and property if you should pass away. It is recommended that you seek the advice of a lawyer when writing a will. Decisions about taxes, beneficiaries, and asset distribution will be legally binding. This process can relieve your family and friends of any concerns surrounding disputes or questions that may come up about the distribution of your assets. DNR or DNAR A DNR or DNAR order is a request not to have CPR in the event that your heart stops beating or you stop breathing. If a DNR or DNAR order has not been made and shared, a health care provider will try to help any patient whose heart has stopped or who has stopped breathing. If you plan to have surgery, talk with your health care provider about how your DNR or DNAR order will be followed if problems occur. Summary  Advance directives are the legal documents that allow you to make choices ahead of time about your health care and medical treatment in case you become unable to communicate for yourself.  The process of discussing and writing advance directives should happen over time. You can change the advance directives, even after you have signed them.  Advance directives include DNR or DNAR orders, living wills, and designating an agent as your medical power of attorney. This information is not intended to replace advice given to you by your health care provider. Make sure you discuss any questions you have with your health care provider. Document Released: 08/08/2007 Document Revised: 06/05/2018 Document Reviewed: 03/20/2016 Elsevier Patient Education  [place].    BMI for  Adults  Body mass index (BMI) is a number that is calculated from a person's weight and height. BMI may help to estimate how much of a person's weight is composed of fat. BMI can help identify those who may be at higher risk for certain medical problems. How is BMI used with adults? BMI is used as a screening tool to identify possible weight problems. It is used to check whether a person is obese, overweight, healthy weight, or underweight. How is BMI calculated? BMI measures your weight and compares it to your height. This can be done either in [place] ([place] or metric measurements. Note that charts are available to help you find your BMI quickly and easily without having to do these calculations yourself. To calculate your BMI in [place] (U.[place] measurements, your health care provider will: 1. Measure your weight in pounds (lb). 2. Multiply the number of pounds by 703. ? For example, for a person who weighs 180 lb, multiply that number  by 703, which equals 126,540. 3. Measure your height in inches (in). Then multiply that number by itself to get a measurement called "inches squared." ? For example, for a person who is 70 in tall, the "inches squared" measurement is 70 in x 70 in, which equals 4900 inches squared. 4. Divide the total from Step 2 (number of lb x 703) by the total from Step 3 (inches squared): 126,540  4900 = 25.8. This is your BMI. To calculate your BMI in metric measurements, your health care provider will: 1. Measure your weight in kilograms (kg). 2. Measure your height in meters (m). Then multiply that number by itself to get a measurement called "meters squared." ? For example, for a person who is 1.75 m tall, the "meters squared" measurement is 1.75 m x 1.75 m, which is equal to 3.1 meters squared. 3. Divide the number of kilograms (your weight) by the meters squared number. In this example: 70  3.1 = 22.6. This is your BMI. How is BMI interpreted? To interpret your  results, your health care provider will use BMI charts to identify whether you are underweight, normal weight, overweight, or obese. The following guidelines will be used:  Underweight: BMI less than 18.5.  Normal weight: BMI between 18.5 and 24.9.  Overweight: BMI between 25 and 29.9.  Obese: BMI of 30 and above. Please note:  Weight includes both fat and muscle, so someone with a muscular build, such as an athlete, may have a BMI that is higher than 24.9. In cases like these, BMI is not an accurate measure of body fat.  To determine if excess body fat is the cause of a BMI of 25 or higher, further assessments may need to be done by a health care provider.  BMI is usually interpreted in the same way for men and women. Why is BMI a useful tool? BMI is useful in two ways:  Identifying a weight problem that may be related to a medical condition, or that may increase the risk for medical problems.  Promoting lifestyle and diet changes in order to reach a healthy weight. Summary  Body mass index (BMI) is a number that is calculated from a person's weight and height.  BMI may help to estimate how much of a person's weight is composed of fat. BMI can help identify those who may be at higher risk for certain medical problems.  BMI can be measured using English measurements or metric measurements.  To interpret your results, your health care provider will use BMI charts to identify whether you are underweight, normal weight, overweight, or obese. This information is not intended to replace advice given to you by your health care provider. Make sure you discuss any questions you have with your health care provider. Document Released: 01/11/2004 Document Revised: 04/13/2017 Document Reviewed: 03/14/2017 Elsevier Patient Education  2020 [employer].

## 2018-11-27 ENCOUNTER — Telehealth: Payer: Self-pay | Admitting: Physician Assistant

## 2018-11-27 NOTE — Chronic Care Management (AMB) (Signed)
Chronic Care Management   Note  11/27/2018 Name: Melissa Zavala MRN: [id number] DOB: April 14, 1941  Melissa Zavala is a 78 y.o. year old female who is a primary care patient of Terald Sleeper, PA-C. I reached out to [name] by phone today in response to a referral sent by [name]'s health plan.    [name] was given information about Chronic Care Management services today including:  1. CCM service includes personalized support from designated clinical staff supervised by her physician, including individualized plan of care and coordination with other care providers 2. 24/7 contact phone numbers for assistance for urgent and routine care needs. 3. Service will only be billed when office clinical staff spend 20 minutes or more in a month to coordinate care. 4. Only one practitioner may furnish and bill the service in a calendar month. 5. The patient may stop CCM services at any time (effective at the end of the month) by phone call to the office staff. 6. The patient will be responsible for cost sharing (co-pay) of up to 20% of the service fee (after annual deductible is met).  Patient agreed to services and verbal consent obtained.   Follow up plan: Telephone appointment with CCM team member scheduled for: 12/04/2018  [place]  ??[email or web address]   ??[phone]

## 2018-12-04 ENCOUNTER — Ambulatory Visit: Payer: Medicare HMO | Admitting: *Deleted

## 2018-12-04 DIAGNOSIS — K219 Gastro-esophageal reflux disease without esophagitis: Secondary | ICD-10-CM

## 2018-12-04 DIAGNOSIS — M810 Age-related osteoporosis without current pathological fracture: Secondary | ICD-10-CM

## 2018-12-04 DIAGNOSIS — M159 Polyosteoarthritis, unspecified: Secondary | ICD-10-CM

## 2018-12-04 DIAGNOSIS — I10 Essential (primary) hypertension: Secondary | ICD-10-CM

## 2018-12-04 NOTE — Chronic Care Management (AMB) (Signed)
  Chronic Care Management   Outreach Note  12/04/2018 Name: Melissa Zavala MRN: [id number] DOB: 1941-01-15  Referred by: Terald Sleeper, PA-C Reason for referral : Chronic Care Management (RNCM Initial Outreach)  Melissa Zavala is a 78 y.o. year old female who is a primary care patient of Terald Sleeper, PA-C. An unsuccessful telephone outreach was attempted today. The patient was referred to the care management team by her health plan for assistance with chronic disease management and care coordination needs.  [name] was initially contacted on 11/27/18 regarding CCM services and consented to program participation.    Review of patient status, including review of consultants reports, relevant laboratory and other test results, and collaboration with appropriate care team members and the patient's provider was performed as part of comprehensive patient evaluation and provision of chronic care management services.    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 Her chronic medical conditions that require care management include hypertension, GERD, osteoporosis, and osteoarthritis. Additionally she had an abnormal TSH on recent lab test. This will be followed and addressed by PCP if it remains abnormal.   An unsuccessful telephone outreach was attempted today. The patient was referred to the case management team by for assistance with chronic care management and care coordination.   Follow Up Plan: The care management team will reach out to the patient again over the next 15 days.   [name] BSN, RN-BC Embedded Chronic Care Manager [facility] Direct Dial: [phone]

## 2018-12-13 ENCOUNTER — Ambulatory Visit: Payer: Medicare HMO | Admitting: Physician Assistant

## 2018-12-13 ENCOUNTER — Other Ambulatory Visit: Payer: Self-pay

## 2018-12-13 ENCOUNTER — Encounter: Payer: Self-pay | Admitting: Physician Assistant

## 2018-12-13 ENCOUNTER — Ambulatory Visit (INDEPENDENT_AMBULATORY_CARE_PROVIDER_SITE_OTHER): Payer: Medicare HMO | Admitting: Physician Assistant

## 2018-12-13 VITALS — BP 135/74 | HR 80 | Temp 96.0°F | Ht 63.0 in | Wt 199.0 lb

## 2018-12-13 DIAGNOSIS — J0110 Acute frontal sinusitis, unspecified: Secondary | ICD-10-CM | POA: Diagnosis not present

## 2018-12-13 DIAGNOSIS — I889 Nonspecific lymphadenitis, unspecified: Secondary | ICD-10-CM | POA: Diagnosis not present

## 2018-12-13 DIAGNOSIS — Z136 Encounter for screening for cardiovascular disorders: Secondary | ICD-10-CM | POA: Diagnosis not present

## 2018-12-13 DIAGNOSIS — R6889 Other general symptoms and signs: Secondary | ICD-10-CM | POA: Diagnosis not present

## 2018-12-13 DIAGNOSIS — Z0000 Encounter for general adult medical examination without abnormal findings: Secondary | ICD-10-CM | POA: Diagnosis not present

## 2018-12-13 MED ORDER — FLUCONAZOLE 150 MG PO TABS: 150.0000 mg | ORAL_TABLET | Freq: Once | ORAL | 0 refills | Status: AC

## 2018-12-13 MED ORDER — AMOXICILLIN 500 MG PO CAPS: 500.0000 mg | ORAL_CAPSULE | Freq: Three times a day (TID) | ORAL | 0 refills | Status: DC

## 2018-12-14 LAB — CBC WITH DIFFERENTIAL/PLATELET
Basophils Absolute: 0 10*3/uL (ref 0.0–0.2)
Basos: 1 %
EOS (ABSOLUTE): 0.2 10*3/uL (ref 0.0–0.4)
Eos: 3 %
Hematocrit: 35.4 % (ref 34.0–46.6)
Hemoglobin: 11.4 g/dL (ref 11.1–15.9)
Immature Grans (Abs): 0 10*3/uL (ref 0.0–0.1)
Immature Granulocytes: 0 %
Lymphocytes Absolute: 1.9 10*3/uL (ref 0.7–3.1)
Lymphs: 31 %
MCH: 26.6 pg (ref 26.6–33.0)
MCHC: 32.2 g/dL (ref 31.5–35.7)
MCV: 83 fL (ref 79–97)
Monocytes Absolute: 0.4 10*3/uL (ref 0.1–0.9)
Monocytes: 6 %
Neutrophils Absolute: 3.6 10*3/uL (ref 1.4–7.0)
Neutrophils: 59 %
Platelets: 267 10*3/uL (ref 150–450)
RBC: 4.28 x10E6/uL (ref 3.77–5.28)
RDW: 13.2 % (ref 11.7–15.4)
WBC: 6 10*3/uL (ref 3.4–10.8)

## 2018-12-14 LAB — CMP14+EGFR
ALT: 11 IU/L (ref 0–32)
AST: 15 IU/L (ref 0–40)
Albumin/Globulin Ratio: 1.6 (ref 1.2–2.2)
Albumin: 4.1 g/dL (ref 3.7–4.7)
Alkaline Phosphatase: 64 IU/L (ref 39–117)
BUN/Creatinine Ratio: 18 (ref 12–28)
BUN: 11 mg/dL (ref 8–27)
Bilirubin Total: <0.2 mg/dL (ref 0.0–1.2)
CO2: 22 mmol/L (ref 20–29)
Calcium: 9.8 mg/dL (ref 8.7–10.3)
Chloride: 105 mmol/L (ref 96–106)
Creatinine, Ser: 0.6 mg/dL (ref 0.57–1.00)
GFR calc Af Amer: 101 mL/min/{1.73_m2} (ref >59)
GFR calc non Af Amer: 88 mL/min/{1.73_m2} (ref >59)
Globulin, Total: 2.5 g/dL (ref 1.5–4.5)
Glucose: 101 mg/dL — ABNORMAL HIGH (ref 65–99)
Potassium: 4.4 mmol/L (ref 3.5–5.2)
Sodium: 142 mmol/L (ref 134–144)
Total Protein: 6.6 g/dL (ref 6.0–8.5)

## 2018-12-14 LAB — THYROID PANEL WITH TSH
Free Thyroxine Index: 1.7 (ref 1.2–4.9)
T3 Uptake Ratio: 32 % (ref 24–39)
T4, Total: 5.3 ug/dL (ref 4.5–12.0)
TSH: 0.824 u[IU]/mL (ref 0.450–4.500)

## 2018-12-14 LAB — LIPID PANEL
Chol/HDL Ratio: 2.2 ratio (ref 0.0–4.4)
Cholesterol, Total: 204 mg/dL — ABNORMAL HIGH (ref 100–199)
HDL: 93 mg/dL (ref >39)
LDL Calculated: 99 mg/dL (ref 0–99)
Triglycerides: 59 mg/dL (ref 0–149)
VLDL Cholesterol Cal: 12 mg/dL (ref 5–40)

## 2018-12-15 NOTE — Progress Notes (Signed)
BP 135/74   Pulse 80   Temp (!) 96 F (35.6 C) (Axillary)   Ht 5' 3"  (1.6 m)   Wt 199 lb (90.3 kg)   BMI 35.25 kg/m    Subjective:    Patient ID: Melissa Zavala, female    DOB: 1940/05/22, 78 y.o.   MRN: [id number]  HPI: Melissa Zavala is a 78 y.o. female presenting on 12/13/2018 for Hypertension, Medical Management of Chronic Issues, Sinus Problem, and Edema  This patient comes in for follow-up on her chronic medical conditions which do include hypertension, osteoarthritis, GERD, menopause, neuropathy, recurrent sinusitis, persistent left lymphadenitis.  Over the past few months she has been treated for cervical lymphadenopathy.  She has even had one episode where her anterior neck was quite swollen.  Antibiotics did help it to heal.  Sometimes she will still feel a persistent lymph node on the left anterior cervical chain.  She is having constant sinus drainage and pain in her cheeks.  She denies seeing any blood, no fever, no chills.  Past Medical History:  Diagnosis Date  . Arthritis   . Hypertension    Relevant past medical, surgical, family and social history reviewed and updated as indicated. Interim medical history since our last visit reviewed. Allergies and medications reviewed and updated. DATA REVIEWED: CHART IN EPIC  Family History reviewed for pertinent findings.  Review of Systems  Constitutional: Positive for fatigue. Negative for activity change, appetite change and chills.  HENT: Positive for congestion and postnasal drip. Negative for facial swelling and sore throat.   Eyes: Negative.   Respiratory: Negative for cough and wheezing.   Cardiovascular: Negative.  Negative for chest pain, palpitations and leg swelling.  Gastrointestinal: Negative.   Genitourinary: Negative.   Musculoskeletal: Negative.   Skin: Negative.   Neurological: Positive for headaches.    Allergies as of 12/13/2018      Reactions   Iodinated Diagnostic Agents Anaphylaxis   Codeine  Other (See Comments)   Hallucinations   Diphenhydramine Other (See Comments)   Patient states "feels like somebody is inside of me clawing me out"      Medication List       Accurate as of December 13, 2018 11:59 PM. If you have any questions, ask your nurse or doctor.        amLODipine 10 MG tablet Commonly known as: NORVASC Take 1 tablet (10 mg total) by mouth daily.   amoxicillin 500 MG capsule Commonly known as: AMOXIL Take 1 capsule (500 mg total) by mouth 3 (three) times daily. Started by: Terald Sleeper, PA-C   celecoxib 200 MG capsule Commonly known as: CELEBREX Take 1 capsule (200 mg total) by mouth daily.   conjugated estrogens vaginal cream Commonly known as: PREMARIN Place 1 Applicatorful vaginally daily.   esomeprazole 40 MG capsule Commonly known as: NEXIUM TAKE (1) CAPSULE DAILY.   fluconazole 150 MG tablet Commonly known as: Diflucan Take 1 tablet (150 mg total) by mouth once for 1 dose. What changed:   how much to take  how to take this  when to take this  additional instructions Changed by: Terald Sleeper, PA-C   furosemide 20 MG tablet Commonly known as: LASIX TAKE ONE TABLET DAILY. WHEN LOTS OF SWELLING.   gabapentin 100 MG capsule Commonly known as: NEURONTIN Take 1 capsule (100 mg total) by mouth 3 (three) times daily.   lisinopril 30 MG tablet Commonly known as: ZESTRIL Take 1 tablet (30 mg total) by  mouth daily.   meclizine 25 MG tablet Commonly known as: ANTIVERT Take 1 tablet (25 mg total) by mouth 3 (three) times daily as needed for dizziness.   nystatin powder Commonly known as: MYCOSTATIN/NYSTOP Apply topically 4 (four) times daily.   potassium chloride 10 MEQ tablet Commonly known as: K-DUR Take 1 tablet (10 mEq total) by mouth daily.   traMADol 50 MG tablet Commonly known as: ULTRAM Take 2 tablets (100 mg total) by mouth every 6 (six) hours as needed.          Objective:    BP 135/74   Pulse 80   Temp (!) 96  F (35.6 C) (Axillary)   Ht 5' 3"  (1.6 m)   Wt 199 lb (90.3 kg)   BMI 35.25 kg/m   Allergies  Allergen Reactions  . Iodinated Diagnostic Agents Anaphylaxis  . Codeine Other (See Comments)    Hallucinations  . Diphenhydramine Other (See Comments)    Patient states "feels like somebody is inside of me clawing me out"    Wt Readings from Last 3 Encounters:  12/13/18 199 lb (90.3 kg)  11/19/18 198 lb 3.2 oz (89.9 kg)  08/12/18 198 lb 3.2 oz (89.9 kg)    Physical Exam Constitutional:      Appearance: She is well-developed.  HENT:     Head: Normocephalic and atraumatic.     Right Ear: Tympanic membrane and ear canal normal.     Left Ear: Tympanic membrane and ear canal normal.     Nose:     Right Sinus: Maxillary sinus tenderness present.     Left Sinus: Maxillary sinus tenderness present.  Eyes:     Conjunctiva/sclera: Conjunctivae normal.     Pupils: Pupils are equal, round, and reactive to light.  Neck:     Musculoskeletal: Normal range of motion.     Thyroid: No thyroid mass, thyromegaly or thyroid tenderness.   Cardiovascular:     Rate and Rhythm: Normal rate and regular rhythm.     Heart sounds: Normal heart sounds.  Pulmonary:     Effort: Pulmonary effort is normal.     Breath sounds: Normal breath sounds.  Abdominal:     General: Bowel sounds are normal.     Palpations: Abdomen is soft.  Lymphadenopathy:     Cervical: Cervical adenopathy present.  Skin:    General: Skin is warm and dry.     Findings: No rash.  Neurological:     Mental Status: She is alert and oriented to person, place, and time.     Deep Tendon Reflexes: Reflexes are normal and symmetric.  Psychiatric:        Behavior: Behavior normal.        Thought Content: Thought content normal.        Judgment: Judgment normal.     Results for orders placed or performed in visit on 12/13/18  CBC with Differential/Platelet  Result Value Ref Range   WBC 6.0 3.4 - 10.8 x10E3/uL   RBC 4.28 3.77 -  5.28 x10E6/uL   Hemoglobin 11.4 11.1 - 15.9 g/dL   Hematocrit 35.4 34.0 - 46.6 %   MCV 83 79 - 97 fL   MCH 26.6 26.6 - 33.0 pg   MCHC 32.2 31.5 - 35.7 g/dL   RDW 13.2 11.7 - 15.4 %   Platelets 267 150 - 450 x10E3/uL   Neutrophils 59 Not Estab. %   Lymphs 31 Not Estab. %   Monocytes 6 Not Estab. %  Eos 3 Not Estab. %   Basos 1 Not Estab. %   Neutrophils Absolute 3.6 1.4 - 7.0 x10E3/uL   Lymphocytes Absolute 1.9 0.7 - 3.1 x10E3/uL   Monocytes Absolute 0.4 0.1 - 0.9 x10E3/uL   EOS (ABSOLUTE) 0.2 0.0 - 0.4 x10E3/uL   Basophils Absolute 0.0 0.0 - 0.2 x10E3/uL   Immature Granulocytes 0 Not Estab. %   Immature Grans (Abs) 0.0 0.0 - 0.1 x10E3/uL  CMP14+EGFR  Result Value Ref Range   Glucose 101 (H) 65 - 99 mg/dL   BUN 11 8 - 27 mg/dL   Creatinine, Ser 0.60 0.57 - 1.00 mg/dL   GFR calc non Af Amer 88 >59 mL/min/1.73   GFR calc Af Amer 101 >59 mL/min/1.73   BUN/Creatinine Ratio 18 12 - 28   Sodium 142 134 - 144 mmol/L   Potassium 4.4 3.5 - 5.2 mmol/L   Chloride 105 96 - 106 mmol/L   CO2 22 20 - 29 mmol/L   Calcium 9.8 8.7 - 10.3 mg/dL   Total Protein 6.6 6.0 - 8.5 g/dL   Albumin 4.1 3.7 - 4.7 g/dL   Globulin, Total 2.5 1.5 - 4.5 g/dL   Albumin/Globulin Ratio 1.6 1.2 - 2.2   Bilirubin Total <0.2 0.0 - 1.2 mg/dL   Alkaline Phosphatase 64 39 - 117 IU/L   AST 15 0 - 40 IU/L   ALT 11 0 - 32 IU/L  Lipid panel  Result Value Ref Range   Cholesterol, Total 204 (H) 100 - 199 mg/dL   Triglycerides 59 0 - 149 mg/dL   HDL 93 >39 mg/dL   VLDL Cholesterol Cal 12 5 - 40 mg/dL   LDL Calculated 99 0 - 99 mg/dL   Chol/HDL Ratio 2.2 0.0 - 4.4 ratio  Thyroid Panel With TSH  Result Value Ref Range   TSH 0.824 0.450 - 4.500 uIU/mL   T4, Total 5.3 4.5 - 12.0 ug/dL   T3 Uptake Ratio 32 24 - 39 %   Free Thyroxine Index 1.7 1.2 - 4.9      Assessment & Plan:   1. Well adult exam - CBC with Differential/Platelet - CMP14+EGFR - Lipid panel - Thyroid Panel With TSH  2. Lymphadenitis -  Ambulatory referral to ENT  3. Acute non-recurrent frontal sinusitis - amoxicillin (AMOXIL) 500 MG capsule; Take 1 capsule (500 mg total) by mouth 3 (three) times daily.  Dispense: 30 capsule; Refill: 0 - fluconazole (DIFLUCAN) 150 MG tablet; Take 1 tablet (150 mg total) by mouth once for 1 dose.  Dispense: 1 tablet; Refill: 0   Continue all other maintenance medications as listed above.  Follow up plan: Return in about 6 months (around 06/15/2019) for next monday PM B12 injection.  Educational handout given for Williams PA-C [place] [address]  [place], [place] [address] [phone]   12/15/2018, 8:27 PM

## 2018-12-16 ENCOUNTER — Other Ambulatory Visit: Payer: Self-pay

## 2018-12-16 ENCOUNTER — Ambulatory Visit (INDEPENDENT_AMBULATORY_CARE_PROVIDER_SITE_OTHER): Payer: Medicare HMO | Admitting: *Deleted

## 2018-12-16 DIAGNOSIS — E538 Deficiency of other specified B group vitamins: Secondary | ICD-10-CM

## 2018-12-16 NOTE — Progress Notes (Signed)
Pt given Cyanocobalamin inj Tolerated well 

## 2018-12-20 ENCOUNTER — Other Ambulatory Visit: Payer: Self-pay | Admitting: Physician Assistant

## 2018-12-20 DIAGNOSIS — R609 Edema, unspecified: Secondary | ICD-10-CM

## 2018-12-20 DIAGNOSIS — M159 Polyosteoarthritis, unspecified: Secondary | ICD-10-CM

## 2018-12-26 ENCOUNTER — Other Ambulatory Visit: Payer: Self-pay

## 2018-12-26 ENCOUNTER — Encounter: Payer: Self-pay | Admitting: Family

## 2018-12-26 ENCOUNTER — Ambulatory Visit (INDEPENDENT_AMBULATORY_CARE_PROVIDER_SITE_OTHER): Payer: Medicare HMO | Admitting: Family

## 2018-12-26 DIAGNOSIS — L239 Allergic contact dermatitis, unspecified cause: Secondary | ICD-10-CM | POA: Diagnosis not present

## 2018-12-26 DIAGNOSIS — B880 Other acariasis: Secondary | ICD-10-CM

## 2018-12-26 MED ORDER — TRIAMCINOLONE ACETONIDE 0.1 % EX CREA: 1.0000 "application " | TOPICAL_CREAM | Freq: Two times a day (BID) | CUTANEOUS | 0 refills | Status: DC

## 2018-12-26 NOTE — Progress Notes (Signed)
   Virtual Visit via telephone Note Due to COVID-19 pandemic this visit was conducted virtually. This visit type was conducted due to national recommendations for restrictions regarding the COVID-19 Pandemic (e.g. social distancing, sheltering in place) in an effort to limit this patient's exposure and mitigate transmission in our community. All issues noted in this document were discussed and addressed.  A physical exam was not performed with this format.  I connected with Melissa Zavala on 12/26/18 at 11:28 AM by telephone and verified that I am speaking with the correct person using two identifiers. Melissa Zavala is currently located at home and no one is currently with her during visit. The provider, Evelina Dun, FNP is located in their office at time of visit.  I discussed the limitations, risks, security and privacy concerns of performing an evaluation and management service by telephone and the availability of in person appointments. I also discussed with the patient that there may be a patient responsible charge related to this service. The patient expressed understanding and agreed to proceed.   History and Present Illness:  Pt calls the office today with a rash that started yesterday on her back. She states they "look like chigger bites". She has been in her flower garden everyday.  Rash This is a new problem. The current episode started in the past 7 days. The problem is unchanged. The affected locations include the back. The rash is characterized by itchiness. Associated with: outside in her flower garden. Pertinent negatives include no congestion, cough, diarrhea, fever, joint pain or vomiting. Past treatments include anti-itch cream (alcohol ). The treatment provided no relief.      Review of Systems  Constitutional: Negative for fever.  HENT: Negative for congestion.   Respiratory: Negative for cough.   Gastrointestinal: Negative for diarrhea and vomiting.  Musculoskeletal:  Negative for joint pain.  Skin: Positive for rash.  All other systems reviewed and are negative.    Observations/Objective: No SOB or distress noted   Assessment and Plan: 1. Chigger bite Do not scratch Keep clean and dry Apply Kenalog BID, do not apply to face or groin Call office if symptoms worsen or do not improve  - triamcinolone cream (KENALOG) 0.1 %; Apply 1 application topically 2 (two) times daily.  Dispense: 30 g; Refill: 0     I discussed the assessment and treatment plan with the patient. The patient was provided an opportunity to ask questions and all were answered. The patient agreed with the plan and demonstrated an understanding of the instructions.   The patient was advised to call back or seek an in-person evaluation if the symptoms worsen or if the condition fails to improve as anticipated.  The above assessment and management plan was discussed with the patient. The patient verbalized understanding of and has agreed to the management plan. Patient is aware to call the clinic if symptoms persist or worsen. Patient is aware when to return to the clinic for a follow-up visit. Patient educated on when it is appropriate to go to the emergency department.   Time call ended:  11:38 AM   I provided 10 minutes of non-face-to-face time during this encounter.    Evelina Dun, FNP

## 2019-01-16 ENCOUNTER — Other Ambulatory Visit: Payer: Self-pay

## 2019-01-16 ENCOUNTER — Ambulatory Visit (INDEPENDENT_AMBULATORY_CARE_PROVIDER_SITE_OTHER): Payer: Medicare HMO | Admitting: *Deleted

## 2019-01-16 DIAGNOSIS — E538 Deficiency of other specified B group vitamins: Secondary | ICD-10-CM

## 2019-01-16 DIAGNOSIS — Z23 Encounter for immunization: Secondary | ICD-10-CM

## 2019-01-16 MED ORDER — CYANOCOBALAMIN 1000 MCG/ML IJ SOLN
1000.0000 ug | INTRAMUSCULAR | Status: AC
  Administered 2019-01-16 – 2019-09-02 (×7): 1000 ug via INTRAMUSCULAR

## 2019-01-16 NOTE — Patient Instructions (Signed)
Influenza Virus Vaccine injection What is this medicine? INFLUENZA VIRUS VACCINE (in floo EN zuh VAHY ruhs vak SEEN) helps to reduce the risk of getting influenza also known as the flu. The vaccine only helps protect you against some strains of the flu. This medicine may be used for other purposes; ask your health care provider or pharmacist if you have questions. COMMON BRAND NAME(S): Afluria, Afluria Quadrivalent, Agriflu, Alfuria, FLUAD, Fluarix, Fluarix Quadrivalent, Flublok, Flublok Quadrivalent, FLUCELVAX, Flulaval, Fluvirin, Fluzone, Fluzone High-Dose, Fluzone Intradermal What should I tell my health care provider before I take this medicine? They need to know if you have any of these conditions:  bleeding disorder like hemophilia  fever or infection  Guillain-Barre syndrome or other neurological problems  immune system problems  infection with the human immunodeficiency virus (HIV) or AIDS  low blood platelet counts  multiple sclerosis  an unusual or allergic reaction to influenza virus vaccine, latex, other medicines, foods, dyes, or preservatives. Different brands of vaccines contain different allergens. Some may contain latex or eggs. Talk to your doctor about your allergies to make sure that you get the right vaccine.  pregnant or trying to get pregnant  breast-feeding How should I use this medicine? This vaccine is for injection into a muscle or under the skin. It is given by a health care professional. A copy of Vaccine Information Statements will be given before each vaccination. Read this sheet carefully each time. The sheet may change frequently. Talk to your healthcare provider to see which vaccines are right for you. Some vaccines should not be used in all age groups. Overdosage: If you think you have taken too much of this medicine contact a poison control center or emergency room at once. NOTE: This medicine is only for you. Do not share this medicine with  others. What if I miss a dose? This does not apply. What may interact with this medicine?  chemotherapy or radiation therapy  medicines that lower your immune system like etanercept, anakinra, infliximab, and adalimumab  medicines that treat or prevent blood clots like warfarin  phenytoin  steroid medicines like prednisone or cortisone  theophylline  vaccines This list may not describe all possible interactions. Give your health care provider a list of all the medicines, herbs, non-prescription drugs, or dietary supplements you use. Also tell them if you smoke, drink alcohol, or use illegal drugs. Some items may interact with your medicine. What should I watch for while using this medicine? Report any side effects that do not go away within 3 days to your doctor or health care professional. Call your health care provider if any unusual symptoms occur within 6 weeks of receiving this vaccine. You may still catch the flu, but the illness is not usually as bad. You cannot get the flu from the vaccine. The vaccine will not protect against colds or other illnesses that may cause fever. The vaccine is needed every year. What side effects may I notice from receiving this medicine? Side effects that you should report to your doctor or health care professional as soon as possible:  allergic reactions like skin rash, itching or hives, swelling of the face, lips, or tongue Side effects that usually do not require medical attention (report to your doctor or health care professional if they continue or are bothersome):  fever  headache  muscle aches and pains  pain, tenderness, redness, or swelling at the injection site  tiredness This list may not describe all possible side effects. Call your   doctor for medical advice about side effects. You may report side effects to FDA at 1-800-FDA-1088. Where should I keep my medicine? The vaccine will be given by a health care professional in a  clinic, pharmacy, doctor's office, or other health care setting. You will not be given vaccine doses to store at home. NOTE: This sheet is a summary. It may not cover all possible information. If you have questions about this medicine, talk to your doctor, pharmacist, or health care provider.  2020 Elsevier/Gold Standard (2018-03-26 08:45:43)  

## 2019-01-16 NOTE — Progress Notes (Signed)
Pt given Cyanocobalamin inj and flu vaccine Tolerated well

## 2019-01-18 ENCOUNTER — Other Ambulatory Visit: Payer: Self-pay | Admitting: Physician Assistant

## 2019-01-18 DIAGNOSIS — M159 Polyosteoarthritis, unspecified: Secondary | ICD-10-CM

## 2019-01-23 ENCOUNTER — Telehealth: Payer: Self-pay | Admitting: Physician Assistant

## 2019-01-30 ENCOUNTER — Ambulatory Visit (INDEPENDENT_AMBULATORY_CARE_PROVIDER_SITE_OTHER): Payer: Medicare HMO | Admitting: Otolaryngology

## 2019-01-30 DIAGNOSIS — R590 Localized enlarged lymph nodes: Secondary | ICD-10-CM

## 2019-01-30 DIAGNOSIS — H6122 Impacted cerumen, left ear: Secondary | ICD-10-CM

## 2019-02-01 ENCOUNTER — Other Ambulatory Visit: Payer: Self-pay | Admitting: Physician Assistant

## 2019-02-01 DIAGNOSIS — M159 Polyosteoarthritis, unspecified: Secondary | ICD-10-CM

## 2019-02-03 ENCOUNTER — Encounter: Payer: Self-pay | Admitting: Physician Assistant

## 2019-02-03 ENCOUNTER — Other Ambulatory Visit: Payer: Self-pay

## 2019-02-03 ENCOUNTER — Ambulatory Visit (INDEPENDENT_AMBULATORY_CARE_PROVIDER_SITE_OTHER): Payer: Medicare HMO | Admitting: Physician Assistant

## 2019-02-03 DIAGNOSIS — M150 Primary generalized (osteo)arthritis: Secondary | ICD-10-CM | POA: Diagnosis not present

## 2019-02-03 DIAGNOSIS — R609 Edema, unspecified: Secondary | ICD-10-CM | POA: Diagnosis not present

## 2019-02-03 DIAGNOSIS — M159 Polyosteoarthritis, unspecified: Secondary | ICD-10-CM | POA: Diagnosis not present

## 2019-02-03 DIAGNOSIS — K219 Gastro-esophageal reflux disease without esophagitis: Secondary | ICD-10-CM

## 2019-02-03 DIAGNOSIS — I10 Essential (primary) hypertension: Secondary | ICD-10-CM | POA: Diagnosis not present

## 2019-02-03 MED ORDER — ESOMEPRAZOLE MAGNESIUM 40 MG PO CPDR: DELAYED_RELEASE_CAPSULE | ORAL | 11 refills | Status: DC

## 2019-02-03 MED ORDER — FUROSEMIDE 20 MG PO TABS: ORAL_TABLET | ORAL | 5 refills | Status: DC

## 2019-02-03 MED ORDER — TRAMADOL HCL 50 MG PO TABS: 100.0000 mg | ORAL_TABLET | Freq: Four times a day (QID) | ORAL | 5 refills | Status: DC | PRN

## 2019-02-03 MED ORDER — POTASSIUM CHLORIDE ER 10 MEQ PO TBCR: 10.0000 meq | EXTENDED_RELEASE_TABLET | Freq: Every day | ORAL | 11 refills | Status: DC

## 2019-02-03 MED ORDER — GABAPENTIN 100 MG PO CAPS: 100.0000 mg | ORAL_CAPSULE | Freq: Three times a day (TID) | ORAL | 5 refills | Status: DC

## 2019-02-03 MED ORDER — AMLODIPINE BESYLATE 10 MG PO TABS: 10.0000 mg | ORAL_TABLET | Freq: Every day | ORAL | 11 refills | Status: DC

## 2019-02-03 NOTE — Progress Notes (Signed)
BP 132/66   Pulse 80   Temp (!) 96.6 F (35.9 C) (Temporal)   Ht _0  (1.6 m)   Wt 209 lb (94.8 kg)   SpO2 97%   BMI 37.02 kg/m    Subjective:    Patient ID: Melissa Zavala, female    DOB: 1941-01-29, 78 y.o.   MRN: [id number]  HPI: Melissa Zavala is a 78 y.o. female presenting on 02/03/2019 for Medication Refill and Hip and leg pain  This patient comes in due to multiple joint pain and swelling.  He has had over a lot of stiffness in the hips, knees, hands, wrists.  She notes that she has been off of her Celebrex for greater than 1 month.  She did not know that she would hurt this much because of not taking the medication.  In addition she had stopped her Neurontin and I have instructed her to try to use it to see if this will keep her from having pain in the future.  She denies having any injury or any specific activity that caused this increase.  Past Medical History:  Diagnosis Date  . Arthritis   . Hypertension    Relevant past medical, surgical, family and social history reviewed and updated as indicated. Interim medical history since our last visit reviewed. Allergies and medications reviewed and updated. DATA REVIEWED: CHART IN EPIC  Family History reviewed for pertinent findings.  Review of Systems  Constitutional: Positive for fever.  HENT: Negative.   Eyes: Negative.   Respiratory: Negative.   Cardiovascular: Positive for leg swelling.  Gastrointestinal: Negative.   Genitourinary: Negative.   Musculoskeletal: Positive for arthralgias, joint swelling and myalgias.    Allergies as of 02/03/2019      Reactions   Iodinated Diagnostic Agents Anaphylaxis   Codeine Other (See Comments)   Hallucinations   Diphenhydramine Other (See Comments)   Patient states "feels like somebody is inside of me clawing me out"      Medication List       Accurate as of February 03, 2019  8:56 PM. If you have any questions, ask your nurse or doctor.        STOP taking  these medications   amoxicillin 500 MG capsule Commonly known as: AMOXIL Stopped by: Terald Sleeper, PA-C   meclizine 25 MG tablet Commonly known as: ANTIVERT Stopped by: Terald Sleeper, PA-C     TAKE these medications   amLODipine 10 MG tablet Commonly known as: NORVASC Take 1 tablet (10 mg total) by mouth daily.   celecoxib 200 MG capsule Commonly known as: CELEBREX TAKE 1 CAPSULE BY MOUTH EVERY DAY   conjugated estrogens vaginal cream Commonly known as: PREMARIN Place 1 Applicatorful vaginally daily.   esomeprazole 40 MG capsule Commonly known as: NEXIUM TAKE (1) CAPSULE DAILY.   furosemide 20 MG tablet Commonly known as: LASIX TAKE 1 TABLET BY MOUTH EVERY DAY WHEN LOTS OF SWELLING   gabapentin 100 MG capsule Commonly known as: NEURONTIN Take 1 capsule (100 mg total) by mouth 3 (three) times daily.   lisinopril 30 MG tablet Commonly known as: ZESTRIL Take 1 tablet (30 mg total) by mouth daily.   nystatin powder Commonly known as: MYCOSTATIN/NYSTOP Apply topically 4 (four) times daily.   potassium chloride 10 MEQ tablet Commonly known as: K-DUR Take 1 tablet (10 mEq total) by mouth daily.   traMADol 50 MG tablet Commonly known as: ULTRAM Take 2 tablets (100 mg total) by mouth every  6 (six) hours as needed.   triamcinolone cream 0.1 % Commonly known as: KENALOG Apply 1 application topically 2 (two) times daily.          Objective:    BP 132/66   Pulse 80   Temp (!) 96.6 F (35.9 C) (Temporal)   Ht _0  (1.6 m)   Wt 209 lb (94.8 kg)   SpO2 97%   BMI 37.02 kg/m   Allergies  Allergen Reactions  . Iodinated Diagnostic Agents Anaphylaxis  . Codeine Other (See Comments)    Hallucinations  . Diphenhydramine Other (See Comments)    Patient states "feels like somebody is inside of me clawing me out"    Wt Readings from Last 3 Encounters:  02/03/19 209 lb (94.8 kg)  12/13/18 199 lb (90.3 kg)  11/19/18 198 lb 3.2 oz (89.9 kg)    Physical Exam  Constitutional:      General: She is not in acute distress.    Appearance: Normal appearance. She is well-developed.  HENT:     Head: Normocephalic and atraumatic.  Cardiovascular:     Rate and Rhythm: Normal rate.  Pulmonary:     Effort: Pulmonary effort is normal.  Musculoskeletal:     Right hip: She exhibits decreased range of motion and decreased strength.     Left hip: She exhibits decreased range of motion and decreased strength.     Right hand: She exhibits decreased range of motion and tenderness.     Left hand: She exhibits decreased range of motion and tenderness.  Skin:    General: Skin is warm and dry.     Findings: No rash.  Neurological:     Mental Status: She is alert and oriented to person, place, and time.     Deep Tendon Reflexes: Reflexes are normal and symmetric.     Results for orders placed or performed in visit on 12/13/18  CBC with Differential/Platelet  Result Value Ref Range   WBC 6.0 3.4 - 10.8 x10E3/uL   RBC 4.28 3.77 - 5.28 x10E6/uL   Hemoglobin 11.4 11.1 - 15.9 g/dL   Hematocrit 35.4 34.0 - 46.6 %   MCV 83 79 - 97 fL   MCH 26.6 26.6 - 33.0 pg   MCHC 32.2 31.5 - 35.7 g/dL   RDW 13.2 11.7 - 15.4 %   Platelets 267 150 - 450 x10E3/uL   Neutrophils 59 Not Estab. %   Lymphs 31 Not Estab. %   Monocytes 6 Not Estab. %   Eos 3 Not Estab. %   Basos 1 Not Estab. %   Neutrophils Absolute 3.6 1.4 - 7.0 x10E3/uL   Lymphocytes Absolute 1.9 0.7 - 3.1 x10E3/uL   Monocytes Absolute 0.4 0.1 - 0.9 x10E3/uL   EOS (ABSOLUTE) 0.2 0.0 - 0.4 x10E3/uL   Basophils Absolute 0.0 0.0 - 0.2 x10E3/uL   Immature Granulocytes 0 Not Estab. %   Immature Grans (Abs) 0.0 0.0 - 0.1 x10E3/uL  CMP14+EGFR  Result Value Ref Range   Glucose 101 (H) 65 - 99 mg/dL   BUN 11 8 - 27 mg/dL   Creatinine, Ser 0.60 0.57 - 1.00 mg/dL   GFR calc non Af Amer 88 >59 mL/min/1.73   GFR calc Af Amer 101 >59 mL/min/1.73   BUN/Creatinine Ratio 18 12 - 28   Sodium 142 134 - 144 mmol/L    Potassium 4.4 3.5 - 5.2 mmol/L   Chloride 105 96 - 106 mmol/L   CO2 22 20 - 29 mmol/L  Calcium 9.8 8.7 - 10.3 mg/dL   Total Protein 6.6 6.0 - 8.5 g/dL   Albumin 4.1 3.7 - 4.7 g/dL   Globulin, Total 2.5 1.5 - 4.5 g/dL   Albumin/Globulin Ratio 1.6 1.2 - 2.2   Bilirubin Total <0.2 0.0 - 1.2 mg/dL   Alkaline Phosphatase 64 39 - 117 IU/L   AST 15 0 - 40 IU/L   ALT 11 0 - 32 IU/L  Lipid panel  Result Value Ref Range   Cholesterol, Total 204 (H) 100 - 199 mg/dL   Triglycerides 59 0 - 149 mg/dL   HDL 93 >39 mg/dL   VLDL Cholesterol Cal 12 5 - 40 mg/dL   LDL Calculated 99 0 - 99 mg/dL   Chol/HDL Ratio 2.2 0.0 - 4.4 ratio  Thyroid Panel With TSH  Result Value Ref Range   TSH 0.824 0.450 - 4.500 uIU/mL   T4, Total 5.3 4.5 - 12.0 ug/dL   T3 Uptake Ratio 32 24 - 39 %   Free Thyroxine Index 1.7 1.2 - 4.9      Assessment & Plan:   1. Primary osteoarthritis involving multiple joints - traMADol (ULTRAM) 50 MG tablet; Take 2 tablets (100 mg total) by mouth every 6 (six) hours as needed.  Dispense: 120 tablet; Refill: 5  2. Generalized OA - traMADol (ULTRAM) 50 MG tablet; Take 2 tablets (100 mg total) by mouth every 6 (six) hours as needed.  Dispense: 120 tablet; Refill: 5 - gabapentin (NEURONTIN) 100 MG capsule; Take 1 capsule (100 mg total) by mouth 3 (three) times daily.  Dispense: 90 capsule; Refill: 5  3. Essential hypertension - potassium chloride (K-DUR) 10 MEQ tablet; Take 1 tablet (10 mEq total) by mouth daily.  Dispense: 30 tablet; Refill: 11 - amLODipine (NORVASC) 10 MG tablet; Take 1 tablet (10 mg total) by mouth daily.  Dispense: 30 tablet; Refill: 11  4. Gastroesophageal reflux disease without esophagitis - esomeprazole (NEXIUM) 40 MG capsule; TAKE (1) CAPSULE DAILY.  Dispense: 30 capsule; Refill: 11  5. Edema, unspecified type - furosemide (LASIX) 20 MG tablet; TAKE 1 TABLET BY MOUTH EVERY DAY WHEN LOTS OF SWELLING  Dispense: 30 tablet; Refill: 5   Continue all other  maintenance medications as listed above.  Follow up plan: Return in about 6 months (around 08/03/2019).  Educational handout given for [place] PA-C [place] [address]  [place], [place] [address] [phone]   02/03/2019, 8:56 PM

## 2019-02-09 DIAGNOSIS — Z6841 Body Mass Index (BMI) 40.0 and over, adult: Secondary | ICD-10-CM | POA: Diagnosis not present

## 2019-02-09 DIAGNOSIS — R52 Pain, unspecified: Secondary | ICD-10-CM | POA: Diagnosis not present

## 2019-02-17 ENCOUNTER — Other Ambulatory Visit: Payer: Self-pay

## 2019-02-17 ENCOUNTER — Ambulatory Visit (INDEPENDENT_AMBULATORY_CARE_PROVIDER_SITE_OTHER): Payer: Medicare HMO

## 2019-02-17 DIAGNOSIS — E538 Deficiency of other specified B group vitamins: Secondary | ICD-10-CM | POA: Diagnosis not present

## 2019-02-17 DIAGNOSIS — Z23 Encounter for immunization: Secondary | ICD-10-CM

## 2019-02-17 NOTE — Progress Notes (Signed)
Cyanocobalamin injection given to left deltoid.  Patient tolerated well. 

## 2019-03-17 ENCOUNTER — Other Ambulatory Visit: Payer: Self-pay

## 2019-03-17 ENCOUNTER — Ambulatory Visit (INDEPENDENT_AMBULATORY_CARE_PROVIDER_SITE_OTHER): Payer: Medicare HMO | Admitting: *Deleted

## 2019-03-17 DIAGNOSIS — E538 Deficiency of other specified B group vitamins: Secondary | ICD-10-CM

## 2019-03-20 ENCOUNTER — Other Ambulatory Visit: Payer: Self-pay | Admitting: Physician Assistant

## 2019-03-20 DIAGNOSIS — I10 Essential (primary) hypertension: Secondary | ICD-10-CM

## 2019-03-22 DIAGNOSIS — R0781 Pleurodynia: Secondary | ICD-10-CM | POA: Diagnosis not present

## 2019-03-22 DIAGNOSIS — M25512 Pain in left shoulder: Secondary | ICD-10-CM | POA: Diagnosis not present

## 2019-03-22 DIAGNOSIS — R071 Chest pain on breathing: Secondary | ICD-10-CM | POA: Diagnosis not present

## 2019-03-24 ENCOUNTER — Telehealth: Payer: Self-pay | Admitting: Physician Assistant

## 2019-03-24 DIAGNOSIS — I10 Essential (primary) hypertension: Secondary | ICD-10-CM

## 2019-03-24 MED ORDER — LISINOPRIL 30 MG PO TABS: 30.0000 mg | ORAL_TABLET | Freq: Every day | ORAL | 11 refills | Status: DC

## 2019-03-24 NOTE — Telephone Encounter (Signed)
Patient aware and verbalized understanding. °

## 2019-04-07 ENCOUNTER — Ambulatory Visit (INDEPENDENT_AMBULATORY_CARE_PROVIDER_SITE_OTHER): Payer: Medicare HMO | Admitting: Family Medicine

## 2019-04-07 ENCOUNTER — Other Ambulatory Visit: Payer: Self-pay

## 2019-04-07 DIAGNOSIS — Z7189 Other specified counseling: Secondary | ICD-10-CM

## 2019-04-07 DIAGNOSIS — J069 Acute upper respiratory infection, unspecified: Secondary | ICD-10-CM | POA: Diagnosis not present

## 2019-04-07 MED ORDER — CEFDINIR 300 MG PO CAPS: 300.0000 mg | ORAL_CAPSULE | Freq: Two times a day (BID) | ORAL | 0 refills | Status: DC

## 2019-04-07 NOTE — Progress Notes (Signed)
Telephone visit  Subjective: CC: URI PCP: Terald Sleeper, PA-Melissa JC:[id number] Melissa Zavala is a 78 y.o. female calls for telephone consult today. Patient provides verbal consent for consult held via phone.  Location of patient: home Location of provider: [facility] Others present for call: none  1. URI Patient reports she has a chest cold that developed 2 days ago.  She reports soreness in her chest.  She reports wheezing and productive clear cough.  She has been gargling with warm salt water which does loosen phlegm.  She denies fever.  She reports ankle edema.  She reports this feels similar to when she had pneumonia.  She is a nonsmoker.     ROS: Per HPI  Allergies  Allergen Reactions  . Iodinated Diagnostic Agents Anaphylaxis  . Codeine Other (See Comments)    Hallucinations  . Diphenhydramine Other (See Comments)    Patient states "feels like somebody is inside of me clawing me out"   Past Medical History:  Diagnosis Date  . Arthritis   . Hypertension     Current Outpatient Medications:  .  amLODipine (NORVASC) 10 MG tablet, Take 1 tablet (10 mg total) by mouth daily., Disp: 30 tablet, Rfl: 11 .  celecoxib (CELEBREX) 200 MG capsule, TAKE 1 CAPSULE BY MOUTH EVERY DAY, Disp: 30 capsule, Rfl: 5 .  conjugated estrogens (PREMARIN) vaginal cream, Place 1 Applicatorful vaginally daily., Disp: 42.5 g, Rfl: 12 .  esomeprazole (NEXIUM) 40 MG capsule, TAKE (1) CAPSULE DAILY., Disp: 30 capsule, Rfl: 11 .  furosemide (LASIX) 20 MG tablet, TAKE 1 TABLET BY MOUTH EVERY DAY WHEN LOTS OF SWELLING, Disp: 30 tablet, Rfl: 5 .  gabapentin (NEURONTIN) 100 MG capsule, Take 1 capsule (100 mg total) by mouth 3 (three) times daily., Disp: 90 capsule, Rfl: 5 .  lisinopril (ZESTRIL) 30 MG tablet, Take 1 tablet (30 mg total) by mouth daily., Disp: 30 tablet, Rfl: 11 .  nystatin (MYCOSTATIN/NYSTOP) powder, Apply topically 4 (four) times daily., Disp: 60 g, Rfl: 5 .  potassium chloride (K-DUR) 10 MEQ tablet, Take 1  tablet (10 mEq total) by mouth daily., Disp: 30 tablet, Rfl: 11 .  traMADol (ULTRAM) 50 MG tablet, Take 2 tablets (100 mg total) by mouth every 6 (six) hours as needed., Disp: 120 tablet, Rfl: 5 .  triamcinolone cream (KENALOG) 0.1 %, Apply 1 application topically 2 (two) times daily., Disp: 30 g, Rfl: 0  Current Facility-Administered Medications:  .  cyanocobalamin ((VITAMIN B-12)) injection 1,000 mcg, 1,000 mcg, Intramuscular, Q30 days, Jones, Angel S, PA-Melissa, 1,000 mcg at 03/17/19 1435  Assessment/ Plan: 78 y.o. female   1. URI with cough and congestion We will quickly treat with Omnicef twice daily.  We discussed other etiologies of cough and wheezing including bronchospasm, heart failure with fluid overload.  We also discussed the possibility for COVID-19 and I recommended that she isolate at home.  Okay to continue salt water gargles.  Would consider adding Mucinex.  Discussed red flag signs and symptoms warranting further evaluation emergency department.  She was good understanding will follow-up as needed - cefdinir (OMNICEF) 300 MG capsule; Take 1 capsule (300 mg total) by mouth 2 (two) times daily. 1 po BID  Dispense: 20 capsule; Refill: 0  2. Advice given about COVID-19 virus by telephone  Start time: 4:31pm End time: 4:37pm  Total time spent on patient care (including telephone call/ virtual visit): 12 minutes  [place], [place] [phone]

## 2019-04-16 ENCOUNTER — Other Ambulatory Visit: Payer: Self-pay

## 2019-04-17 ENCOUNTER — Telehealth: Payer: Self-pay | Admitting: Physician Assistant

## 2019-04-17 ENCOUNTER — Ambulatory Visit (INDEPENDENT_AMBULATORY_CARE_PROVIDER_SITE_OTHER): Payer: Medicare HMO

## 2019-04-17 DIAGNOSIS — E538 Deficiency of other specified B group vitamins: Secondary | ICD-10-CM | POA: Diagnosis not present

## 2019-04-17 NOTE — Chronic Care Management (AMB) (Signed)
°  Care Management   Note  04/17/2019 Name: DONNALEE WURTS MRN: [id number] DOB: 26-Sep-1940  MILENE BAKKER is a 78 y.o. year old female who is a primary care patient of Theodoro Clock and is actively engaged with the care management team. I reached out to [name] by phone today to assist with scheduling a follow up appointment with the RN Case Manager  Follow up plan: Telephone appointment with CCM team member scheduled for: 04/29/2019  [place], [facility]  [place], [place] [address] Direct Dial: [email or web address]  Website: [email or web address]

## 2019-04-17 NOTE — Progress Notes (Signed)
Cyanocobalamin injection given to left deltoid.  Patient tolerated well. 

## 2019-04-28 DIAGNOSIS — M25551 Pain in right hip: Secondary | ICD-10-CM | POA: Diagnosis not present

## 2019-04-28 DIAGNOSIS — Z471 Aftercare following joint replacement surgery: Secondary | ICD-10-CM | POA: Diagnosis not present

## 2019-04-28 DIAGNOSIS — Z96641 Presence of right artificial hip joint: Secondary | ICD-10-CM | POA: Diagnosis not present

## 2019-04-29 ENCOUNTER — Telehealth: Payer: Medicare HMO

## 2019-04-30 ENCOUNTER — Ambulatory Visit (INDEPENDENT_AMBULATORY_CARE_PROVIDER_SITE_OTHER): Payer: Medicare HMO | Admitting: Physician Assistant

## 2019-04-30 ENCOUNTER — Ambulatory Visit: Payer: Medicare HMO | Admitting: *Deleted

## 2019-04-30 DIAGNOSIS — M25559 Pain in unspecified hip: Secondary | ICD-10-CM | POA: Diagnosis not present

## 2019-04-30 DIAGNOSIS — M159 Polyosteoarthritis, unspecified: Secondary | ICD-10-CM

## 2019-04-30 DIAGNOSIS — M810 Age-related osteoporosis without current pathological fracture: Secondary | ICD-10-CM

## 2019-04-30 MED ORDER — PREDNISONE 10 MG PO TABS: 20.0000 mg | ORAL_TABLET | Freq: Every day | ORAL | 0 refills | Status: DC

## 2019-04-30 NOTE — Patient Instructions (Addendum)
Visit Information     Visit Information  Goals Addressed            This Visit's Progress     Patient Stated   . Osteoarthritis Management (pt-stated)       Current Barriers:  . Chronic Disease Management support and education needs related to osteoarthritis  Nurse Case Manager Clinical Goal(s):  [name] Over the next 30 days, patient will work with Consulting civil engineer to address needs related to osteoarthritis  Interventions:  . Evaluation of current treatment plan related to OA and patient's adherence to plan as established by provider. . Advised patient to keep appointments with Dr Luana Shu . Reviewed medications with patient and discussed Tramadol and Celebrex . Discussed plans with patient for ongoing care management follow up and provided patient with direct contact information for care management team  Patient Self Care Activities:  . Performs ADL's independently . Performs IADL's independently  Initial goal documentation     . Resolve Calf Pain (pt-stated)       Current Barriers:  [name] Knowledge Deficits related to cause and treatment of right calf pain  Nurse Case Manager Clinical Goal(s):  [name] Over the next 2 days, patient will work with PCP and clinical staff to address needs related to right calf pain  Interventions:  . Chart Reviewed . Discussed current HPI o Right calf pain x 1 month. Mainly hurts at night. o No redness or tenderness o Bilateral ankle swelling  o History of right hip surgery. Has talked with ortho about pain. [name] Appointment scheduled with PCP for today to discuss right calf pain . Reviewed S/S of DVT . Patient provided with CCM contact information and asked to reach out as needed  Patient Self Care Activities:  . Performs ADL's independently . Performs IADL's independently  Initial goal documentation       Other   . Osteoporosis Management       Current Barriers:  . Chronic Disease Management support and education needs related to  osteoporosis  Nurse Case Manager Clinical Goal(s):  [name] Over the next 30 days, patient will work with Consulting civil engineer to discuss osteoporosis management  Interventions:  . Evaluation of current treatment plan related to osteoporosis and patient's adherence to plan as established by provider. . Reviewed medications with patient o No medication for osteoporosis . Discussed plans with patient for ongoing care management follow up and provided patient with direct contact information for care management team . Discussed that patient is scheduled for a bone density scan on 05/07/2019 at ortho, Dr Hervey Ard office.   Patient Self Care Activities:  . Performs ADL's independently . Performs IADL's independently   Initial goal documentation        The care management team will reach out to the patient again over the next 14 days.    [name], BSN, RN-BC Embedded Chronic Care Manager [facility] Direct Dial: [phone]   The patient verbalized understanding of instructions provided today and declined a print copy of patient instruction materials.

## 2019-04-30 NOTE — Progress Notes (Signed)
Hip pain when she gets up and down   [place]   26 min 307     Telephone visit  Subjective: CC:hip and leg pain PCP: Terald Sleeper, PA-Melissa UA:[id number] Melissa Zavala is a 78 y.o. female calls for telephone consult today. Patient provides verbal consent for consult held via phone.  Patient is identified with 2 separate identifiers.  At this time the entire area is on COVID-19 social distancing and stay home orders are in place.  Patient is of higher risk and therefore we are performing this by a virtual method.  Location of patient: home Location of provider: [facility] Others present for call: no  Over the past 2 days patient has had increasing pain and her lower left leg and calf.  Have asked her to squeeze it and does not have pain.  When she flexes her toes upward does not increase the pain.  She has measured the legs and there is no difference in the size.  I have encouraged her to watch this as these are signs of a blood clot and we would like to make sure she does not develop this.  But I do think her symptoms are more related to an orthopedic or a pinched nerve complaint.  So at this time we are going to try an anti-inflammatory and see if we can get this to calm down and if we will have referral to orthopedics.   ROS: Per HPI  Allergies  Allergen Reactions  . Iodinated Diagnostic Agents Anaphylaxis  . Codeine Other (See Comments)    Hallucinations  . Diphenhydramine Other (See Comments)    Patient states "feels like somebody is inside of me clawing me out"   Past Medical History:  Diagnosis Date  . Arthritis   . Hypertension     Current Outpatient Medications:  .  amLODipine (NORVASC) 10 MG tablet, Take 1 tablet (10 mg total) by mouth daily., Disp: 30 tablet, Rfl: 11 .  cefdinir (OMNICEF) 300 MG capsule, Take 1 capsule (300 mg total) by mouth 2 (two) times daily. 1 po BID, Disp: 20 capsule, Rfl: 0 .  celecoxib (CELEBREX) 200 MG capsule, TAKE 1 CAPSULE BY MOUTH EVERY DAY, Disp:  30 capsule, Rfl: 5 .  conjugated estrogens (PREMARIN) vaginal cream, Place 1 Applicatorful vaginally daily., Disp: 42.5 g, Rfl: 12 .  esomeprazole (NEXIUM) 40 MG capsule, TAKE (1) CAPSULE DAILY., Disp: 30 capsule, Rfl: 11 .  furosemide (LASIX) 20 MG tablet, TAKE 1 TABLET BY MOUTH EVERY DAY WHEN LOTS OF SWELLING, Disp: 30 tablet, Rfl: 5 .  gabapentin (NEURONTIN) 100 MG capsule, Take 1 capsule (100 mg total) by mouth 3 (three) times daily., Disp: 90 capsule, Rfl: 5 .  lisinopril (ZESTRIL) 30 MG tablet, Take 1 tablet (30 mg total) by mouth daily., Disp: 30 tablet, Rfl: 11 .  nystatin (MYCOSTATIN/NYSTOP) powder, Apply topically 4 (four) times daily., Disp: 60 g, Rfl: 5 .  potassium chloride (K-DUR) 10 MEQ tablet, Take 1 tablet (10 mEq total) by mouth daily., Disp: 30 tablet, Rfl: 11 .  predniSONE (DELTASONE) 10 MG tablet, Take 2 tablets (20 mg total) by mouth daily with breakfast. After 7 days take 10 mg daily, Disp: 30 tablet, Rfl: 0 .  traMADol (ULTRAM) 50 MG tablet, Take 2 tablets (100 mg total) by mouth every 6 (six) hours as needed., Disp: 120 tablet, Rfl: 5 .  triamcinolone cream (KENALOG) 0.1 %, Apply 1 application topically 2 (two) times daily., Disp: 30 g, Rfl: 0  Current Facility-Administered  Medications:  .  cyanocobalamin ((VITAMIN B-12)) injection 1,000 mcg, 1,000 mcg, Intramuscular, Q30 days, Particia Nearing S, PA-Melissa, 1,000 mcg at 04/17/19 1418  Assessment/ Plan: 78 y.o. female   1. Generalized OA - predniSONE (DELTASONE) 10 MG tablet; Take 2 tablets (20 mg total) by mouth daily with breakfast. After 7 days take 10 mg daily  Dispense: 30 tablet; Refill: 0  2. Hip pain See above Daily slow stretching   No follow-ups on file.  Continue all other maintenance medications as listed above.  Start time: 3:05 PM End time: 3:25 PM  Meds ordered this encounter  Medications  . predniSONE (DELTASONE) 10 MG tablet    Sig: Take 2 tablets (20 mg total) by mouth daily with breakfast. After  7 days take 10 mg daily    Dispense:  30 tablet    Refill:  0    Order Specific Question:   Supervising Provider    Answer:   Janora Norlander [id number]    Particia Nearing PA-Melissa [place] [phone]

## 2019-04-30 NOTE — Chronic Care Management (AMB) (Signed)
Chronic Care Management   Follow Up Note   04/30/2019 Name: Melissa Zavala MRN: [id number] DOB: Sep 17, 1940  Referred by: Terald Sleeper, PA-C Reason for referral : Chronic Care Management (RN Follow up)   Melissa Zavala is a 78 y.o. year old female who is a primary care patient of Terald Sleeper, PA-C. The CCM team was consulted for assistance with chronic disease management and care coordination needs.    Review of patient status, including review of consultants reports, relevant laboratory and other test results, and collaboration with appropriate care team members and the patient's provider was performed as part of comprehensive patient evaluation and provision of chronic care management services.    I spoke with [facility] by telephone today regarding her osteoporosis management, ongoing hip discomfort, and right calf pain.   Outpatient Encounter Medications as of 04/30/2019  Medication Sig  . amLODipine (NORVASC) 10 MG tablet Take 1 tablet (10 mg total) by mouth daily.  . cefdinir (OMNICEF) 300 MG capsule Take 1 capsule (300 mg total) by mouth 2 (two) times daily. 1 po BID  . celecoxib (CELEBREX) 200 MG capsule TAKE 1 CAPSULE BY MOUTH EVERY DAY  . conjugated estrogens (PREMARIN) vaginal cream Place 1 Applicatorful vaginally daily.  Melissa Zavala esomeprazole (NEXIUM) 40 MG capsule TAKE (1) CAPSULE DAILY.  . furosemide (LASIX) 20 MG tablet TAKE 1 TABLET BY MOUTH EVERY DAY WHEN LOTS OF SWELLING  . gabapentin (NEURONTIN) 100 MG capsule Take 1 capsule (100 mg total) by mouth 3 (three) times daily.  Melissa Zavala lisinopril (ZESTRIL) 30 MG tablet Take 1 tablet (30 mg total) by mouth daily.  Melissa Zavala nystatin (MYCOSTATIN/NYSTOP) powder Apply topically 4 (four) times daily.  . potassium chloride (K-DUR) 10 MEQ tablet Take 1 tablet (10 mEq total) by mouth daily.  . traMADol (ULTRAM) 50 MG tablet Take 2 tablets (100 mg total) by mouth every 6 (six) hours as needed.  . triamcinolone cream (KENALOG) 0.1 % Apply 1 application  topically 2 (two) times daily.   Facility-Administered Encounter Medications as of 04/30/2019  Medication  . cyanocobalamin ((VITAMIN B-12)) injection 1,000 mcg     RN Assessment & Care Plan     Patient Stated   . Resolve Calf Pain (pt-stated)       Current Barriers:  Melissa Zavala Knowledge Deficits related to cause and treatment of right calf pain  Nurse Case Manager Clinical Goal(s):  Melissa Zavala Over the next 2 days, patient will work with PCP and clinical staff to address needs related to right calf pain  Interventions:  . Chart Reviewed . Discussed current HPI o Right calf pain x 1 month. Mainly hurts at night. o No redness or tenderness o Bilateral ankle swelling  o History of right hip surgery. Has talked with ortho about pain. Melissa Zavala Appointment scheduled with PCP for today to discuss right calf pain . Reviewed S/S of DVT . Patient provided with CCM contact information and asked to reach out as needed  Patient Self Care Activities:  . Performs ADL's independently . Performs IADL's independently  Initial goal documentation       . Osteoporosis Management       Current Barriers:  . Chronic Disease Management support and education needs related to osteoporosis  Nurse Case Manager Clinical Goal(s):  Melissa Zavala Over the next 30 days, patient will work with Consulting civil engineer to discuss osteoporosis management  Interventions:  . Evaluation of current treatment plan related to osteoporosis and patient's adherence to plan as established by provider. Melissa Zavala  Reviewed medications with patient o No medication for osteoporosis . Discussed plans with patient for ongoing care management follow up and provided patient with direct contact information for care management team . Discussed that patient is scheduled for a bone density scan on 05/07/2019 at ortho, Dr Hervey Ard office.   Patient Self Care Activities:  . Performs ADL's independently . Performs IADL's independently   Initial goal documentation           . Osteoarthritis Management       Current Barriers:  . Chronic Disease Management support and education needs related to osteoarthritis  Nurse Case Manager Clinical Goal(s):  Melissa Zavala Over the next 30 days, patient will work with Consulting civil engineer to address needs related to osteoarthritis  Interventions:  . Evaluation of current treatment plan related to OA and patient's adherence to plan as established by provider. . Advised patient to keep appointments with Dr Luana Shu . Reviewed medications with patient and discussed Tramadol and Celebrex . Discussed plans with patient for ongoing care management follow up and provided patient with direct contact information for care management team  Patient Self Care Activities:  . Performs ADL's independently . Performs IADL's independently  Initial goal documentation       Follow-up The care management team will reach out to the patient again over the next 14 days.    [name], BSN, RN-BC Embedded Chronic Care Manager [facility] [place] [facility] Direct Dial: [phone]

## 2019-05-05 ENCOUNTER — Encounter: Payer: Self-pay | Admitting: Physician Assistant

## 2019-05-05 ENCOUNTER — Ambulatory Visit: Payer: Medicare HMO | Admitting: Physician Assistant

## 2019-05-05 DIAGNOSIS — M25559 Pain in unspecified hip: Secondary | ICD-10-CM | POA: Insufficient documentation

## 2019-05-06 ENCOUNTER — Encounter: Payer: Self-pay | Admitting: Physician Assistant

## 2019-05-06 ENCOUNTER — Ambulatory Visit (INDEPENDENT_AMBULATORY_CARE_PROVIDER_SITE_OTHER): Payer: Medicare HMO | Admitting: Physician Assistant

## 2019-05-06 ENCOUNTER — Other Ambulatory Visit: Payer: Self-pay

## 2019-05-06 VITALS — BP 155/66 | HR 84 | Temp 97.4°F | Ht 63.0 in | Wt 213.2 lb

## 2019-05-06 DIAGNOSIS — N898 Other specified noninflammatory disorders of vagina: Secondary | ICD-10-CM

## 2019-05-06 DIAGNOSIS — R6889 Other general symptoms and signs: Secondary | ICD-10-CM | POA: Diagnosis not present

## 2019-05-06 DIAGNOSIS — I10 Essential (primary) hypertension: Secondary | ICD-10-CM

## 2019-05-06 DIAGNOSIS — R609 Edema, unspecified: Secondary | ICD-10-CM

## 2019-05-06 LAB — URINALYSIS, COMPLETE
Bilirubin, UA: NEGATIVE
Glucose, UA: NEGATIVE
Ketones, UA: NEGATIVE
Leukocytes,UA: NEGATIVE
Nitrite, UA: NEGATIVE
Protein,UA: NEGATIVE
RBC, UA: NEGATIVE
Specific Gravity, UA: 1.015 (ref 1.005–1.030)
Urobilinogen, Ur: 0.2 mg/dL (ref 0.2–1.0)
pH, UA: 7 (ref 5.0–7.5)

## 2019-05-06 LAB — MICROSCOPIC EXAMINATION
RBC, Urine: NONE SEEN /hpf (ref 0–2)
Renal Epithel, UA: NONE SEEN /hpf

## 2019-05-06 MED ORDER — POTASSIUM CHLORIDE ER 10 MEQ PO TBCR: 10.0000 meq | EXTENDED_RELEASE_TABLET | Freq: Two times a day (BID) | ORAL | 11 refills | Status: DC

## 2019-05-06 MED ORDER — FLUCONAZOLE 150 MG PO TABS: ORAL_TABLET | ORAL | 0 refills | Status: DC

## 2019-05-06 MED ORDER — FUROSEMIDE 20 MG PO TABS: 40.0000 mg | ORAL_TABLET | Freq: Every day | ORAL | 5 refills | Status: DC

## 2019-05-07 DIAGNOSIS — R936 Abnormal findings on diagnostic imaging of limbs: Secondary | ICD-10-CM | POA: Diagnosis not present

## 2019-05-07 DIAGNOSIS — M25551 Pain in right hip: Secondary | ICD-10-CM | POA: Diagnosis not present

## 2019-05-07 DIAGNOSIS — Z96641 Presence of right artificial hip joint: Secondary | ICD-10-CM | POA: Diagnosis not present

## 2019-05-07 DIAGNOSIS — R1909 Other intra-abdominal and pelvic swelling, mass and lump: Secondary | ICD-10-CM | POA: Diagnosis not present

## 2019-05-07 DIAGNOSIS — Z471 Aftercare following joint replacement surgery: Secondary | ICD-10-CM | POA: Diagnosis not present

## 2019-05-07 LAB — URINE CULTURE

## 2019-05-12 ENCOUNTER — Encounter: Payer: Self-pay | Admitting: Physician Assistant

## 2019-05-12 NOTE — Progress Notes (Signed)
.   Acute Office Visit  Subjective:    Patient ID: Melissa Zavala, female    DOB: [birth year], 78 y.o.   MRN: [id number]  Chief Complaint  Patient presents with  . Leg Pain    Leg Pain  The incident occurred more than 1 week ago. There was no injury mechanism. The pain is present in the right knee and right leg. The quality of the pain is described as cramping, aching and stabbing. The pain is at a severity of 4/10. The pain has been constant since onset. Pertinent negatives include no muscle weakness, numbness or tingling. She has tried ice, elevation, acetaminophen, heat and rest for the symptoms.      Past Medical History:  Diagnosis Date  . Arthritis   . Hypertension     Past Surgical History:  Procedure Laterality Date  . TOTAL KNEE ARTHROPLASTY    . TOTAL KNEE ARTHROPLASTY Bilateral   . TUBAL LIGATION      Family History  Problem Relation Age of Onset  . Cancer Mother        Breast    Social History   Socioeconomic History  . Marital status: Legally Separated    Spouse name: Not on file  . Number of children: 7  . Years of education: Not on file  . Highest education level: 8th grade  Occupational History  . Not on file  Tobacco Use  . Smoking status: Never Smoker  . Smokeless tobacco: Never Used  Substance and Sexual Activity  . Alcohol use: No  . Drug use: No  . Sexual activity: Not Currently  Other Topics Concern  . Not on file  Social History Narrative  . Not on file   Social Determinants of Health   Financial Resource Strain: Low Risk   . Difficulty of Paying Living Expenses: Not hard at all  Food Insecurity: No Food Insecurity  . Worried About Charity fundraiser in the Last Year: Never true  . Ran Out of Food in the Last Year: Never true  Transportation Needs: No Transportation Needs  . Lack of Transportation (Medical): No  . Lack of Transportation (Non-Medical): No  Physical Activity: Inactive  . Days of Exercise per Week: 0 days  .  Minutes of Exercise per Session: 0 min  Stress: No Stress Concern Present  . Feeling of Stress : Not at all  Social Connections: Slightly Isolated  . Frequency of Communication with Friends and Family: More than three times a week  . Frequency of Social Gatherings with Friends and Family: Once a week  . Attends Religious Services: More than 4 times per year  . Active Member of Clubs or Organizations: Yes  . Attends Archivist Meetings: More than 4 times per year  . Marital Status: Separated  Intimate Partner Violence: Not At Risk  . Fear of Current or Ex-Partner: No  . Emotionally Abused: No  . Physically Abused: No  . Sexually Abused: No    Outpatient Medications Prior to Visit  Medication Sig Dispense Refill  . amLODipine (NORVASC) 10 MG tablet Take 1 tablet (10 mg total) by mouth daily. 30 tablet 11  . celecoxib (CELEBREX) 200 MG capsule TAKE 1 CAPSULE BY MOUTH EVERY DAY 30 capsule 5  . conjugated estrogens (PREMARIN) vaginal cream Place 1 Applicatorful vaginally daily. 42.5 g 12  . esomeprazole (NEXIUM) 40 MG capsule TAKE (1) CAPSULE DAILY. 30 capsule 11  . gabapentin (NEURONTIN) 100 MG capsule Take 1 capsule (100  mg total) by mouth 3 (three) times daily. 90 capsule 5  . lisinopril (ZESTRIL) 30 MG tablet Take 1 tablet (30 mg total) by mouth daily. 30 tablet 11  . nystatin (MYCOSTATIN/NYSTOP) powder Apply topically 4 (four) times daily. 60 g 5  . predniSONE (DELTASONE) 10 MG tablet Take 2 tablets (20 mg total) by mouth daily with breakfast. After 7 days take 10 mg daily 30 tablet 0  . traMADol (ULTRAM) 50 MG tablet Take 2 tablets (100 mg total) by mouth every 6 (six) hours as needed. 120 tablet 5  . triamcinolone cream (KENALOG) 0.1 % Apply 1 application topically 2 (two) times daily. 30 g 0  . cefdinir (OMNICEF) 300 MG capsule Take 1 capsule (300 mg total) by mouth 2 (two) times daily. 1 po BID 20 capsule 0  . furosemide (LASIX) 20 MG tablet TAKE 1 TABLET BY MOUTH EVERY  DAY WHEN LOTS OF SWELLING 30 tablet 5  . potassium chloride (K-DUR) 10 MEQ tablet Take 1 tablet (10 mEq total) by mouth daily. 30 tablet 11   Facility-Administered Medications Prior to Visit  Medication Dose Route Frequency Provider Last Rate Last Admin  . cyanocobalamin ((VITAMIN B-12)) injection 1,000 mcg  1,000 mcg Intramuscular Q30 days Terald Sleeper, PA-C   1,000 mcg at 04/17/19 1418    Allergies  Allergen Reactions  . Iodinated Diagnostic Agents Anaphylaxis  . Codeine Other (See Comments)    Hallucinations  . Diphenhydramine Other (See Comments)    Patient states "feels like somebody is inside of me clawing me out"    Review of Systems  Constitutional: Negative.   HENT: Negative.   Eyes: Negative.   Respiratory: Negative.   Gastrointestinal: Negative.   Genitourinary: Negative.   Musculoskeletal: Positive for arthralgias, joint swelling and myalgias.  Skin: Negative for color change.  Neurological: Negative for tingling and numbness.       Objective:    Physical Exam Constitutional:      General: She is not in acute distress.    Appearance: Normal appearance. She is well-developed.  HENT:     Head: Normocephalic and atraumatic.  Cardiovascular:     Rate and Rhythm: Normal rate.  Pulmonary:     Effort: Pulmonary effort is normal.  Musculoskeletal:     Right lower leg: Swelling and tenderness present. No deformity.       Legs:  Skin:    General: Skin is warm and dry.     Findings: No rash.  Neurological:     Mental Status: She is alert and oriented to person, place, and time.     Deep Tendon Reflexes: Reflexes are normal and symmetric.     BP (!) 155/66   Pulse 84   Temp (!) 97.4 F (36.3 C) (Temporal)   Ht 5\' 3"  (1.6 m)   Wt 213 lb 3.2 oz (96.7 kg)   SpO2 95%   BMI 37.77 kg/m  Wt Readings from Last 3 Encounters:  05/06/19 213 lb 3.2 oz (96.7 kg)  02/03/19 209 lb (94.8 kg)  12/13/18 199 lb (90.3 kg)    Health Maintenance Due  Topic Date Due    . DEXA SCAN  11/07/2005    There are no preventive care reminders to display for this patient.   Lab Results  Component Value Date   TSH 0.824 12/13/2018   Lab Results  Component Value Date   WBC 6.0 12/13/2018   HGB 11.4 12/13/2018   HCT 35.4 12/13/2018   MCV 83 12/13/2018  PLT 267 12/13/2018   Lab Results  Component Value Date   NA 142 12/13/2018   K 4.4 12/13/2018   CO2 22 12/13/2018   GLUCOSE 101 (H) 12/13/2018   BUN 11 12/13/2018   CREATININE 0.60 12/13/2018   BILITOT <0.2 12/13/2018   ALKPHOS 64 12/13/2018   AST 15 12/13/2018   ALT 11 12/13/2018   PROT 6.6 12/13/2018   ALBUMIN 4.1 12/13/2018   CALCIUM 9.8 12/13/2018   ANIONGAP 8 07/18/2016   Lab Results  Component Value Date   CHOL 204 (H) 12/13/2018   Lab Results  Component Value Date   HDL 93 12/13/2018   Lab Results  Component Value Date   LDLCALC 99 12/13/2018   Lab Results  Component Value Date   TRIG 59 12/13/2018   Lab Results  Component Value Date   CHOLHDL 2.2 12/13/2018   No results found for: HGBA1C     Assessment & Plan:   Problem List Items Addressed This Visit      Cardiovascular and Mediastinum   Essential hypertension   Relevant Medications   furosemide (LASIX) 20 MG tablet   potassium chloride (KLOR-CON) 10 MEQ tablet     Other   Edema   Relevant Medications   furosemide (LASIX) 20 MG tablet    Other Visit Diagnoses    Vaginal odor    -  Primary   Relevant Medications   fluconazole (DIFLUCAN) 150 MG tablet   Other Relevant Orders   Urine culture (Completed)   urinalysis- dip and micro (Completed)   Microscopic Examination (Completed)       Meds ordered this encounter  Medications  . furosemide (LASIX) 20 MG tablet    Sig: Take 2 tablets (40 mg total) by mouth daily. TAKE 1 TABLET BY MOUTH EVERY DAY WHEN LOTS OF SWELLING    Dispense:  60 tablet    Refill:  5    Order Specific Question:   Supervising Provider    Answer:   Janora Norlander [id number]   . potassium chloride (KLOR-CON) 10 MEQ tablet    Sig: Take 1 tablet (10 mEq total) by mouth 2 (two) times daily.    Dispense:  60 tablet    Refill:  11    Order Specific Question:   Supervising Provider    Answer:   Janora Norlander [id number]  . fluconazole (DIFLUCAN) 150 MG tablet    Sig: 1 po q week x 4 weeks    Dispense:  4 tablet    Refill:  0    Order Specific Question:   Supervising Provider    Answer:   Janora Norlander [id number]     Terald Sleeper, PA-C

## 2019-05-15 ENCOUNTER — Ambulatory Visit (INDEPENDENT_AMBULATORY_CARE_PROVIDER_SITE_OTHER): Payer: Medicare HMO | Admitting: *Deleted

## 2019-05-15 ENCOUNTER — Telehealth: Payer: Self-pay | Admitting: *Deleted

## 2019-05-15 DIAGNOSIS — M159 Polyosteoarthritis, unspecified: Secondary | ICD-10-CM

## 2019-05-15 DIAGNOSIS — M8949 Other hypertrophic osteoarthropathy, multiple sites: Secondary | ICD-10-CM | POA: Diagnosis not present

## 2019-05-15 DIAGNOSIS — M25552 Pain in left hip: Secondary | ICD-10-CM

## 2019-05-15 NOTE — Patient Instructions (Signed)
Visit Information  Goals Addressed            This Visit's Progress     Patient Stated   . Follow Up On Adnexal Mass (pt-stated)       Current Barriers:  [name] Knowledge Deficits related to adnexal lesion  found on bone scan in a patient with osteoarthritis   Nurse Case Manager Clinical Goal(s):  [name] Over the next 30 days, patient will work with Consulting civil engineer to address needs related to care coordination related to adnexal mass . Over the next 30 days, patient will attend all scheduled medical appointments: Transvaginal Ultrasound on 05/21/2018 at [facility]  Interventions:  . Evaluation of current treatment plan related to adnexal mass and patient's adherence to plan as established by provider. . Discussed plans with patient for ongoing care management follow up and provided patient with direct contact information for care management team . Reviewed scheduled/upcoming provider appointments including: Transvaginal US on 05/21/2018  Patient Self Care Activities:  . Performs ADL's independently . Performs IADL's independently  Initial goal documentation     . Osteoarthritis Management (pt-stated)       Current Barriers:  . Chronic Disease Management support and education needs related to osteoarthritis  Nurse Case Manager Clinical Goal(s):  [name] Over the next 30 days, patient will attend all scheduled medical appointments: Georjean Mode, PA-C with [facility]  Interventions:  . Evaluation of current treatment plan related to OA and patient's adherence to plan as established by provider. . Advised patient to keep appointments with Mr Luana Shu . Reviewed medications with patient and discussed Tramadol, Celebrex, and short course of prednisone . Patient requesting refill on prednisone. Will send note to PCP . Discussed plans with patient for ongoing care management follow up and provided patient with direct contact information for care management team  Patient Self Care  Activities:  . Performs ADL's independently . Performs IADL's independently  Please see past updates related to this goal by clicking on the "Past Updates" button in the selected goal      . COMPLETED: Resolve Calf Pain (pt-stated)       Current Barriers:  [name] Knowledge Deficits related to cause and treatment of right calf pain  Nurse Case Manager Clinical Goal(s):  [name] Over the next 30 days, patient will follow-up with orthopedic surgeon  Interventions:  . Chart Reviewed including notes from [facility] o Bone scan of right hip shows loosening of hardware . Discussed current treatment o Still taking prednisone but will finish today . Discussed current pain level which is 0 out of 10.  . Encouraged patient to continue to follow-up with orthopedic surgeon's office  Patient Self Care Activities:  . Performs ADL's independently . Performs IADL's independently  Please see past updates related to this goal by clicking on the "Past Updates" button in the selected goal   Goal Completed       The care management team will reach out to the patient again over the next 15 days.    [name], BSN, RN-BC Embedded Chronic Care Manager [facility] [place] [facility] Direct Dial: [phone]   The patient verbalized understanding of instructions provided today and declined a print copy of patient instruction materials.

## 2019-05-15 NOTE — Chronic Care Management (AMB) (Signed)
Chronic Care Management   Follow Up Note   05/15/2019 Name: Melissa Zavala MRN: [id number] DOB: [birth year]  Referred by: Terald Sleeper, PA-C Reason for referral : Chronic Care Management (RN follow up)   [name] is a 78 y.o. year old female who is a primary care patient of Terald Sleeper, PA-C. The CCM team was consulted for assistance with chronic disease management and care coordination needs.    Review of patient status, including review of consultants reports, relevant laboratory and other test results, and collaboration with appropriate care team members and the patient's provider was performed as part of comprehensive patient evaluation and provision of chronic care management services.    Subjective: I spoke with Melissa Zavala by telephone today regarding her hip and calf pain. She reports that the calf pain has resolved since taking prednisone and that she had a follow-up appointment with her orthopedic surgeon's office at [facility]. They found an adnexal mass on her bone scan and she is scheduled for an ultrasound next week to follow-up on that. She expressed appreciation for my help in facilitating her appointment with her PCP.   Objective: THREE-PHASE BONE SCAN, 05/07/2019 3:23 PM CONCLUSION: 1. Increased tracer uptake in right greater and lesser trochanters and at the tip of right hip hardware stem which can be seen with hardware loosening. 2. Incidentally noted 3.5 cm left adnexal hypodense lesion with possible small focal calcifications within. Recommend further evaluation with ultrasound given postmenopausal status.   Outpatient Encounter Medications as of 05/15/2019  Medication Sig  . amLODipine (NORVASC) 10 MG tablet Take 1 tablet (10 mg total) by mouth daily.  . celecoxib (CELEBREX) 200 MG capsule TAKE 1 CAPSULE BY MOUTH EVERY DAY  . conjugated estrogens (PREMARIN) vaginal cream Place 1 Applicatorful vaginally daily.  [name] esomeprazole (NEXIUM) 40 MG  capsule TAKE (1) CAPSULE DAILY.  . fluconazole (DIFLUCAN) 150 MG tablet 1 po q week x 4 weeks  . furosemide (LASIX) 20 MG tablet Take 2 tablets (40 mg total) by mouth daily. TAKE 1 TABLET BY MOUTH EVERY DAY WHEN LOTS OF SWELLING  . gabapentin (NEURONTIN) 100 MG capsule Take 1 capsule (100 mg total) by mouth 3 (three) times daily.  [name] lisinopril (ZESTRIL) 30 MG tablet Take 1 tablet (30 mg total) by mouth daily.  [name] nystatin (MYCOSTATIN/NYSTOP) powder Apply topically 4 (four) times daily.  . potassium chloride (KLOR-CON) 10 MEQ tablet Take 1 tablet (10 mEq total) by mouth 2 (two) times daily.  . predniSONE (DELTASONE) 10 MG tablet Take 2 tablets (20 mg total) by mouth daily with breakfast. After 7 days take 10 mg daily  . traMADol (ULTRAM) 50 MG tablet Take 2 tablets (100 mg total) by mouth every 6 (six) hours as needed.  . triamcinolone cream (KENALOG) 0.1 % Apply 1 application topically 2 (two) times daily.   Facility-Administered Encounter Medications as of 05/15/2019  Medication  . cyanocobalamin ((VITAMIN B-12)) injection 1,000 mcg     RN Assessment & Care Plan   . Follow Up On Adnexal Mass (pt-stated)       Current Barriers:  [name] Knowledge Deficits related to adnexal lesion  found on bone scan in a patient with osteoarthritis   Nurse Case Manager Clinical Goal(s):  [name] Over the next 30 days, patient will work with Consulting civil engineer to address needs related to care coordination related to adnexal mass . Over the next 30 days, patient will attend all scheduled medical appointments: Transvaginal Ultrasound on  05/21/2018 at [facility]  Interventions:  . Evaluation of current treatment plan related to adnexal mass and patient's adherence to plan as established by provider. . Discussed plans with patient for ongoing care management follow up and provided patient with direct contact information for care management team . Reviewed scheduled/upcoming provider appointments including:  Transvaginal US on 05/21/2018  Patient Self Care Activities:  . Performs ADL's independently . Performs IADL's independently  Initial goal documentation     . Osteoarthritis Management (pt-stated)       Current Barriers:  . Chronic Disease Management support and education needs related to osteoarthritis  Nurse Case Manager Clinical Goal(s):  [name] Over the next 30 days, patient will attend all scheduled medical appointments: Georjean Mode, PA-C with [facility]  Interventions:  . Evaluation of current treatment plan related to OA and patient's adherence to plan as established by provider. . Advised patient to keep appointments with Mr Luana Shu . Reviewed medications with patient and discussed Tramadol, Celebrex, and short course of prednisone . Patient requesting refill on prednisone. Will send note to PCP . Discussed plans with patient for ongoing care management follow up and provided patient with direct contact information for care management team  Patient Self Care Activities:  . Performs ADL's independently . Performs IADL's independently  Please see past updates related to this goal by clicking on the "Past Updates" button in the selected goal      . COMPLETED: Resolve Calf Pain (pt-stated)       Current Barriers:  [name] Knowledge Deficits related to cause and treatment of right calf pain  Nurse Case Manager Clinical Goal(s):  [name] Over the next 30 days, patient will follow-up with orthopedic surgeon  Interventions:  . Chart Reviewed including notes from [facility] o Bone scan of right hip shows loosening of hardware . Discussed current treatment o Still taking prednisone but will finish today . Discussed current pain level which is 0 out of 10.  . Encouraged patient to continue to follow-up with orthopedic surgeon's office  Patient Self Care Activities:  . Performs ADL's independently . Performs IADL's independently  Please see past updates related to this  goal by clicking on the "Past Updates" button in the selected goal   Goal Completed       Follow-up Plan The care management team will reach out to the patient again over the next 15 days.    [name], BSN, RN-BC Embedded Chronic Care Manager [facility] [place] [facility] Direct Dial: [phone]

## 2019-05-15 NOTE — Telephone Encounter (Signed)
05/15/2019  I spoke with [name] by telephone today as a CCM follow-up and she is requesting a refill on prednisone. She will complete the course today. States that her pain has resolved but would like another prescription. Advised that the office is closed today and tomorrow but that I will forward her message to her PCP to address on Monday. She was agreeable to that plan.   [name], BSN, RN-BC Embedded Chronic Care Manager [facility] [place] [facility] Direct Dial: [phone]

## 2019-05-17 ENCOUNTER — Other Ambulatory Visit: Payer: Self-pay | Admitting: Physician Assistant

## 2019-05-17 DIAGNOSIS — M159 Polyosteoarthritis, unspecified: Secondary | ICD-10-CM

## 2019-05-17 MED ORDER — PREDNISONE 10 MG PO TABS: 20.0000 mg | ORAL_TABLET | Freq: Every day | ORAL | 0 refills | Status: DC

## 2019-05-17 NOTE — Telephone Encounter (Signed)
The refill has been sent

## 2019-05-19 ENCOUNTER — Other Ambulatory Visit: Payer: Self-pay | Admitting: Physician Assistant

## 2019-05-19 DIAGNOSIS — M159 Polyosteoarthritis, unspecified: Secondary | ICD-10-CM

## 2019-05-21 ENCOUNTER — Other Ambulatory Visit: Payer: Self-pay

## 2019-05-21 ENCOUNTER — Ambulatory Visit (INDEPENDENT_AMBULATORY_CARE_PROVIDER_SITE_OTHER): Payer: Medicare HMO

## 2019-05-21 DIAGNOSIS — E538 Deficiency of other specified B group vitamins: Secondary | ICD-10-CM | POA: Diagnosis not present

## 2019-05-21 DIAGNOSIS — Z1231 Encounter for screening mammogram for malignant neoplasm of breast: Secondary | ICD-10-CM | POA: Diagnosis not present

## 2019-05-22 DIAGNOSIS — N9489 Other specified conditions associated with female genital organs and menstrual cycle: Secondary | ICD-10-CM | POA: Diagnosis not present

## 2019-05-26 ENCOUNTER — Telehealth: Payer: Self-pay | Admitting: Physician Assistant

## 2019-05-26 ENCOUNTER — Other Ambulatory Visit: Payer: Self-pay

## 2019-05-26 ENCOUNTER — Ambulatory Visit (INDEPENDENT_AMBULATORY_CARE_PROVIDER_SITE_OTHER): Payer: Medicare HMO | Admitting: Family

## 2019-05-26 ENCOUNTER — Encounter: Payer: Self-pay | Admitting: Family

## 2019-05-26 DIAGNOSIS — B9689 Other specified bacterial agents as the cause of diseases classified elsewhere: Secondary | ICD-10-CM

## 2019-05-26 DIAGNOSIS — J208 Acute bronchitis due to other specified organisms: Secondary | ICD-10-CM

## 2019-05-26 MED ORDER — BENZONATATE 200 MG PO CAPS: 200.0000 mg | ORAL_CAPSULE | Freq: Three times a day (TID) | ORAL | 1 refills | Status: DC | PRN

## 2019-05-26 MED ORDER — ALBUTEROL SULFATE HFA 108 (90 BASE) MCG/ACT IN AERS: 2.0000 | INHALATION_SPRAY | Freq: Four times a day (QID) | RESPIRATORY_TRACT | 0 refills | Status: DC | PRN

## 2019-05-26 MED ORDER — DOXYCYCLINE HYCLATE 100 MG PO TABS: 100.0000 mg | ORAL_TABLET | Freq: Two times a day (BID) | ORAL | 0 refills | Status: DC

## 2019-05-26 NOTE — Progress Notes (Signed)
Virtual Visit via telephone Note Due to COVID-19 pandemic this visit was conducted virtually. This visit type was conducted due to national recommendations for restrictions regarding the COVID-19 Pandemic (e.g. social distancing, sheltering in place) in an effort to limit this patient's exposure and mitigate transmission in our community. All issues noted in this document were discussed and addressed.  A physical exam was not performed with this format.  I connected with Melissa Zavala on 05/26/19 at 2:40 pm  by telephone and verified that I am speaking with the correct person using two identifiers. Melissa Zavala is currently located in the store  and no one is currently with her during visit. The provider, Evelina Dun, FNP is located in their office at time of visit.  I discussed the limitations, risks, security and privacy concerns of performing an evaluation and management service by telephone and the availability of in person appointments. I also discussed with the patient that there may be a patient responsible charge related to this service. The patient expressed understanding and agreed to proceed.   History and Present Illness:  Cough This is a new problem. The current episode started in the past 7 days. The problem has been waxing and waning. The problem occurs every few minutes. The cough is productive of purulent sputum. Associated symptoms include headaches, nasal congestion, shortness of breath ("if I walk any distance") and wheezing. Pertinent negatives include no chills, ear congestion, ear pain, fever, myalgias, postnasal drip or sore throat. She has tried OTC cough suppressant and rest for the symptoms. The treatment provided mild relief.      Review of Systems  Constitutional: Negative for chills and fever.  HENT: Negative for ear pain, postnasal drip and sore throat.   Respiratory: Positive for cough, shortness of breath ("if I walk any distance") and wheezing.     Musculoskeletal: Negative for myalgias.  Neurological: Positive for headaches.     Observations/Objective: No SOB or distress   Assessment and Plan: 1. Acute bacterial bronchitis Pt will go get COVID tested to rule out.  - Take meds as prescribed - Use a cool mist humidifier  -Use saline nose sprays frequently -Force fluids -For any cough or congestion  Use plain Mucinex- regular strength or max strength is fine -For fever or aces or pains- take tylenol or ibuprofen. -Throat lozenges if help -Call if symptoms worsen or do not improve  - benzonatate (TESSALON) 200 MG capsule; Take 1 capsule (200 mg total) by mouth 3 (three) times daily as needed.  Dispense: 30 capsule; Refill: 1 - doxycycline (VIBRA-TABS) 100 MG tablet; Take 1 tablet (100 mg total) by mouth 2 (two) times daily.  Dispense: 20 tablet; Refill: 0 - albuterol (VENTOLIN HFA) 108 (90 Base) MCG/ACT inhaler; Inhale 2 puffs into the lungs every 6 (six) hours as needed for wheezing or shortness of breath.  Dispense: 8 g; Refill: 0     I discussed the assessment and treatment plan with the patient. The patient was provided an opportunity to ask questions and all were answered. The patient agreed with the plan and demonstrated an understanding of the instructions.   The patient was advised to call back or seek an in-person evaluation if the symptoms worsen or if the condition fails to improve as anticipated.  The above assessment and management plan was discussed with the patient. The patient verbalized understanding of and has agreed to the management plan. Patient is aware to call the clinic if symptoms persist or worsen.  Patient is aware when to return to the clinic for a follow-up visit. Patient educated on when it is appropriate to go to the emergency department.   Time call ended: 2:54 pm    I provided 14 minutes of non-face-to-face time during this encounter.    Evelina Dun, FNP

## 2019-05-26 NOTE — Telephone Encounter (Signed)
Making appt with provider

## 2019-05-28 ENCOUNTER — Ambulatory Visit (INDEPENDENT_AMBULATORY_CARE_PROVIDER_SITE_OTHER): Payer: Medicare HMO | Admitting: *Deleted

## 2019-05-28 DIAGNOSIS — R1909 Other intra-abdominal and pelvic swelling, mass and lump: Secondary | ICD-10-CM | POA: Diagnosis not present

## 2019-05-28 DIAGNOSIS — M159 Polyosteoarthritis, unspecified: Secondary | ICD-10-CM

## 2019-05-28 DIAGNOSIS — M8949 Other hypertrophic osteoarthropathy, multiple sites: Secondary | ICD-10-CM | POA: Diagnosis not present

## 2019-05-28 DIAGNOSIS — J069 Acute upper respiratory infection, unspecified: Secondary | ICD-10-CM

## 2019-05-28 NOTE — Patient Instructions (Signed)
Visit Information  Goals Addressed            This Visit's Progress     Patient Stated   . Follow Up On Adnexal Mass (pt-stated)       Current Barriers:  [name] Knowledge Deficits related to adnexal lesion  found on bone scan in a patient with osteoarthritis   Nurse Case Manager Clinical Goal(s):  [name] Over the next 30 days, patient will attend all scheduled medical appointments: Gynecology/oncology appointment at [facility]  Interventions:  . Chart reviewed including notes from [facility] . Talked with patient by telephone . Talked with Orthopedic department at [facility] [phone]) and confirmed that they have placed a gynecology/oncology referral for [name] and that she should be receiving a call from their office [phone]) . Encouraged patient to keep all scheduled medical appts  Patient Self Care Activities:  . Performs ADL's independently . Performs IADL's independently  Please see past updates related to this goal by clicking on the "Past Updates" button in the selected goal      . Osteoarthritis Management (pt-stated)       Current Barriers:  . Chronic Disease Management support and education needs related to osteoarthritis  Nurse Case Manager Clinical Goal(s):  [name] Over the next 30 days, patient will attend all scheduled medical appointments: Georjean Mode, PA-C with [facility]  Interventions:  . Evaluation of current treatment plan related to OA and patient's adherence to plan as established by provider. . Advised patient to keep appointments with Mr Luana Shu . Reviewed medications with patient and discussed Tramadol, Celebrex, and short course of prednisone . Discussed limitations on using prednisone . Discussed plans with patient for ongoing care management follow up and provided patient with direct contact information for care management team  Patient Self Care Activities:  . Performs ADL's independently . Performs IADL's  independently  Please see past updates related to this goal by clicking on the "Past Updates" button in the selected goal        Other   . URI       Current Barriers:  [name] Knowledge Deficits related to URI management  Nurse Case Manager Clinical Goal(s):  [name] Over the next 10 days, patient will continue to see improvement in URI  Interventions:  . Evaluation of current treatment plan related to URI and patient's adherence to plan as established by provider. . Reviewed medications with patient and discussed albuterol, doxycycline, and prednisone . Discussed plans with patient for ongoing care management follow up and provided patient with direct contact information for care management team . Discussed Covid testing. She has not had this done. Advised that she can have it done at the [facility] at [facility] in [place] . Encouraged her to reach out to PCP with any new or worsening symptoms or if symptoms fail to improve. Seek emergency medical care if needed.   Patient Self Care Activities:  . Performs ADL's independently . Performs IADL's independently  Initial goal documentation        The care management team will reach out to the patient again over the next 30 days.   [name], BSN, RN-BC Embedded Chronic Care Manager [facility] [place] [facility] Direct Dial: [phone]   The patient verbalized understanding of instructions provided today and declined a print copy of patient instruction materials.

## 2019-05-28 NOTE — Chronic Care Management (AMB) (Signed)
Chronic Care Management   Follow Up Note   05/28/2019 Name: Melissa Zavala MRN: [id number] DOB: 1941/04/28  Referred by: Terald Sleeper, PA-C Reason for referral : Chronic Care Management (RN Follow up)   Melissa Zavala is a 79 y.o. year old female who is a primary care patient of Terald Sleeper, PA-C. The CCM team was consulted for assistance with chronic disease management and care coordination needs.    Review of patient status, including review of consultants reports, relevant laboratory and other test results, and collaboration with appropriate care team members and the patient's provider was performed as part of comprehensive patient evaluation and provision of chronic care management services.    I spoke with Melissa Zavala by telephone today regarding her URI, adnexal mass, and osteoarthritis.   Outpatient Encounter Medications as of 05/28/2019  Medication Sig  . albuterol (VENTOLIN HFA) 108 (90 Base) MCG/ACT inhaler Inhale 2 puffs into the lungs every 6 (six) hours as needed for wheezing or shortness of breath.  [name] amLODipine (NORVASC) 10 MG tablet Take 1 tablet (10 mg total) by mouth daily.  . benzonatate (TESSALON) 200 MG capsule Take 1 capsule (200 mg total) by mouth 3 (three) times daily as needed.  . celecoxib (CELEBREX) 200 MG capsule TAKE 1 CAPSULE BY MOUTH EVERY DAY  . conjugated estrogens (PREMARIN) vaginal cream Place 1 Applicatorful vaginally daily.  [name] doxycycline (VIBRA-TABS) 100 MG tablet Take 1 tablet (100 mg total) by mouth 2 (two) times daily.  [name] esomeprazole (NEXIUM) 40 MG capsule TAKE (1) CAPSULE DAILY.  . fluconazole (DIFLUCAN) 150 MG tablet 1 po q week x 4 weeks  . furosemide (LASIX) 20 MG tablet Take 2 tablets (40 mg total) by mouth daily. TAKE 1 TABLET BY MOUTH EVERY DAY WHEN LOTS OF SWELLING  . gabapentin (NEURONTIN) 100 MG capsule Take 1 capsule (100 mg total) by mouth 3 (three) times daily.  [name] lisinopril (ZESTRIL) 30 MG tablet Take 1 tablet (30 mg total) by  mouth daily.  [name] nystatin (MYCOSTATIN/NYSTOP) powder Apply topically 4 (four) times daily.  . potassium chloride (KLOR-CON) 10 MEQ tablet Take 1 tablet (10 mEq total) by mouth 2 (two) times daily.  . traMADol (ULTRAM) 50 MG tablet Take 2 tablets (100 mg total) by mouth every 6 (six) hours as needed.  . triamcinolone cream (KENALOG) 0.1 % Apply 1 application topically 2 (two) times daily.   Facility-Administered Encounter Medications as of 05/28/2019  Medication  . cyanocobalamin ((VITAMIN B-12)) injection 1,000 mcg     Goals Addressed    . Follow Up On Adnexal Mass       Current Barriers:  [name] Knowledge Deficits related to adnexal lesion  found on bone scan in a patient with osteoarthritis   Nurse Case Manager Clinical Goal(s):  [name] Over the next 30 days, patient will attend all scheduled medical appointments: Gynecology/oncology appointment at [facility]  Interventions:  . Chart reviewed including notes from [facility] . Talked with patient by telephone . Talked with Orthopedic department at [facility] [phone]) and confirmed that they have placed a gynecology/oncology referral for Melissa Zavala and that she should be receiving a call from their office [phone]) . Encouraged patient to keep all scheduled medical appts  Patient Self Care Activities:  . Performs ADL's independently . Performs IADL's independently  Please see past updates related to this goal by clicking on the "Past Updates" button in the selected goal      . Osteoarthritis Management  Current Barriers:  . Chronic Disease Management support and education needs related to osteoarthritis  Nurse Case Manager Clinical Goal(s):  [name] Over the next 30 days, patient will attend all scheduled medical appointments: Melissa Mode, PA-C with [facility]  Interventions:  . Evaluation of current treatment plan related to OA and patient's adherence to plan as established by provider. . Advised patient  to keep appointments with Melissa Zavala . Reviewed medications with patient and discussed Tramadol, Celebrex, and short course of prednisone . Discussed limitations on using prednisone . Discussed plans with patient for ongoing care management follow up and provided patient with direct contact information for care management team  Patient Self Care Activities:  . Performs ADL's independently . Performs IADL's independently  Please see past updates related to this goal by clicking on the "Past Updates" button in the selected goal        . URI       Current Barriers:  [name] Knowledge Deficits related to URI management  Nurse Case Manager Clinical Goal(s):  [name] Over the next 10 days, patient will continue to see improvement in URI  Interventions:  . Evaluation of current treatment plan related to URI and patient's adherence to plan as established by provider. . Reviewed medications with patient and discussed albuterol, doxycycline, and prednisone . Discussed plans with patient for ongoing care management follow up and provided patient with direct contact information for care management team . Discussed Covid testing. She has not had this done. Advised that she can have it done at the [facility]gent Care at [facility] in [place] . Encouraged her to reach out to PCP with any new or worsening symptoms or if symptoms fail to improve. Seek emergency medical care if needed.   Patient Self Care Activities:  . Performs ADL's independently . Performs IADL's independently  Initial goal documentation        Follow-up Plan The care management team will reach out to the patient again over the next 30 days.    [name], BSN, RN-BC Embedded Chronic Care Manager [facility] [place] [facility] Direct Dial: [phone]

## 2019-05-30 ENCOUNTER — Other Ambulatory Visit: Payer: Self-pay

## 2019-05-30 ENCOUNTER — Ambulatory Visit: Payer: Medicare HMO | Attending: Internal Medicine

## 2019-05-30 DIAGNOSIS — Z20822 Contact with and (suspected) exposure to covid-19: Secondary | ICD-10-CM

## 2019-05-31 LAB — NOVEL CORONAVIRUS, NAA: SARS-CoV-2, NAA: NOT DETECTED

## 2019-06-08 ENCOUNTER — Ambulatory Visit: Payer: Medicare HMO | Attending: Internal Medicine

## 2019-06-08 DIAGNOSIS — Z23 Encounter for immunization: Secondary | ICD-10-CM | POA: Insufficient documentation

## 2019-06-08 NOTE — Progress Notes (Signed)
   Covid-19 Vaccination Clinic  Name:  Melissa Zavala    MRN: [id number] DOB: January 06, 1941  06/08/2019  [name] was observed post Covid-19 immunization for 15 minutes without incidence. She was provided with Vaccine Information Sheet and instruction to access the V-Safe system.   [name] was instructed to call 911 with any severe reactions post vaccine: [name] Difficulty breathing  . Swelling of your face and throat  . A fast heartbeat  . A bad rash all over your body  . Dizziness and weakness    Immunizations Administered    Name Date Dose VIS Date Route   Pfizer COVID-19 Vaccine 06/08/2019 10:42 AM 0.3 mL 04/25/2019 Intramuscular   Manufacturer: [place]   Lot: [id number]   [place]: [id number]

## 2019-06-09 DIAGNOSIS — N858 Other specified noninflammatory disorders of uterus: Secondary | ICD-10-CM | POA: Diagnosis not present

## 2019-06-09 DIAGNOSIS — R9389 Abnormal findings on diagnostic imaging of other specified body structures: Secondary | ICD-10-CM | POA: Diagnosis not present

## 2019-06-09 DIAGNOSIS — N840 Polyp of corpus uteri: Secondary | ICD-10-CM | POA: Diagnosis not present

## 2019-06-09 DIAGNOSIS — R1909 Other intra-abdominal and pelvic swelling, mass and lump: Secondary | ICD-10-CM | POA: Diagnosis not present

## 2019-06-11 ENCOUNTER — Other Ambulatory Visit: Payer: Self-pay

## 2019-06-12 ENCOUNTER — Encounter: Payer: Self-pay | Admitting: Physician Assistant

## 2019-06-12 ENCOUNTER — Ambulatory Visit (INDEPENDENT_AMBULATORY_CARE_PROVIDER_SITE_OTHER): Payer: Medicare HMO | Admitting: Physician Assistant

## 2019-06-12 VITALS — BP 136/82 | HR 83

## 2019-06-12 DIAGNOSIS — I10 Essential (primary) hypertension: Secondary | ICD-10-CM | POA: Diagnosis not present

## 2019-06-12 DIAGNOSIS — N840 Polyp of corpus uteri: Secondary | ICD-10-CM

## 2019-06-12 NOTE — Progress Notes (Signed)
Telephone visit  Subjective: CC: Recheck on hypertension, new finding of endometrial polyp PCP: Terald Sleeper, PA-Melissa [id number] Melissa Zavala is a 79 y.o. female calls for telephone consult today. Patient provides verbal consent for consult held via phone.  Patient is identified with 2 separate identifiers.  At this time the entire area is on COVID-19 social distancing and stay home orders are in place.  Patient is of higher risk and therefore we are performing this by a virtual method.  Location of patient: Home Location of provider: [facility] Others present for call: No   This patient is having a telephone visit for her complaints.  She had a possible exposure to Covid and was tested and found to be negative.  She did need to check for her blood pressure.  She states she has had very good readings at home 136/62 and a pulse of 82.  She denies any problems with chest pain or shortness of breath.  She denies any edema in her lower legs at this time.  She has no cough and no wheezing.  We have discussed the finding that she has an endometrial polyp that is going to be evaluated.  It was found just when they were evaluating her: And she has gone through a pelvic ultrasound.  And she will be seeing gynecology again for this.  ROS: Per HPI  Allergies  Allergen Reactions  . Iodinated Diagnostic Agents Anaphylaxis  . Codeine Other (See Comments)    Hallucinations  . Diphenhydramine Other (See Comments)    Patient states "feels like somebody is inside of me clawing me out"   Past Medical History:  Diagnosis Date  . Arthritis   . Hypertension     Current Outpatient Medications:  .  albuterol (VENTOLIN HFA) 108 (90 Base) MCG/ACT inhaler, Inhale 2 puffs into the lungs every 6 (six) hours as needed for wheezing or shortness of breath., Disp: 8 g, Rfl: 0 .  amLODipine (NORVASC) 10 MG tablet, Take 1 tablet (10 mg total) by mouth daily., Disp: 30 tablet, Rfl: 11 .  benzonatate (TESSALON)  200 MG capsule, Take 1 capsule (200 mg total) by mouth 3 (three) times daily as needed., Disp: 30 capsule, Rfl: 1 .  celecoxib (CELEBREX) 200 MG capsule, TAKE 1 CAPSULE BY MOUTH EVERY DAY, Disp: 30 capsule, Rfl: 5 .  conjugated estrogens (PREMARIN) vaginal cream, Place 1 Applicatorful vaginally daily., Disp: 42.5 g, Rfl: 12 .  esomeprazole (NEXIUM) 40 MG capsule, TAKE (1) CAPSULE DAILY., Disp: 30 capsule, Rfl: 11 .  fluconazole (DIFLUCAN) 150 MG tablet, 1 po q week x 4 weeks, Disp: 4 tablet, Rfl: 0 .  furosemide (LASIX) 20 MG tablet, Take 2 tablets (40 mg total) by mouth daily. TAKE 1 TABLET BY MOUTH EVERY DAY WHEN LOTS OF SWELLING, Disp: 60 tablet, Rfl: 5 .  gabapentin (NEURONTIN) 100 MG capsule, Take 1 capsule (100 mg total) by mouth 3 (three) times daily., Disp: 90 capsule, Rfl: 5 .  lisinopril (ZESTRIL) 30 MG tablet, Take 1 tablet (30 mg total) by mouth daily., Disp: 30 tablet, Rfl: 11 .  nystatin (MYCOSTATIN/NYSTOP) powder, Apply topically 4 (four) times daily., Disp: 60 g, Rfl: 5 .  potassium chloride (KLOR-CON) 10 MEQ tablet, Take 1 tablet (10 mEq total) by mouth 2 (two) times daily., Disp: 60 tablet, Rfl: 11 .  traMADol (ULTRAM) 50 MG tablet, Take 2 tablets (100 mg total) by mouth every 6 (six) hours as needed., Disp: 120 tablet, Rfl:  5 .  triamcinolone cream (KENALOG) 0.1 %, Apply 1 application topically 2 (two) times daily., Disp: 30 g, Rfl: 0  Current Facility-Administered Medications:  .  cyanocobalamin ((VITAMIN B-12)) injection 1,000 mcg, 1,000 mcg, Intramuscular, Q30 days, Particia Nearing S, PA-Melissa, 1,000 mcg at 05/21/19 1037  Assessment/ Plan: 79 y.o. female   1. Essential hypertension Continue medications  2. Endometrial polyp Follow gynecology   Return in about 3 months (around 09/10/2019).  Continue all other maintenance medications as listed above.  Start time: 10:12 AM End time: 10:25 AM  No orders of the defined types were placed in this encounter.   Particia Nearing  PA-Melissa [place] [phone]

## 2019-06-16 ENCOUNTER — Encounter: Payer: Self-pay | Admitting: Physician Assistant

## 2019-06-23 ENCOUNTER — Other Ambulatory Visit: Payer: Self-pay

## 2019-06-23 ENCOUNTER — Ambulatory Visit (INDEPENDENT_AMBULATORY_CARE_PROVIDER_SITE_OTHER): Payer: Medicare HMO | Admitting: *Deleted

## 2019-06-23 DIAGNOSIS — E538 Deficiency of other specified B group vitamins: Secondary | ICD-10-CM | POA: Diagnosis not present

## 2019-06-26 ENCOUNTER — Other Ambulatory Visit: Payer: Self-pay

## 2019-06-27 ENCOUNTER — Ambulatory Visit (INDEPENDENT_AMBULATORY_CARE_PROVIDER_SITE_OTHER): Payer: Medicare HMO

## 2019-06-27 ENCOUNTER — Ambulatory Visit (INDEPENDENT_AMBULATORY_CARE_PROVIDER_SITE_OTHER): Payer: Medicare HMO | Admitting: Physician Assistant

## 2019-06-27 ENCOUNTER — Encounter: Payer: Self-pay | Admitting: Physician Assistant

## 2019-06-27 VITALS — BP 143/76 | HR 97 | Temp 99.1°F | Ht 60.0 in | Wt 222.0 lb

## 2019-06-27 DIAGNOSIS — M545 Low back pain, unspecified: Secondary | ICD-10-CM

## 2019-06-27 DIAGNOSIS — M79605 Pain in left leg: Secondary | ICD-10-CM | POA: Diagnosis not present

## 2019-06-27 DIAGNOSIS — R0609 Other forms of dyspnea: Secondary | ICD-10-CM

## 2019-06-27 DIAGNOSIS — R7309 Other abnormal glucose: Secondary | ICD-10-CM | POA: Diagnosis not present

## 2019-06-27 DIAGNOSIS — R0600 Dyspnea, unspecified: Secondary | ICD-10-CM

## 2019-06-27 DIAGNOSIS — I10 Essential (primary) hypertension: Secondary | ICD-10-CM | POA: Diagnosis not present

## 2019-06-27 DIAGNOSIS — R600 Localized edema: Secondary | ICD-10-CM | POA: Diagnosis not present

## 2019-06-27 LAB — BAYER DCA HB A1C WAIVED: HB A1C (BAYER DCA - WAIVED): 5.8 % (ref <7.0)

## 2019-06-27 MED ORDER — LISINOPRIL 40 MG PO TABS: 40.0000 mg | ORAL_TABLET | Freq: Every day | ORAL | 2 refills | Status: DC

## 2019-06-27 MED ORDER — PREDNISONE 10 MG (21) PO TBPK: ORAL_TABLET | ORAL | 0 refills | Status: DC

## 2019-06-27 NOTE — Addendum Note (Signed)
Addended by: Terald Sleeper on: 06/27/2019 11:24 AM   Modules accepted: Orders

## 2019-06-27 NOTE — Addendum Note (Signed)
Addended by: Terald Sleeper on: 06/27/2019 11:38 AM   Modules accepted: Orders

## 2019-06-27 NOTE — Addendum Note (Signed)
Addended by: Terald Sleeper on: 06/27/2019 11:21 AM   Modules accepted: Orders

## 2019-06-27 NOTE — Progress Notes (Signed)
Acute Office Visit  Subjective:    Patient ID: Melissa Zavala, female    DOB: Dec 28, 1940, 79 y.o.   MRN: [id number]  Chief Complaint  Patient presents with  . Leg Pain    Left. Throbbing pain    HPI  Patient has had increasing lower leg edema and even the past few days some discoloration.  Her lower legs do have still discoloration and hand to the skin.  There have been no skin breaks.  Patient is expressing dyspnea on exertion and when talking for very long.  It is made worse after walking and talking for very long.  It is also made worse when she bends over.  She has never had a cardiac work-up.  She states she may feel some palpitations.  But no significant rhythm problems is described.  Despite using a fluid pill she cannot tell a difference.  She does have compression Stockings at home and will try to get back on those on a regular basis.  We will have labs performed today her last labs were 6 months ago and they were good.  She also is having increasing low back pain with radiation down the left leg at this time.  She has had it on the right leg leg in the past.  She has not had any specific injury or falls and does not feel weak in that leg.  We will get an x-ray of her low back today. Past Medical History:  Diagnosis Date  . Arthritis   . Hypertension     Past Surgical History:  Procedure Laterality Date  . TOTAL KNEE ARTHROPLASTY    . TOTAL KNEE ARTHROPLASTY Bilateral   . TUBAL LIGATION      Family History  Problem Relation Age of Onset  . Cancer Mother        Breast    Social History   Socioeconomic History  . Marital status: Legally Separated    Spouse name: Not on file  . Number of children: 7  . Years of education: Not on file  . Highest education level: 8th grade  Occupational History  . Not on file  Tobacco Use  . Smoking status: Never Smoker  . Smokeless tobacco: Never Used  Substance and Sexual Activity  . Alcohol use: No  . Drug use: No  .  Sexual activity: Not Currently  Other Topics Concern  . Not on file  Social History Narrative  . Not on file   Social Determinants of Health   Financial Resource Strain: Low Risk   . Difficulty of Paying Living Expenses: Not hard at all  Food Insecurity: No Food Insecurity  . Worried About Charity fundraiser in the Last Year: Never true  . Ran Out of Food in the Last Year: Never true  Transportation Needs: No Transportation Needs  . Lack of Transportation (Medical): No  . Lack of Transportation (Non-Medical): No  Physical Activity: Inactive  . Days of Exercise per Week: 0 days  . Minutes of Exercise per Session: 0 min  Stress: No Stress Concern Present  . Feeling of Stress : Not at all  Social Connections: Slightly Isolated  . Frequency of Communication with Friends and Family: More than three times a week  . Frequency of Social Gatherings with Friends and Family: Once a week  . Attends Religious Services: More than 4 times per year  . Active Member of Clubs or Organizations: Yes  . Attends Archivist Meetings: More  than 4 times per year  . Marital Status: Separated  Intimate Partner Violence: Not At Risk  . Fear of Current or Ex-Partner: No  . Emotionally Abused: No  . Physically Abused: No  . Sexually Abused: No    Outpatient Medications Prior to Visit  Medication Sig Dispense Refill  . amLODipine (NORVASC) 10 MG tablet Take 1 tablet (10 mg total) by mouth daily. 30 tablet 11  . celecoxib (CELEBREX) 200 MG capsule TAKE 1 CAPSULE BY MOUTH EVERY DAY 30 capsule 5  . esomeprazole (NEXIUM) 40 MG capsule TAKE (1) CAPSULE DAILY. 30 capsule 11  . furosemide (LASIX) 20 MG tablet Take 2 tablets (40 mg total) by mouth daily. TAKE 1 TABLET BY MOUTH EVERY DAY WHEN LOTS OF SWELLING 60 tablet 5  . potassium chloride (KLOR-CON) 10 MEQ tablet Take 1 tablet (10 mEq total) by mouth 2 (two) times daily. 60 tablet 11  . traMADol (ULTRAM) 50 MG tablet Take 2 tablets (100 mg total) by  mouth every 6 (six) hours as needed. 120 tablet 5  . gabapentin (NEURONTIN) 100 MG capsule Take 1 capsule (100 mg total) by mouth 3 (three) times daily. 90 capsule 5  . potassium citrate (UROCIT-K) 10 MEQ (1080 MG) SR tablet Take 10 mEq by mouth daily.    [name] albuterol (VENTOLIN HFA) 108 (90 Base) MCG/ACT inhaler Inhale 2 puffs into the lungs every 6 (six) hours as needed for wheezing or shortness of breath. 8 g 0  . benzonatate (TESSALON) 200 MG capsule Take 1 capsule (200 mg total) by mouth 3 (three) times daily as needed. 30 capsule 1  . conjugated estrogens (PREMARIN) vaginal cream Place 1 Applicatorful vaginally daily. 42.5 g 12  . fluconazole (DIFLUCAN) 150 MG tablet 1 po q week x 4 weeks 4 tablet 0  . lisinopril (ZESTRIL) 30 MG tablet Take 1 tablet (30 mg total) by mouth daily. 30 tablet 11  . nystatin (MYCOSTATIN/NYSTOP) powder Apply topically 4 (four) times daily. 60 g 5  . triamcinolone cream (KENALOG) 0.1 % Apply 1 application topically 2 (two) times daily. 30 g 0   Facility-Administered Medications Prior to Visit  Medication Dose Route Frequency Provider Last Rate Last Admin  . cyanocobalamin ((VITAMIN B-12)) injection 1,000 mcg  1,000 mcg Intramuscular Q30 days Particia Nearing S, PA-C   1,000 mcg at 06/23/19 1030    Allergies  Allergen Reactions  . Iodinated Diagnostic Agents Anaphylaxis  . Codeine Other (See Comments)    Hallucinations  . Diphenhydramine Other (See Comments)    Patient states "feels like somebody is inside of me clawing me out"    Review of Systems  Constitutional: Negative.  Negative for activity change, fatigue and fever.  HENT: Negative.   Eyes: Negative.   Respiratory: Negative.  Negative for cough.   Cardiovascular: Positive for chest pain, palpitations and leg swelling.  Gastrointestinal: Negative.  Negative for abdominal pain.  Endocrine: Negative.   Genitourinary: Negative.  Negative for dysuria.  Musculoskeletal: Positive for arthralgias, joint  swelling and myalgias.  Skin: Negative.   Neurological: Negative.        Objective:    Physical Exam Constitutional:      General: She is not in acute distress.    Appearance: Normal appearance. She is well-developed.  HENT:     Head: Normocephalic and atraumatic.  Cardiovascular:     Rate and Rhythm: Normal rate and regular rhythm.     Pulses: Normal pulses.     Heart sounds: Normal heart sounds.  Pulmonary:     Effort: Pulmonary effort is normal.  Skin:    General: Skin is warm and dry.     Findings: No rash.  Neurological:     Mental Status: She is alert and oriented to person, place, and time.     Deep Tendon Reflexes: Reflexes are normal and symmetric.     BP (!) 143/76   Pulse 97   Temp 99.1 F (37.3 C)   Ht 5' (1.524 m)   Wt 222 lb (100.7 kg)   SpO2 97%   BMI 43.36 kg/m  Wt Readings from Last 3 Encounters:  06/27/19 222 lb (100.7 kg)  05/06/19 213 lb 3.2 oz (96.7 kg)  02/03/19 209 lb (94.8 kg)    Health Maintenance Due  Topic Date Due  . DEXA SCAN  11/07/2005    There are no preventive care reminders to display for this patient.   Lab Results  Component Value Date   TSH 0.824 12/13/2018   Lab Results  Component Value Date   WBC 6.0 12/13/2018   HGB 11.4 12/13/2018   HCT 35.4 12/13/2018   MCV 83 12/13/2018   PLT 267 12/13/2018   Lab Results  Component Value Date   NA 142 12/13/2018   K 4.4 12/13/2018   CO2 22 12/13/2018   GLUCOSE 101 (H) 12/13/2018   BUN 11 12/13/2018   CREATININE 0.60 12/13/2018   BILITOT <0.2 12/13/2018   ALKPHOS 64 12/13/2018   AST 15 12/13/2018   ALT 11 12/13/2018   PROT 6.6 12/13/2018   ALBUMIN 4.1 12/13/2018   CALCIUM 9.8 12/13/2018   ANIONGAP 8 07/18/2016   Lab Results  Component Value Date   CHOL 204 (H) 12/13/2018   Lab Results  Component Value Date   HDL 93 12/13/2018   Lab Results  Component Value Date   LDLCALC 99 12/13/2018   Lab Results  Component Value Date   TRIG 59 12/13/2018   Lab  Results  Component Value Date   CHOLHDL 2.2 12/13/2018   No results found for: HGBA1C     Assessment & Plan:   Problem List Items Addressed This Visit      Other   Edema   Relevant Orders   Ambulatory referral to Cardiology    Other Visit Diagnoses    Lumbar pain    -  Primary   Relevant Orders   DG Lumbar Spine 2-3 Views   Left leg pain       Relevant Orders   DG Lumbar Spine 2-3 Views   Dyspnea on exertion       Relevant Orders   Ambulatory referral to Cardiology       No orders of the defined types were placed in this encounter.    Terald Sleeper, PA-C

## 2019-06-28 LAB — CMP14+EGFR
ALT: 15 IU/L (ref 0–32)
AST: 18 IU/L (ref 0–40)
Albumin/Globulin Ratio: 1.8 (ref 1.2–2.2)
Albumin: 4.2 g/dL (ref 3.7–4.7)
Alkaline Phosphatase: 74 IU/L (ref 39–117)
BUN/Creatinine Ratio: 14 (ref 12–28)
BUN: 10 mg/dL (ref 8–27)
Bilirubin Total: <0.2 mg/dL (ref 0.0–1.2)
CO2: 23 mmol/L (ref 20–29)
Calcium: 9.9 mg/dL (ref 8.7–10.3)
Chloride: 104 mmol/L (ref 96–106)
Creatinine, Ser: 0.72 mg/dL (ref 0.57–1.00)
GFR calc Af Amer: 93 mL/min/{1.73_m2} (ref >59)
GFR calc non Af Amer: 80 mL/min/{1.73_m2} (ref >59)
Globulin, Total: 2.3 g/dL (ref 1.5–4.5)
Glucose: 95 mg/dL (ref 65–99)
Potassium: 4.2 mmol/L (ref 3.5–5.2)
Sodium: 142 mmol/L (ref 134–144)
Total Protein: 6.5 g/dL (ref 6.0–8.5)

## 2019-06-28 LAB — CBC WITH DIFFERENTIAL/PLATELET
Basophils Absolute: 0.1 10*3/uL (ref 0.0–0.2)
Basos: 1 %
EOS (ABSOLUTE): 0.1 10*3/uL (ref 0.0–0.4)
Eos: 2 %
Hematocrit: 37.1 % (ref 34.0–46.6)
Hemoglobin: 12.1 g/dL (ref 11.1–15.9)
Immature Grans (Abs): 0 10*3/uL (ref 0.0–0.1)
Immature Granulocytes: 0 %
Lymphocytes Absolute: 2.3 10*3/uL (ref 0.7–3.1)
Lymphs: 28 %
MCH: 27 pg (ref 26.6–33.0)
MCHC: 32.6 g/dL (ref 31.5–35.7)
MCV: 83 fL (ref 79–97)
Monocytes Absolute: 0.6 10*3/uL (ref 0.1–0.9)
Monocytes: 7 %
Neutrophils Absolute: 5.3 10*3/uL (ref 1.4–7.0)
Neutrophils: 62 %
Platelets: 327 10*3/uL (ref 150–450)
RBC: 4.48 x10E6/uL (ref 3.77–5.28)
RDW: 13.5 % (ref 11.7–15.4)
WBC: 8.4 10*3/uL (ref 3.4–10.8)

## 2019-06-28 LAB — LIPID PANEL
Chol/HDL Ratio: 2.3 ratio (ref 0.0–4.4)
Cholesterol, Total: 212 mg/dL — ABNORMAL HIGH (ref 100–199)
HDL: 93 mg/dL (ref >39)
LDL Chol Calc (NIH): 99 mg/dL (ref 0–99)
Triglycerides: 118 mg/dL (ref 0–149)
VLDL Cholesterol Cal: 20 mg/dL (ref 5–40)

## 2019-06-28 LAB — TSH: TSH: 0.536 u[IU]/mL (ref 0.450–4.500)

## 2019-06-30 ENCOUNTER — Ambulatory Visit: Payer: Medicare HMO | Attending: Internal Medicine

## 2019-06-30 DIAGNOSIS — Z23 Encounter for immunization: Secondary | ICD-10-CM | POA: Insufficient documentation

## 2019-06-30 NOTE — Progress Notes (Signed)
   Covid-19 Vaccination Clinic  Name:  Melissa Zavala    MRN: [id number] DOB: 1940/07/23  06/30/2019  [name] was observed post Covid-19 immunization for 15 minutes without incidence. She was provided with Vaccine Information Sheet and instruction to access the V-Safe system.   [name] was instructed to call 911 with any severe reactions post vaccine: [name] Difficulty breathing  . Swelling of your face and throat  . A fast heartbeat  . A bad rash all over your body  . Dizziness and weakness    Immunizations Administered    Name Date Dose VIS Date Route   Pfizer COVID-19 Vaccine 06/30/2019  8:46 AM 0.3 mL 04/25/2019 Intramuscular   Manufacturer: [place]   Lot: [id number]   [place]: [id number]

## 2019-07-01 ENCOUNTER — Ambulatory Visit (INDEPENDENT_AMBULATORY_CARE_PROVIDER_SITE_OTHER): Payer: Medicare HMO | Admitting: *Deleted

## 2019-07-01 DIAGNOSIS — M545 Low back pain, unspecified: Secondary | ICD-10-CM

## 2019-07-01 DIAGNOSIS — I10 Essential (primary) hypertension: Secondary | ICD-10-CM

## 2019-07-01 DIAGNOSIS — R600 Localized edema: Secondary | ICD-10-CM

## 2019-07-01 DIAGNOSIS — R0609 Other forms of dyspnea: Secondary | ICD-10-CM

## 2019-07-01 DIAGNOSIS — M8949 Other hypertrophic osteoarthropathy, multiple sites: Secondary | ICD-10-CM | POA: Diagnosis not present

## 2019-07-01 NOTE — Chronic Care Management (AMB) (Signed)
Chronic Care Management   Follow Up Note   07/01/2019 Name: Melissa Zavala MRN: [id number] DOB: 05/15/1941  Referred by: Terald Sleeper, PA-C Reason for referral : Chronic Care Management (RN follow up)   Melissa Zavala is a 79 y.o. year old female who is a primary care patient of Terald Sleeper, PA-C. The CCM team was consulted for assistance with chronic disease management and care coordination needs.    Review of patient status, including review of consultants reports, relevant laboratory and other test results, and collaboration with appropriate care team members and the patient's provider was performed as part of comprehensive patient evaluation and provision of chronic care management services.     Outpatient Encounter Medications as of 07/01/2019  Medication Sig  . celecoxib (CELEBREX) 200 MG capsule TAKE 1 CAPSULE BY MOUTH EVERY DAY  . esomeprazole (NEXIUM) 40 MG capsule TAKE (1) CAPSULE DAILY.  . furosemide (LASIX) 20 MG tablet Take 2 tablets (40 mg total) by mouth daily. TAKE 1 TABLET BY MOUTH EVERY DAY WHEN LOTS OF SWELLING  . lisinopril (ZESTRIL) 40 MG tablet Take 1 tablet (40 mg total) by mouth daily.  . potassium chloride (KLOR-CON) 10 MEQ tablet Take 1 tablet (10 mEq total) by mouth 2 (two) times daily.  . potassium citrate (UROCIT-K) 10 MEQ (1080 MG) SR tablet Take 10 mEq by mouth daily.  . predniSONE (STERAPRED UNI-PAK 21 TAB) 10 MG (21) TBPK tablet As directed x 6 days  . traMADol (ULTRAM) 50 MG tablet Take 2 tablets (100 mg total) by mouth every 6 (six) hours as needed.   Facility-Administered Encounter Medications as of 07/01/2019  Medication  . cyanocobalamin ((VITAMIN B-12)) injection 1,000 mcg     RN Care Plan           This Visit's Progress   . COMPLETED: Follow Up On Adnexal Mass        Current Barriers:  [name] Knowledge Deficits related to adnexal lesion  found on bone scan in a patient with osteoarthritis   Nurse Case Manager Clinical Goal(s):  [name] Over  the next 30 days, patient will attend all scheduled medical appointments: Gynecology/oncology appointment at [facility]  Interventions:  . Chart reviewed including notes from [facility] o Results of biopsy were negative . Talked with patient by telephone o Patient is pleased with negative results  Patient Self Care Activities:  . Performs ADL's independently . Performs IADL's independently  Please see past updates related to this goal by clicking on the "Past Updates" button in the selected goal      . Osteoarthritis Management        Current Barriers:  . Chronic Disease Management support and education needs related to osteoarthritis  Nurse Case Manager Clinical Goal(s):  [name] Over the next 30 days, patient will keep all scheduled medical appointments . Over the next 30 days, patient will see improvement in lower back pain  Interventions:  . Evaluation of current treatment plan related to OA and patient's adherence to plan as established by provider. . Reviewed medications with patient and discussed Tramadol, Celebrex, and another short course of prednisone . Chart reviewed including recent office notes and xrays . Discussed xray results: Multilevel degenerative change without acute abnormality. . Discussed plans with patient for ongoing care management follow up and provided patient with direct contact information for care management team . Advised patient to reach out to PCP at [phone] with any new or worsening symptoms or if symptoms do  not improve with treatment  Patient Self Care Activities:  . Performs ADL's independently . Performs IADL's independently  Please see past updates related to this goal by clicking on the "Past Updates" button in the selected goal      . Hypertension Management       Current Barriers:  . Chronic Disease Management support and education needs related to hypertension . Possible side effect of amlodipine-lower extremity  swelling  Nurse Case Manager Clinical Goal(s):  [name] Over the next 30 days, patient will work with cardiologist to address needs related to hypertension and dyspnea . Over the next 30 days, patient will attend all scheduled medical appointments: Cardiology 07/07/19  Interventions:  . Evaluation of current treatment plan related to hypertension and patient's adherence to plan as established by provider. . Reviewed medications with patient and discussed discontinuation of amlodipine due to lower extremity edema and increase in lisinopril to manage HTN . Discussed plans with patient for ongoing care management follow up and provided patient with direct contact information for care management team . Advised patient, providing education and rationale, to monitor blood pressure daily and record, calling [phone] for findings outside established parameters.  . Reviewed upcoming appointments: Cardiology 07/07/19 . Advised to reach out to PCP or call 911 with any worsening SOB or if any new symptoms develop  Patient Self Care Activities:  . Performs ADL's independently . Performs IADL's independently  Initial goal documentation     . COMPLETED: URI       Current Barriers:  [name] Knowledge Deficits related to URI management  Nurse Case Manager Clinical Goal(s):  [name] Over the next 10 days, patient will continue to see improvement in URI  Interventions:  . Evaluation of current treatment plan related to URI and patient's adherence to plan as established by provider. . Reviewed medications with patient and discussed albuterol, doxycycline, and prednisone . Discussed plans with patient for ongoing care management follow up and provided patient with direct contact information for care management team . Discussed Covid testing. She has not had this done. Advised that she can have it done at the [facility] at [facility] in [place] . Encouraged her to reach out to PCP with any new or worsening  symptoms or if symptoms fail to improve. Seek emergency medical care if needed.   Patient Self Care Activities:  . Performs ADL's independently . Performs IADL's independently  Initial goal documentation Covid testing was negative. URI symptoms have resolved. Patient received Covid Vaccine yesterday.         Follow-up Plan:   The care management team will reach out to the patient again over the next 45 days.    [name], BSN, RN-BC Embedded Chronic Care Manager [facility] Direct Dial: [phone]

## 2019-07-01 NOTE — Patient Instructions (Signed)
Visit Information  Goals Addressed            This Visit's Progress     Patient Stated   . COMPLETED: Follow Up On Adnexal Mass (pt-stated)       Current Barriers:  [name] Knowledge Deficits related to adnexal lesion  found on bone scan in a patient with osteoarthritis   Nurse Case Manager Clinical Goal(s):  [name] Over the next 30 days, patient will attend all scheduled medical appointments: Gynecology/oncology appointment at [facility]  Interventions:  . Chart reviewed including notes from [facility] o Results of biopsy were negative . Talked with patient by telephone o Patient is pleased with negative results  Patient Self Care Activities:  . Performs ADL's independently . Performs IADL's independently  Please see past updates related to this goal by clicking on the "Past Updates" button in the selected goal      . Osteoarthritis Management (pt-stated)       Current Barriers:  . Chronic Disease Management support and education needs related to osteoarthritis  Nurse Case Manager Clinical Goal(s):  [name] Over the next 30 days, patient will keep all scheduled medical appointments . Over the next 30 days, patient will see improvement in lower back pain  Interventions:  . Evaluation of current treatment plan related to OA and patient's adherence to plan as established by provider. . Reviewed medications with patient and discussed Tramadol, Celebrex, and another short course of prednisone . Chart reviewed including recent office notes and xrays . Discussed xray results: Multilevel degenerative change without acute abnormality. . Discussed plans with patient for ongoing care management follow up and provided patient with direct contact information for care management team . Advised patient to reach out to PCP at [phone] with any new or worsening symptoms or if symptoms do not improve with treatment  Patient Self Care Activities:  . Performs ADL's  independently . Performs IADL's independently  Please see past updates related to this goal by clicking on the "Past Updates" button in the selected goal        Other   . Hypertension Management       Current Barriers:  . Chronic Disease Management support and education needs related to hypertension . Possible side effect of amlodipine-lower extremity swelling  Nurse Case Manager Clinical Goal(s):  [name] Over the next 30 days, patient will work with cardiologist to address needs related to hypertension and dyspnea . Over the next 30 days, patient will attend all scheduled medical appointments: Cardiology 07/07/19  Interventions:  . Evaluation of current treatment plan related to hypertension and patient's adherence to plan as established by provider. . Reviewed medications with patient and discussed discontinuation of amlodipine due to lower extremity edema and increase in lisinopril to manage HTN . Discussed plans with patient for ongoing care management follow up and provided patient with direct contact information for care management team . Advised patient, providing education and rationale, to monitor blood pressure daily and record, calling [phone] for findings outside established parameters.  . Reviewed upcoming appointments: Cardiology 07/07/19 . Advised to reach out to PCP or call 911 with any worsening SOB or if any new symptoms develop  Patient Self Care Activities:  . Performs ADL's independently . Performs IADL's independently  Initial goal documentation     . COMPLETED: URI       Current Barriers:  [name] Knowledge Deficits related to URI management  Nurse Case Manager Clinical Goal(s):  [name] Over the next 10 days,  patient will continue to see improvement in URI  Interventions:  . Evaluation of current treatment plan related to URI and patient's adherence to plan as established by provider. . Reviewed medications with patient and discussed albuterol, doxycycline, and  prednisone . Discussed plans with patient for ongoing care management follow up and provided patient with direct contact information for care management team . Discussed Covid testing. She has not had this done. Advised that she can have it done at the [facility] at [facility] in [place] . Encouraged her to reach out to PCP with any new or worsening symptoms or if symptoms fail to improve. Seek emergency medical care if needed.   Patient Self Care Activities:  . Performs ADL's independently . Performs IADL's independently  Initial goal documentation Covid testing was negative. URI symptoms have resolved. Patient received Covid Vaccine yesterday.       The care management team will reach out to the patient again over the next 45 days.   [name], BSN, RN-BC Embedded Chronic Care Manager [facility] Direct Dial: [phone]   The patient verbalized understanding of instructions provided today and declined a print copy of patient instruction materials.

## 2019-07-07 ENCOUNTER — Other Ambulatory Visit: Payer: Self-pay

## 2019-07-07 ENCOUNTER — Encounter: Payer: Self-pay | Admitting: Cardiology

## 2019-07-07 ENCOUNTER — Ambulatory Visit: Payer: Medicare HMO | Admitting: Cardiology

## 2019-07-07 ENCOUNTER — Other Ambulatory Visit: Payer: Self-pay | Admitting: Physician Assistant

## 2019-07-07 VITALS — BP 147/79 | HR 81 | Ht 61.0 in | Wt 202.0 lb

## 2019-07-07 DIAGNOSIS — I10 Essential (primary) hypertension: Secondary | ICD-10-CM | POA: Insufficient documentation

## 2019-07-07 DIAGNOSIS — R600 Localized edema: Secondary | ICD-10-CM | POA: Diagnosis not present

## 2019-07-07 DIAGNOSIS — R0602 Shortness of breath: Secondary | ICD-10-CM | POA: Diagnosis not present

## 2019-07-07 DIAGNOSIS — R011 Cardiac murmur, unspecified: Secondary | ICD-10-CM

## 2019-07-07 DIAGNOSIS — M150 Primary generalized (osteo)arthritis: Secondary | ICD-10-CM

## 2019-07-07 DIAGNOSIS — M159 Polyosteoarthritis, unspecified: Secondary | ICD-10-CM

## 2019-07-07 NOTE — Patient Instructions (Signed)
Medication Instructions:   Your physician recommends that you continue on your current medications as directed. Please refer to the Current Medication list given to you today.  Labwork:  NONE  Testing/Procedures: Your physician has requested that you have an echocardiogram. Echocardiography is a painless test that uses sound waves to create images of your heart. It provides your doctor with information about the size and shape of your heart and how well your heart's chambers and valves are working. This procedure takes approximately one hour. There are no restrictions for this procedure.  Follow-Up:  Your physician recommends that you schedule a follow-up appointment in: 6 weeks (virtual).  Any Other Special Instructions Will Be Listed Below (If Applicable).  If you need a refill on your cardiac medications before your next appointment, please call your pharmacy.

## 2019-07-07 NOTE — Progress Notes (Signed)
Clinical Summary Melissa Zavala is a 79 y.o.female referred as new consult by PA Jones for the following medical problems.    1. DOE/LE edema - ongoing for last few months - DOE just walking in to the house from her car which is new for her - bilateral LE edema - no orthopnea - no significant chest pain - no recent coughing or wheezing.  - takes lasix prn which controls the swelling. Typically takes every other day, will take daily if needed   Past Medical History:  Diagnosis Date  . Arthritis   . Hypertension      Allergies  Allergen Reactions  . Iodinated Diagnostic Agents Anaphylaxis  . Codeine Other (See Comments)    Hallucinations  . Diphenhydramine Other (See Comments)    Patient states "feels like somebody is inside of me clawing me out"     Current Outpatient Medications  Medication Sig Dispense Refill  . celecoxib (CELEBREX) 200 MG capsule TAKE 1 CAPSULE BY MOUTH EVERY DAY 30 capsule 5  . esomeprazole (NEXIUM) 40 MG capsule TAKE (1) CAPSULE DAILY. 30 capsule 11  . furosemide (LASIX) 20 MG tablet Take 2 tablets (40 mg total) by mouth daily. TAKE 1 TABLET BY MOUTH EVERY DAY WHEN LOTS OF SWELLING 60 tablet 5  . lisinopril (ZESTRIL) 40 MG tablet Take 1 tablet (40 mg total) by mouth daily. 30 tablet 2  . potassium chloride (KLOR-CON) 10 MEQ tablet Take 1 tablet (10 mEq total) by mouth 2 (two) times daily. 60 tablet 11  . potassium citrate (UROCIT-K) 10 MEQ (1080 MG) SR tablet Take 10 mEq by mouth daily.    . predniSONE (STERAPRED UNI-PAK 21 TAB) 10 MG (21) TBPK tablet As directed x 6 days 21 tablet 0  . traMADol (ULTRAM) 50 MG tablet Take 2 tablets (100 mg total) by mouth every 6 (six) hours as needed. 120 tablet 5   Current Facility-Administered Medications  Medication Dose Route Frequency Provider Last Rate Last Admin  . cyanocobalamin ((VITAMIN B-12)) injection 1,000 mcg  1,000 mcg Intramuscular Q30 days Terald Sleeper, PA-C   1,000 mcg at 06/23/19 1030      Past Surgical History:  Procedure Laterality Date  . TOTAL KNEE ARTHROPLASTY    . TOTAL KNEE ARTHROPLASTY Bilateral   . TUBAL LIGATION       Allergies  Allergen Reactions  . Iodinated Diagnostic Agents Anaphylaxis  . Codeine Other (See Comments)    Hallucinations  . Diphenhydramine Other (See Comments)    Patient states "feels like somebody is inside of me clawing me out"      Family History  Problem Relation Age of Onset  . Cancer Mother        Breast     Social History Ms. Craddock reports that she has never smoked. She has never used smokeless tobacco. Melissa Zavala reports no history of alcohol use.   Review of Systems CONSTITUTIONAL: No weight loss, fever, chills, weakness or fatigue.  HEENT: Eyes: No visual loss, blurred vision, double vision or yellow sclerae.No hearing loss, sneezing, congestion, runny nose or sore throat.  SKIN: No rash or itching.  CARDIOVASCULAR: per hpi RESPIRATORY: per hpi GASTROINTESTINAL: No anorexia, nausea, vomiting or diarrhea. No abdominal pain or blood.  GENITOURINARY: No burning on urination, no polyuria NEUROLOGICAL: No headache, dizziness, syncope, paralysis, ataxia, numbness or tingling in the extremities. No change in bowel or bladder control.  MUSCULOSKELETAL: No muscle, back pain, joint pain or stiffness.  LYMPHATICS: No enlarged  nodes. No history of splenectomy.  PSYCHIATRIC: No history of depression or anxiety.  ENDOCRINOLOGIC: No reports of sweating, cold or heat intolerance. No polyuria or polydipsia.  [name]   Physical Examination Today's Vitals   07/07/19 0845  BP: (!) 147/79  Pulse: 81  SpO2: 97%  Weight: 202 lb (91.6 kg)  Height: 5\' 1"  (1.549 m)   Body mass index is 38.17 kg/m.  Gen: resting comfortably, no acute distress HEENT: no scleral icterus, pupils equal round and reactive, no palptable cervical adenopathy,  CV: RRR, 3/6 systolic murmur rusb, no jvd Resp: Clear to auscultation bilaterally GI:  abdomen is soft, non-tender, non-distended, normal bowel sounds, no hepatosplenomegaly MSK: extremities are warm, no edema.  Skin: warm, no rash Neuro:  no focal deficits Psych: appropriate affect  Assessment and Plan  1. SOB/DOE - will obtain echo to evaluate for any underlying cardiac dysfunction as the etiology - EKG today shows SR, no acute ischemic changes  2. Heart murmur - obtain echo in setting of murmur, SOB/DOE, and LE edema         Arnoldo Lenis, M.D.,

## 2019-07-12 DIAGNOSIS — M791 Myalgia, unspecified site: Secondary | ICD-10-CM | POA: Diagnosis not present

## 2019-07-12 DIAGNOSIS — J029 Acute pharyngitis, unspecified: Secondary | ICD-10-CM | POA: Diagnosis not present

## 2019-07-12 DIAGNOSIS — R519 Headache, unspecified: Secondary | ICD-10-CM | POA: Diagnosis not present

## 2019-07-12 DIAGNOSIS — Z20822 Contact with and (suspected) exposure to covid-19: Secondary | ICD-10-CM | POA: Diagnosis not present

## 2019-07-12 DIAGNOSIS — R52 Pain, unspecified: Secondary | ICD-10-CM | POA: Diagnosis not present

## 2019-07-17 ENCOUNTER — Ambulatory Visit (INDEPENDENT_AMBULATORY_CARE_PROVIDER_SITE_OTHER): Payer: Medicare HMO

## 2019-07-17 ENCOUNTER — Other Ambulatory Visit: Payer: Medicare HMO

## 2019-07-17 ENCOUNTER — Other Ambulatory Visit: Payer: Self-pay

## 2019-07-17 DIAGNOSIS — R0602 Shortness of breath: Secondary | ICD-10-CM | POA: Diagnosis not present

## 2019-07-21 ENCOUNTER — Other Ambulatory Visit: Payer: Self-pay

## 2019-07-22 ENCOUNTER — Ambulatory Visit: Payer: Medicare HMO

## 2019-07-23 ENCOUNTER — Telehealth: Payer: Self-pay | Admitting: *Deleted

## 2019-07-23 NOTE — Telephone Encounter (Signed)
Pt wanting to know echo results - aware that Dr Harl Bowie hasn't reviewed them yet but would call her once we got results from him

## 2019-07-24 NOTE — Telephone Encounter (Signed)
Just resulted  JB

## 2019-07-24 NOTE — Telephone Encounter (Signed)
Pt voiced understanding - routed to pcp  

## 2019-07-24 NOTE — Telephone Encounter (Signed)
Echo shows the pumping function of the heart is normal, and actually a little bit stronger than normal. Her heart muscle is a little stiffer than normal but this is common with aging and a mild change. One heart valve has just a mild leak but nothing of significance. Overall echo looks good, will discuss further at our f/u and reassess symptoms   Zandra Abts MD

## 2019-07-31 ENCOUNTER — Telehealth: Payer: Medicare HMO

## 2019-08-07 ENCOUNTER — Other Ambulatory Visit: Payer: Self-pay | Admitting: Physician Assistant

## 2019-08-07 DIAGNOSIS — M159 Polyosteoarthritis, unspecified: Secondary | ICD-10-CM

## 2019-08-13 ENCOUNTER — Ambulatory Visit: Payer: Medicare HMO | Admitting: *Deleted

## 2019-08-13 NOTE — Chronic Care Management (AMB) (Signed)
  Chronic Care Management   Outreach Note  08/13/2019 Name: Melissa Zavala MRN: [id number] DOB: [birth year]  Referred by: No primary care provider on file. Reason for referral : Chronic Care Management (RN follow up)   An unsuccessful telephone follow-up was attempted today. The patient was referred to the case management team for assistance with care management and care coordination.   Follow Up Plan: The care management team will reach out to the patient again over the next 30 days.   [name], BSN, RN-BC Embedded Chronic Care Manager [facility] Direct Dial: [phone]

## 2019-08-14 ENCOUNTER — Telehealth: Payer: Medicare HMO | Admitting: Cardiology

## 2019-08-27 ENCOUNTER — Telehealth: Payer: Medicare HMO | Admitting: Cardiology

## 2019-08-27 NOTE — Progress Notes (Unsigned)
{Choose 1 Note Type (Video or Telephone):[phone]}   The patient was identified using 2 identifiers.  Date:  08/27/2019   ID:  Melissa Zavala, DOB 1940-08-12, MRN [id number]  {Patient [phone]::"Home"} {Provider Location:[phone]::"Home"}  PCP:  Patient, No Pcp Per  Cardiologist:  Carlyle Dolly, MD *** Electrophysiologist:  None   Evaluation Performed:  {Choose Visit [id number] Visit"}  Chief Complaint:  ***  History of Present Illness:    Melissa Zavala is a 79 y.o. female with ***  1. DOE/LE edema - ongoing for last few months - DOE just walking in to the house from her car which is new for her - bilateral LE edema - no orthopnea - no significant chest pain - no recent coughing or wheezing.  - takes lasix prn which controls the swelling. Typically takes every other day, will take daily if needed   07/2019 echo LVEF 65-70%, grade I DDX, severe LAE, mild MR  The patient {does/does not:200015} have symptoms concerning for COVID-19 infection (fever, chills, cough, or new shortness of breath).    Past Medical History:  Diagnosis Date  . Arthritis   . Hypertension    Past Surgical History:  Procedure Laterality Date  . TOTAL KNEE ARTHROPLASTY    . TOTAL KNEE ARTHROPLASTY Bilateral   . TUBAL LIGATION       No outpatient medications have been marked as taking for the 08/27/19 encounter (Appointment) with Arnoldo Lenis, MD.   Current Facility-Administered Medications for the 08/27/19 encounter (Appointment) with Arnoldo Lenis, MD  Medication  . cyanocobalamin ((VITAMIN B-12)) injection 1,000 mcg     Allergies:   Iodinated diagnostic agents, Codeine, and Diphenhydramine   Social History   Tobacco Use  . Smoking status: Never Smoker  . Smokeless tobacco: Never Used  Substance Use Topics  . Alcohol use: No  . Drug use: No     Family Hx: The patient's family history includes Cancer in her mother.  ROS:   Please see  the history of present illness.    *** All other systems reviewed and are negative.   Prior CV studies:   The following studies were reviewed today:  07/2019 echo IMPRESSIONS    1. Left ventricular ejection fraction, by estimation, is 65 to 70%. The  left ventricle has normal function. The left ventricle has no regional  wall motion abnormalities. Left ventricular diastolic parameters are  consistent with Grade I diastolic  dysfunction (impaired relaxation).  2. Right ventricular systolic function is normal. The right ventricular  size is normal. There is mildly elevated pulmonary artery systolic  pressure.  3. Left atrial size was severely dilated.  4. The mitral valve is degenerative. Mild mitral valve regurgitation.  5. The aortic valve is tricuspid. Aortic valve regurgitation is not  visualized. No aortic stenosis is present.  6. The inferior vena cava is normal in size with greater than 50%  respiratory variability, suggesting right atrial pressure of 3 mmHg.   Labs/Other Tests and Data Reviewed:    EKG:  {EKG/Telemetry Strips Reviewed:[phone]}  Recent Labs: 06/27/2019: ALT 15; BUN 10; Creatinine, Ser 0.72; Hemoglobin 12.1; Platelets 327; Potassium 4.2; Sodium 142; TSH 0.536   Recent Lipid Panel Lab Results  Component Value Date/Time   CHOL 212 (H) 06/27/2019 12:09 PM   TRIG 118 06/27/2019 12:09 PM   HDL 93 06/27/2019 12:09 PM   CHOLHDL 2.3 06/27/2019 12:09 PM   LDLCALC 99 06/27/2019 12:09 PM    Wt Readings from Last 3 Encounters:  07/07/19 202 lb (91.6 kg)  06/27/19 222 lb (100.7 kg)  05/06/19 213 lb 3.2 oz (96.7 kg)     Objective:    Vital Signs:  There were no vitals taken for this visit.   {[facility] Virtual Exam (Optional):[phone]::"VITAL SIGNS:  reviewed"}  ASSESSMENT & PLAN:    1. SOB/DOE - will obtain echo to evaluate for any underlying cardiac dysfunction as the etiology - EKG today shows SR, no acute ischemic changes  2. Heart  murmur - obtain echo in setting of murmur, SOB/DOE, and LE edema  COVID-19 Education: The signs and symptoms of COVID-19 were discussed with the patient and how to seek care for testing (follow up with PCP or arrange E-visit).  ***The importance of social distancing was discussed today.  Time:   Today, I have spent *** minutes with the patient with telehealth technology discussing the above problems.     Medication Adjustments/Labs and Tests Ordered: Current medicines are reviewed at length with the patient today.  Concerns regarding medicines are outlined above.   Tests Ordered: No orders of the defined types were placed in this encounter.   Medication Changes: No orders of the defined types were placed in this encounter.   Follow Up:  {F/U [phone]} {follow up:15908}  Signed, Carlyle Dolly, MD  08/27/2019 8:28 AM    [facility]

## 2019-09-02 ENCOUNTER — Other Ambulatory Visit: Payer: Self-pay

## 2019-09-02 ENCOUNTER — Ambulatory Visit (INDEPENDENT_AMBULATORY_CARE_PROVIDER_SITE_OTHER): Payer: Medicare HMO | Admitting: Family Medicine

## 2019-09-02 ENCOUNTER — Encounter: Payer: Self-pay | Admitting: Family Medicine

## 2019-09-02 VITALS — BP 122/72 | HR 98 | Temp 98.2°F | Ht 61.0 in | Wt 215.8 lb

## 2019-09-02 DIAGNOSIS — M810 Age-related osteoporosis without current pathological fracture: Secondary | ICD-10-CM | POA: Diagnosis not present

## 2019-09-02 DIAGNOSIS — R609 Edema, unspecified: Secondary | ICD-10-CM | POA: Diagnosis not present

## 2019-09-02 DIAGNOSIS — M150 Primary generalized (osteo)arthritis: Secondary | ICD-10-CM

## 2019-09-02 DIAGNOSIS — M159 Polyosteoarthritis, unspecified: Secondary | ICD-10-CM | POA: Diagnosis not present

## 2019-09-02 DIAGNOSIS — Z6841 Body Mass Index (BMI) 40.0 and over, adult: Secondary | ICD-10-CM

## 2019-09-02 DIAGNOSIS — K219 Gastro-esophageal reflux disease without esophagitis: Secondary | ICD-10-CM

## 2019-09-02 DIAGNOSIS — R8290 Unspecified abnormal findings in urine: Secondary | ICD-10-CM | POA: Diagnosis not present

## 2019-09-02 DIAGNOSIS — Z23 Encounter for immunization: Secondary | ICD-10-CM

## 2019-09-02 DIAGNOSIS — I10 Essential (primary) hypertension: Secondary | ICD-10-CM | POA: Diagnosis not present

## 2019-09-02 DIAGNOSIS — Z79899 Other long term (current) drug therapy: Secondary | ICD-10-CM | POA: Diagnosis not present

## 2019-09-02 DIAGNOSIS — E538 Deficiency of other specified B group vitamins: Secondary | ICD-10-CM

## 2019-09-02 DIAGNOSIS — M8949 Other hypertrophic osteoarthropathy, multiple sites: Secondary | ICD-10-CM

## 2019-09-02 LAB — URINALYSIS, COMPLETE
Bilirubin, UA: NEGATIVE
Glucose, UA: NEGATIVE
Ketones, UA: NEGATIVE
Leukocytes,UA: NEGATIVE
Nitrite, UA: NEGATIVE
Protein,UA: NEGATIVE
RBC, UA: NEGATIVE
Specific Gravity, UA: 1.015 (ref 1.005–1.030)
Urobilinogen, Ur: 0.2 mg/dL (ref 0.2–1.0)
pH, UA: 5.5 (ref 5.0–7.5)

## 2019-09-02 LAB — MICROSCOPIC EXAMINATION
Epithelial Cells (non renal): >10 /hpf — AB (ref 0–10)
RBC, Urine: NONE SEEN /hpf (ref 0–2)
Renal Epithel, UA: NONE SEEN /hpf

## 2019-09-02 MED ORDER — TRAMADOL HCL 50 MG PO TABS: 100.0000 mg | ORAL_TABLET | Freq: Every day | ORAL | 2 refills | Status: DC | PRN

## 2019-09-02 MED ORDER — ESOMEPRAZOLE MAGNESIUM 40 MG PO CPDR: DELAYED_RELEASE_CAPSULE | ORAL | 1 refills | Status: DC

## 2019-09-02 MED ORDER — POTASSIUM CHLORIDE ER 10 MEQ PO TBCR: 10.0000 meq | EXTENDED_RELEASE_TABLET | Freq: Two times a day (BID) | ORAL | 1 refills | Status: DC

## 2019-09-02 MED ORDER — FUROSEMIDE 20 MG PO TABS: 40.0000 mg | ORAL_TABLET | Freq: Every day | ORAL | 1 refills | Status: DC

## 2019-09-02 MED ORDER — CELECOXIB 200 MG PO CAPS: ORAL_CAPSULE | ORAL | 1 refills | Status: DC

## 2019-09-02 MED ORDER — LISINOPRIL 40 MG PO TABS: 40.0000 mg | ORAL_TABLET | Freq: Every day | ORAL | 1 refills | Status: DC

## 2019-09-02 NOTE — Patient Instructions (Signed)
Please take over the counter Calcium 1,200 mg and Vitamin  D3 800 IU once daily to protect your bones. Doing weightbearing exercise like walking, jogging, dancing, etc. is good for you bones.

## 2019-09-02 NOTE — Progress Notes (Signed)
Assessment & Plan:  1. Edema, unspecified type - Encouraged to follow-up with cardiology as she recently missed her follow-up appointment with them.  Continue daily Lasix.  Encouraged use of compression stockings and elevation of her lower extremities when she is sitting or lying.  Low-salt diet. - furosemide (LASIX) 20 MG tablet; Take 2 tablets (40 mg total) by mouth daily. TAKE 1 TABLET BY MOUTH EVERY DAY WHEN LOTS OF SWELLING  Dispense: 180 tablet; Refill: 1  2. Essential hypertension - Well controlled on current regimen.  - lisinopril (ZESTRIL) 40 MG tablet; Take 1 tablet (40 mg total) by mouth daily.  Dispense: 90 tablet; Refill: 1 - potassium chloride (KLOR-CON) 10 MEQ tablet; Take 1 tablet (10 mEq total) by mouth 2 (two) times daily.  Dispense: 180 tablet; Refill: 1  3. Gastroesophageal reflux disease without esophagitis - Well controlled on current regimen.  - esomeprazole (NEXIUM) 40 MG capsule; TAKE (1) CAPSULE DAILY.  Dispense: 90 capsule; Refill: 1  4. Primary osteoarthritis involving multiple joints - Well controlled on current regimen.  - traMADol (ULTRAM) 50 MG tablet; Take 2 tablets (100 mg total) by mouth daily as needed.  Dispense: 60 tablet; Refill: 2 - celecoxib (CELEBREX) 200 MG capsule; TAKE (1) CAPSULE DAILY.  Dispense: 90 capsule; Refill: 1 - Compliance Drug Analysis, Ur  5. Controlled substance agreement signed - Signed for tramadol. UDS today. - Compliance Drug Analysis, Ur  6. Abnormal urine odor - Urinalysis, Complete  7. Age-related osteoporosis without current pathological fracture - Encouraged to make sure she is taking enough calcium and vitamin D with her multivitamin, if not she needs to add these in.  Advised that she should be taking at least 1200 mg of calcium and at least 800 mg of vitamin D. - DG [facility] DEXA; Future  8. Vitamin B12 deficiency - Monthly B12 injection given today in office.  9. Morbid obesity with BMI of 40.0-44.9, adult  ([place]) - Encouraged diet and exercise.  10.  Immunization due - Shingrix given in office today.    Return in about 3 months (around 12/02/2019) for Pain.  Hendricks Limes, MSN, APRN, FNP-C [facility]  Subjective:    Patient ID: Melissa Zavala, female    DOB: 1940-06-29, 79 y.o.   MRN: [id number]  Patient Care Team: Loman Brooklyn, FNP as PCP - General (Family Medicine) Harl Bowie Alphonse Guild, MD as PCP - Cardiology (Cardiology) Ilean China, RN as Case Manager Vela Prose, MD as Surgeon (Student)   Chief Complaint:  Chief Complaint  Patient presents with  . Establish Care    jones pt  . Hypertension    check up of chronic medical conditions    HPI: Melissa Zavala is a 79 y.o. female presenting on 09/02/2019 for Establish Care (jones pt) and Hypertension (check up of chronic medical conditions)  Patient takes Tramadol 100 mg every morning for arthritis pains.   She is not taking calcium or vitamin D besides what is in her multivitamin.   She missed her follow-up with cardiology regarding her shortness of breath and edema.    Social history:  Relevant past medical, surgical, family and social history reviewed and updated as indicated. Interim medical history since our last visit reviewed.  Allergies and medications reviewed and updated.  DATA REVIEWED: CHART IN EPIC  ROS: Negative unless specifically indicated above in HPI.    Current Outpatient Medications:  .  celecoxib (CELEBREX) 200 MG capsule, TAKE (1) CAPSULE DAILY., Disp: 90  capsule, Rfl: 1 .  esomeprazole (NEXIUM) 40 MG capsule, TAKE (1) CAPSULE DAILY., Disp: 90 capsule, Rfl: 1 .  furosemide (LASIX) 20 MG tablet, Take 2 tablets (40 mg total) by mouth daily. TAKE 1 TABLET BY MOUTH EVERY DAY WHEN LOTS OF SWELLING, Disp: 180 tablet, Rfl: 1 .  lisinopril (ZESTRIL) 40 MG tablet, Take 1 tablet (40 mg total) by mouth daily., Disp: 90 tablet, Rfl: 1 .  potassium chloride (KLOR-CON) 10  MEQ tablet, Take 1 tablet (10 mEq total) by mouth 2 (two) times daily., Disp: 180 tablet, Rfl: 1 .  traMADol (ULTRAM) 50 MG tablet, Take 2 tablets (100 mg total) by mouth daily as needed., Disp: 60 tablet, Rfl: 2  Current Facility-Administered Medications:  .  cyanocobalamin ((VITAMIN B-12)) injection 1,000 mcg, 1,000 mcg, Intramuscular, Q30 days, Particia Nearing S, PA-C, 1,000 mcg at 09/02/19 1519   Allergies  Allergen Reactions  . Iodinated Diagnostic Agents Anaphylaxis  . Codeine Other (See Comments)    Hallucinations  . Diphenhydramine Other (See Comments)    Patient states "feels like somebody is inside of me clawing me out"   Past Medical History:  Diagnosis Date  . Arthritis   . Hypertension     Past Surgical History:  Procedure Laterality Date  . TOTAL HIP ARTHROPLASTY Right   . TOTAL KNEE ARTHROPLASTY Bilateral   . TUBAL LIGATION      Social History   Socioeconomic History  . Marital status: Legally Separated    Spouse name: Not on file  . Number of children: 7  . Years of education: Not on file  . Highest education level: 8th grade  Occupational History  . Not on file  Tobacco Use  . Smoking status: Never Smoker  . Smokeless tobacco: Never Used  Substance and Sexual Activity  . Alcohol use: No  . Drug use: No  . Sexual activity: Not Currently  Other Topics Concern  . Not on file  Social History Narrative  . Not on file   Social Determinants of Health   Financial Resource Strain: Low Risk   . Difficulty of Paying Living Expenses: Not hard at all  Food Insecurity: No Food Insecurity  . Worried About Charity fundraiser in the Last Year: Never true  . Ran Out of Food in the Last Year: Never true  Transportation Needs: No Transportation Needs  . Lack of Transportation (Medical): No  . Lack of Transportation (Non-Medical): No  Physical Activity: Inactive  . Days of Exercise per Week: 0 days  . Minutes of Exercise per Session: 0 min  Stress: No Stress  Concern Present  . Feeling of Stress : Not at all  Social Connections: Slightly Isolated  . Frequency of Communication with Friends and Family: More than three times a week  . Frequency of Social Gatherings with Friends and Family: Once a week  . Attends Religious Services: More than 4 times per year  . Active Member of Clubs or Organizations: Yes  . Attends Archivist Meetings: More than 4 times per year  . Marital Status: Separated  Intimate Partner Violence: Not At Risk  . Fear of Current or Ex-Partner: No  . Emotionally Abused: No  . Physically Abused: No  . Sexually Abused: No        Objective:    BP 122/72   Pulse 98   Temp 98.2 F (36.8 C) (Temporal)   Ht 5\' 1"  (1.549 m)   Wt 215 lb 12.8 oz (97.9 kg)  SpO2 94%   BMI 40.78 kg/m   Wt Readings from Last 3 Encounters:  09/02/19 215 lb 12.8 oz (97.9 kg)  07/07/19 202 lb (91.6 kg)  06/27/19 222 lb (100.7 kg)    Physical Exam Vitals reviewed.  Constitutional:      General: She is not in acute distress.    Appearance: Normal appearance. She is not ill-appearing, toxic-appearing or diaphoretic.  HENT:     Head: Normocephalic and atraumatic.  Eyes:     General: No scleral icterus.       Right eye: No discharge.        Left eye: No discharge.     Conjunctiva/sclera: Conjunctivae normal.  Cardiovascular:     Rate and Rhythm: Normal rate and regular rhythm.     Heart sounds: Normal heart sounds. No murmur. No friction rub. No gallop.   Pulmonary:     Effort: Pulmonary effort is normal. No respiratory distress.     Breath sounds: Normal breath sounds. No stridor. No wheezing, rhonchi or rales.  Musculoskeletal:        General: Normal range of motion.     Cervical back: Normal range of motion.     Right lower leg: Edema present.     Left lower leg: Edema present.  Skin:    General: Skin is warm and dry.     Capillary Refill: Capillary refill takes less than 2 seconds.  Neurological:     General: No  focal deficit present.     Mental Status: She is alert and oriented to person, place, and time. Mental status is at baseline.  Psychiatric:        Mood and Affect: Mood normal.        Behavior: Behavior normal.        Thought Content: Thought content normal.        Judgment: Judgment normal.     Lab Results  Component Value Date   TSH 0.536 06/27/2019   Lab Results  Component Value Date   WBC 8.4 06/27/2019   HGB 12.1 06/27/2019   HCT 37.1 06/27/2019   MCV 83 06/27/2019   PLT 327 06/27/2019   Lab Results  Component Value Date   NA 142 06/27/2019   K 4.2 06/27/2019   CO2 23 06/27/2019   GLUCOSE 95 06/27/2019   BUN 10 06/27/2019   CREATININE 0.72 06/27/2019   BILITOT <0.2 06/27/2019   ALKPHOS 74 06/27/2019   AST 18 06/27/2019   ALT 15 06/27/2019   PROT 6.5 06/27/2019   ALBUMIN 4.2 06/27/2019   CALCIUM 9.9 06/27/2019   ANIONGAP 8 07/18/2016   Lab Results  Component Value Date   CHOL 212 (H) 06/27/2019   Lab Results  Component Value Date   HDL 93 06/27/2019   Lab Results  Component Value Date   LDLCALC 99 06/27/2019   Lab Results  Component Value Date   TRIG 118 06/27/2019   Lab Results  Component Value Date   CHOLHDL 2.3 06/27/2019   Lab Results  Component Value Date   HGBA1C 5.8 06/27/2019

## 2019-09-03 ENCOUNTER — Encounter: Payer: Medicare HMO | Admitting: *Deleted

## 2019-09-05 LAB — COMPLIANCE DRUG ANALYSIS, UR

## 2019-09-08 ENCOUNTER — Ambulatory Visit: Payer: Medicare HMO | Admitting: Family

## 2019-09-10 ENCOUNTER — Ambulatory Visit (INDEPENDENT_AMBULATORY_CARE_PROVIDER_SITE_OTHER): Payer: Medicare HMO | Admitting: *Deleted

## 2019-09-10 DIAGNOSIS — M8949 Other hypertrophic osteoarthropathy, multiple sites: Secondary | ICD-10-CM | POA: Diagnosis not present

## 2019-09-10 DIAGNOSIS — I10 Essential (primary) hypertension: Secondary | ICD-10-CM

## 2019-09-10 DIAGNOSIS — R609 Edema, unspecified: Secondary | ICD-10-CM

## 2019-09-10 NOTE — Patient Instructions (Addendum)
Visit Information  Goals Addressed            This Visit's Progress     Patient Stated   . "I want to keep the swelling in my legs under control" (pt-stated)        CARE PLAN ENTRY (see longitudinal plan of care for additional care plan information)  Current Barriers:  . Care Coordination needs related to lower extremity edema in a patient with hypertension (disease states)  Nurse Case Manager Clinical Goal(s):  [name] Over the next 30 days, patient will follow-up with cardiologist regarding her lower extremity edema  Interventions:  . Inter-disciplinary care team collaboration (see longitudinal plan of care) . Chart reviewed including cardiology notes and echo results . Talked with patient by telephone o Continues to have some LE edema o SOB has improved o Discussed echo results  o Advised that she missed her cardiac follow-up appt on 4/14 . Reviewed and discussed medications: Lasix . Recommended that she reschedule cardiac visit o Patient asked if I could coordinate an appointment for the end of May because she is out of town at her son's house in [place] until then . Reached out to cardiology and scheduled follow up: virtual visit with Dr Harl Bowie 5/26 at 10:20 . Record daily weights and report any increase of 3 lbs or more in 24 hours or 5 lbs over a few days . Encouraged patient to reach out to cardiologist with any new or worsening symptoms . Provided with CCM contact info and encouraged to reach out as needed  Patient Self Care Activities:  . Performs ADL's independently . Performs IADL's independently   Initial goal documentation        The patient verbalized understanding of instructions provided today and declined a print copy of patient instruction materials.   Follow-up Plan The care management team will reach out to the patient again over the next 30 days.   [name], BSN, RN-BC Embedded Chronic Care Manager [facility] / [place] Management Direct Dial: [phone]

## 2019-09-10 NOTE — Chronic Care Management (AMB) (Signed)
  Chronic Care Management   Follow Up Note   09/10/2019 Name: Melissa Zavala MRN: [id number] DOB: Nov 30, 1940  Referred by: Loman Brooklyn, FNP Reason for referral : Chronic Care Management (RN Follow up)   Melissa Zavala is a 79 y.o. year old female who is a primary care patient of Loman Brooklyn, FNP. The CCM team was consulted for assistance with chronic disease management and care coordination needs.    Review of patient status, including review of consultants reports, relevant laboratory and other test results, and collaboration with appropriate care team members and the patient's provider was performed as part of comprehensive patient evaluation and provision of chronic care management services.    SDOH (Social Determinants of Health) assessments performed: No See Care Plan activities for detailed interventions related to [facility])     Outpatient Encounter Medications as of 09/10/2019  Medication Sig  . celecoxib (CELEBREX) 200 MG capsule TAKE (1) CAPSULE DAILY.  [name] esomeprazole (NEXIUM) 40 MG capsule TAKE (1) CAPSULE DAILY.  . furosemide (LASIX) 20 MG tablet Take 2 tablets (40 mg total) by mouth daily. TAKE 1 TABLET BY MOUTH EVERY DAY WHEN LOTS OF SWELLING  . lisinopril (ZESTRIL) 40 MG tablet Take 1 tablet (40 mg total) by mouth daily.  . potassium chloride (KLOR-CON) 10 MEQ tablet Take 1 tablet (10 mEq total) by mouth 2 (two) times daily.  . traMADol (ULTRAM) 50 MG tablet Take 2 tablets (100 mg total) by mouth daily as needed.   Facility-Administered Encounter Medications as of 09/10/2019  Medication  . cyanocobalamin ((VITAMIN B-12)) injection 1,000 mcg     RN Care Plan   . "I want to keep the swelling in my legs under control" (pt-stated)        CARE PLAN ENTRY (see longitudinal plan of care for additional care plan information)  Current Barriers:  . Care Coordination needs related to lower extremity edema in a patient with hypertension (disease states)  Nurse Case  Manager Clinical Goal(s):  [name] Over the next 30 days, patient will follow-up with cardiologist regarding her lower extremity edema  Interventions:  . Inter-disciplinary care team collaboration (see longitudinal plan of care) . Chart reviewed including cardiology notes and echo results . Talked with patient by telephone o Continues to have some LE edema o SOB has improved o Discussed echo results  o Advised that she missed her cardiac follow-up appt on 4/14 . Reviewed and discussed medications: Lasix . Recommended that she reschedule cardiac visit o Patient asked if I could coordinate an appointment for the end of May because she is out of town at her son's house in [place] until then . Reached out to cardiology and scheduled follow up: virtual visit with Dr Harl Bowie 5/26 at 10:20 . Record daily weights and report any increase of 3 lbs or more in 24 hours or 5 lbs over a few days . Encouraged patient to reach out to cardiologist with any new or worsening symptoms . Provided with CCM contact info and encouraged to reach out as needed  Patient Self Care Activities:  . Performs ADL's independently . Performs IADL's independently   Initial goal documentation         Plan:   The care management team will reach out to the patient again over the next 30 days.   [name], BSN, RN-BC Embedded Chronic Care Manager [facility] / [place] Management Direct Dial: [phone]

## 2019-10-08 ENCOUNTER — Telehealth: Payer: Medicare HMO | Admitting: Cardiology

## 2019-10-08 NOTE — Progress Notes (Unsigned)
{Choose 1 Note Type (Video or Telephone):[phone]}   The patient was identified using 2 identifiers.  Date:  10/08/2019   ID:  Melissa Zavala, DOB Nov 14, 1940, MRN [id number]  {Patient [phone]::"Home"} {Provider Location:[phone]::"Home"}  PCP:  Loman Brooklyn, FNP  Cardiologist:  Carlyle Dolly, MD *** Electrophysiologist:  None   Evaluation Performed:  {Choose Visit Type:[phone]::"Follow-Up Visit"}  Chief Complaint:  ***  History of Present Illness:    Melissa Zavala is a 79 y.o. female with    1. DOE/LE edema - ongoing for last few months - DOE just walking in to the house from her car which is new for her - bilateral LE edema - no orthopnea - no significant chest pain - no recent coughing or wheezing.  - takes lasix prn which controls the swelling. Typically takes every other day, will take daily if needed   07/2019 echo LVEF 65-70%, grade I DDx, normal RV, severe LAE   The patient {does/does not:200015} have symptoms concerning for COVID-19 infection (fever, chills, cough, or new shortness of breath).    Past Medical History:  Diagnosis Date  . Arthritis   . Hypertension    Past Surgical History:  Procedure Laterality Date  . TOTAL HIP ARTHROPLASTY Right   . TOTAL KNEE ARTHROPLASTY Bilateral   . TUBAL LIGATION       No outpatient medications have been marked as taking for the 10/08/19 encounter (Appointment) with Arnoldo Lenis, MD.   Current Facility-Administered Medications for the 10/08/19 encounter (Appointment) with Arnoldo Lenis, MD  Medication  . cyanocobalamin ((VITAMIN B-12)) injection 1,000 mcg     Allergies:   Iodinated diagnostic agents, Codeine, and Diphenhydramine   Social History   Tobacco Use  . Smoking status: Never Smoker  . Smokeless tobacco: Never Used  Substance Use Topics  . Alcohol use: No  . Drug use: No     Family Hx: The patient's family history includes Breast cancer in her  mother.  ROS:   Please see the history of present illness.    *** All other systems reviewed and are negative.   Prior CV studies:   The following studies were reviewed today:  07/2019 echo IMPRESSIONS    1. Left ventricular ejection fraction, by estimation, is 65 to 70%. The  left ventricle has normal function. The left ventricle has no regional  wall motion abnormalities. Left ventricular diastolic parameters are  consistent with Grade I diastolic  dysfunction (impaired relaxation).  2. Right ventricular systolic function is normal. The right ventricular  size is normal. There is mildly elevated pulmonary artery systolic  pressure.  3. Left atrial size was severely dilated.  4. The mitral valve is degenerative. Mild mitral valve regurgitation.  5. The aortic valve is tricuspid. Aortic valve regurgitation is not  visualized. No aortic stenosis is present.  6. The inferior vena cava is normal in size with greater than 50%  respiratory variability, suggesting right atrial pressure of 3 mmHg.   Labs/Other Tests and Data Reviewed:    EKG:  {EKG/Telemetry Strips Reviewed:[phone]}  Recent Labs: 06/27/2019: ALT 15; BUN 10; Creatinine, Ser 0.72; Hemoglobin 12.1; Platelets 327; Potassium 4.2; Sodium 142; TSH 0.536   Recent Lipid Panel Lab Results  Component Value Date/Time   CHOL 212 (H) 06/27/2019 12:09 PM   TRIG 118 06/27/2019 12:09 PM   HDL 93 06/27/2019 12:09 PM   CHOLHDL 2.3 06/27/2019 12:09 PM   LDLCALC 99 06/27/2019 12:09 PM    Wt Readings from Last  3 Encounters:  09/02/19 215 lb 12.8 oz (97.9 kg)  07/07/19 202 lb (91.6 kg)  06/27/19 222 lb (100.7 kg)     Objective:    Vital Signs:  There were no vitals taken for this visit.   {[facility] Virtual Exam (Optional):[phone]::"VITAL SIGNS:  reviewed"}  ASSESSMENT & PLAN:    1. SOB/DOE - will obtain echo to evaluate for any underlying cardiac dysfunction as the etiology - EKG today shows SR, no acute  ischemic changes  2. Heart murmur - obtain echo in setting of murmur, SOB/DOE, and LE edema  COVID-19 Education: The signs and symptoms of COVID-19 were discussed with the patient and how to seek care for testing (follow up with PCP or arrange E-visit).  ***The importance of social distancing was discussed today.  Time:   Today, I have spent *** minutes with the patient with telehealth technology discussing the above problems.     Medication Adjustments/Labs and Tests Ordered: Current medicines are reviewed at length with the patient today.  Concerns regarding medicines are outlined above.   Tests Ordered: No orders of the defined types were placed in this encounter.   Medication Changes: No orders of the defined types were placed in this encounter.   Follow Up:  {F/U [phone]} {follow up:15908}  Signed, Carlyle Dolly, MD  10/08/2019 8:00 AM    [facility]

## 2019-10-21 ENCOUNTER — Ambulatory Visit: Payer: Medicare HMO | Admitting: *Deleted

## 2019-10-21 DIAGNOSIS — I10 Essential (primary) hypertension: Secondary | ICD-10-CM

## 2019-10-22 NOTE — Chronic Care Management (AMB) (Addendum)
  Chronic Care Management   Follow-up Note  10/21/2019 Name: Melissa Zavala MRN: [id number] DOB: 12-31-1940  Referred by: Loman Brooklyn, FNP Reason for referral : Chronic Care Management (RN follow up)   An unsuccessful telephone follow-up was attempted today. I spoke with patient briefly but she was unavailable to have a full visit at that time and said that she would call me back. The patient was referred to the case management team for assistance with care management and care coordination.   Follow Up Plan: The care management team will reach out to the patient again over the next 30 days.   [name], BSN, RN-BC Embedded Chronic Care Manager [facility] / [place] Management Direct Dial: [phone]

## 2019-10-24 ENCOUNTER — Telehealth: Payer: Medicare HMO | Admitting: Cardiology

## 2019-10-24 NOTE — Progress Notes (Unsigned)
{Choose 1 Note Type (Video or Telephone):[phone]}   The patient was identified using 2 identifiers.  Date:  10/24/2019   ID:  Melissa Zavala, DOB 03/24/1941, MRN [id number]  {Patient Location:[phone]::"Home"} {Provider Location:[phone]::"Home"}  PCP:  Loman Brooklyn, FNP  Cardiologist:  Carlyle Dolly, MD *** Electrophysiologist:  None   Evaluation Performed:  {Choose Visit Type:[phone]::"Follow-Up Visit"}  Chief Complaint:  ***  History of Present Illness:    Melissa Zavala is a 79 y.o. female with ***   1. DOE/LE edema - ongoing forlast few months - DOE just walking into the housefrom her car which is new for her - bilateral LE edema - no orthopnea - no significant chest pain - no recent coughing or wheezing.  - takes lasix prn which controls the swelling. Typically takes every other day, will take daily if needed   07/2019 echo LVEF 65-70%, grade I DDx, normal RV, severe LAE The patient {does/does not:200015} have symptoms concerning for COVID-19 infection (fever, chills, cough, or new shortness of breath).    Past Medical History:  Diagnosis Date  . Arthritis   . Hypertension    Past Surgical History:  Procedure Laterality Date  . TOTAL HIP ARTHROPLASTY Right   . TOTAL KNEE ARTHROPLASTY Bilateral   . TUBAL LIGATION       No outpatient medications have been marked as taking for the 10/24/19 encounter (Appointment) with Arnoldo Lenis, MD.   Current Facility-Administered Medications for the 10/24/19 encounter (Appointment) with Arnoldo Lenis, MD  Medication  . cyanocobalamin ((VITAMIN B-12)) injection 1,000 mcg     Allergies:   Iodinated diagnostic agents, Codeine, and Diphenhydramine   Social History   Tobacco Use  . Smoking status: Never Smoker  . Smokeless tobacco: Never Used  Vaping Use  . Vaping Use: Never used  Substance Use Topics  . Alcohol use: No  . Drug use: No     Family Hx: The patient's family history  includes Breast cancer in her mother.  ROS:   Please see the history of present illness.    *** All other systems reviewed and are negative.   Prior CV studies:   The following studies were reviewed today:  07/2019 echo IMPRESSIONS    1. Left ventricular ejection fraction, by estimation, is 65 to 70%. The  left ventricle has normal function. The left ventricle has no regional  wall motion abnormalities. Left ventricular diastolic parameters are  consistent with Grade I diastolic  dysfunction (impaired relaxation).  2. Right ventricular systolic function is normal. The right ventricular  size is normal. There is mildly elevated pulmonary artery systolic  pressure.  3. Left atrial size was severely dilated.  4. The mitral valve is degenerative. Mild mitral valve regurgitation.  5. The aortic valve is tricuspid. Aortic valve regurgitation is not  visualized. No aortic stenosis is present.  6. The inferior vena cava is normal in size with greater than 50%  respiratory variability, suggesting right atrial pressure of 3 mmHg.   Labs/Other Tests and Data Reviewed:    EKG:  {EKG/Telemetry Strips Reviewed:[phone]}  Recent Labs: 06/27/2019: ALT 15; BUN 10; Creatinine, Ser 0.72; Hemoglobin 12.1; Platelets 327; Potassium 4.2; Sodium 142; TSH 0.536   Recent Lipid Panel Lab Results  Component Value Date/Time   CHOL 212 (H) 06/27/2019 12:09 PM   TRIG 118 06/27/2019 12:09 PM   HDL 93 06/27/2019 12:09 PM   CHOLHDL 2.3 06/27/2019 12:09 PM   LDLCALC 99 06/27/2019 12:09 PM  Wt Readings from Last 3 Encounters:  09/02/19 215 lb 12.8 oz (97.9 kg)  07/07/19 202 lb (91.6 kg)  06/27/19 222 lb (100.7 kg)     Objective:    Vital Signs:  There were no vitals taken for this visit.   {[facility] Virtual Exam (Optional):[phone]::"VITAL SIGNS:  reviewed"}  ASSESSMENT & PLAN:    1. SOB/DOE - will obtain echo to evaluate for any underlying cardiac dysfunction as the etiology -  EKG today shows SR, no acute ischemic changes  2. Heart murmur - obtain echo in setting of murmur, SOB/DOE, and LE edema  COVID-19 Education: The signs and symptoms of COVID-19 were discussed with the patient and how to seek care for testing (follow up with PCP or arrange E-visit).  ***The importance of social distancing was discussed today.  Time:   Today, I have spent *** minutes with the patient with telehealth technology discussing the above problems.     Medication Adjustments/Labs and Tests Ordered: Current medicines are reviewed at length with the patient today.  Concerns regarding medicines are outlined above.   Tests Ordered: No orders of the defined types were placed in this encounter.   Medication Changes: No orders of the defined types were placed in this encounter.   Follow Up:  {F/U Format:[phone]} {follow up:15908}  Signed, Carlyle Dolly, MD  10/24/2019 8:08 AM    [facility]

## 2019-11-05 DIAGNOSIS — M62838 Other muscle spasm: Secondary | ICD-10-CM | POA: Diagnosis not present

## 2019-11-05 DIAGNOSIS — M542 Cervicalgia: Secondary | ICD-10-CM | POA: Diagnosis not present

## 2019-11-12 ENCOUNTER — Encounter: Payer: Self-pay | Admitting: Family Medicine

## 2019-11-12 ENCOUNTER — Ambulatory Visit (INDEPENDENT_AMBULATORY_CARE_PROVIDER_SITE_OTHER): Payer: Medicare HMO | Admitting: Family Medicine

## 2019-11-12 DIAGNOSIS — B9689 Other specified bacterial agents as the cause of diseases classified elsewhere: Secondary | ICD-10-CM

## 2019-11-12 DIAGNOSIS — J988 Other specified respiratory disorders: Secondary | ICD-10-CM

## 2019-11-12 MED ORDER — METHYLPREDNISOLONE 4 MG PO TBPK: ORAL_TABLET | ORAL | 0 refills | Status: DC

## 2019-11-12 MED ORDER — AMOXICILLIN-POT CLAVULANATE 875-125 MG PO TABS: 1.0000 | ORAL_TABLET | Freq: Two times a day (BID) | ORAL | 0 refills | Status: AC

## 2019-11-12 NOTE — Progress Notes (Signed)
Virtual Visit via Telephone Note  I connected with Melissa Zavala on 11/12/19 at 5:29 PM by telephone and verified that I am speaking with the correct person using two identifiers. Melissa Zavala is currently located in her car and her son is currently with her during this visit. The provider, Loman Brooklyn, FNP is located in their office at time of visit.  I discussed the limitations, risks, security and privacy concerns of performing an evaluation and management service by telephone and the availability of in person appointments. I also discussed with the patient that there may be a patient responsible charge related to this service. The patient expressed understanding and agreed to proceed.  Subjective: PCP: Loman Brooklyn, FNP  Chief Complaint  Patient presents with  . URI   Patient complains of cough, chest congestion, headache, runny nose, sore throat and wheezing.  Onset of symptoms was 4 days ago, gradually worsening since that time. She is drinking plenty of fluids. Evaluation to date: none. Treatment to date: none.  She does not smoke.    ROS: Per HPI  Current Outpatient Medications:  .  celecoxib (CELEBREX) 200 MG capsule, TAKE (1) CAPSULE DAILY., Disp: 90 capsule, Rfl: 1 .  esomeprazole (NEXIUM) 40 MG capsule, TAKE (1) CAPSULE DAILY., Disp: 90 capsule, Rfl: 1 .  furosemide (LASIX) 20 MG tablet, Take 2 tablets (40 mg total) by mouth daily. TAKE 1 TABLET BY MOUTH EVERY DAY WHEN LOTS OF SWELLING, Disp: 180 tablet, Rfl: 1 .  lisinopril (ZESTRIL) 40 MG tablet, Take 1 tablet (40 mg total) by mouth daily., Disp: 90 tablet, Rfl: 1 .  potassium chloride (KLOR-CON) 10 MEQ tablet, Take 1 tablet (10 mEq total) by mouth 2 (two) times daily., Disp: 180 tablet, Rfl: 1 .  traMADol (ULTRAM) 50 MG tablet, Take 2 tablets (100 mg total) by mouth daily as needed., Disp: 60 tablet, Rfl: 2  Current Facility-Administered Medications:  .  cyanocobalamin ((VITAMIN B-12)) injection 1,000 mcg,  1,000 mcg, Intramuscular, Q30 days, Particia Nearing S, PA-C, 1,000 mcg at 09/02/19 1519  Allergies  Allergen Reactions  . Iodinated Diagnostic Agents Anaphylaxis  . Codeine Other (See Comments)    Hallucinations  . Diphenhydramine Other (See Comments)    Patient states "feels like somebody is inside of me clawing me out"   Past Medical History:  Diagnosis Date  . Arthritis   . Hypertension     Observations/Objective: A&O  No respiratory distress or wheezing audible over the phone Mood, judgement, and thought processes all WNL  Assessment and Plan: 1. Bacterial respiratory infection - amoxicillin-clavulanate (AUGMENTIN) 875-125 MG tablet; Take 1 tablet by mouth 2 (two) times daily for 7 days.  Dispense: 14 tablet; Refill: 0 - methylPREDNISolone (MEDROL DOSEPAK) 4 MG TBPK tablet; Use as directed on package.  Dispense: 21 each; Refill: 0   Follow Up Instructions:  I discussed the assessment and treatment plan with the patient. The patient was provided an opportunity to ask questions and all were answered. The patient agreed with the plan and demonstrated an understanding of the instructions.   The patient was advised to call back or seek an in-person evaluation if the symptoms worsen or if the condition fails to improve as anticipated.  The above assessment and management plan was discussed with the patient. The patient verbalized understanding of and has agreed to the management plan. Patient is aware to call the clinic if symptoms persist or worsen. Patient is aware when to return to the clinic  for a follow-up visit. Patient educated on when it is appropriate to go to the emergency department.   Time call ended: 5:39 PM  I provided 12 minutes of non-face-to-face time during this encounter.  Hendricks Limes, MSN, APRN, FNP-C [facility] 11/12/19

## 2019-11-23 DIAGNOSIS — R110 Nausea: Secondary | ICD-10-CM | POA: Diagnosis not present

## 2019-11-23 DIAGNOSIS — R531 Weakness: Secondary | ICD-10-CM | POA: Diagnosis not present

## 2019-11-23 DIAGNOSIS — Z87891 Personal history of nicotine dependence: Secondary | ICD-10-CM | POA: Diagnosis not present

## 2019-11-23 DIAGNOSIS — R1111 Vomiting without nausea: Secondary | ICD-10-CM | POA: Diagnosis not present

## 2019-11-23 DIAGNOSIS — R252 Cramp and spasm: Secondary | ICD-10-CM | POA: Diagnosis not present

## 2019-11-23 DIAGNOSIS — I10 Essential (primary) hypertension: Secondary | ICD-10-CM | POA: Diagnosis not present

## 2019-11-23 DIAGNOSIS — R52 Pain, unspecified: Secondary | ICD-10-CM | POA: Diagnosis not present

## 2019-11-23 DIAGNOSIS — R197 Diarrhea, unspecified: Secondary | ICD-10-CM | POA: Diagnosis not present

## 2019-12-02 ENCOUNTER — Other Ambulatory Visit: Payer: Self-pay

## 2019-12-02 ENCOUNTER — Other Ambulatory Visit: Payer: Medicare HMO

## 2019-12-02 ENCOUNTER — Ambulatory Visit (INDEPENDENT_AMBULATORY_CARE_PROVIDER_SITE_OTHER): Payer: Medicare HMO | Admitting: Family Medicine

## 2019-12-02 ENCOUNTER — Encounter: Payer: Self-pay | Admitting: Family Medicine

## 2019-12-02 VITALS — BP 150/80 | HR 87 | Temp 97.5°F | Ht 61.0 in | Wt 212.2 lb

## 2019-12-02 DIAGNOSIS — R195 Other fecal abnormalities: Secondary | ICD-10-CM | POA: Diagnosis not present

## 2019-12-02 DIAGNOSIS — M8949 Other hypertrophic osteoarthropathy, multiple sites: Secondary | ICD-10-CM

## 2019-12-02 DIAGNOSIS — M159 Polyosteoarthritis, unspecified: Secondary | ICD-10-CM

## 2019-12-02 MED ORDER — TRAMADOL HCL 50 MG PO TABS: 100.0000 mg | ORAL_TABLET | Freq: Every day | ORAL | 2 refills | Status: DC | PRN

## 2019-12-02 MED ORDER — TRIAMCINOLONE ACETONIDE 0.1 % EX CREA
1.0000 | TOPICAL_CREAM | Freq: Two times a day (BID) | CUTANEOUS | 0 refills | Status: DC
Start: 2019-12-02 — End: 2020-04-02

## 2019-12-02 NOTE — Patient Instructions (Signed)

## 2019-12-02 NOTE — Progress Notes (Signed)
Assessment & Plan:  1. Primary osteoarthritis involving multiple joints - Well controlled on current regimen.  - traMADol (ULTRAM) 50 MG tablet; Take 2 tablets (100 mg total) by mouth daily as needed.  Dispense: 60 tablet; Refill: 2  2. Loose stools - Education provided on high-fiber foods to help bulk her stools up.   Return in about 3 months (around 03/03/2020) for follow-up of chronic medication conditions.  Hendricks Limes, MSN, APRN, FNP-C [facility]  Subjective:    Patient ID: Melissa Zavala, female    DOB: 1940/06/06, 79 y.o.   MRN: [id number]  Patient Care Team: Loman Brooklyn, FNP as PCP - General (Family Medicine) Harl Bowie Alphonse Guild, MD as PCP - Cardiology (Cardiology) Ilean China, RN as Case Manager Vela Prose, MD as Surgeon (Student)   Chief Complaint:  Chief Complaint  Patient presents with  . Pain    3 month follow up of chronic medical conditions  . Diarrhea    7/11[facility]. Patient states she still has some diarrhea when eating.    HPI: Melissa Zavala is a 79 y.o. female presenting on 12/02/2019 for Pain (3 month follow up of chronic medical conditions) and Diarrhea (7/11[facility]. Patient states she still has some diarrhea when eating.)  Pain assessment: Cause of pain- arthritis Pain location- legs & back Pain on scale of 1-10- 7-8/10 without medication; 6/10 with medication Frequency- comes and goes What increases pain- prolonged standing What makes pain better- rest and medication Effects on ADL- none Any change in general medical condition- none  Current opioids rx- Tramadol 100 mg daily as needed # meds rx- 60 Effectiveness of current meds- effective Adverse reactions form pain meds- none Morphine equivalent- 10 MME/day  Pill count performed-No Last drug screen - 09/02/19 ( high risk q80m, moderate risk q68m, low risk yearly ) Urine drug screen today- No Was the [place] reviewed- Yes  If yes  were their any concerning findings? - No  Overdose risk: 120  Opioid Risk  12/02/2019  Alcohol 0  Illegal Drugs 3  Rx Drugs 4  Alcohol 0  Illegal Drugs 0  Rx Drugs 0  Age between 16-45 years  0  History of Preadolescent Sexual Abuse 3  Psychological Disease 0  Depression 0  Opioid Risk Tool Scoring 10  Opioid Risk Interpretation High Risk   Pain contract signed on: 09/02/2019  New complaints: Patient reports she was having diarrhea so bad when she went to the ER that she literally thought she was going to die.  She reports now she only has a little bit when she eats, but that is much better.  Feels it is possibly related to antibiotic use.  Social history:  Relevant past medical, surgical, family and social history reviewed and updated as indicated. Interim medical history since our last visit reviewed.  Allergies and medications reviewed and updated.  DATA REVIEWED: CHART IN EPIC  ROS: Negative unless specifically indicated above in HPI.    Current Outpatient Medications:  .  celecoxib (CELEBREX) 200 MG capsule, TAKE (1) CAPSULE DAILY., Disp: 90 capsule, Rfl: 1 .  esomeprazole (NEXIUM) 40 MG capsule, TAKE (1) CAPSULE DAILY., Disp: 90 capsule, Rfl: 1 .  furosemide (LASIX) 20 MG tablet, Take 2 tablets (40 mg total) by mouth daily. TAKE 1 TABLET BY MOUTH EVERY DAY WHEN LOTS OF SWELLING, Disp: 180 tablet, Rfl: 1 .  lisinopril (ZESTRIL) 40 MG tablet, Take 1 tablet (40 mg total) by mouth daily.,  Disp: 90 tablet, Rfl: 1 .  methylPREDNISolone (MEDROL DOSEPAK) 4 MG TBPK tablet, Use as directed on package., Disp: 21 each, Rfl: 0 .  potassium chloride (KLOR-CON) 10 MEQ tablet, Take 1 tablet (10 mEq total) by mouth 2 (two) times daily., Disp: 180 tablet, Rfl: 1 .  traMADol (ULTRAM) 50 MG tablet, Take 2 tablets (100 mg total) by mouth daily as needed., Disp: 60 tablet, Rfl: 2  Current Facility-Administered Medications:  .  cyanocobalamin ((VITAMIN B-12)) injection 1,000 mcg, 1,000 mcg,  Intramuscular, Q30 days, Particia Nearing S, PA-C, 1,000 mcg at 09/02/19 1519   Allergies  Allergen Reactions  . Iodinated Diagnostic Agents Anaphylaxis  . Codeine Other (See Comments)    Hallucinations  . Diphenhydramine Other (See Comments)    Patient states "feels like somebody is inside of me clawing me out"   Past Medical History:  Diagnosis Date  . Arthritis   . Hypertension     Past Surgical History:  Procedure Laterality Date  . TOTAL HIP ARTHROPLASTY Right   . TOTAL KNEE ARTHROPLASTY Bilateral   . TUBAL LIGATION      Social History   Socioeconomic History  . Marital status: Legally Separated    Spouse name: Not on file  . Number of children: 7  . Years of education: Not on file  . Highest education level: 8th grade  Occupational History  . Not on file  Tobacco Use  . Smoking status: Never Smoker  . Smokeless tobacco: Never Used  Vaping Use  . Vaping Use: Never used  Substance and Sexual Activity  . Alcohol use: No  . Drug use: No  . Sexual activity: Not Currently  Other Topics Concern  . Not on file  Social History Narrative  . Not on file   Social Determinants of Health   Financial Resource Strain:   . Difficulty of Paying Living Expenses:   Food Insecurity:   . Worried About Charity fundraiser in the Last Year:   . Arboriculturist in the Last Year:   Transportation Needs:   . Film/video editor (Medical):   [name] Lack of Transportation (Non-Medical):   Physical Activity:   . Days of Exercise per Week:   . Minutes of Exercise per Session:   Stress:   . Feeling of Stress :   Social Connections:   . Frequency of Communication with Friends and Family:   . Frequency of Social Gatherings with Friends and Family:   . Attends Religious Services:   . Active Member of Clubs or Organizations:   . Attends Archivist Meetings:   [name] Marital Status:   Intimate Partner Violence:   . Fear of Current or Ex-Partner:   . Emotionally Abused:   [name]  Physically Abused:   . Sexually Abused:         Objective:    BP (!) 150/80   Pulse 87   Temp (!) 97.5 F (36.4 C) (Temporal)   Ht 5\' 1"  (1.549 m)   Wt 212 lb 3.2 oz (96.3 kg)   SpO2 96%   BMI 40.09 kg/m   Wt Readings from Last 3 Encounters:  12/02/19 212 lb 3.2 oz (96.3 kg)  09/02/19 215 lb 12.8 oz (97.9 kg)  07/07/19 202 lb (91.6 kg)    Physical Exam Vitals reviewed.  Constitutional:      General: She is not in acute distress.    Appearance: Normal appearance. She is morbidly obese. She is not ill-appearing, toxic-appearing  or diaphoretic.  HENT:     Head: Normocephalic and atraumatic.  Eyes:     General: No scleral icterus.       Right eye: No discharge.        Left eye: No discharge.     Conjunctiva/sclera: Conjunctivae normal.  Cardiovascular:     Rate and Rhythm: Normal rate and regular rhythm.     Heart sounds: Normal heart sounds. No murmur heard.  No friction rub. No gallop.   Pulmonary:     Effort: Pulmonary effort is normal. No respiratory distress.     Breath sounds: Normal breath sounds. No stridor. No wheezing, rhonchi or rales.  Musculoskeletal:        General: Normal range of motion.     Cervical back: Normal range of motion.  Skin:    General: Skin is warm and dry.     Capillary Refill: Capillary refill takes less than 2 seconds.  Neurological:     General: No focal deficit present.     Mental Status: She is alert and oriented to person, place, and time. Mental status is at baseline.  Psychiatric:        Mood and Affect: Mood normal.        Behavior: Behavior normal.        Thought Content: Thought content normal.        Judgment: Judgment normal.     Lab Results  Component Value Date   TSH 0.536 06/27/2019   Lab Results  Component Value Date   WBC 8.4 06/27/2019   HGB 12.1 06/27/2019   HCT 37.1 06/27/2019   MCV 83 06/27/2019   PLT 327 06/27/2019   Lab Results  Component Value Date   NA 142 06/27/2019   K 4.2 06/27/2019   CO2  23 06/27/2019   GLUCOSE 95 06/27/2019   BUN 10 06/27/2019   CREATININE 0.72 06/27/2019   BILITOT <0.2 06/27/2019   ALKPHOS 74 06/27/2019   AST 18 06/27/2019   ALT 15 06/27/2019   PROT 6.5 06/27/2019   ALBUMIN 4.2 06/27/2019   CALCIUM 9.9 06/27/2019   ANIONGAP 8 07/18/2016   Lab Results  Component Value Date   CHOL 212 (H) 06/27/2019   Lab Results  Component Value Date   HDL 93 06/27/2019   Lab Results  Component Value Date   LDLCALC 99 06/27/2019   Lab Results  Component Value Date   TRIG 118 06/27/2019   Lab Results  Component Value Date   CHOLHDL 2.3 06/27/2019   Lab Results  Component Value Date   HGBA1C 5.8 06/27/2019

## 2019-12-24 DIAGNOSIS — R21 Rash and other nonspecific skin eruption: Secondary | ICD-10-CM | POA: Diagnosis not present

## 2019-12-24 DIAGNOSIS — L299 Pruritus, unspecified: Secondary | ICD-10-CM | POA: Diagnosis not present

## 2019-12-25 ENCOUNTER — Other Ambulatory Visit: Payer: Self-pay | Admitting: Family Medicine

## 2019-12-25 DIAGNOSIS — M159 Polyosteoarthritis, unspecified: Secondary | ICD-10-CM

## 2020-01-13 DIAGNOSIS — J22 Unspecified acute lower respiratory infection: Secondary | ICD-10-CM | POA: Diagnosis not present

## 2020-01-13 DIAGNOSIS — J0100 Acute maxillary sinusitis, unspecified: Secondary | ICD-10-CM | POA: Diagnosis not present

## 2020-01-13 DIAGNOSIS — R5383 Other fatigue: Secondary | ICD-10-CM | POA: Diagnosis not present

## 2020-01-13 DIAGNOSIS — R0989 Other specified symptoms and signs involving the circulatory and respiratory systems: Secondary | ICD-10-CM | POA: Diagnosis not present

## 2020-01-13 DIAGNOSIS — R52 Pain, unspecified: Secondary | ICD-10-CM | POA: Diagnosis not present

## 2020-01-20 ENCOUNTER — Telehealth: Payer: Self-pay | Admitting: Family Medicine

## 2020-01-20 DIAGNOSIS — T3695XA Adverse effect of unspecified systemic antibiotic, initial encounter: Secondary | ICD-10-CM

## 2020-01-20 MED ORDER — FLUCONAZOLE 150 MG PO TABS: 150.0000 mg | ORAL_TABLET | Freq: Once | ORAL | 0 refills | Status: AC

## 2020-01-20 NOTE — Telephone Encounter (Signed)
Pt/ complains white discharge and vaginal itching. Pt has been on 2 rounds of abx

## 2020-01-20 NOTE — Telephone Encounter (Signed)
Diflucan sent to pharmacy.

## 2020-01-20 NOTE — Telephone Encounter (Signed)
Patient aware and verbalized understanding. °

## 2020-01-20 NOTE — Telephone Encounter (Signed)
What symptoms is she having to make her think she has a yeast infection?

## 2020-01-20 NOTE — Telephone Encounter (Signed)
°  Prescription Request  01/20/2020  What is the name of the medication or equipment?  Pt went to urgent care last week and got antibotic. She has yeast infection and would like meds called in  Have you contacted your pharmacy to request a refill? (if applicable) no  Which pharmacy would you like this sent to? [facility]   Patient notified that their request is being sent to the clinical staff for review and that they should receive a response within 2 business days.

## 2020-01-22 ENCOUNTER — Ambulatory Visit: Payer: Medicare HMO | Attending: Internal Medicine

## 2020-01-22 DIAGNOSIS — Z23 Encounter for immunization: Secondary | ICD-10-CM

## 2020-01-22 NOTE — Progress Notes (Signed)
   Covid-19 Vaccination Clinic  Name:  [name]    MRN: [id number] DOB: [birth year] ...... 01/22/2020  Melissa Zavala was observed post Covid-19 immunization for 15 minutes without incident. She was provided with Vaccine Information Sheet and instruction to access the V-Safe system.   Melissa Zavala was instructed to call 911 with any severe reactions post vaccine: [name] Difficulty breathing  . Swelling of face and throat  . A fast heartbeat  . A bad rash all over body  . Dizziness and weakness

## 2020-02-11 NOTE — Progress Notes (Signed)
This encounter was created in error - please disregard.

## 2020-02-19 ENCOUNTER — Telehealth: Payer: Self-pay | Admitting: *Deleted

## 2020-02-19 NOTE — Telephone Encounter (Signed)
Fax from [facility] request for Amlodipine 10 mg Not on current med list, Last dispensed by pharmacy 12/22/19 Last OV 11/14/19 next Ov 03/04/20 Please advise

## 2020-02-20 NOTE — Telephone Encounter (Signed)
I have called pharmacy to discuss medications. Patient has been getting Lisinopril 40 mg and Amlodipine 10 mg filled. They were both filled on 01/20/2020. Patient told them yesterday she only takes 20 mg of the Lisinopril with the Amlodipine 10 mg. The pharmacist states the amlodipine prescription was a transfer from [place] in [place]. I called patient to discuss. She states her son lives in [place] and she sometimes goes to the doctor there when she is with him. We discussed that no changes have been made by [place] or her cardiologist since February when the Amlodipine was D/C'd and she was to be on Lisinopril 40 mg once daily. She reports when she tried this before the Lisinopril made her have a headache, but she reports she has taken it for the past two days without the Amlodipine and she has not had a headache. She has an appointment scheduled for 03/04/20. She is agreeable in staying on the Lisinopril 40 mg once daily and keeping a log of her Bps until her appointment. Advised to call if she needs me or has concerns before her appointment.

## 2020-02-20 NOTE — Telephone Encounter (Signed)
Melissa Zavala

## 2020-02-20 NOTE — Telephone Encounter (Signed)
Pt was put on Lisinopril 40 and taken off of amlodipine. But, Lisinopril alone caused her head to ache. She was put back on Lisinopril 20 and amlodipine was added back

## 2020-02-20 NOTE — Telephone Encounter (Signed)
It looks like the medication was D/C'd in February of this year. Can we f/u with patient to see if she has continued taking it this whole time?

## 2020-02-20 NOTE — Telephone Encounter (Signed)
Did she say who made these changes? I don't see this in our or the cardiologists notes!

## 2020-03-04 ENCOUNTER — Telehealth: Payer: Self-pay

## 2020-03-04 ENCOUNTER — Encounter: Payer: Self-pay | Admitting: Family Medicine

## 2020-03-04 ENCOUNTER — Other Ambulatory Visit: Payer: Self-pay

## 2020-03-04 ENCOUNTER — Ambulatory Visit (INDEPENDENT_AMBULATORY_CARE_PROVIDER_SITE_OTHER): Payer: Medicare HMO | Admitting: Family Medicine

## 2020-03-04 ENCOUNTER — Ambulatory Visit: Payer: Medicare HMO | Admitting: Family Medicine

## 2020-03-04 VITALS — BP 131/84 | HR 64 | Temp 98.2°F | Ht 61.0 in | Wt 212.4 lb

## 2020-03-04 DIAGNOSIS — I10 Essential (primary) hypertension: Secondary | ICD-10-CM | POA: Diagnosis not present

## 2020-03-04 DIAGNOSIS — M8949 Other hypertrophic osteoarthropathy, multiple sites: Secondary | ICD-10-CM

## 2020-03-04 DIAGNOSIS — R609 Edema, unspecified: Secondary | ICD-10-CM | POA: Diagnosis not present

## 2020-03-04 DIAGNOSIS — M159 Polyosteoarthritis, unspecified: Secondary | ICD-10-CM

## 2020-03-04 DIAGNOSIS — K219 Gastro-esophageal reflux disease without esophagitis: Secondary | ICD-10-CM

## 2020-03-04 DIAGNOSIS — Z23 Encounter for immunization: Secondary | ICD-10-CM

## 2020-03-04 MED ORDER — ESOMEPRAZOLE MAGNESIUM 40 MG PO CPDR: DELAYED_RELEASE_CAPSULE | ORAL | 5 refills | Status: DC

## 2020-03-04 MED ORDER — POTASSIUM CHLORIDE ER 10 MEQ PO TBCR: 10.0000 meq | EXTENDED_RELEASE_TABLET | Freq: Two times a day (BID) | ORAL | 5 refills | Status: DC

## 2020-03-04 MED ORDER — FUROSEMIDE 20 MG PO TABS: 40.0000 mg | ORAL_TABLET | Freq: Every day | ORAL | 5 refills | Status: DC

## 2020-03-04 MED ORDER — TRAMADOL HCL 50 MG PO TABS: 100.0000 mg | ORAL_TABLET | Freq: Every day | ORAL | 5 refills | Status: DC | PRN

## 2020-03-04 MED ORDER — LISINOPRIL 40 MG PO TABS: 40.0000 mg | ORAL_TABLET | Freq: Every day | ORAL | 5 refills | Status: DC

## 2020-03-04 MED ORDER — CELECOXIB 200 MG PO CAPS: ORAL_CAPSULE | ORAL | 5 refills | Status: DC

## 2020-03-04 NOTE — Telephone Encounter (Signed)
Pharmacy has questions about instructions of lasix - please review directions and verify instructions.

## 2020-03-04 NOTE — Telephone Encounter (Signed)
I sent in a correction to [facility].

## 2020-03-07 ENCOUNTER — Encounter: Payer: Self-pay | Admitting: Family Medicine

## 2020-03-07 NOTE — Progress Notes (Signed)
Subjective:  Patient ID: Melissa Zavala, female    DOB: Oct 27, 1940  Age: 79 y.o. MRN: [id number]  CC: Follow-up (3 month, Blanch Media Patient)   HPI RUDENE POULSEN presents for  presents for  follow-up of hypertension. Patient has no history of headache chest pain or shortness of breath or recent cough. Patient also denies symptoms of TIA such as focal numbness or weakness. Patient denies side effects from medication. States taking it regularly.  Patient in for follow-up of GERD. Currently asymptomatic taking  PPI daily. There is no chest pain or heartburn. No hematemesis and no melena. No dysphagia or choking. Onset is remote. Progression is stable. Complicating factors, none.   Depression screen [facility] 2/9 03/04/2020 12/02/2019 09/02/2019  Decreased Interest 0 0 0  Down, Depressed, Hopeless 0 0 0  PHQ - 2 Score 0 0 0    History Haunani has a past medical history of Arthritis and Hypertension.   She has a past surgical history that includes Tubal ligation; Total knee arthroplasty (Bilateral); and Total hip arthroplasty (Right).   Her family history includes Breast cancer in her mother.She reports that she has never smoked. She has never used smokeless tobacco. She reports that she does not drink alcohol and does not use drugs.    ROS Review of Systems  Constitutional: Negative.   HENT: Negative.   Eyes: Negative for visual disturbance.  Respiratory: Negative for shortness of breath.   Cardiovascular: Negative for chest pain.  Gastrointestinal: Negative for abdominal pain.  Musculoskeletal: Positive for arthralgias.    Objective:  BP 131/84   Pulse 64   Temp 98.2 F (36.8 C) (Temporal)   Ht 5\' 1"  (1.549 m)   Wt 212 lb 6.4 oz (96.3 kg)   BMI 40.13 kg/m   BP Readings from Last 3 Encounters:  03/04/20 131/84  12/02/19 (!) 150/80  09/02/19 122/72    Wt Readings from Last 3 Encounters:  03/04/20 212 lb 6.4 oz (96.3 kg)  12/02/19 212 lb 3.2 oz (96.3 kg)  09/02/19 215 lb 12.8  oz (97.9 kg)     Physical Exam Constitutional:      General: She is not in acute distress.    Appearance: She is well-developed.  HENT:     Head: Normocephalic and atraumatic.  Eyes:     Conjunctiva/sclera: Conjunctivae normal.     Pupils: Pupils are equal, round, and reactive to light.  Neck:     Thyroid: No thyromegaly.  Cardiovascular:     Rate and Rhythm: Normal rate and regular rhythm.     Heart sounds: Normal heart sounds. No murmur heard.   Pulmonary:     Effort: Pulmonary effort is normal. No respiratory distress.     Breath sounds: Normal breath sounds. No wheezing or rales.  Abdominal:     General: Bowel sounds are normal. There is no distension.     Palpations: Abdomen is soft.     Tenderness: There is no abdominal tenderness.  Musculoskeletal:        General: Normal range of motion.     Cervical back: Normal range of motion and neck supple.  Lymphadenopathy:     Cervical: No cervical adenopathy.  Skin:    General: Skin is warm and dry.  Neurological:     Mental Status: She is alert and oriented to person, place, and time.  Psychiatric:        Behavior: Behavior normal.        Thought Content: Thought content normal.  Judgment: Judgment normal.       Assessment & Plan:   Rhiley was seen today for follow-up.  Diagnoses and all orders for this visit:  Essential hypertension -     potassium chloride (KLOR-CON) 10 MEQ tablet; Take 1 tablet (10 mEq total) by mouth 2 (two) times daily. -     lisinopril (ZESTRIL) 40 MG tablet; Take 1 tablet (40 mg total) by mouth daily.  Primary osteoarthritis involving multiple joints -     traMADol (ULTRAM) 50 MG tablet; Take 2 tablets (100 mg total) by mouth daily as needed. -     celecoxib (CELEBREX) 200 MG capsule; TAKE (1) CAPSULE DAILY.  Edema, unspecified type -     Discontinue: furosemide (LASIX) 20 MG tablet; Take 2 tablets (40 mg total) by mouth daily. TAKE 1 TABLET BY MOUTH EVERY DAY WHEN LOTS OF  SWELLING -     furosemide (LASIX) 20 MG tablet; Take 2 tablets (40 mg total) by mouth daily.  Gastroesophageal reflux disease without esophagitis -     esomeprazole (NEXIUM) 40 MG capsule; TAKE (1) CAPSULE DAILY.  Need for immunization against influenza -     Flu Vaccine QUAD High Dose(Fluad)       I have discontinued Ardie C. Craddock's furosemide. I have also changed her furosemide. Additionally, I am having her maintain her triamcinolone cream, traMADol, potassium chloride, lisinopril, esomeprazole, and celecoxib.  Allergies as of 03/04/2020      Reactions   Iodinated Diagnostic Agents Anaphylaxis   Codeine Other (See Comments)   Hallucinations   Diphenhydramine Other (See Comments)   Patient states "feels like somebody is inside of me clawing me out"      Medication List       Accurate as of March 04, 2020 11:59 PM. If you have any questions, ask your nurse or doctor.        celecoxib 200 MG capsule Commonly known as: CELEBREX TAKE (1) CAPSULE DAILY.   esomeprazole 40 MG capsule Commonly known as: NEXIUM TAKE (1) CAPSULE DAILY.   furosemide 20 MG tablet Commonly known as: LASIX Take 2 tablets (40 mg total) by mouth daily. What changed: additional instructions Changed by: Claretta Fraise, MD   lisinopril 40 MG tablet Commonly known as: ZESTRIL Take 1 tablet (40 mg total) by mouth daily.   potassium chloride 10 MEQ tablet Commonly known as: KLOR-CON Take 1 tablet (10 mEq total) by mouth 2 (two) times daily.   traMADol 50 MG tablet Commonly known as: ULTRAM Take 2 tablets (100 mg total) by mouth daily as needed.   triamcinolone cream 0.1 % Commonly known as: KENALOG Apply 1 application topically 2 (two) times daily.        Follow-up: Return in about 3 months (around 06/04/2020).  Claretta Fraise, M.D.

## 2020-03-09 ENCOUNTER — Ambulatory Visit (INDEPENDENT_AMBULATORY_CARE_PROVIDER_SITE_OTHER): Payer: Medicare HMO | Admitting: *Deleted

## 2020-03-09 ENCOUNTER — Other Ambulatory Visit: Payer: Self-pay

## 2020-03-09 DIAGNOSIS — Z23 Encounter for immunization: Secondary | ICD-10-CM

## 2020-03-16 ENCOUNTER — Ambulatory Visit (INDEPENDENT_AMBULATORY_CARE_PROVIDER_SITE_OTHER): Payer: Medicare HMO

## 2020-03-16 ENCOUNTER — Encounter: Payer: Self-pay | Admitting: Family Medicine

## 2020-03-16 ENCOUNTER — Ambulatory Visit (INDEPENDENT_AMBULATORY_CARE_PROVIDER_SITE_OTHER): Payer: Medicare HMO | Admitting: Family Medicine

## 2020-03-16 ENCOUNTER — Other Ambulatory Visit: Payer: Self-pay

## 2020-03-16 VITALS — BP 189/80 | HR 81 | Temp 97.2°F | Ht 61.0 in | Wt 213.6 lb

## 2020-03-16 DIAGNOSIS — R252 Cramp and spasm: Secondary | ICD-10-CM

## 2020-03-16 DIAGNOSIS — I10 Essential (primary) hypertension: Secondary | ICD-10-CM | POA: Diagnosis not present

## 2020-03-16 DIAGNOSIS — M7502 Adhesive capsulitis of left shoulder: Secondary | ICD-10-CM | POA: Diagnosis not present

## 2020-03-16 DIAGNOSIS — G8929 Other chronic pain: Secondary | ICD-10-CM | POA: Diagnosis not present

## 2020-03-16 DIAGNOSIS — E538 Deficiency of other specified B group vitamins: Secondary | ICD-10-CM | POA: Diagnosis not present

## 2020-03-16 DIAGNOSIS — M65341 Trigger finger, right ring finger: Secondary | ICD-10-CM | POA: Diagnosis not present

## 2020-03-16 DIAGNOSIS — M19012 Primary osteoarthritis, left shoulder: Secondary | ICD-10-CM | POA: Diagnosis not present

## 2020-03-16 DIAGNOSIS — M25512 Pain in left shoulder: Secondary | ICD-10-CM

## 2020-03-16 NOTE — Progress Notes (Signed)
Subjective: CC:B12 concern, shoulder pain PCP: Loman Brooklyn, FNP [facility]:[id number] Melissa Zavala is a 79 y.o. female presenting to clinic today for:  1.  Vitamin B12 deficiency Patient was previously treated for vitamin B12 deficiency with B12 injections.  Her PCP requested that she be seen for repeat vitamin B12 level prior to ongoing B12 injections.  Vitamin B12 was normal 2 years ago.  She does not report any balance issues but does note cramping in her legs.  She feels that she is hydrating adequately.  She eats meat.  Not on any oral vitamin B12 supplementation.  2.  Chronic left shoulder pain Patient reports that she started having left sided shoulder pain about 8 months ago.  There is no preceding injury.  She points to the anterior lateral aspect of the shoulder as the area of pain.  Pain does radiate underneath the axilla.  Symptoms are exacerbated by lying on that side and are raising her arm above 90 degrees.  She has not had overt weakness but does avoid certain positions and avoid lifting things secondary to the pain.  She has had some intermittent numbness across the posterior left hand.  She has used OTC analgesics with no improvement.  She is treated with Celebrex but this is not helping.  She does use a heating pad which seems to give some relief.  3.  Elevated blood pressure reading, hypertension Patient reports compliance with her antihypertensives and did take them today.  However, its only been about 45 minutes.  No chest pain, shortness of breath or dizziness  4.  Finger concern Patient reports the right ring finger gets stuck sometimes.  This has been ongoing for a while now.  She often will have to move the finger back into position and will pop.  ROS: Per HPI  Allergies  Allergen Reactions   Iodinated Diagnostic Agents Anaphylaxis   Codeine Other (See Comments)    Hallucinations   Diphenhydramine Other (See Comments)    Patient states "feels like somebody is inside of  me clawing me out"   Past Medical History:  Diagnosis Date   Arthritis    Hypertension     Current Outpatient Medications:    celecoxib (CELEBREX) 200 MG capsule, TAKE (1) CAPSULE DAILY., Disp: 30 capsule, Rfl: 5   esomeprazole (NEXIUM) 40 MG capsule, TAKE (1) CAPSULE DAILY., Disp: 30 capsule, Rfl: 5   furosemide (LASIX) 20 MG tablet, Take 2 tablets (40 mg total) by mouth daily., Disp: 60 tablet, Rfl: 5   lisinopril (ZESTRIL) 40 MG tablet, Take 1 tablet (40 mg total) by mouth daily., Disp: 30 tablet, Rfl: 5   potassium chloride (KLOR-CON) 10 MEQ tablet, Take 1 tablet (10 mEq total) by mouth 2 (two) times daily., Disp: 60 tablet, Rfl: 5   traMADol (ULTRAM) 50 MG tablet, Take 2 tablets (100 mg total) by mouth daily as needed., Disp: 60 tablet, Rfl: 5   triamcinolone cream (KENALOG) 0.1 %, Apply 1 application topically 2 (two) times daily. (Patient not taking: Reported on 03/04/2020), Disp: 80 g, Rfl: 0 Social History   Socioeconomic History   Marital status: Legally Separated    Spouse name: Not on file   Number of children: 7   Years of education: Not on file   Highest education level: 8th grade  Occupational History   Not on file  Tobacco Use   Smoking status: Never Smoker   Smokeless tobacco: Never Used  Vaping Use   Vaping Use: Never used  Substance and Sexual Activity   Alcohol use: No   Drug use: No   Sexual activity: Not Currently  Other Topics Concern   Not on file  Social History Narrative   Not on file   Social Determinants of Health   Financial Resource Strain:    Difficulty of Paying Living Expenses: Not on file  Food Insecurity:    Worried About [place] in the Last Year: Not on file   Ran Out of Food in the Last Year: Not on file  Transportation Needs:    Lack of Transportation (Medical): Not on file   Lack of Transportation (Non-Medical): Not on file  Physical Activity:    Days of Exercise per Week: Not on file    Minutes of Exercise per Session: Not on file  Stress:    Feeling of Stress : Not on file  Social Connections:    Frequency of Communication with Friends and Family: Not on file   Frequency of Social Gatherings with Friends and Family: Not on file   Attends Religious Services: Not on file   Active Member of Clubs or Organizations: Not on file   Attends Archivist Meetings: Not on file   Marital Status: Not on file  Intimate Partner Violence:    Fear of Current or Ex-Partner: Not on file   Emotionally Abused: Not on file   Physically Abused: Not on file   Sexually Abused: Not on file   Family History  Problem Relation Age of Onset   Breast cancer Mother     Objective: Office vital signs reviewed. BP (!) 189/80    Pulse 81    Temp (!) 97.2 F (36.2 Melissa)    Ht 5\' 1"  (1.549 m)    Wt 213 lb 9.6 oz (96.9 kg)    SpO2 93%    BMI 40.36 kg/m   Physical Examination:  General: Awake, alert, No acute distress HEENT: Normal, sclera white. Cardio: regular rate   Pulm: Normal work of breathing on room air MSK: Ambulating independently  Left shoulder: She has tenderness palpation along the [facility] joint and along the anterior shoulder.  There is no palpable abnormalities.  She has limited active and passive range of motion to 90 degrees.  She is unable to comfortably internally rotate the shoulder.  Right hand: Ring finger with appreciable trigger finger  No results found.  Assessment/ Plan: 79 y.o. female   1. Adhesive capsulitis of left shoulder Physical exam clinically consistent with adhesive capsulitis of the left shoulder.  I suspect that she either had a shoulder impingement versus tendinitis of the rotator cuff preceding this.  I'm placing a referral to physical therapy.  Advised to continue oral NSAID.  Heat recommended.  Would recommend referral to orthopedics if symptoms not improve with the aforementioned recommendations. - Ambulatory referral to Physical  Therapy  2. Chronic left shoulder pain I reviewed her x-ray which did not show any acute fractures or dislocations.  Awaiting formal review by radiology - DG Shoulder Left; Future - Ambulatory referral to Physical Therapy  3. Trigger ring finger of right hand Clinically consistent with a trigger finger.  Recommended follow-up with Dr. Warrick Parisian or Ronnald Collum for corticosteroid injection if she chooses  4. Vitamin B12 deficiency Clinically asymptomatic.  Will check vitamin B12 level. - Vitamin B12  5. Muscle cramp Check BMP given reports of cramping - Basic Metabolic Panel  6. Elevated blood pressure reading in office with diagnosis of hypertension Elevated  but may be reflective of pain and not enough time since administration of blood pressure medicine.  I would like her to follow-up in the next couple of weeks for recheck   No orders of the defined types were placed in this encounter.  No orders of the defined types were placed in this encounter.    Janora Norlander, DO [place] [phone]

## 2020-03-16 NOTE — Patient Instructions (Addendum)
You had labs performed today.  You will be contacted with the results of the labs once they are available, usually in the next 3 business days for routine lab work.  If you have an active my chart account, they will be released to your MyChart.  If you prefer to have these labs released to you via telephone, please let us know.  If you had a pap smear or biopsy performed, expect to be contacted in about 7-10 days.   Trigger Finger  Trigger finger, also called stenosing tenosynovitis,  is a condition that causes a finger to get stuck in a bent position. Each finger has a tendon, which is a tough, cord-like tissue that connects muscle to bone, and each tendon passes through a tunnel of tissue called a tendon sheath. To move your finger, your tendon needs to glide freely through the sheath. Trigger finger happens when the tendon or the sheath thickens, making it difficult to move your finger. Trigger finger can affect any finger or a thumb. It may affect more than one finger. Mild cases may clear up with rest and medicine. Severe cases require more treatment. What are the causes? Trigger finger is caused by a thickened finger tendon or tendon sheath. The cause of this thickening is not known. What increases the risk? The following factors may make you more likely to develop this condition:  Doing activities that require a strong grip.  Having rheumatoid arthritis, gout, or diabetes.  Being 67-76 years old.  Being female. What are the signs or symptoms? Symptoms of this condition include:  Pain when bending or straightening your finger.  Tenderness or swelling where your finger attaches to the palm of your hand.  A lump in the palm of your hand or on the inside of your finger.  Hearing a noise like a pop or a snap when you try to straighten your finger.  Feeling a catching or locking sensation when you try to straighten your finger.  Being unable to straighten your finger. How is this  diagnosed? This condition is diagnosed based on your symptoms and a physical exam. How is this treated? This condition may be treated by:  Resting your finger and avoiding activities that make symptoms worse.  Wearing a finger splint to keep your finger extended.  Taking NSAIDs, such as ibuprofen, to relieve pain and swelling.  Doing gentle exercises to stretch the finger as told by your health care provider.  Having medicine that reduces swelling and inflammation (steroids) injected into the tendon sheath. Injections may need to be repeated.  Having surgery to open the tendon sheath. This may be done if other treatments do not work and you cannot straighten your finger. You may need physical therapy after surgery. Follow these instructions at home: If you have a splint:  Wear the splint as told by your health care provider. Remove it only as told by your health care provider.  Loosen it if your fingers tingle, become numb, or turn cold and blue.  Keep it clean.  If the splint is not waterproof: ? Do not let it get wet. ? Cover it with a watertight covering when you take a bath or shower. Managing pain, stiffness, and swelling     If directed, apply heat to the affected area as often as told by your health care provider. Use the heat source that your health care provider recommends, such as a moist heat pack or a heating pad.  Place a towel between  your skin and the heat source.  Leave the heat on for 20-30 minutes.  Remove the heat if your skin turns bright red. This is especially important if you are unable to feel pain, heat, or cold. You may have a greater risk of getting burned. If directed, put ice on the painful area. To do this:  If you have a removable splint, remove it as told by your health care provider.  Put ice in a plastic bag.  Place a towel between your skin and the bag or between your splint and the bag.  Leave the ice on for 20 minutes, 2-3 times a  day.  Activity  Rest your finger as told by your health care provider. Avoid activities that make the pain worse.  Return to your normal activities as told by your health care provider. Ask your health care provider what activities are safe for you.  Do exercises as told by your health care provider.  Ask your health care provider when it is safe to drive if you have a splint on your hand. General instructions  Take over-the-counter and prescription medicines only as told by your health care provider.  Keep all follow-up visits as told by your health care provider. This is important. Contact a health care provider if:  Your symptoms are not improving with home care. Summary  Trigger finger, also called stenosing tenosynovitis, causes your finger to get stuck in a bent position. This can make it difficult and painful to straighten your finger.  This condition develops when a finger tendon or tendon sheath thickens.  Treatment may include resting your finger, wearing a splint, and taking medicines.  In severe cases, surgery to open the tendon sheath may be needed. This information is not intended to replace advice given to you by your health care provider. Make sure you discuss any questions you have with your health care provider. Document Revised: 09/16/2018 Document Reviewed: 09/16/2018 Elsevier Patient Education  [place].  Adhesive Capsulitis  Adhesive capsulitis, also called frozen shoulder, causes the shoulder to become stiff and painful to move. This condition happens when there is inflammation of the tendons and ligaments that surround the shoulder joint (shoulder capsule). What are the causes? This condition may be caused by:  An injury to your shoulder joint.  Straining your shoulder.  Not moving your shoulder for a period of time. This can happen if your arm was injured or in a sling.  Long-standing conditions, such as: ? Diabetes. ? Thyroid  problems. ? Heart disease. ? Stroke. ? Rheumatoid arthritis. ? Lung disease. In some cases, the cause is not known. What increases the risk? You are more likely to develop this condition if you are:  A woman.  Older than 79 years of age. What are the signs or symptoms? Symptoms of this condition include:  Pain in your shoulder when you move your arm. There may also be pain when parts of your shoulder are touched. The pain may be worse at night or when you are resting.  A sore or aching shoulder.  The inability to move your shoulder normally.  Muscle spasms. How is this diagnosed? This condition is diagnosed with a physical exam and imaging tests, such as an X-ray or MRI. How is this treated? This condition may be treated with:  Treatment of the underlying cause or condition.  Medicine. Medicine may be given to relieve pain, inflammation, or muscle spasms.  Steroid injections into the shoulder joint.  Physical  therapy. This involves performing exercises to get the shoulder moving again.  Acupuncture. This is a type of treatment that involves stimulating specific points on your body by inserting thin needles through your skin.  Shoulder manipulation. This is a procedure to move the shoulder into another position. It is done after you are given a medicine to make you fall asleep (general anesthetic). The joint may also be injected with salt water at high pressure to break down scarring.  Surgery. This may be done in severe cases when other treatments have failed. Although most people recover completely from adhesive capsulitis, some may not regain full shoulder movement. Follow these instructions at home: Managing pain, stiffness, and swelling      If directed, put ice on the injured area: ? Put ice in a plastic bag. ? Place a towel between your skin and the bag. ? Leave the ice on for 20 minutes, 2-3 times per day.  If directed, apply heat to the affected area before  you exercise. Use the heat source that your health care provider recommends, such as a moist heat pack or a heating pad. ? Place a towel between your skin and the heat source. ? Leave the heat on for 20-30 minutes. ? Remove the heat if your skin turns bright red. This is especially important if you are unable to feel pain, heat, or cold. You may have a greater risk of getting burned. General instructions  Take over-the-counter and prescription medicines only as told by your health care provider.  If you are being treated with physical therapy, follow instructions from your physical therapist.  Avoid exercises that put a lot of demand on your shoulder, such as throwing. These exercises can make pain worse.  Keep all follow-up visits as told by your health care provider. This is important. Contact a health care provider if:  You develop new symptoms.  Your symptoms get worse. Summary  Adhesive capsulitis, also called frozen shoulder, causes the shoulder to become stiff and painful to move.  You are more likely to have this condition if you are a woman and over age 11.  It is treated with physical therapy, medicines, and sometimes surgery. This information is not intended to replace advice given to you by your health care provider. Make sure you discuss any questions you have with your health care provider. Document Revised: 10/05/2017 Document Reviewed: 10/05/2017 Elsevier Patient Education  [place].

## 2020-03-17 LAB — BASIC METABOLIC PANEL
BUN/Creatinine Ratio: 16 (ref 12–28)
BUN: 12 mg/dL (ref 8–27)
CO2: 25 mmol/L (ref 20–29)
Calcium: 10.1 mg/dL (ref 8.7–10.3)
Chloride: 106 mmol/L (ref 96–106)
Creatinine, Ser: 0.74 mg/dL (ref 0.57–1.00)
GFR calc Af Amer: 89 mL/min/{1.73_m2} (ref >59)
GFR calc non Af Amer: 77 mL/min/{1.73_m2} (ref >59)
Glucose: 99 mg/dL (ref 65–99)
Potassium: 4.3 mmol/L (ref 3.5–5.2)
Sodium: 144 mmol/L (ref 134–144)

## 2020-03-17 LAB — VITAMIN B12: Vitamin B-12: 378 pg/mL (ref 232–1245)

## 2020-03-18 ENCOUNTER — Ambulatory Visit: Payer: Medicare HMO | Attending: Family Medicine | Admitting: Physical Therapy

## 2020-03-18 ENCOUNTER — Encounter: Payer: Self-pay | Admitting: Physical Therapy

## 2020-03-18 ENCOUNTER — Other Ambulatory Visit: Payer: Self-pay

## 2020-03-18 DIAGNOSIS — M6281 Muscle weakness (generalized): Secondary | ICD-10-CM | POA: Diagnosis not present

## 2020-03-18 DIAGNOSIS — G8929 Other chronic pain: Secondary | ICD-10-CM | POA: Insufficient documentation

## 2020-03-18 DIAGNOSIS — M25512 Pain in left shoulder: Secondary | ICD-10-CM | POA: Insufficient documentation

## 2020-03-18 NOTE — Therapy (Signed)
[facility] [place], [place], [address] Phone: [phone]   Fax:  [phone]  Physical Therapy Evaluation  Patient Details  Name: Melissa Zavala MRN: [id number] Date of Birth: [birth year] Referring Provider (PT): Ronnie Doss, DO   Encounter Date: 03/18/2020   PT End of Session - 03/18/20 1346    Visit Number 1    Number of Visits 12    Date for PT Re-Evaluation 05/06/20    Authorization Type human[employer] Medicare (CQ modifier), Progress note every 10th visit    PT Start Time 1115    PT Stop Time 1200    PT Time Calculation (min) 45 min    Activity Tolerance Patient tolerated treatment well    Behavior During Therapy [facility] for tasks assessed/performed           Past Medical History:  Diagnosis Date  . Arthritis   . Hypertension     Past Surgical History:  Procedure Laterality Date  . TOTAL HIP ARTHROPLASTY Right   . TOTAL KNEE ARTHROPLASTY Bilateral   . TUBAL LIGATION      There were no vitals filed for this visit.    Subjective Assessment - 03/18/20 1340    Subjective COVID-19 screening performed upon arrival. Patient arrives to physical therapy with reports of left top of the shoulder pain that radiates down toward the axilla region that began about 8 months ago, March 2021. Patient states she is unsure of how the pain began but states it has progressively worsened. Patient reports she requires increased time and rest to perform all ADLs and home activities. Patient reports pain with lifting, reaching overhead, curling her hair, and getting dressed. Patient reports pain limits her sleep and she frequently wakes up due to pain. Patient reports pain at worst as 10/10 and pain at best as 7/10. Patient's goals are to decrease pain, imporve movement, improve strength, and improve ability to perform home activities and ADLs.    Pertinent History Arthritis, bilateral TKA's.    Limitations Walking    How long can you  walk comfortably? Short community distances.    Patient Stated Goals Have surgery and get out of pain.     Currently in Pain? Yes    Pain Score 8     Pain Location Shoulder    Pain Orientation Left    Pain Descriptors / Indicators Discomfort;Throbbing    Pain Type Chronic pain    Pain Radiating Towards left axilla    Pain Onset More than a month ago    Pain Frequency Constant    Aggravating Factors  "using it"    Pain Relieving Factors "tylenol and tramadol"    Effect of Pain on Daily Activities pain with all activities.    Multiple Pain Sites Yes              [facility] PT Assessment - 03/18/20 0001      Assessment   Medical Diagnosis Chronic left shoulder pain, adhesive capsulitis of left shoulder    Referring Provider (PT) Ronnie Doss, DO    Onset Date/Surgical Date --   8 months ago; March 2021   Next MD Visit 03/29/2020    Prior Therapy yes, not for shoulder      Precautions   Precautions None      Restrictions   Weight Bearing Restrictions No      Balance Screen   Has the patient fallen in the past 6 months No    Has the patient  had a decrease in activity level because of a fear of falling?  No    Is the patient reluctant to leave their home because of a fear of falling?  No      Home Environment   Living Environment Private residence      Prior Function   Level of Independence Independent      Posture/Postural Control   Posture/Postural Control Postural limitations    Postural Limitations Rounded Shoulders;Forward head      ROM / Strength   AROM / PROM / Strength AROM;PROM;Strength      AROM   Overall AROM  Deficits;Due to pain    AROM Assessment Site Shoulder    Right/Left Shoulder Left    Left Shoulder Flexion 120 Degrees    Left Shoulder ABduction 98 Degrees    Left Shoulder Internal Rotation --   C2   Left Shoulder External Rotation 19 Degrees   L5     PROM   Overall PROM  Deficits;Due to pain    PROM Assessment Site Shoulder    Right/Left  Shoulder Left;Right    Right Shoulder Flexion 155 Degrees    Right Shoulder ABduction 100 Degrees    Right Shoulder Internal Rotation 65 Degrees    Right Shoulder External Rotation 70 Degrees    Left Shoulder Flexion 133 Degrees    Left Shoulder ABduction 60 Degrees    Left Shoulder Internal Rotation 44 Degrees    Left Shoulder External Rotation 64 Degrees      Strength   Overall Strength Deficits;Due to pain    Strength Assessment Site Shoulder    Right/Left Shoulder Right;Left    Right Shoulder Flexion 3-/5    Right Shoulder ABduction 3-/5    Right Shoulder Internal Rotation 3+/5    Right Shoulder External Rotation 3+/5    Left Shoulder Flexion 3-/5    Left Shoulder ABduction 3-/5    Left Shoulder Internal Rotation 3+/5    Left Shoulder External Rotation 3+/5      Palpation   Palpation comment Pain and tenderness to left AC joint, left bicipital groove and UT                      Objective measurements completed on examination: See above findings.       [facility] Adult PT Treatment/Exercise - 03/18/20 0001      Modalities   Modalities Electrical Stimulation;Moist Heat      Moist Heat Therapy   Number Minutes Moist Heat 10 Minutes    Moist Heat Location Shoulder      Electrical Stimulation   Electrical Stimulation Location left shoulder    Electrical Stimulation Action IFC    Electrical Stimulation Parameters 80-150 hz x10 mins    Electrical Stimulation Goals Pain                  PT Education - 03/18/20 1345    Education Details shoulder internal/external rotatation, scapular retraction, UT stretch    Person(s) Educated Patient    Methods Explanation;Demonstration;Handout    Comprehension Verbalized understanding               PT Long Term Goals - 03/18/20 1356      PT LONG TERM GOAL #1   Title Patient will be independent with HEP    Time 6    Period Weeks    Status New      PT LONG TERM GOAL #2   Title Patient will  demonstrate  140+ degrees of left shoulder flexion AROM to improve ability to perform functional tasks.    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    Time 6    Period Weeks    Status New      PT LONG TERM GOAL #4   Title Patient will report ability to perform ADLs and home activities with left shoulder pain less than or equal to 4/10.    Baseline --    Time 6    Period Weeks    Status New      PT LONG TERM GOAL #5   Title Patient will demonstrate 4/5 or greater left shoulder MMT in all planes to improve stability during functional tasks.    Time 6    Period Weeks    Status New                  Plan - 03/18/20 1347    Clinical Impression Statement Patient is a 79 year old left handed female who presents to physical therapy with left shoulder pain, decreased left shoulder ROM and decreased left shoulder MMT. Patient very tender to palpation to L AC joint, left bicipital groove, and left UT. Patient noted with forward head and rounded shoulders. Patient and PT discussed HEP and plan of care to which patient reported understanding.    Personal Factors and Comorbidities Age;Comorbidity 3+;Time since onset of injury/illness/exacerbation    Comorbidities HTN, generalized OA, osteoporosis, bilateral TKA, and R THA    Examination-Activity Limitations Lift;Hygiene/Grooming;Dressing;Caring for Others    Stability/Clinical Decision Making Stable/Uncomplicated    Clinical Decision Making Low    Rehab Potential Good    PT Frequency 2x / week    PT Duration 6 weeks    PT Treatment/Interventions ADLs/Self Care Home Management;Cryotherapy;Electrical Stimulation;Therapeutic activities;Therapeutic exercise;Patient/family education;Neuromuscular re-education;Manual techniques;Passive range of motion;Vasopneumatic Device;Moist  Heat;Iontophoresis 4mg /ml Dexamethasone    PT Next Visit Plan Conservative treatment to left shoulder, PROM, then progress to strengthening to tolerance.    PT Home Exercise Plan see patient education section    Consulted and Agree with Plan of Care Patient           Patient will benefit from skilled therapeutic intervention in order to improve the following deficits and impairments:  Pain, Decreased activity tolerance, Decreased strength, Decreased range of motion, Impaired UE functional use, Postural dysfunction  Visit Diagnosis: Chronic left shoulder pain - Plan: PT plan of care cert/re-cert  Muscle weakness (generalized) - Plan: PT plan of care cert/re-cert     Problem List Patient Active Problem List   Diagnosis Date Noted  . Endometrial polyp 06/12/2019  . Edema 12/06/2017  . Vitamin B12 deficiency 12/06/2017  . Essential hypertension 02/19/2017  . Gastroesophageal reflux disease without esophagitis 06/11/2016  . Personal history of ovarian cyst 06/11/2016  . Age-related osteoporosis without current pathological fracture 06/11/2016  . Bilateral carpal tunnel syndrome 06/09/2016  . Generalized OA 06/09/2016  . Morbid obesity with BMI of 40.0-44.9, adult [facility]) 01/03/2016    Gabriela Eves, PT, DPT 03/18/2020, 2:13 PM  [facility] [place], [place], [address] Phone: [phone]   Fax:  [phone]  Name: ATHENA BALTZ MRN: [id number] Date of Birth: 1940/11/13

## 2020-03-22 ENCOUNTER — Encounter: Payer: Self-pay | Admitting: Physical Therapy

## 2020-03-22 ENCOUNTER — Ambulatory Visit: Payer: Medicare HMO | Admitting: Physical Therapy

## 2020-03-22 ENCOUNTER — Other Ambulatory Visit: Payer: Self-pay | Admitting: Family Medicine

## 2020-03-22 ENCOUNTER — Other Ambulatory Visit: Payer: Self-pay

## 2020-03-22 DIAGNOSIS — M25512 Pain in left shoulder: Secondary | ICD-10-CM | POA: Diagnosis not present

## 2020-03-22 DIAGNOSIS — M8949 Other hypertrophic osteoarthropathy, multiple sites: Secondary | ICD-10-CM

## 2020-03-22 DIAGNOSIS — M6281 Muscle weakness (generalized): Secondary | ICD-10-CM | POA: Diagnosis not present

## 2020-03-22 DIAGNOSIS — G8929 Other chronic pain: Secondary | ICD-10-CM

## 2020-03-22 DIAGNOSIS — M159 Polyosteoarthritis, unspecified: Secondary | ICD-10-CM

## 2020-03-22 NOTE — Therapy (Signed)
[facility] [place], [place], [address] Phone: [phone]   Fax:  [phone]  Physical Therapy Treatment  Patient Details  Name: Melissa Zavala MRN: [id number] Date of Birth: 02-Nov-1940 Referring Provider (PT): Ronnie Doss, DO   Encounter Date: 03/22/2020   PT End of Session - 03/22/20 0956    Visit Number 2    Number of Visits 12    Date for PT Re-Evaluation 05/06/20    Authorization Type human[employer] Medicare (CQ modifier), Progress note every 10th visit    PT Start Time 0949    PT Stop Time 1031    PT Time Calculation (min) 42 min    Activity Tolerance Patient tolerated treatment well    Behavior During Therapy [facility] for tasks assessed/performed           Past Medical History:  Diagnosis Date  . Arthritis   . Hypertension     Past Surgical History:  Procedure Laterality Date  . TOTAL HIP ARTHROPLASTY Right   . TOTAL KNEE ARTHROPLASTY Bilateral   . TUBAL LIGATION      There were no vitals filed for this visit.   Subjective Assessment - 03/22/20 0955    Subjective COVID 19 screening performed on patient upon arrival. Reports soreness constantly but is experincing greater pain with reaching up.    Pertinent History Arthritis, bilateral TKA's.    Limitations Walking    How long can you walk comfortably? Short community distances.    Patient Stated Goals Have surgery and get out of pain.     Currently in Pain? Yes    Pain Score 7     Pain Location Shoulder    Pain Orientation Left    Pain Descriptors / Indicators Sore;Discomfort    Pain Type Chronic pain    Pain Onset More than a month ago    Pain Frequency Constant    Aggravating Factors  Shoulder flexion              [facility] PT Assessment - 03/22/20 0001      Assessment   Medical Diagnosis Chronic left shoulder pain, adhesive capsulitis of left shoulder    Referring Provider (PT) Ronnie Doss, DO    Next MD Visit 03/29/2020    Prior  Therapy yes, not for shoulder      Precautions   Precautions None      Restrictions   Weight Bearing Restrictions No                         [facility] Adult PT Treatment/Exercise - 03/22/20 0001      Exercises   Exercises Shoulder      Shoulder Exercises: Supine   External Rotation AAROM;Left;20 reps    Flexion AAROM;Both;20 reps      Shoulder Exercises: Standing   Internal Rotation AAROM;Both;20 reps    Extension AAROM;Both;20 reps      Shoulder Exercises: Pulleys   Flexion Other (comment)   x8 min     Shoulder Exercises: ROM/Strengthening   Wall [place] in flex x20 reps      Modalities   Modalities Electrical Stimulation;Moist Heat      Moist Heat Therapy   Number Minutes Moist Heat 15 Minutes    Moist Heat Location Shoulder      Electrical Stimulation   Electrical Stimulation Location L shoulder    Electrical Stimulation Action Pre-Mod    Electrical Stimulation Parameters 80-150 hz x15 min  Electrical Stimulation Goals Pain      Manual Therapy   Manual Therapy Passive ROM;Soft tissue mobilization    Soft tissue mobilization STW to L proximal bicep, deltoids to reduce pain and tone    Passive ROM Gentle PROM of L shoulder into flexion, ER with gentle holds at end range                       PT Long Term Goals - 03/18/20 1356      PT LONG TERM GOAL #1   Title Patient will be independent with HEP    Time 6    Period Weeks    Status New      PT LONG TERM GOAL #2   Title Patient will demonstrate 140+ degrees of left shoulder flexion AROM to improve ability to perform functional tasks.    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    Time 6    Period Weeks    Status New      PT LONG TERM GOAL #4   Title Patient will report ability to perform ADLs and home activities  with left shoulder pain less than or equal to 4/10.    Baseline --    Time 6    Period Weeks    Status New      PT LONG TERM GOAL #5   Title Patient will demonstrate 4/5 or greater left shoulder MMT in all planes to improve stability during functional tasks.    Time 6    Period Weeks    Status New                 Plan - 03/22/20 1027    Clinical Impression Statement Patient presented in clinic with reports of constant soreness and numbness of proximal L shoulder. Patient experiences greatest increase of pain with OH reaching into flexion. Patient guided through light AAROM exercises with discomfort reported at end range of all motions. Patient encouraged to only complete within pain free range. No smooth ROM noted during PROM into flexion due to "catches" of pain. L deltoid tightness palpable during assessment as well. Normal modalities response noted following removal of the modalities. Patient indicated that she was seen in Urgent Care approximately 6 months ago and was told she was experiencing neck spasms.    Personal Factors and Comorbidities Age;Comorbidity 3+;Time since onset of injury/illness/exacerbation    Comorbidities HTN, generalized OA, osteoporosis, bilateral TKA, and R THA    Examination-Activity Limitations Lift;Hygiene/Grooming;Dressing;Caring for Others    Stability/Clinical Decision Making Stable/Uncomplicated    Rehab Potential Good    PT Frequency 2x / week    PT Duration 6 weeks    PT Treatment/Interventions ADLs/Self Care Home Management;Cryotherapy;Electrical Stimulation;Therapeutic activities;Therapeutic exercise;Patient/family education;Neuromuscular re-education;Manual techniques;Passive range of motion;Vasopneumatic Device;Moist Heat;Iontophoresis 4mg /ml Dexamethasone    PT Next Visit Plan Conservative treatment to left shoulder, PROM, then progress to strengthening to tolerance.    PT Home Exercise Plan see patient education section    Consulted and  Agree with Plan of Care Patient           Patient will benefit from skilled therapeutic intervention in order to improve the following deficits and impairments:  Pain, Decreased activity tolerance, Decreased  strength, Decreased range of motion, Impaired UE functional use, Postural dysfunction  Visit Diagnosis: Chronic left shoulder pain  Muscle weakness (generalized)     Problem List Patient Active Problem List   Diagnosis Date Noted  . Endometrial polyp 06/12/2019  . Edema 12/06/2017  . Vitamin B12 deficiency 12/06/2017  . Essential hypertension 02/19/2017  . Gastroesophageal reflux disease without esophagitis 06/11/2016  . Personal history of ovarian cyst 06/11/2016  . Age-related osteoporosis without current pathological fracture 06/11/2016  . Bilateral carpal tunnel syndrome 06/09/2016  . Generalized OA 06/09/2016  . Morbid obesity with BMI of 40.0-44.9, adult [facility]) 01/03/2016    Standley Brooking, PTA 03/22/2020, 10:39 AM  [facility] [place], [place], [address] Phone: [phone]   Fax:  [phone]  Name: Melissa Zavala MRN: [id number] Date of Birth: 1940/05/24

## 2020-03-26 ENCOUNTER — Ambulatory Visit: Payer: Medicare HMO | Admitting: Physical Therapy

## 2020-03-26 ENCOUNTER — Other Ambulatory Visit: Payer: Self-pay

## 2020-03-26 ENCOUNTER — Encounter: Payer: Self-pay | Admitting: Physical Therapy

## 2020-03-26 DIAGNOSIS — G8929 Other chronic pain: Secondary | ICD-10-CM

## 2020-03-26 DIAGNOSIS — M25512 Pain in left shoulder: Secondary | ICD-10-CM | POA: Diagnosis not present

## 2020-03-26 DIAGNOSIS — M6281 Muscle weakness (generalized): Secondary | ICD-10-CM

## 2020-03-26 NOTE — Therapy (Signed)
[facility] [place], [place], [address] Phone: [phone]   Fax:  [phone]  Physical Therapy Treatment  Patient Details  Name: Melissa Zavala MRN: [id number] Date of Birth: [birth year] Referring Provider (PT): Ronnie Doss, DO   Encounter Date: 03/26/2020   PT End of Session - 03/26/20 1026    Visit Number 3    Number of Visits 12    Date for PT Re-Evaluation 05/06/20    Authorization Type human[employer] Medicare (CQ modifier), Progress note every 10th visit    PT Start Time 0946    PT Stop Time 1029    PT Time Calculation (min) 43 min    Activity Tolerance Patient tolerated treatment well    Behavior During Therapy [facility] for tasks assessed/performed           Past Medical History:  Diagnosis Date  . Arthritis   . Hypertension     Past Surgical History:  Procedure Laterality Date  . TOTAL HIP ARTHROPLASTY Right   . TOTAL KNEE ARTHROPLASTY Bilateral   . TUBAL LIGATION      There were no vitals filed for this visit.   Subjective Assessment - 03/26/20 1024    Subjective COVID 19 screening performed on patient upon arrival. Arrived with reports of some improvement.    Pertinent History Arthritis, bilateral TKA's.    Limitations Walking    How long can you walk comfortably? Short community distances.    Patient Stated Goals Have surgery and get out of pain.     Currently in Pain? Yes    Pain Score 6     Pain Location Shoulder    Pain Orientation Left    Pain Descriptors / Indicators Discomfort;Sore    Pain Type Chronic pain    Pain Radiating Towards L axila    Pain Onset More than a month ago    Pain Frequency Constant    Aggravating Factors  Shoulder flexion/ reaching              [facility] PT Assessment - 03/26/20 0001      Assessment   Medical Diagnosis Chronic left shoulder pain, adhesive capsulitis of left shoulder    Referring Provider (PT) Ronnie Doss, DO    Next MD Visit 04/02/2020     Prior Therapy yes, not for shoulder      Precautions   Precautions None      Restrictions   Weight Bearing Restrictions No                         [facility] Adult PT Treatment/Exercise - 03/26/20 0001      Shoulder Exercises: Seated   Protraction AAROM;Both;15 reps    External Rotation AAROM;Left;15 reps    Flexion AAROM;Left;15 reps    Other Seated Exercises L bicep curls 2# x30 reps      Shoulder Exercises: Pulleys   Flexion 5 minutes      Shoulder Exercises: ROM/Strengthening   [place] in sitting; flex/ext x2 min, CW circles x2 min. CCW circles x2 min      Shoulder Exercises: Isometric Strengthening   Flexion 5X5"    Extension 5X5"    External Rotation 5X5"    Internal Rotation 5X5"    ABduction 5X5"      Modalities   Modalities Electrical Stimulation;Moist Heat      Moist Heat Therapy   Number Minutes Moist Heat 15 Minutes    Moist Heat Location Shoulder  Acupuncturist Location L shoulder    Electrical Stimulation Action Pre-mod    Electrical Stimulation Parameters 80-150 hz x15 min    Electrical Stimulation Goals Pain                       PT Long Term Goals - 03/18/20 1356      PT LONG TERM GOAL #1   Title Patient will be independent with HEP    Time 6    Period Weeks    Status New      PT LONG TERM GOAL #2   Title Patient will demonstrate 140+ degrees of left shoulder flexion AROM to improve ability to perform functional tasks.    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    Time 6    Period Weeks    Status New      PT LONG TERM GOAL #4   Title Patient will report ability to perform ADLs and home activities with left shoulder pain less than or equal to 4/10.    Baseline --    Time 6    Period Weeks    Status New       PT LONG TERM GOAL #5   Title Patient will demonstrate 4/5 or greater left shoulder MMT in all planes to improve stability during functional tasks.    Time 6    Period Weeks    Status New                 Plan - 03/26/20 1027    Clinical Impression Statement Patient presented in clinic with continued reports of pain while reaching to pick up something or comb her hair. Patient guided through more antigravity AAROM exercises as well as light isometrics with intermittant reports of anterior L shoulder pain into axilla. Normal modalities response noted following removal of the modalities.    Personal Factors and Comorbidities Age;Comorbidity 3+;Time since onset of injury/illness/exacerbation    Comorbidities HTN, generalized OA, osteoporosis, bilateral TKA, and R THA    Examination-Activity Limitations Lift;Hygiene/Grooming;Dressing;Caring for Others    Stability/Clinical Decision Making Stable/Uncomplicated    Rehab Potential Good    PT Frequency 2x / week    PT Duration 6 weeks    PT Treatment/Interventions ADLs/Self Care Home Management;Cryotherapy;Electrical Stimulation;Therapeutic activities;Therapeutic exercise;Patient/family education;Neuromuscular re-education;Manual techniques;Passive range of motion;Vasopneumatic Device;Moist Heat;Iontophoresis 4mg /ml Dexamethasone    PT Next Visit Plan Conservative treatment to left shoulder, PROM, then progress to strengthening to tolerance.    PT Home Exercise Plan see patient education section    Consulted and Agree with Plan of Care Patient           Patient will benefit from skilled therapeutic intervention in order to improve the following deficits and impairments:  Pain, Decreased activity tolerance, Decreased strength, Decreased range of motion, Impaired UE functional use, Postural dysfunction  Visit Diagnosis: Chronic left shoulder pain  Muscle weakness (generalized)     Problem List Patient Active Problem List   Diagnosis  Date Noted  . Endometrial polyp 06/12/2019  . Edema 12/06/2017  . Vitamin B12 deficiency 12/06/2017  . Essential hypertension 02/19/2017  . Gastroesophageal reflux disease without esophagitis 06/11/2016  . Personal history of ovarian cyst 06/11/2016  .  Age-related osteoporosis without current pathological fracture 06/11/2016  . Bilateral carpal tunnel syndrome 06/09/2016  . Generalized OA 06/09/2016  . Morbid obesity with BMI of 40.0-44.9, adult [facility]) 01/03/2016    Standley Brooking, PTA 03/26/2020, 10:42 AM  [facility] [place], [place], [address] Phone: [phone]   Fax:  [phone]  Name: Melissa Zavala MRN: [id number] Date of Birth: [birth year]

## 2020-03-31 ENCOUNTER — Ambulatory Visit: Payer: Medicare HMO | Admitting: Physical Therapy

## 2020-03-31 ENCOUNTER — Other Ambulatory Visit: Payer: Self-pay

## 2020-03-31 ENCOUNTER — Encounter: Payer: Self-pay | Admitting: Physical Therapy

## 2020-03-31 DIAGNOSIS — G8929 Other chronic pain: Secondary | ICD-10-CM

## 2020-03-31 DIAGNOSIS — M6281 Muscle weakness (generalized): Secondary | ICD-10-CM

## 2020-03-31 DIAGNOSIS — M25512 Pain in left shoulder: Secondary | ICD-10-CM

## 2020-03-31 NOTE — Therapy (Signed)
[facility] [place], [place], [address] Phone: [phone]   Fax:  [phone]  Physical Therapy Treatment  Patient Details  Name: Melissa Zavala MRN: [id number] Date of Birth: [birth year] Referring Provider (PT): Ronnie Doss, DO   Encounter Date: 03/31/2020   PT End of Session - 03/31/20 1349    Visit Number 4    Number of Visits 12    Date for PT Re-Evaluation 05/06/20    Authorization Type human[employer] Medicare (CQ modifier), Progress note every 10th visit    PT Start Time 1348    Activity Tolerance Patient tolerated treatment well    Behavior During Therapy [facility] for tasks assessed/performed           Past Medical History:  Diagnosis Date  . Arthritis   . Hypertension     Past Surgical History:  Procedure Laterality Date  . TOTAL HIP ARTHROPLASTY Right   . TOTAL KNEE ARTHROPLASTY Bilateral   . TUBAL LIGATION      There were no vitals filed for this visit.   Subjective Assessment - 03/31/20 1348    Subjective COVID 19 screening performed on patient upon arrival. Reports of L shoulder throbbing still.    Pertinent History Arthritis, bilateral TKA's.    Limitations Walking    How long can you walk comfortably? Short community distances.    Patient Stated Goals Have surgery and get out of pain.     Currently in Pain? Yes    Pain Score 7     Pain Location Shoulder    Pain Orientation Left    Pain Descriptors / Indicators Throbbing    Pain Type Chronic pain    Pain Onset More than a month ago    Pain Frequency Intermittent    Aggravating Factors  Shoulder flexion/ reaching              [facility] PT Assessment - 03/31/20 0001      Assessment   Medical Diagnosis Chronic left shoulder pain, adhesive capsulitis of left shoulder    Referring Provider (PT) Ronnie Doss, DO    Next MD Visit 04/02/2020   trigger finger injection   Prior Therapy yes, not for shoulder      Precautions   Precautions None                          [facility] Adult PT Treatment/Exercise - 03/31/20 0001      Shoulder Exercises: Seated   Protraction AAROM;Both;15 reps    External Rotation AAROM;Left;15 reps    Flexion AAROM;Both;5 reps   limited by pain     Shoulder Exercises: Pulleys   Flexion 5 minutes   pain in L axilla with shoulder flexion     Shoulder Exercises: ROM/Strengthening   Ranger in sitting; flex/ext x2 min, CW circles x2 min. CCW circles x2 min      Shoulder Exercises: Isometric Strengthening   Flexion 5X5"    Extension 5X5"    External Rotation 5X5"    Internal Rotation 5X5"    ABduction 5X5"      Modalities   Modalities Electrical Stimulation;Moist Heat      Moist Heat Therapy   Number Minutes Moist Heat 15 Minutes    Moist Heat Location Shoulder      Electrical Stimulation   Electrical Stimulation Location L shoulder    Electrical Stimulation Action Pre-Mod    Electrical Stimulation Parameters 80-150 hz x15 min  Electrical Stimulation Goals Pain      Manual Therapy   Manual Therapy Soft tissue mobilization    Soft tissue mobilization STW to L proximal bicep, deltoids, lateral pectoralis to reduce tone and pain                       PT Long Term Goals - 03/18/20 1356      PT LONG TERM GOAL #1   Title Patient will be independent with HEP    Time 6    Period Weeks    Status New      PT LONG TERM GOAL #2   Title Patient will demonstrate 140+ degrees of left shoulder flexion AROM to improve ability to perform functional tasks.    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    Time 6    Period Weeks    Status New      PT LONG TERM GOAL #4   Title Patient will report ability to perform ADLs and home activities with left shoulder pain less than or equal to 4/10.    Baseline --     Time 6    Period Weeks    Status New      PT LONG TERM GOAL #5   Title Patient will demonstrate 4/5 or greater left shoulder MMT in all planes to improve stability during functional tasks.    Time 6    Period Weeks    Status New                 Plan - 03/31/20 1429    Clinical Impression Statement Patient presented in clinic with reports of continued high level L shoulder pain. Patient still radiating into L axilla with shoulder flexion. Patient guided through light AAROM and isometrics with greater pain reported with AAROM flexion, isometric IR. Patient very tender to palpation especially at proximal L bicep. Normal modalities response noted following removal of the modalities. Patient encouraged to consult with PCP as limited progress and continued L shoulder pain.    Personal Factors and Comorbidities Age;Comorbidity 3+;Time since onset of injury/illness/exacerbation    Comorbidities HTN, generalized OA, osteoporosis, bilateral TKA, and R THA    Examination-Activity Limitations Lift;Hygiene/Grooming;Dressing;Caring for Others    Stability/Clinical Decision Making Stable/Uncomplicated    Rehab Potential Good    PT Frequency 2x / week    PT Duration 6 weeks    PT Treatment/Interventions ADLs/Self Care Home Management;Cryotherapy;Electrical Stimulation;Therapeutic activities;Therapeutic exercise;Patient/family education;Neuromuscular re-education;Manual techniques;Passive range of motion;Vasopneumatic Device;Moist Heat;Iontophoresis 4mg /ml Dexamethasone    PT Next Visit Plan Conservative treatment to left shoulder, PROM, then progress to strengthening to tolerance.    PT Home Exercise Plan see patient education section    Consulted and Agree with Plan of Care Patient           Patient will benefit from skilled therapeutic intervention in order to improve the following deficits and impairments:  Pain, Decreased activity tolerance, Decreased strength, Decreased range of motion,  Impaired UE functional use, Postural dysfunction  Visit Diagnosis: Chronic left shoulder pain  Muscle weakness (generalized)     Problem List Patient Active Problem List   Diagnosis Date Noted  . Endometrial polyp 06/12/2019  . Edema 12/06/2017  .  Vitamin B12 deficiency 12/06/2017  . Essential hypertension 02/19/2017  . Gastroesophageal reflux disease without esophagitis 06/11/2016  . Personal history of ovarian cyst 06/11/2016  . Age-related osteoporosis without current pathological fracture 06/11/2016  . Bilateral carpal tunnel syndrome 06/09/2016  . Generalized OA 06/09/2016  . Morbid obesity with BMI of 40.0-44.9, adult [facility]) 01/03/2016    Standley Brooking, PTA 03/31/2020, 2:53 PM  [facility] [place], [place], [address] Phone: [phone]   Fax:  [phone]  Name: Melissa Zavala MRN: [id number] Date of Birth: 07-05-1940

## 2020-04-02 ENCOUNTER — Ambulatory Visit: Payer: Medicare HMO | Admitting: Physical Therapy

## 2020-04-02 ENCOUNTER — Encounter: Payer: Self-pay | Admitting: Physical Therapy

## 2020-04-02 ENCOUNTER — Other Ambulatory Visit: Payer: Self-pay

## 2020-04-02 ENCOUNTER — Encounter: Payer: Self-pay | Admitting: Nurse Practitioner

## 2020-04-02 ENCOUNTER — Ambulatory Visit (INDEPENDENT_AMBULATORY_CARE_PROVIDER_SITE_OTHER): Payer: Medicare HMO | Admitting: Nurse Practitioner

## 2020-04-02 VITALS — BP 180/76 | HR 79 | Temp 98.3°F | Resp 20 | Ht 61.0 in | Wt 212.0 lb

## 2020-04-02 DIAGNOSIS — M25512 Pain in left shoulder: Secondary | ICD-10-CM

## 2020-04-02 DIAGNOSIS — M65341 Trigger finger, right ring finger: Secondary | ICD-10-CM

## 2020-04-02 DIAGNOSIS — G8929 Other chronic pain: Secondary | ICD-10-CM | POA: Diagnosis not present

## 2020-04-02 DIAGNOSIS — M6281 Muscle weakness (generalized): Secondary | ICD-10-CM | POA: Diagnosis not present

## 2020-04-02 MED ORDER — LIDOCAINE HCL 2 % IJ SOLN
0.5000 mL | Freq: Once | INTRAMUSCULAR | Status: AC
  Administered 2020-04-02: 10 mg

## 2020-04-02 MED ORDER — METHYLPREDNISOLONE ACETATE 40 MG/ML IJ SUSP
40.0000 mg | Freq: Once | INTRAMUSCULAR | Status: AC
  Administered 2020-04-02: 40 mg via INTRAMUSCULAR

## 2020-04-02 NOTE — Patient Instructions (Signed)
Trigger Finger  Trigger finger, also called stenosing tenosynovitis,  is a condition that causes a finger to get stuck in a bent position. Each finger has a tendon, which is a tough, cord-like tissue that connects muscle to bone, and each tendon passes through a tunnel of tissue called a tendon sheath. To move your finger, your tendon needs to glide freely through the sheath. Trigger finger happens when the tendon or the sheath thickens, making it difficult to move your finger. Trigger finger can affect any finger or a thumb. It may affect more than one finger. Mild cases may clear up with rest and medicine. Severe cases require more treatment. What are the causes? Trigger finger is caused by a thickened finger tendon or tendon sheath. The cause of this thickening is not known. What increases the risk? The following factors may make you more likely to develop this condition:  Doing activities that require a strong grip.  Having rheumatoid arthritis, gout, or diabetes.  Being 40-60 years old.  Being female. What are the signs or symptoms? Symptoms of this condition include:  Pain when bending or straightening your finger.  Tenderness or swelling where your finger attaches to the palm of your hand.  A lump in the palm of your hand or on the inside of your finger.  Hearing a noise like a pop or a snap when you try to straighten your finger.  Feeling a catching or locking sensation when you try to straighten your finger.  Being unable to straighten your finger. How is this diagnosed? This condition is diagnosed based on your symptoms and a physical exam. How is this treated? This condition may be treated by:  Resting your finger and avoiding activities that make symptoms worse.  Wearing a finger splint to keep your finger extended.  Taking NSAIDs, such as ibuprofen, to relieve pain and swelling.  Doing gentle exercises to stretch the finger as told by your health care provider.   Having medicine that reduces swelling and inflammation (steroids) injected into the tendon sheath. Injections may need to be repeated.  Having surgery to open the tendon sheath. This may be done if other treatments do not work and you cannot straighten your finger. You may need physical therapy after surgery. Follow these instructions at home: If you have a splint:  Wear the splint as told by your health care provider. Remove it only as told by your health care provider.  Loosen it if your fingers tingle, become numb, or turn cold and blue.  Keep it clean.  If the splint is not waterproof: ? Do not let it get wet. ? Cover it with a watertight covering when you take a bath or shower. Managing pain, stiffness, and swelling     If directed, apply heat to the affected area as often as told by your health care provider. Use the heat source that your health care provider recommends, such as a moist heat pack or a heating pad.  Place a towel between your skin and the heat source.  Leave the heat on for 20-30 minutes.  Remove the heat if your skin turns bright red. This is especially important if you are unable to feel pain, heat, or cold. You may have a greater risk of getting burned. If directed, put ice on the painful area. To do this:  If you have a removable splint, remove it as told by your health care provider.  Put ice in a plastic bag.  Place a   towel between your skin and the bag or between your splint and the bag.  Leave the ice on for 20 minutes, 2-3 times a day.  Activity  Rest your finger as told by your health care provider. Avoid activities that make the pain worse.  Return to your normal activities as told by your health care provider. Ask your health care provider what activities are safe for you.  Do exercises as told by your health care provider.  Ask your health care provider when it is safe to drive if you have a splint on your hand. General instructions   Take over-the-counter and prescription medicines only as told by your health care provider.  Keep all follow-up visits as told by your health care provider. This is important. Contact a health care provider if:  Your symptoms are not improving with home care. Summary  Trigger finger, also called stenosing tenosynovitis, causes your finger to get stuck in a bent position. This can make it difficult and painful to straighten your finger.  This condition develops when a finger tendon or tendon sheath thickens.  Treatment may include resting your finger, wearing a splint, and taking medicines.  In severe cases, surgery to open the tendon sheath may be needed. This information is not intended to replace advice given to you by your health care provider. Make sure you discuss any questions you have with your health care provider. Document Revised: 09/16/2018 Document Reviewed: 09/16/2018 Elsevier Patient Education  2020 Elsevier Inc.  

## 2020-04-02 NOTE — Progress Notes (Signed)
   Subjective:    Patient ID: Melissa Zavala, female    DOB: 08/22/1940, 79 y.o.   MRN: [id number]   Chief Complaint: ? trigger finger and Left arm not much better (Taking therapy)   HPI - has trigger finger right ring finger, needs it injected. Has been bothering her ofr several months. - left shoulder pain- has been doing physicaltherapy and is no better. They sugested she have a MRI.    Review of Systems  Constitutional: Negative for diaphoresis.  Eyes: Negative for pain.  Respiratory: Negative for shortness of breath.   Cardiovascular: Negative for chest pain, palpitations and leg swelling.  Gastrointestinal: Negative for abdominal pain.  Endocrine: Negative for polydipsia.  Skin: Negative for rash.  Neurological: Negative for dizziness, weakness and headaches.  Hematological: Does not bruise/bleed easily.  All other systems reviewed and are negative.       Objective:   Physical Exam Vitals and nursing note reviewed.  Constitutional:      Appearance: Normal appearance.  Cardiovascular:     Rate and Rhythm: Normal rate and regular rhythm.     Heart sounds: Normal heart sounds.  Pulmonary:     Breath sounds: Normal breath sounds.  Musculoskeletal:     Comments: Decreased ROM of left sholder with pain on abduction and internal roatation.  Skin:    General: Skin is warm.  Neurological:     General: No focal deficit present.     Mental Status: She is alert.    BP (!) 180/76   Pulse 79   Temp 98.3 F (36.8 C) (Temporal)   Resp 20   Ht 5\' 1"  (1.549 m)   Wt 212 lb (96.2 kg)   SpO2 95%   BMI 40.06 kg/m   Joint Injection/Arthrocentesis  Date/Time: 04/02/2020 2:35 PM Performed by: Chevis Pretty, FNP Authorized by: Hassell Done, Shauntia-Margaret, FNP  Indications: pain (trigger finger)  Body area: finger Location details: right ring finger Local anesthesia used: no  Anesthesia: Local anesthesia used: no  Sedation: Patient sedated: no  Preparation:  Patient was prepped and draped in the usual sterile fashion. Needle gauge: 27g. Ultrasound guidance: no Approach: palmar surface. Methylprednisolone amount: 40 mg Lidocaine 2% amount: 0.5 mL          Assessment & Plan:  Melissa Zavala in today with chief complaint of ? trigger finger and Left arm not much better (Taking therapy)   1. Acquired trigger finger of right ring finger Trigger finger injection - methylPREDNISolone acetate (DEPO-MEDROL) injection 40 mg - lidocaine (XYLOCAINE) 2 % (with pres) injection 10 mg  2. Acute pain of left shoulder Rest Ice Will talk once MRI is complete - MR Shoulder Left Wo Contrast; Future    The above assessment and management plan was discussed with the patient. The patient verbalized understanding of and has agreed to the management plan. Patient is aware to call the clinic if symptoms persist or worsen. Patient is aware when to return to the clinic for a follow-up visit. Patient educated on when it is appropriate to go to the emergency department.   [name] Hassell Done, FNP

## 2020-04-02 NOTE — Therapy (Signed)
[facility] [place], [place], [address] Phone: [phone]   Fax:  [phone]  Physical Therapy Treatment  Patient Details  Name: Melissa Zavala MRN: [id number] Date of Birth: Oct 01, 1940 Referring Provider (PT): Ronnie Doss, DO   Encounter Date: 04/02/2020   PT End of Session - 04/02/20 1212    Visit Number 5    Number of Visits 12    Date for PT Re-Evaluation 05/06/20    Authorization Type [employer] Medicare (CQ modifier), Progress note every 10th visit    PT Start Time 1116    PT Stop Time 1208    PT Time Calculation (min) 52 min    Activity Tolerance Patient tolerated treatment well    Behavior During Therapy [facility] for tasks assessed/performed           Past Medical History:  Diagnosis Date  . Arthritis   . Hypertension     Past Surgical History:  Procedure Laterality Date  . TOTAL HIP ARTHROPLASTY Right   . TOTAL KNEE ARTHROPLASTY Bilateral   . TUBAL LIGATION      There were no vitals filed for this visit.   Subjective Assessment - 04/02/20 1118    Subjective COVID 19 screening performed on patient upon arrival. Some better but still hurting.    Pertinent History Arthritis, bilateral TKA's.    Limitations Walking    How long can you walk comfortably? Short community distances.    Patient Stated Goals Have surgery and get out of pain.     Currently in Pain? Yes    Pain Score --   No pain score provided   Pain Location Shoulder    Pain Orientation Left    Pain Descriptors / Indicators Discomfort    Pain Type Chronic pain    Pain Radiating Towards L axila    Pain Onset More than a month ago    Pain Frequency Intermittent              [facility] PT Assessment - 04/02/20 0001      Assessment   Medical Diagnosis Chronic left shoulder pain, adhesive capsulitis of left shoulder    Referring Provider (PT) Ronnie Doss, DO    Next MD Visit 04/02/2020    Prior Therapy yes, not for shoulder        Precautions   Precautions None                         [facility] Adult PT Treatment/Exercise - 04/02/20 0001      Shoulder Exercises: Pulleys   Flexion 5 minutes      Shoulder Exercises: ROM/Strengthening   Ranger in sitting; flex/ext x3 min, CW circles x3 min. CCW circles x3 min      Shoulder Exercises: Isometric Strengthening   Flexion 5X5"    Extension 5X5"    External Rotation 5X5"    Internal Rotation 5X5"    ABduction 5X5"      Modalities   Modalities Electrical Stimulation;Iontophoresis      Acupuncturist Location L shoulder    Electrical Stimulation Action Pre-Mod    Electrical Stimulation Parameters 80-150 hz x15 min    Electrical Stimulation Goals Pain      Iontophoresis   Type of Iontophoresis Dexamethasone    Location L proximal bicep    Dose 1 ml    Time 8  PT Long Term Goals - 03/18/20 1356      PT LONG TERM GOAL #1   Title Patient will be independent with HEP    Time 6    Period Weeks    Status New      PT LONG TERM GOAL #2   Title Patient will demonstrate 140+ degrees of left shoulder flexion AROM to improve ability to perform functional tasks.    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    Time 6    Period Weeks    Status New      PT LONG TERM GOAL #4   Title Patient will report ability to perform ADLs and home activities with left shoulder pain less than or equal to 4/10.    Baseline --    Time 6    Period Weeks    Status New      PT LONG TERM GOAL #5   Title Patient will demonstrate 4/5 or greater left shoulder MMT in all planes to improve stability during functional tasks.    Time 6    Period Weeks    Status New                 Plan - 04/02/20 1213    Clinical Impression  Statement Patient presented in clinic with reports of continued L shoulder pain especially with protraction, flexion, adduction or lifting. Patient did not indicate any discomfort with isometric strengthening. Normal stimulation response noted following removal of the modality.Patient educated regarding iontophoresis patch and understood instruction to remove in four hours.    Personal Factors and Comorbidities Age;Comorbidity 3+;Time since onset of injury/illness/exacerbation    Comorbidities HTN, generalized OA, osteoporosis, bilateral TKA, and R THA    Examination-Activity Limitations Lift;Hygiene/Grooming;Dressing;Caring for Others    Stability/Clinical Decision Making Stable/Uncomplicated    Rehab Potential Good    PT Frequency 2x / week    PT Duration 6 weeks    PT Treatment/Interventions ADLs/Self Care Home Management;Cryotherapy;Electrical Stimulation;Therapeutic activities;Therapeutic exercise;Patient/family education;Neuromuscular re-education;Manual techniques;Passive range of motion;Vasopneumatic Device;Moist Heat;Iontophoresis 4mg /ml Dexamethasone    PT Next Visit Plan Conservative treatment to left shoulder, PROM, then progress to strengthening to tolerance.    PT Home Exercise Plan see patient education section    Consulted and Agree with Plan of Care Patient           Patient will benefit from skilled therapeutic intervention in order to improve the following deficits and impairments:  Pain, Decreased activity tolerance, Decreased strength, Decreased range of motion, Impaired UE functional use, Postural dysfunction  Visit Diagnosis: Chronic left shoulder pain  Muscle weakness (generalized)     Problem List Patient Active Problem List   Diagnosis Date Noted  . Endometrial polyp 06/12/2019  . Edema 12/06/2017  . Vitamin B12 deficiency 12/06/2017  . Essential hypertension 02/19/2017  . Gastroesophageal reflux disease without esophagitis 06/11/2016  . Personal history  of ovarian cyst 06/11/2016  . Age-related osteoporosis without current pathological fracture 06/11/2016  . Bilateral carpal tunnel syndrome 06/09/2016  . Generalized OA 06/09/2016  . Morbid obesity with BMI of 40.0-44.9, adult [facility]) 01/03/2016    Standley Brooking, PTA 04/02/2020, 12:45 PM  [facility] [address] [place], [place], [address] Phone: [phone]  Fax:  [phone]  Name: Melissa Zavala MRN: [id number] Date of Birth: 1940-11-08

## 2020-04-06 ENCOUNTER — Ambulatory Visit: Payer: Medicare HMO | Admitting: Physical Therapy

## 2020-04-06 ENCOUNTER — Other Ambulatory Visit: Payer: Self-pay

## 2020-04-06 DIAGNOSIS — G8929 Other chronic pain: Secondary | ICD-10-CM

## 2020-04-06 DIAGNOSIS — M25512 Pain in left shoulder: Secondary | ICD-10-CM | POA: Diagnosis not present

## 2020-04-06 DIAGNOSIS — M6281 Muscle weakness (generalized): Secondary | ICD-10-CM

## 2020-04-06 NOTE — Therapy (Signed)
[facility] [place], [place], [address] Phone: [phone]   Fax:  [phone]  Physical Therapy Treatment  Patient Details  Name: Melissa Zavala MRN: [id number] Date of Birth: 09-05-1940 Referring Provider (PT): Ronnie Doss, DO   Encounter Date: 04/06/2020   PT End of Session - 04/06/20 1615    Visit Number 6    Number of Visits 12    Date for PT Re-Evaluation 05/06/20    Authorization Type human[employer] Medicare (CQ modifier), Progress note every 10th visit    PT Start Time 0320    PT Stop Time 0418    PT Time Calculation (min) 58 min    Activity Tolerance Patient tolerated treatment well    Behavior During Therapy [facility] for tasks assessed/performed           Past Medical History:  Diagnosis Date   Arthritis    Hypertension     Past Surgical History:  Procedure Laterality Date   TOTAL HIP ARTHROPLASTY Right    TOTAL KNEE ARTHROPLASTY Bilateral    TUBAL LIGATION      There were no vitals filed for this visit.   Subjective Assessment - 04/06/20 1601    Subjective COVID-19 screen performed prior to patient entering clinic.  Not throbbing all the time like it use to.    Pertinent History Arthritis, bilateral TKA's.    Limitations Walking    How long can you walk comfortably? Short community distances.    Patient Stated Goals Have surgery and get out of pain.     Currently in Pain? Yes    Pain Score 6     Pain Location Shoulder    Pain Orientation Left    Pain Descriptors / Indicators Discomfort    Pain Type Chronic pain    Pain Onset More than a month ago                             [facility] Adult PT Treatment/Exercise - 04/06/20 0001      Modalities   Modalities Electrical Stimulation;Iontophoresis;Moist Heat;Ultrasound      Moist Heat Therapy   Number Minutes Moist Heat 20 Minutes    Moist Heat Location --   Left shoulder.     Acupuncturist  Location Left shoulder.    Electrical Stimulation Action IFC    Electrical Stimulation Parameters 80-150 Hz x 20 minutes.    Electrical Stimulation Goals Pain      Ultrasound   Ultrasound Location Left bicipital groove    Ultrasound Parameters Combo e'stim/US (small soundhead) at 1.50 W/CM2 x 12 minutes.    Ultrasound Goals Pain      Iontophoresis   Type of Iontophoresis Dexamethasone    Location Left bicipital groove    Dose 80 mA-Min.    Time 8      Manual Therapy   Manual Therapy Soft tissue mobilization    Soft tissue mobilization STW/M x 10 minutes to patient's left bicipital groove and left UT                        PT Long Term Goals - 03/18/20 1356      PT LONG TERM GOAL #1   Title Patient will be independent with HEP    Time 6    Period Weeks    Status New      PT LONG TERM GOAL #  2   Title Patient will demonstrate 140+ degrees of left shoulder flexion AROM to improve ability to perform functional tasks.    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    Time 6    Period Weeks    Status New      PT LONG TERM GOAL #4   Title Patient will report ability to perform ADLs and home activities with left shoulder pain less than or equal to 4/10.    Baseline --    Time 6    Period Weeks    Status New      PT LONG TERM GOAL #5   Title Patient will demonstrate 4/5 or greater left shoulder MMT in all planes to improve stability during functional tasks.    Time 6    Period Weeks    Status New                 Plan - 04/06/20 1613    Clinical Impression Statement The patient did well with treatment though she is still quite tender to palpation over her left bicipital groove and left UT which has a notable amount of tone.    Personal Factors and Comorbidities Age;Comorbidity 3+;Time since  onset of injury/illness/exacerbation    Comorbidities HTN, generalized OA, osteoporosis, bilateral TKA, and R THA    Examination-Activity Limitations Lift;Hygiene/Grooming;Dressing;Caring for Others    Stability/Clinical Decision Making Stable/Uncomplicated    Rehab Potential Good    PT Frequency 2x / week    PT Duration 6 weeks    PT Treatment/Interventions ADLs/Self Care Home Management;Cryotherapy;Electrical Stimulation;Therapeutic activities;Therapeutic exercise;Patient/family education;Neuromuscular re-education;Manual techniques;Passive range of motion;Vasopneumatic Device;Moist Heat;Iontophoresis 4mg /ml Dexamethasone    PT Next Visit Plan Conservative treatment to left shoulder, PROM, then progress to strengthening to tolerance.    Consulted and Agree with Plan of Care Patient           Patient will benefit from skilled therapeutic intervention in order to improve the following deficits and impairments:  Pain, Decreased activity tolerance, Decreased strength, Decreased range of motion, Impaired UE functional use, Postural dysfunction  Visit Diagnosis: Chronic left shoulder pain  Muscle weakness (generalized)     Problem List Patient Active Problem List   Diagnosis Date Noted   Endometrial polyp 06/12/2019   Edema 12/06/2017   Vitamin B12 deficiency 12/06/2017   Essential hypertension 02/19/2017   Gastroesophageal reflux disease without esophagitis 06/11/2016   Personal history of ovarian cyst 06/11/2016   Age-related osteoporosis without current pathological fracture 06/11/2016   Bilateral carpal tunnel syndrome 06/09/2016   Generalized OA 06/09/2016   Morbid obesity with BMI of 40.0-44.9, adult ([place]) 01/03/2016    Vin Yonke, [place] MPT 04/06/2020, 4:20 PM  [facility] [address] [place], [place], [address] Phone: [phone]   Fax:  [phone]  Name: Melissa Zavala MRN: [id number] Date of Birth:  07/29/1940

## 2020-04-13 ENCOUNTER — Ambulatory Visit: Payer: Medicare HMO | Admitting: Physical Therapy

## 2020-04-13 DIAGNOSIS — R519 Headache, unspecified: Secondary | ICD-10-CM | POA: Diagnosis not present

## 2020-04-13 DIAGNOSIS — R197 Diarrhea, unspecified: Secondary | ICD-10-CM | POA: Diagnosis not present

## 2020-04-13 DIAGNOSIS — J029 Acute pharyngitis, unspecified: Secondary | ICD-10-CM | POA: Diagnosis not present

## 2020-04-13 DIAGNOSIS — R059 Cough, unspecified: Secondary | ICD-10-CM | POA: Diagnosis not present

## 2020-04-13 DIAGNOSIS — R52 Pain, unspecified: Secondary | ICD-10-CM | POA: Diagnosis not present

## 2020-04-13 DIAGNOSIS — R0989 Other specified symptoms and signs involving the circulatory and respiratory systems: Secondary | ICD-10-CM | POA: Diagnosis not present

## 2020-04-15 ENCOUNTER — Ambulatory Visit: Payer: Medicare HMO | Admitting: Nurse Practitioner

## 2020-04-15 ENCOUNTER — Encounter: Payer: Medicare HMO | Admitting: *Deleted

## 2020-04-16 ENCOUNTER — Encounter: Payer: Self-pay | Admitting: Family Medicine

## 2020-04-19 DIAGNOSIS — R059 Cough, unspecified: Secondary | ICD-10-CM | POA: Diagnosis not present

## 2020-04-19 DIAGNOSIS — J329 Chronic sinusitis, unspecified: Secondary | ICD-10-CM | POA: Diagnosis not present

## 2020-04-20 ENCOUNTER — Ambulatory Visit (HOSPITAL_COMMUNITY)
Admission: RE | Admit: 2020-04-20 | Discharge: 2020-04-20 | Disposition: A | Discharge status: Home / Self Care | Payer: Medicare HMO | Source: Ambulatory Visit | Attending: Nurse Practitioner | Admitting: Nurse Practitioner

## 2020-04-20 ENCOUNTER — Other Ambulatory Visit: Payer: Self-pay

## 2020-04-20 DIAGNOSIS — M25512 Pain in left shoulder: Secondary | ICD-10-CM

## 2020-04-20 DIAGNOSIS — S46012A Strain of muscle(s) and tendon(s) of the rotator cuff of left shoulder, initial encounter: Secondary | ICD-10-CM | POA: Diagnosis not present

## 2020-04-21 ENCOUNTER — Ambulatory Visit: Payer: Medicare HMO | Admitting: Physical Therapy

## 2020-04-27 ENCOUNTER — Other Ambulatory Visit: Payer: Self-pay

## 2020-04-27 ENCOUNTER — Ambulatory Visit: Payer: Medicare HMO | Attending: Family Medicine | Admitting: Physical Therapy

## 2020-04-27 DIAGNOSIS — M6281 Muscle weakness (generalized): Secondary | ICD-10-CM | POA: Diagnosis not present

## 2020-04-27 DIAGNOSIS — G8929 Other chronic pain: Secondary | ICD-10-CM | POA: Insufficient documentation

## 2020-04-27 DIAGNOSIS — M25512 Pain in left shoulder: Secondary | ICD-10-CM | POA: Insufficient documentation

## 2020-04-27 NOTE — Therapy (Signed)
[facility] [place], [place], [address] Phone: [phone]   Fax:  [phone]  Physical Therapy Treatment  Patient Details  Name: Melissa Zavala MRN: [id number] Date of Birth: [birth year] Referring Provider (PT): Ronnie Doss, DO   Encounter Date: 04/27/2020   PT End of Session - 04/27/20 1330    Visit Number 7    Number of Visits 12    Date for PT Re-Evaluation 05/06/20    Authorization Type human[employer] Medicare (CQ modifier), Progress note every 10th visit    PT Start Time 0100    PT Stop Time 0150    PT Time Calculation (min) 50 min    Activity Tolerance Patient tolerated treatment well    Behavior During Therapy [facility] for tasks assessed/performed           Past Medical History:  Diagnosis Date   Arthritis    Hypertension     Past Surgical History:  Procedure Laterality Date   TOTAL HIP ARTHROPLASTY Right    TOTAL KNEE ARTHROPLASTY Bilateral    TUBAL LIGATION      There were no vitals filed for this visit.   Subjective Assessment - 04/27/20 1331    Subjective COVID-19 screen performed prior to patient entering clinic.  Doing much better.    Pertinent History Arthritis, bilateral TKA's.    Limitations Walking    How long can you walk comfortably? Short community distances.    Patient Stated Goals Have surgery and get out of pain.     Currently in Pain? Yes    Pain Score 3     Pain Location Shoulder    Pain Orientation Left    Pain Descriptors / Indicators Discomfort    Pain Type Chronic pain    Pain Onset More than a month ago              [facility] PT Assessment - 04/27/20 0001      AROM   Left Shoulder Flexion 160 Degrees    Left Shoulder External Rotation 73 Degrees                         [facility] Adult PT Treatment/Exercise - 04/27/20 0001      Modalities   Modalities Electrical Stimulation;Iontophoresis;Moist Heat;Ultrasound      Moist Heat Therapy   Number Minutes  Moist Heat 20 Minutes    Moist Heat Location --   Left shoulder.     Acupuncturist Location Left shoulder.    Electrical Stimulation Action Pre-mod.    Electrical Stimulation Parameters 80-150 Hz x 20 minutes to patient's left anterior shoulder.    Electrical Stimulation Goals Pain      Ultrasound   Ultrasound Location Left bicipital groove region.    Ultrasound Parameters Combo e'stim/US at 1.50 W/CM2 (small soundhead) x 10 minutes.    Ultrasound Goals Pain      Iontophoresis   Location Left anterior shoulder.    Dose 80 mA-min.    Time 8      Manual Therapy   Manual Therapy Soft tissue mobilization    Soft tissue mobilization STW/M x 5 minutes to patient's left anterior shoulder.                       PT Long Term Goals - 03/18/20 1356      PT LONG TERM GOAL #1   Title Patient will be independent  with HEP    Time 6    Period Weeks    Status New      PT LONG TERM GOAL #2   Title Patient will demonstrate 140+ degrees of left shoulder flexion AROM to improve ability to perform functional tasks.    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    Time 6    Period Weeks    Status New      PT LONG TERM GOAL #4   Title Patient will report ability to perform ADLs and home activities with left shoulder pain less than or equal to 4/10.    Baseline --    Time 6    Period Weeks    Status New      PT LONG TERM GOAL #5   Title Patient will demonstrate 4/5 or greater left shoulder MMT in all planes to improve stability during functional tasks.    Time 6    Period Weeks    Status New                 Plan - 04/27/20 1354    Clinical Impression Statement The patient is doing very well.  Her left shoulder pain has decreased significantly and her antigravity left  shoulder flexion and ER has improved a great deal.    Personal Factors and Comorbidities Age;Comorbidity 3+;Time since onset of injury/illness/exacerbation    Comorbidities HTN, generalized OA, osteoporosis, bilateral TKA, and R THA    Examination-Activity Limitations Lift;Hygiene/Grooming;Dressing;Caring for Others    Stability/Clinical Decision Making Stable/Uncomplicated    Rehab Potential Good    PT Frequency 2x / week    PT Duration 6 weeks    PT Treatment/Interventions ADLs/Self Care Home Management;Cryotherapy;Electrical Stimulation;Therapeutic activities;Therapeutic exercise;Patient/family education;Neuromuscular re-education;Manual techniques;Passive range of motion;Vasopneumatic Device;Moist Heat;Iontophoresis 4mg /ml Dexamethasone    PT Next Visit Plan Conservative treatment to left shoulder, PROM, then progress to strengthening to tolerance.    Consulted and Agree with Plan of Care Patient           Patient will benefit from skilled therapeutic intervention in order to improve the following deficits and impairments:  Pain,Decreased activity tolerance,Decreased strength,Decreased range of motion,Impaired UE functional use,Postural dysfunction  Visit Diagnosis: Chronic left shoulder pain  Muscle weakness (generalized)     Problem List Patient Active Problem List   Diagnosis Date Noted   Endometrial polyp 06/12/2019   Edema 12/06/2017   Vitamin B12 deficiency 12/06/2017   Essential hypertension 02/19/2017   Gastroesophageal reflux disease without esophagitis 06/11/2016   Personal history of ovarian cyst 06/11/2016   Age-related osteoporosis without current pathological fracture 06/11/2016   Bilateral carpal tunnel syndrome 06/09/2016   Generalized OA 06/09/2016   Morbid obesity with BMI of 40.0-44.9, adult ([place]) 01/03/2016    Melissa Zavala, [place] MPT 04/27/2020, 2:08 PM  [facility] [address] [place],  [place], [address] Phone: [phone]   Fax:  [phone]  Name: Melissa Zavala MRN: [id number] Date of Birth: March 24, 1941

## 2020-04-29 ENCOUNTER — Other Ambulatory Visit: Payer: Self-pay

## 2020-04-29 ENCOUNTER — Ambulatory Visit: Payer: Medicare HMO | Admitting: *Deleted

## 2020-04-29 DIAGNOSIS — M6281 Muscle weakness (generalized): Secondary | ICD-10-CM

## 2020-04-29 DIAGNOSIS — G8929 Other chronic pain: Secondary | ICD-10-CM | POA: Diagnosis not present

## 2020-04-29 DIAGNOSIS — M25512 Pain in left shoulder: Secondary | ICD-10-CM

## 2020-04-29 NOTE — Therapy (Signed)
[facility] [place], [place], [address] Phone: [phone]   Fax:  [phone]  Physical Therapy Treatment  Patient Details  Name: Melissa Zavala MRN: [id number] Date of Birth: [birth year] Referring Provider (PT): Ronnie Doss, DO   Encounter Date: 04/29/2020   PT End of Session - 04/29/20 1850    Visit Number 8    Number of Visits 12    Date for PT Re-Evaluation 05/06/20    Authorization Type humana Medicare (CQ modifier), Progress note every 10th visit    PT Start Time 1300    PT Stop Time 1349    PT Time Calculation (min) 49 min           Past Medical History:  Diagnosis Date  . Arthritis   . Hypertension     Past Surgical History:  Procedure Laterality Date  . TOTAL HIP ARTHROPLASTY Right   . TOTAL KNEE ARTHROPLASTY Bilateral   . TUBAL LIGATION      There were no vitals filed for this visit.   Subjective Assessment - 04/29/20 1302    Subjective COVID-19 screen performed prior to patient entering clinic.  Doing much better. LT shldr 2/10. MRI a week ago on LT shldr    Pertinent History Arthritis, bilateral TKA's.    Limitations Walking    How long can you walk comfortably? Short community distances.    Patient Stated Goals Have surgery and get out of pain.     Currently in Pain? Yes    Pain Score 2     Pain Location Shoulder    Pain Orientation Left    Pain Descriptors / Indicators Discomfort                             [facility] Adult PT Treatment/Exercise - 04/29/20 0001      Modalities   Modalities Electrical Stimulation;Iontophoresis;Moist Heat;Ultrasound      Moist Heat Therapy   Number Minutes Moist Heat 15 Minutes    Moist Heat Location Shoulder   Left shoulder.     Acupuncturist Location Left shoulder.    Electrical Stimulation Action premod    Electrical Stimulation Parameters 80-150hz  x15 min    Electrical Stimulation Goals  Pain      Ultrasound   Ultrasound Location LT bicipital groove    Ultrasound Parameters combo [place] 1.5 w/cm2 x 10 mins    Ultrasound Goals Pain      Iontophoresis   Type of Iontophoresis Dexamethasone    Location Left anterior shoulder.    Dose 80 mA-min.    Time 8      Manual Therapy   Manual Therapy Soft tissue mobilization    Soft tissue mobilization STW/IASTM x 6 minutes to patient's left anteriolateral shoulder.                       PT Long Term Goals - 03/18/20 1356      PT LONG TERM GOAL #1   Title Patient will be independent with HEP    Time 6    Period Weeks    Status New      PT LONG TERM GOAL #2   Title Patient will demonstrate 140+ degrees of left shoulder flexion AROM to improve ability to perform functional tasks.    Baseline --    Time 6    Period Weeks    Status New  PT LONG TERM GOAL #3   Title Patient will demonstrate functional overhead left shoulder ER to C6 or lower to improve ability to perform hair care.    Time 6    Period Weeks    Status New      PT LONG TERM GOAL #4   Title Patient will report ability to perform ADLs and home activities with left shoulder pain less than or equal to 4/10.    Baseline --    Time 6    Period Weeks    Status New      PT LONG TERM GOAL #5   Title Patient will demonstrate 4/5 or greater left shoulder MMT in all planes to improve stability during functional tasks.    Time 6    Period Weeks    Status New                 Plan - 04/29/20 1846    Clinical Impression Statement Pt arrived today reporting that her shldr is doing better with less pain now when she raises it.Pt able to raise LT UE above shldr level now without pain. US/ combo, STW, and estim tolerated very well.    Personal Factors and Comorbidities Age;Comorbidity 3+;Time since onset of injury/illness/exacerbation    Comorbidities HTN, generalized OA, osteoporosis, bilateral TKA, and R THA    Examination-Activity Limitations  Lift;Hygiene/Grooming;Dressing;Caring for Others    Stability/Clinical Decision Making Stable/Uncomplicated    PT Treatment/Interventions ADLs/Self Care Home Management;Cryotherapy;Electrical Stimulation;Therapeutic activities;Therapeutic exercise;Patient/family education;Neuromuscular re-education;Manual techniques;Passive range of motion;Vasopneumatic Device;Moist Heat;Iontophoresis 4mg /ml Dexamethasone    PT Next Visit Plan Conservative treatment to left shoulder, PROM, then progress to strengthening to tolerance.    Consulted and Agree with Plan of Care Patient           Patient will benefit from skilled therapeutic intervention in order to improve the following deficits and impairments:  Pain,Decreased activity tolerance,Decreased strength,Decreased range of motion,Impaired UE functional use,Postural dysfunction  Visit Diagnosis: Chronic left shoulder pain  Muscle weakness (generalized)     Problem List Patient Active Problem List   Diagnosis Date Noted  . Endometrial polyp 06/12/2019  . Edema 12/06/2017  . Vitamin B12 deficiency 12/06/2017  . Essential hypertension 02/19/2017  . Gastroesophageal reflux disease without esophagitis 06/11/2016  . Personal history of ovarian cyst 06/11/2016  . Age-related osteoporosis without current pathological fracture 06/11/2016  . Bilateral carpal tunnel syndrome 06/09/2016  . Generalized OA 06/09/2016  . Morbid obesity with BMI of 40.0-44.9, adult ([place]) 01/03/2016    Larin Weissberg,CHRIS, PTA 04/29/2020, 6:51 PM  [facility] [place], [place], [address] Phone: [phone]   Fax:  [phone]  Name: Melissa Zavala MRN: [id number] Date of Birth: 1941-05-07

## 2020-05-04 ENCOUNTER — Ambulatory Visit: Payer: Medicare HMO | Admitting: Physical Therapy

## 2020-05-06 ENCOUNTER — Other Ambulatory Visit: Payer: Self-pay

## 2020-05-06 ENCOUNTER — Ambulatory Visit: Payer: Medicare HMO | Admitting: Physical Therapy

## 2020-05-06 DIAGNOSIS — M6281 Muscle weakness (generalized): Secondary | ICD-10-CM | POA: Diagnosis not present

## 2020-05-06 DIAGNOSIS — M25512 Pain in left shoulder: Secondary | ICD-10-CM | POA: Diagnosis not present

## 2020-05-06 DIAGNOSIS — G8929 Other chronic pain: Secondary | ICD-10-CM

## 2020-05-06 NOTE — Therapy (Signed)
[facility] [place], [place], [address] Phone: [phone]   Fax:  [phone]  Physical Therapy Treatment  Patient Details  Name: Melissa Zavala MRN: [id number] Date of Birth: 1941-04-14 Referring Provider (PT): Ronnie Doss, DO   Encounter Date: 05/06/2020   PT End of Session - 05/06/20 1320    Visit Number 9    Number of Visits 12    PT Start Time 0100    PT Stop Time 0151    PT Time Calculation (min) 51 min    Activity Tolerance Patient tolerated treatment well    Behavior During Therapy [facility] for tasks assessed/performed           Past Medical History:  Diagnosis Date  . Arthritis   . Hypertension     Past Surgical History:  Procedure Laterality Date  . TOTAL HIP ARTHROPLASTY Right   . TOTAL KNEE ARTHROPLASTY Bilateral   . TUBAL LIGATION      There were no vitals filed for this visit.   Subjective Assessment - 05/06/20 1335    Subjective COVID-19 screen performed prior to patient entering clinic.  Doing much better.    Pertinent History Arthritis, bilateral TKA's.    Limitations Walking    How long can you walk comfortably? Short community distances.    Patient Stated Goals Have surgery and get out of pain.     Currently in Pain? Yes    Pain Score 2     Pain Location Shoulder    Pain Orientation Left    Pain Descriptors / Indicators Discomfort    Pain Type Chronic pain                             [facility] Adult PT Treatment/Exercise - 05/06/20 0001      Exercises   Exercises Shoulder;Knee/Hip      Knee/Hip Exercises: Aerobic   Nustep Level 4 for UE exercise with patient assisiting with bilateral LE's x 15 minutes.      Knee/Hip Exercises: Standing   Other Standing Knee Exercises Wall ladder x 4 minutes.      Shoulder Exercises: Seated   Other Seated Exercises Pulleys x 4 minutes.      Modalities   Modalities Electrical Stimulation;Moist Heat      Moist Heat  Therapy   Number Minutes Moist Heat 20 Minutes    Moist Heat Location --   Left shoulder.     Acupuncturist Location Left shoulder.    Electrical Stimulation Action Pre-mod.    Electrical Stimulation Parameters 80-150 Hz x 20 minutes.    Electrical Stimulation Goals Pain                       PT Long Term Goals - 03/18/20 1356      PT LONG TERM GOAL #1   Title Patient will be independent with HEP    Time 6    Period Weeks    Status New      PT LONG TERM GOAL #2   Title Patient will demonstrate 140+ degrees of left shoulder flexion AROM to improve ability to perform functional tasks.    Baseline --    Time 6    Period Weeks    Status New      PT LONG TERM GOAL #3   Title Patient will demonstrate functional overhead left shoulder ER to C6  or lower to improve ability to perform hair care.    Time 6    Period Weeks    Status New      PT LONG TERM GOAL #4   Title Patient will report ability to perform ADLs and home activities with left shoulder pain less than or equal to 4/10.    Baseline --    Time 6    Period Weeks    Status New      PT LONG TERM GOAL #5   Title Patient will demonstrate 4/5 or greater left shoulder MMT in all planes to improve stability during functional tasks.    Time 6    Period Weeks    Status New                 Plan - 05/06/20 1343    Clinical Impression Statement Excellent progress.  Low left shoulder pain and equal bilateral antigravity elevation.    Personal Factors and Comorbidities Age;Comorbidity 3+;Time since onset of injury/illness/exacerbation    Comorbidities HTN, generalized OA, osteoporosis, bilateral TKA, and R THA    Examination-Activity Limitations Lift;Hygiene/Grooming;Dressing;Caring for Others    Stability/Clinical Decision Making Stable/Uncomplicated    Rehab Potential Good    PT Frequency 2x / week    PT Treatment/Interventions ADLs/Self Care Home  Management;Cryotherapy;Electrical Stimulation;Therapeutic activities;Therapeutic exercise;Patient/family education;Neuromuscular re-education;Manual techniques;Passive range of motion;Vasopneumatic Device;Moist Heat;Iontophoresis 4mg /ml Dexamethasone    PT Next Visit Plan Conservative treatment to left shoulder, PROM, then progress to strengthening to tolerance.           Patient will benefit from skilled therapeutic intervention in order to improve the following deficits and impairments:  Pain,Decreased activity tolerance,Decreased strength,Decreased range of motion,Impaired UE functional use,Postural dysfunction  Visit Diagnosis: Chronic left shoulder pain  Muscle weakness (generalized)     Problem List Patient Active Problem List   Diagnosis Date Noted  . Endometrial polyp 06/12/2019  . Edema 12/06/2017  . Vitamin B12 deficiency 12/06/2017  . Essential hypertension 02/19/2017  . Gastroesophageal reflux disease without esophagitis 06/11/2016  . Personal history of ovarian cyst 06/11/2016  . Age-related osteoporosis without current pathological fracture 06/11/2016  . Bilateral carpal tunnel syndrome 06/09/2016  . Generalized OA 06/09/2016  . Morbid obesity with BMI of 40.0-44.9, adult ([facility]) 01/03/2016    Jovanka Westgate, 01/05/2016 MPT 05/06/2020, 1:53 PM  [facility] [address] [place], [place], [place] Phone: [phone]   Fax:  [phone]  Name: Melissa Zavala MRN: [name] Date of Birth: 1940/09/19

## 2020-05-11 ENCOUNTER — Other Ambulatory Visit: Payer: Self-pay

## 2020-05-11 ENCOUNTER — Ambulatory Visit: Payer: Medicare HMO | Admitting: *Deleted

## 2020-05-11 DIAGNOSIS — M6281 Muscle weakness (generalized): Secondary | ICD-10-CM | POA: Diagnosis not present

## 2020-05-11 DIAGNOSIS — G8929 Other chronic pain: Secondary | ICD-10-CM | POA: Diagnosis not present

## 2020-05-11 DIAGNOSIS — M25512 Pain in left shoulder: Secondary | ICD-10-CM | POA: Diagnosis not present

## 2020-05-11 NOTE — Therapy (Signed)
[facility] [address] [place], [place], [address] Phone: [phone]   Fax:  [phone]  Physical Therapy Treatment  Patient Details  Name: Melissa Zavala MRN: [id number] Date of Birth: [birth year] Referring Provider (PT): Delynn Flavin, DO   Encounter Date: 05/11/2020   PT End of Session - 05/11/20 1348    Visit Number 10    Number of Visits 12    Date for PT Re-Evaluation 05/06/20    Authorization Type human[employer] Medicare (CQ modifier), Progress note every 10th visit    PT Start Time 1300    PT Stop Time 1350    PT Time Calculation (min) 50 min           Past Medical History:  Diagnosis Date  . Arthritis   . Hypertension     Past Surgical History:  Procedure Laterality Date  . TOTAL HIP ARTHROPLASTY Right   . TOTAL KNEE ARTHROPLASTY Bilateral   . TUBAL LIGATION      There were no vitals filed for this visit.                      [facility] Adult PT Treatment/Exercise - 05/11/20 0001      Exercises   Exercises Shoulder;Knee/Hip      Knee/Hip Exercises: Aerobic   Nustep Level 4 for UE exercise with patient assisiting with bilateral LE's x 10 minutes.      Shoulder Exercises: Seated   Other Seated Exercises Pulleys x 5 minutes.      Modalities   Modalities Electrical Stimulation;Moist Heat      Moist Heat Therapy   Number Minutes Moist Heat 15 Minutes    Moist Heat Location Shoulder      Electrical Stimulation   Electrical Stimulation Location Left shoulder.    Electrical Stimulation Action premod    Electrical Stimulation Parameters 80-150hz  x 15 mins    Electrical Stimulation Goals Pain      Manual Therapy   Manual Therapy Passive ROM    Passive ROM AAROM/ AROM for elevation  3x10, ER/IR 3x10 with manual resistance                       PT Long Term Goals - 03/18/20 1356      PT LONG TERM GOAL #1   Title Patient will be independent with HEP    Time 6    Period Weeks     Status New      PT LONG TERM GOAL #2   Title Patient will demonstrate 140+ degrees of left shoulder flexion AROM to improve ability to perform functional tasks.    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    Time 6    Period Weeks    Status New      PT LONG TERM GOAL #4   Title Patient will report ability to perform ADLs and home activities with left shoulder pain less than or equal to 4/10.    Baseline --    Time 6    Period Weeks    Status New      PT LONG TERM GOAL #5   Title Patient will demonstrate 4/5 or greater left shoulder MMT in all  planes to improve stability during functional tasks.    Time 6    Period Weeks    Status New                 Plan - 05/11/20 1350    Clinical Impression Statement Pt arrived today doing fairly well with mainly soreness and minimal pain LT shldr and hip. She was able to perform AROM as well as manual resistance  for IR/ER. Pt did well after heat and estim.    Personal Factors and Comorbidities Age;Comorbidity 3+;Time since onset of injury/illness/exacerbation    Comorbidities HTN, generalized OA, osteoporosis, bilateral TKA, and R THA    Examination-Activity Limitations Lift;Hygiene/Grooming;Dressing;Caring for Others    Stability/Clinical Decision Making Stable/Uncomplicated    Rehab Potential Good    PT Frequency 2x / week    PT Duration 6 weeks    PT Treatment/Interventions ADLs/Self Care Home Management;Cryotherapy;Electrical Stimulation;Therapeutic activities;Therapeutic exercise;Patient/family education;Neuromuscular re-education;Manual techniques;Passive range of motion;Vasopneumatic Device;Moist Heat;Iontophoresis 4mg /ml Dexamethasone    PT Next Visit Plan Conservative treatment to left shoulder, PROM, then progress to strengthening to tolerance.    PT Home  Exercise Plan see patient education section    Consulted and Agree with Plan of Care Patient           Patient will benefit from skilled therapeutic intervention in order to improve the following deficits and impairments:  Pain,Decreased activity tolerance,Decreased strength,Decreased range of motion,Impaired UE functional use,Postural dysfunction  Visit Diagnosis: Chronic left shoulder pain  Muscle weakness (generalized)     Problem List Patient Active Problem List   Diagnosis Date Noted  . Endometrial polyp 06/12/2019  . Edema 12/06/2017  . Vitamin B12 deficiency 12/06/2017  . Essential hypertension 02/19/2017  . Gastroesophageal reflux disease without esophagitis 06/11/2016  . Personal history of ovarian cyst 06/11/2016  . Age-related osteoporosis without current pathological fracture 06/11/2016  . Bilateral carpal tunnel syndrome 06/09/2016  . Generalized OA 06/09/2016  . Morbid obesity with BMI of 40.0-44.9, adult (HCC) 01/03/2016    Latrenda Irani,CHRIS, PTA 05/11/2020, 2:01 PM  [facility] [address] [place], [place], [place] Phone: [phone]   Fax:  [phone]  Name: Melissa Zavala MRN: [name] Date of Birth: [birth year]

## 2020-05-13 ENCOUNTER — Ambulatory Visit: Payer: Medicare HMO | Admitting: *Deleted

## 2020-05-13 ENCOUNTER — Other Ambulatory Visit: Payer: Self-pay

## 2020-05-13 DIAGNOSIS — M6281 Muscle weakness (generalized): Secondary | ICD-10-CM

## 2020-05-13 DIAGNOSIS — M25512 Pain in left shoulder: Secondary | ICD-10-CM | POA: Diagnosis not present

## 2020-05-13 DIAGNOSIS — G8929 Other chronic pain: Secondary | ICD-10-CM | POA: Diagnosis not present

## 2020-05-13 NOTE — Therapy (Signed)
[facility] [place], [place], [address] Phone: [phone]   Fax:  [phone]  Physical Therapy Treatment  Patient Details  Name: Melissa Zavala MRN: [id number] Date of Birth: 14-Apr-1941 Referring Provider (PT): Ronnie Doss, DO   Encounter Date: 05/13/2020   PT End of Session - 05/13/20 1307    Visit Number 11    Number of Visits 12    Date for PT Re-Evaluation 05/06/20    Authorization Type human[employer] Medicare (CQ modifier), Progress note every 10th visit    PT Start Time 1300    PT Stop Time 1350    PT Time Calculation (min) 50 min           Past Medical History:  Diagnosis Date  . Arthritis   . Hypertension     Past Surgical History:  Procedure Laterality Date  . TOTAL HIP ARTHROPLASTY Right   . TOTAL KNEE ARTHROPLASTY Bilateral   . TUBAL LIGATION      There were no vitals filed for this visit.   Subjective Assessment - 05/13/20 1305    Subjective COVID-19 screen performed prior to patient entering clinic.  Doing much better still. Did good after last Rx    Pertinent History Arthritis, bilateral TKA's.    Limitations Walking    How long can you walk comfortably? Short community distances.    Patient Stated Goals Have surgery and get out of pain.     Currently in Pain? Yes    Pain Score 2     Pain Location Shoulder    Pain Orientation Left    Pain Descriptors / Indicators Discomfort    Pain Type Chronic pain    Pain Onset More than a month ago                             [facility] Adult PT Treatment/Exercise - 05/13/20 0001      Exercises   Exercises Shoulder;Knee/Hip      Knee/Hip Exercises: Aerobic   Nustep Level 4 for UE exercise with patient assisiting with bilateral LE's x 10 minutes.      Shoulder Exercises: Seated   Other Seated Exercises Pulleys x 5 minutes.      Shoulder Exercises: Standing   External Rotation Strengthening;Left;Theraband   2x10   Theraband  Level (Shoulder External Rotation) Level 1 (Yellow)    Internal Rotation Strengthening;Left   2x10   Theraband Level (Shoulder Internal Rotation) Level 1 (Yellow)    Extension Strengthening;Left;10 reps   2x10   Theraband Level (Shoulder Extension) Level 1 (Yellow)    Row Strengthening;Left;10 reps   2x10     Modalities   Modalities Electrical Stimulation;Moist Heat      Moist Heat Therapy   Number Minutes Moist Heat 15 Minutes    Moist Heat Location Shoulder      Electrical Stimulation   Electrical Stimulation Location Left shoulder.    Electrical Stimulation Action premod    Electrical Stimulation Parameters 80-150hz  x 15 mins    Electrical Stimulation Goals Pain      Manual Therapy   Manual Therapy Passive ROM    Passive ROM AAROM/ AROM for elevation  3x10, ER/IR 3x10 with manual resistance                       PT Long Term Goals - 03/18/20 1356      PT LONG TERM GOAL #1  Title Patient will be independent with HEP    Time 6    Period Weeks    Status New      PT LONG TERM GOAL #2   Title Patient will demonstrate 140+ degrees of left shoulder flexion AROM to improve ability to perform functional tasks.    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    Time 6    Period Weeks    Status New      PT LONG TERM GOAL #4   Title Patient will report ability to perform ADLs and home activities with left shoulder pain less than or equal to 4/10.    Baseline --    Time 6    Period Weeks    Status New      PT LONG TERM GOAL #5   Title Patient will demonstrate 4/5 or greater left shoulder MMT in all planes to improve stability during functional tasks.    Time 6    Period Weeks    Status New                 Plan - 05/13/20 1308    Clinical Impression Statement Pt  did well with  AROM/  AAROM therex today and light strengthening with yellow tband. Pt has 1 visit left and then DC. If Pt did well  add tband exs to HEP. AROM flexion to 140 degrees today    Personal Factors and Comorbidities Age;Comorbidity 3+;Time since onset of injury/illness/exacerbation    Comorbidities HTN, generalized OA, osteoporosis, bilateral TKA, and R THA    Examination-Activity Limitations Lift;Hygiene/Grooming;Dressing;Caring for Others    Stability/Clinical Decision Making Stable/Uncomplicated    Rehab Potential Good    PT Frequency 2x / week    PT Duration 6 weeks    PT Treatment/Interventions ADLs/Self Care Home Management;Cryotherapy;Electrical Stimulation;Therapeutic activities;Therapeutic exercise;Patient/family education;Neuromuscular re-education;Manual techniques;Passive range of motion;Vasopneumatic Device;Moist Heat;Iontophoresis 4mg /ml Dexamethasone    PT Next Visit Plan DC to HEP after next visit           Patient will benefit from skilled therapeutic intervention in order to improve the following deficits and impairments:  Pain,Decreased activity tolerance,Decreased strength,Decreased range of motion,Impaired UE functional use,Postural dysfunction  Visit Diagnosis: Chronic left shoulder pain  Muscle weakness (generalized)     Problem List Patient Active Problem List   Diagnosis Date Noted  . Endometrial polyp 06/12/2019  . Edema 12/06/2017  . Vitamin B12 deficiency 12/06/2017  . Essential hypertension 02/19/2017  . Gastroesophageal reflux disease without esophagitis 06/11/2016  . Personal history of ovarian cyst 06/11/2016  . Age-related osteoporosis without current pathological fracture 06/11/2016  . Bilateral carpal tunnel syndrome 06/09/2016  . Generalized OA 06/09/2016  . Morbid obesity with BMI of 40.0-44.9, adult (HCC) 01/03/2016    Britiney Blahnik,CHRIS, PTA 05/13/2020, 1:50 PM  [facility] [address] [place],  [place], [place] Phone: [phone]   Fax:  [phone]  Name: Melissa Zavala MRN: [name] Date of Birth: Sep 12, 1940

## 2020-05-19 DIAGNOSIS — R300 Dysuria: Secondary | ICD-10-CM | POA: Diagnosis not present

## 2020-05-20 ENCOUNTER — Other Ambulatory Visit: Payer: Self-pay

## 2020-05-20 ENCOUNTER — Ambulatory Visit: Payer: Medicare Other | Attending: Family Medicine | Admitting: Physical Therapy

## 2020-05-20 DIAGNOSIS — M6281 Muscle weakness (generalized): Secondary | ICD-10-CM | POA: Insufficient documentation

## 2020-05-20 DIAGNOSIS — M25512 Pain in left shoulder: Secondary | ICD-10-CM | POA: Diagnosis not present

## 2020-05-20 DIAGNOSIS — G8929 Other chronic pain: Secondary | ICD-10-CM | POA: Diagnosis not present

## 2020-05-20 NOTE — Therapy (Signed)
[facility] [place], [place], [address] Phone: [phone]   Fax:  [phone]  Physical Therapy Treatment  Patient Details  Name: Melissa Zavala MRN: [id number] Date of Birth: [birth year] Referring Provider (PT): Ronnie Doss, DO   Encounter Date: 05/20/2020   PT End of Session - 05/20/20 1432    Visit Number 12    Number of Visits 12    Date for PT Re-Evaluation 05/20/20    Authorization Type humana Medicare (CQ modifier), Progress note every 10th visit    PT Start Time 0102    PT Stop Time 0153    PT Time Calculation (min) 51 min    Activity Tolerance Patient tolerated treatment well    Behavior During Therapy [facility] for tasks assessed/performed           Past Medical History:  Diagnosis Date  . Arthritis   . Hypertension     Past Surgical History:  Procedure Laterality Date  . TOTAL HIP ARTHROPLASTY Right   . TOTAL KNEE ARTHROPLASTY Bilateral   . TUBAL LIGATION      There were no vitals filed for this visit.   Subjective Assessment - 05/20/20 1406    Subjective COVID-19 screen performed prior to patient entering clinic.  Today is patients last day.  She is very happy with her outcome.    Pertinent History Arthritis, bilateral TKA's.    Limitations Walking    How long can you walk comfortably? Short community distances.    Patient Stated Goals Have surgery and get out of pain.     Currently in Pain? Yes    Pain Score 1     Pain Location Shoulder    Pain Orientation Left    Pain Descriptors / Indicators Discomfort    Pain Type Chronic pain    Pain Onset More than a month ago                             [facility] Adult PT Treatment/Exercise - 05/20/20 0001      Exercises   Exercises Knee/Hip;Shoulder      Knee/Hip Exercises: Aerobic   Nustep Level for UE's with LE's assisting x 15 minutes.      Knee/Hip Exercises: Standing   Other Standing Knee Exercises RW4 with yellow  theraband to fatigue.      Shoulder Exercises: Seated   Other Seated Exercises Seated UE Ranger x 3 minutes.    Other Seated Exercises Seated left shoulder full can to fatigue.      Modalities   Modalities Electrical Stimulation;Moist Heat      Moist Heat Therapy   Number Minutes Moist Heat 20 Minutes    Moist Heat Location --   Left shoulder.     Acupuncturist Location Left shoulder.    Electrical Stimulation Action Pre-mod.    Electrical Stimulation Parameters 80-150 Hz x 20 minutes.    Electrical Stimulation Goals Pain                  PT Education - 05/20/20 1431    Education Details RW4 with yellow theraband.  Theraband provided for home use.    Person(s) Educated Patient    Methods Explanation;Demonstration    Comprehension Verbalized understanding;Returned demonstration               PT Long Term Goals - 05/20/20 1408      PT LONG  TERM GOAL #1   Title Patient will be independent with HEP    Time 6    Period Weeks    Status Achieved      PT LONG TERM GOAL #2   Title Patient will demonstrate 140+ degrees of left shoulder flexion AROM to improve ability to perform functional tasks.    Time 6    Period Weeks    Status Achieved      PT LONG TERM GOAL #3   Title Patient will demonstrate functional overhead left shoulder ER to C6 or lower to improve ability to perform hair care.    Time 6    Period Weeks    Status Achieved      PT LONG TERM GOAL #4   Title Patient will report ability to perform ADLs and home activities with left shoulder pain less than or equal to 4/10.    Time 6    Period Weeks    Status Achieved      PT LONG TERM GOAL #5   Title Patient will demonstrate 4/5 or greater left shoulder MMT in all planes to improve stability during functional tasks.    Baseline 4- to 4/5.    Time 6    Period Weeks    Status Partially Met                 Plan - 05/20/20 1433    Clinical Impression  Statement See "Discharge Summary."    Personal Factors and Comorbidities Age;Comorbidity 3+;Time since onset of injury/illness/exacerbation    Comorbidities HTN, generalized OA, osteoporosis, bilateral TKA, and R THA    Examination-Activity Limitations Lift;Hygiene/Grooming;Dressing;Caring for Others    Rehab Potential Good    PT Frequency 2x / week    PT Duration 6 weeks    PT Treatment/Interventions ADLs/Self Care Home Management;Cryotherapy;Electrical Stimulation;Therapeutic activities;Therapeutic exercise;Patient/family education;Neuromuscular re-education;Manual techniques;Passive range of motion;Vasopneumatic Device;Moist Heat;Iontophoresis 35m/ml Dexamethasone    PT Next Visit Plan DC to HEP after next visit    PT Home Exercise Plan see patient education section    Consulted and Agree with Plan of Care Patient           Patient will benefit from skilled therapeutic intervention in order to improve the following deficits and impairments:  Pain,Decreased activity tolerance,Decreased strength,Decreased range of motion,Impaired UE functional use,Postural dysfunction  Visit Diagnosis: Chronic left shoulder pain  Muscle weakness (generalized)     Problem List Patient Active Problem List   Diagnosis Date Noted  . Endometrial polyp 06/12/2019  . Edema 12/06/2017  . Vitamin B12 deficiency 12/06/2017  . Essential hypertension 02/19/2017  . Gastroesophageal reflux disease without esophagitis 06/11/2016  . Personal history of ovarian cyst 06/11/2016  . Age-related osteoporosis without current pathological fracture 06/11/2016  . Bilateral carpal tunnel syndrome 06/09/2016  . Generalized OA 06/09/2016  . Morbid obesity with BMI of 40.0-44.9, [place] 01/03/2016   PHYSICAL THERAPY DISCHARGE SUMMARY  Visits from Start of Care: 12.  Current functional level related to goals / functional outcomes: See above.   Remaining deficits: See goal section.   Education /  Equipment: HEP. Plan: Patient agrees to discharge.  Patient goals were partially met. Patient is being discharged due to being pleased with the current functional level.  ?????      Issaih Kaus, CMaliMPT 05/20/2020, 2:35 PM  [facility] [place] [address]Phone: [phone]  Fax:  [phone] Name: Melissa MCVICKERMRN: [id number] of  Birth: 1940-09-13

## 2020-05-21 DIAGNOSIS — R0781 Pleurodynia: Secondary | ICD-10-CM | POA: Diagnosis not present

## 2020-05-21 DIAGNOSIS — W19XXXA Unspecified fall, initial encounter: Secondary | ICD-10-CM | POA: Diagnosis not present

## 2020-05-24 ENCOUNTER — Other Ambulatory Visit: Payer: Self-pay

## 2020-05-24 ENCOUNTER — Ambulatory Visit (INDEPENDENT_AMBULATORY_CARE_PROVIDER_SITE_OTHER): Payer: Medicare Other | Admitting: Family Medicine

## 2020-05-24 ENCOUNTER — Encounter: Payer: Self-pay | Admitting: Family Medicine

## 2020-05-24 VITALS — BP 167/84 | HR 84 | Temp 99.3°F | Ht 61.0 in | Wt 209.2 lb

## 2020-05-24 DIAGNOSIS — R0781 Pleurodynia: Secondary | ICD-10-CM

## 2020-05-24 DIAGNOSIS — I10 Essential (primary) hypertension: Secondary | ICD-10-CM | POA: Diagnosis not present

## 2020-05-24 DIAGNOSIS — W19XXXS Unspecified fall, sequela: Secondary | ICD-10-CM | POA: Diagnosis not present

## 2020-05-24 MED ORDER — PREDNISONE 20 MG PO TABS: ORAL_TABLET | ORAL | 0 refills | Status: DC

## 2020-05-24 MED ORDER — AMLODIPINE BESYLATE 5 MG PO TABS: 5.0000 mg | ORAL_TABLET | Freq: Every day | ORAL | 3 refills | Status: DC

## 2020-05-24 NOTE — Progress Notes (Signed)
Acute Office Visit  Subjective:    Patient ID: Melissa Zavala, female    DOB: 1940-11-05, 80 y.o.   MRN: [id number]  Chief Complaint  Patient presents with  . Fall    HPI Patient is in today for fall. Melissa Zavala tripped on a rug in her house last week and landed on her right side. She has had right sided rib pain since. She was seen at a UC with [facility] and had an Xray done. She reports that they told her that her ribs were not broken. The pain on her right ribs has gotten better since the fall. The pain is now a 6/10. The pain is intermittent and sharp. It is worse with a deep breath or coughing. She has taken tramadol and tylenol with some improvement. She denies new shortness of breath, left sided chest pain, pain in her left arm.shoulder, jaw, or neck. Denies nausea, diaphoresis. She has peripheral edema at base line. Denies cough. Her BP at home has been elevated for the last few weeks with systolic readings around 333-545G. She does report headaches.   Past Medical History:  Diagnosis Date  . Arthritis   . Hypertension     Past Surgical History:  Procedure Laterality Date  . TOTAL HIP ARTHROPLASTY Right   . TOTAL KNEE ARTHROPLASTY Bilateral   . TUBAL LIGATION      Family History  Problem Relation Age of Onset  . Breast cancer Mother     Social History   Socioeconomic History  . Marital status: Legally Separated    Spouse name: Not on file  . Number of children: 7  . Years of education: Not on file  . Highest education level: 8th grade  Occupational History  . Not on file  Tobacco Use  . Smoking status: Never Smoker  . Smokeless tobacco: Never Used  Vaping Use  . Vaping Use: Never used  Substance and Sexual Activity  . Alcohol use: No  . Drug use: No  . Sexual activity: Not Currently  Other Topics Concern  . Not on file  Social History Narrative  . Not on file   Social Determinants of Health   Financial Resource Strain: Not on file  Food Insecurity: Not on  file  Transportation Needs: Not on file  Physical Activity: Not on file  Stress: Not on file  Social Connections: Not on file  Intimate Partner Violence: Not on file    Outpatient Medications Prior to Visit  Medication Sig Dispense Refill  . celecoxib (CELEBREX) 200 MG capsule TAKE (1) CAPSULE DAILY. 30 capsule 5  . esomeprazole (NEXIUM) 40 MG capsule TAKE (1) CAPSULE DAILY. 30 capsule 5  . furosemide (LASIX) 20 MG tablet Take 2 tablets (40 mg total) by mouth daily. 60 tablet 5  . lisinopril (ZESTRIL) 40 MG tablet Take 1 tablet (40 mg total) by mouth daily. 30 tablet 5  . potassium chloride (KLOR-CON) 10 MEQ tablet Take 1 tablet (10 mEq total) by mouth 2 (two) times daily. 60 tablet 5  . traMADol (ULTRAM) 50 MG tablet Take 2 tablets (100 mg total) by mouth daily as needed. 60 tablet 5   No facility-administered medications prior to visit.    Allergies  Allergen Reactions  . Iodinated Diagnostic Agents Anaphylaxis  . Codeine Other (See Comments)    Hallucinations  . Diphenhydramine Other (See Comments)    Patient states "feels like somebody is inside of me clawing me out"    Review of Systems Negative unless  specially indicated above in HPI.    Objective:    Physical Exam Vitals and nursing note reviewed.  Constitutional:      General: She is not in acute distress.    Appearance: Normal appearance. She is not ill-appearing, toxic-appearing or diaphoretic.  Eyes:     Conjunctiva/sclera: Conjunctivae normal.     Pupils: Pupils are equal, round, and reactive to light.  Neck:     Vascular: No carotid bruit.  Cardiovascular:     Rate and Rhythm: Normal rate and regular rhythm.     Heart sounds: Normal heart sounds. No murmur heard.   Pulmonary:     Effort: Pulmonary effort is normal. No respiratory distress.     Breath sounds: Normal breath sounds. No wheezing or rales.  Abdominal:     General: There is no distension.     Palpations: Abdomen is soft.     Tenderness:  There is no abdominal tenderness.  Musculoskeletal:       Arms:     Cervical back: Neck supple.     Right lower leg: 1+ Edema present.     Left lower leg: 1+ Edema present.  Skin:    General: Skin is warm and dry.  Neurological:     General: No focal deficit present.     Mental Status: She is alert and oriented to person, place, and time.  Psychiatric:        Mood and Affect: Mood normal.        Behavior: Behavior normal.        Thought Content: Thought content normal.     BP (!) 167/84 (BP Location: Right Arm)   Pulse 84   Temp 99.3 F (37.4 C) (Temporal)   Ht 5\' 1"  (1.549 m)   Wt 209 lb 4 oz (94.9 kg)   BMI 39.54 kg/m  Wt Readings from Last 3 Encounters:  05/24/20 209 lb 4 oz (94.9 kg)  04/02/20 212 lb (96.2 kg)  03/16/20 213 lb 9.6 oz (96.9 kg)    Lab Results  Component Value Date   TSH 0.536 06/27/2019   Lab Results  Component Value Date   WBC 8.4 06/27/2019   HGB 12.1 06/27/2019   HCT 37.1 06/27/2019   MCV 83 06/27/2019   PLT 327 06/27/2019   Lab Results  Component Value Date   NA 144 03/16/2020   K 4.3 03/16/2020   CO2 25 03/16/2020   GLUCOSE 99 03/16/2020   BUN 12 03/16/2020   CREATININE 0.74 03/16/2020   BILITOT <0.2 06/27/2019   ALKPHOS 74 06/27/2019   AST 18 06/27/2019   ALT 15 06/27/2019   PROT 6.5 06/27/2019   ALBUMIN 4.2 06/27/2019   CALCIUM 10.1 03/16/2020   ANIONGAP 8 07/18/2016   Lab Results  Component Value Date   CHOL 212 (H) 06/27/2019   Lab Results  Component Value Date   HDL 93 06/27/2019   Lab Results  Component Value Date   LDLCALC 99 06/27/2019   Lab Results  Component Value Date   TRIG 118 06/27/2019   Lab Results  Component Value Date   CHOLHDL 2.3 06/27/2019   Lab Results  Component Value Date   HGBA1C 5.8 06/27/2019       Assessment & Plan:   Pegah was seen today for fall.  Diagnoses and all orders for this visit:  Fall, sequela Fall last week. Discussed safety at home.   Rib pain on right  side Negative Xray at an UC per patient.  Likely bruising. Steroid burst. Can continue tramadol and tylenol as needed. Discussed short term use of ibuprofen as well as her last GFR was 77 and she is taking Nexium daily. Return to office for new or worsening symptoms, or if symptoms persist.  -     predniSONE (DELTASONE) 20 MG tablet; 2 po at sametime daily for 5 days  Essential hypertension Uncontrolled. Start amlodipine. BP log given. Keep scheduled appointment with PCP on 06/08/20 for follow up. Discussed when to go to ED. Return to office for new or worsening symptoms, or if symptoms persist.  -     amLODipine (NORVASC) 5 MG tablet; Take 1 tablet (5 mg total) by mouth daily.   Follow up: Keep scheduled appt with PCP on 1/25, sooner if needed.    The patient indicates understanding of these issues and agrees with the plan.   Gwenlyn Perking, FNP

## 2020-05-24 NOTE — Patient Instructions (Signed)
Hypertension, Adult High blood pressure (hypertension) is when the force of blood pumping through the arteries is too strong. The arteries are the blood vessels that carry blood from the heart throughout the body. Hypertension forces the heart to work harder to pump blood and may cause arteries to become narrow or stiff. Untreated or uncontrolled hypertension can cause a heart attack, heart failure, a stroke, kidney disease, and other problems. A blood pressure reading consists of a higher number over a lower number. Ideally, your blood pressure should be below 120/80. The first ("top") number is called the systolic pressure. It is a measure of the pressure in your arteries as your heart beats. The second ("bottom") number is called the diastolic pressure. It is a measure of the pressure in your arteries as the heart relaxes. What are the causes? The exact cause of this condition is not known. There are some conditions that result in or are related to high blood pressure. What increases the risk? Some risk factors for high blood pressure are under your control. The following factors may make you more likely to develop this condition:  Smoking.  Having type 2 diabetes mellitus, high cholesterol, or both.  Not getting enough exercise or physical activity.  Being overweight.  Having too much fat, sugar, calories, or salt (sodium) in your diet.  Drinking too much alcohol. Some risk factors for high blood pressure may be difficult or impossible to change. Some of these factors include:  Having chronic kidney disease.  Having a family history of high blood pressure.  Age. Risk increases with age.  Race. You may be at higher risk if you are African American.  Gender. Men are at higher risk than women before age 45. After age 65, women are at higher risk than men.  Having obstructive sleep apnea.  Stress. What are the signs or symptoms? High blood pressure may not cause symptoms. Very high  blood pressure (hypertensive crisis) may cause:  Headache.  Anxiety.  Shortness of breath.  Nosebleed.  Nausea and vomiting.  Vision changes.  Severe chest pain.  Seizures. How is this diagnosed? This condition is diagnosed by measuring your blood pressure while you are seated, with your arm resting on a flat surface, your legs uncrossed, and your feet flat on the floor. The cuff of the blood pressure monitor will be placed directly against the skin of your upper arm at the level of your heart. It should be measured at least twice using the same arm. Certain conditions can cause a difference in blood pressure between your right and left arms. Certain factors can cause blood pressure readings to be lower or higher than normal for a short period of time:  When your blood pressure is higher when you are in a health care provider's office than when you are at home, this is called white coat hypertension. Most people with this condition do not need medicines.  When your blood pressure is higher at home than when you are in a health care provider's office, this is called masked hypertension. Most people with this condition may need medicines to control blood pressure. If you have a high blood pressure reading during one visit or you have normal blood pressure with other risk factors, you may be asked to:  Return on a different day to have your blood pressure checked again.  Monitor your blood pressure at home for 1 week or longer. If you are diagnosed with hypertension, you may have other blood or   imaging tests to help your health care provider understand your overall risk for other conditions. How is this treated? This condition is treated by making healthy lifestyle changes, such as eating healthy foods, exercising more, and reducing your alcohol intake. Your health care provider may prescribe medicine if lifestyle changes are not enough to get your blood pressure under control, and  if:  Your systolic blood pressure is above 130.  Your diastolic blood pressure is above 80. Your personal target blood pressure may vary depending on your medical conditions, your age, and other factors. Follow these instructions at home: Eating and drinking  Eat a diet that is high in fiber and potassium, and low in sodium, added sugar, and fat. An example eating plan is called the DASH (Dietary Approaches to Stop Hypertension) diet. To eat this way: ? Eat plenty of fresh fruits and vegetables. Try to fill one half of your plate at each meal with fruits and vegetables. ? Eat whole grains, such as whole-wheat pasta, brown rice, or whole-grain bread. Fill about one fourth of your plate with whole grains. ? Eat or drink low-fat dairy products, such as skim milk or low-fat yogurt. ? Avoid fatty cuts of meat, processed or cured meats, and poultry with skin. Fill about one fourth of your plate with lean proteins, such as fish, chicken without skin, beans, eggs, or tofu. ? Avoid pre-made and processed foods. These tend to be higher in sodium, added sugar, and fat.  Reduce your daily sodium intake. Most people with hypertension should eat less than 1,500 mg of sodium a day.  Do not drink alcohol if: ? Your health care provider tells you not to drink. ? You are pregnant, may be pregnant, or are planning to become pregnant.  If you drink alcohol: ? Limit how much you use to:  0-1 drink a day for women.  0-2 drinks a day for men. ? Be aware of how much alcohol is in your drink. In the [place] one drink equals one 12 oz bottle of beer (355 mL), one 5 oz glass of wine (148 mL), or one 1 oz glass of hard liquor (44 mL).   Lifestyle  Work with your health care provider to maintain a healthy body weight or to lose weight. Ask what an ideal weight is for you.  Get at least 30 minutes of exercise most days of the week. Activities may include walking, swimming, or biking.  Include exercise to  strengthen your muscles (resistance exercise), such as Pilates or lifting weights, as part of your weekly exercise routine. Try to do these types of exercises for 30 minutes at least 3 days a week.  Do not use any products that contain nicotine or tobacco, such as cigarettes, e-cigarettes, and chewing tobacco. If you need help quitting, ask your health care provider.  Monitor your blood pressure at home as told by your health care provider.  Keep all follow-up visits as told by your health care provider. This is important.   Medicines  Take over-the-counter and prescription medicines only as told by your health care provider. Follow directions carefully. Blood pressure medicines must be taken as prescribed.  Do not skip doses of blood pressure medicine. Doing this puts you at risk for problems and can make the medicine less effective.  Ask your health care provider about side effects or reactions to medicines that you should watch for. Contact a health care provider if you:  Think you are having a reaction to a   medicine you are taking.  Have headaches that keep coming back (recurring).  Feel dizzy.  Have swelling in your ankles.  Have trouble with your vision. Get help right away if you:  Develop a severe headache or confusion.  Have unusual weakness or numbness.  Feel faint.  Have severe pain in your chest or abdomen.  Vomit repeatedly.  Have trouble breathing. Summary  Hypertension is when the force of blood pumping through your arteries is too strong. If this condition is not controlled, it may put you at risk for serious complications.  Your personal target blood pressure may vary depending on your medical conditions, your age, and other factors. For most people, a normal blood pressure is less than 120/80.  Hypertension is treated with lifestyle changes, medicines, or a combination of both. Lifestyle changes include losing weight, eating a healthy, low-sodium diet,  exercising more, and limiting alcohol. This information is not intended to replace advice given to you by your health care provider. Make sure you discuss any questions you have with your health care provider. Document Revised: 01/09/2018 Document Reviewed: 01/09/2018 Elsevier Patient Education  2021 [employer]. Chest Wall Pain Chest wall pain is pain in or around the bones and muscles of your chest. Chest wall pain may be caused by:  An injury.  Coughing a lot.  Using your chest and arm muscles too much. Sometimes, the cause may not be known. This pain may take a few weeks or longer to get better. Follow these instructions at home: Managing pain, stiffness, and swelling If told, put ice on the painful area:  Put ice in a plastic bag.  Place a towel between your skin and the bag.  Leave the ice on for 20 minutes, 2-3 times a day.   Activity  Rest as told by your doctor.  Avoid doing things that cause pain. This includes lifting heavy items.  Ask your doctor what activities are safe for you. General instructions  Take over-the-counter and prescription medicines only as told by your doctor.  Do not use any products that contain nicotine or tobacco, such as cigarettes, e-cigarettes, and chewing tobacco. If you need help quitting, ask your doctor.  Keep all follow-up visits as told by your doctor. This is important.   Contact a doctor if:  You have a fever.  Your chest pain gets worse.  You have new symptoms. Get help right away if:  You feel sick to your stomach (nauseous) or you throw up (vomit).  You feel sweaty or light-headed.  You have a cough with mucus from your lungs (sputum) or you cough up blood.  You are short of breath. These symptoms may be an emergency. Do not wait to see if the symptoms will go away. Get medical help right away. Call your local emergency services (911 in the [place] Do not drive yourself to the hospital. Summary  Chest wall pain is  pain in or around the bones and muscles of your chest.  It may be treated with ice, rest, and medicines. Your condition may also get better if you avoid doing things that cause pain.  Contact a doctor if you have a fever, chest pain that gets worse, or new symptoms.  Get help right away if you feel light-headed or you get short of breath. These symptoms may be an emergency. This information is not intended to replace advice given to you by your health care provider. Make sure you discuss any questions you have with your  health care provider. Document Revised: 11/01/2017 Document Reviewed: 11/01/2017 Elsevier Patient Education  2021 [employer].

## 2020-06-02 ENCOUNTER — Other Ambulatory Visit: Payer: Self-pay | Admitting: Family Medicine

## 2020-06-02 DIAGNOSIS — Z1231 Encounter for screening mammogram for malignant neoplasm of breast: Secondary | ICD-10-CM

## 2020-06-08 ENCOUNTER — Other Ambulatory Visit: Payer: Self-pay

## 2020-06-08 ENCOUNTER — Encounter: Payer: Self-pay | Admitting: Family Medicine

## 2020-06-08 ENCOUNTER — Ambulatory Visit (INDEPENDENT_AMBULATORY_CARE_PROVIDER_SITE_OTHER): Payer: Medicare Other | Admitting: Family Medicine

## 2020-06-08 VITALS — BP 164/75 | HR 81 | Temp 98.4°F | Ht 61.0 in | Wt 206.6 lb

## 2020-06-08 DIAGNOSIS — Z1159 Encounter for screening for other viral diseases: Secondary | ICD-10-CM | POA: Diagnosis not present

## 2020-06-08 DIAGNOSIS — R609 Edema, unspecified: Secondary | ICD-10-CM

## 2020-06-08 DIAGNOSIS — M8949 Other hypertrophic osteoarthropathy, multiple sites: Secondary | ICD-10-CM | POA: Diagnosis not present

## 2020-06-08 DIAGNOSIS — G250 Essential tremor: Secondary | ICD-10-CM | POA: Diagnosis not present

## 2020-06-08 DIAGNOSIS — E669 Obesity, unspecified: Secondary | ICD-10-CM | POA: Insufficient documentation

## 2020-06-08 DIAGNOSIS — K219 Gastro-esophageal reflux disease without esophagitis: Secondary | ICD-10-CM

## 2020-06-08 DIAGNOSIS — S2231XD Fracture of one rib, right side, subsequent encounter for fracture with routine healing: Secondary | ICD-10-CM

## 2020-06-08 DIAGNOSIS — I10 Essential (primary) hypertension: Secondary | ICD-10-CM | POA: Diagnosis not present

## 2020-06-08 DIAGNOSIS — M159 Polyosteoarthritis, unspecified: Secondary | ICD-10-CM

## 2020-06-08 MED ORDER — POTASSIUM CHLORIDE ER 10 MEQ PO TBCR: 10.0000 meq | EXTENDED_RELEASE_TABLET | Freq: Two times a day (BID) | ORAL | 1 refills | Status: DC

## 2020-06-08 MED ORDER — ESOMEPRAZOLE MAGNESIUM 40 MG PO CPDR: DELAYED_RELEASE_CAPSULE | ORAL | 1 refills | Status: DC

## 2020-06-08 MED ORDER — AMLODIPINE BESYLATE 10 MG PO TABS: 10.0000 mg | ORAL_TABLET | Freq: Every day | ORAL | 1 refills | Status: DC

## 2020-06-08 MED ORDER — FUROSEMIDE 20 MG PO TABS: 40.0000 mg | ORAL_TABLET | Freq: Every day | ORAL | 1 refills | Status: DC

## 2020-06-08 MED ORDER — CELECOXIB 200 MG PO CAPS: ORAL_CAPSULE | ORAL | 1 refills | Status: DC

## 2020-06-08 MED ORDER — LISINOPRIL 40 MG PO TABS: 40.0000 mg | ORAL_TABLET | Freq: Every day | ORAL | 1 refills | Status: DC

## 2020-06-08 NOTE — Patient Instructions (Signed)

## 2020-06-08 NOTE — Progress Notes (Signed)
Assessment & Plan:  1. Primary osteoarthritis involving multiple joints - Well controlled on current regimen. Controlled substance agreement in place for Tramadol. Urine drug screen as expected in April 2021.  - celecoxib (CELEBREX) 200 MG capsule; TAKE (1) CAPSULE DAILY.  Dispense: 90 capsule; Refill: 1 - CMP14+EGFR  2. Essential hypertension - Uncontrolled. Continue Lisinopril 40 mg QD. Increase Amlodipine from 5 mg to 10 mg once daily.  Encouraged patient to monitor her blood pressure at home and let me know if she is staying above 150/90.  Education provided on the DASH diet. - amLODipine (NORVASC) 10 MG tablet; Take 1 tablet (10 mg total) by mouth daily.  Dispense: 90 tablet; Refill: 1 - lisinopril (ZESTRIL) 40 MG tablet; Take 1 tablet (40 mg total) by mouth daily.  Dispense: 90 tablet; Refill: 1 - CBC with Differential/Platelet - CMP14+EGFR - Lipid panel  3. Edema, unspecified type - Well controlled on current regimen.  - potassium chloride (KLOR-CON) 10 MEQ tablet; Take 1 tablet (10 mEq total) by mouth 2 (two) times daily.  Dispense: 180 tablet; Refill: 1 - furosemide (LASIX) 20 MG tablet; Take 2 tablets (40 mg total) by mouth daily.  Dispense: 180 tablet; Refill: 1 - CMP14+EGFR  4. Gastroesophageal reflux disease without esophagitis - Well controlled on current regimen.  - esomeprazole (NEXIUM) 40 MG capsule; TAKE (1) CAPSULE DAILY.  Dispense: 90 capsule; Refill: 1 - CMP14+EGFR  5. Essential tremor - Reassurance provided. Discussed with her GAD score of 2 it does not appear she has anxiety either.  Discussed we would not be doing Xanax for her getting while on the telephone.  6. Closed fracture of one rib of right side with routine healing, subsequent encounter - Discussed appropriate treatment of rib fractures as patient wanted to make sure there was nothing else she should be doing.  She does have Celebrex that she takes once daily for pain as well as tramadol.  7.  Encounter for hepatitis C screening test for low risk patient - Hepatitis C antibody   Return in about 3 months (around 09/06/2020) for HTN, pain.  Hendricks Limes, MSN, APRN, FNP-C [facility]  Subjective:    Patient ID: Melissa Zavala, female    DOB: 1941/01/10, 80 y.o.   MRN: [id number]  Patient Care Team: Melissa Brooklyn, FNP as PCP - General (Family Medicine) Melissa Bowie Alphonse Guild, MD as PCP - Cardiology (Cardiology) Melissa China, RN as Case Manager Melissa Prose, MD as Surgeon (Student)   Chief Complaint:  Chief Complaint  Patient presents with  . Hypertension    3 month follow up     HPI: Melissa Zavala is a 80 y.o. female presenting on 06/08/2020 for Hypertension (3 month follow up/)  Pain assessment: Cause of pain- arthritis Pain location- legs & back Pain on scale of 1-10- 7-8/10 without medication; 6/10 with medication Frequency- comes and goes What increases pain- prolonged standing What makes pain better- rest and medication Effects on ADL- none Any change in general medical condition- none  Current opioids rx- Tramadol 100 mg daily as needed # meds rx- 60 Effectiveness of current meds- effective Adverse reactions form pain meds- none Morphine equivalent- 10 MME/day  Pill count performed-No Last drug screen - 09/02/2019 ( high risk q4m moderate risk [id number] risk yearly ) Urine drug [place] today- No [place]  If yes were their any concerning findings? - No  Overdose risk: 230  Opioid Risk  12/02/2019  Alcohol 0  Illegal Drugs 3  Rx Drugs 4  Alcohol 0  Illegal Drugs 0  Rx Drugs 0  Age between 16-45 years  0  History of Preadolescent Sexual Abuse 3  Psychological Disease 0  Depression 0  Opioid Risk Tool Scoring 10  Opioid Risk Interpretation High Risk   Pain contract signed on: 09/02/2019   Hypertension: patient reports her average BP at home is 140-150s/70s. This morning it was 144/66. She is  taking her medication regularly. She has cut down on the amount of pork she is eating.   New complaints: Patient reports her children think she needs medication for anxiety due to talking loud on the phone and her tremor. The son told her to ask for hydroxyzine. Patient has a 10 mg prescription of this that she was given in the past for itching and she states it makes her jittery. Her daughter told her to ask for Xanax.   GAD 7 : Generalized Anxiety Score 06/08/2020  Nervous, Anxious, on Edge 0  Control/stop worrying 0  Worry too much - different things 1  Trouble relaxing 1  Restless 0  Easily annoyed or irritable 0  Afraid - awful might happen 0  Total GAD 7 Score 2  Anxiety Difficulty Not difficult at all    Patient also reports she sustained a fall when she tripped over a rug. She did go to an urgent care in [place] and was told she had a fractured rib on the right side.    Social history:  Relevant past medical, surgical, family and social history reviewed and updated as indicated. Interim medical history since our last visit reviewed.  Allergies and medications reviewed and updated.  DATA REVIEWED: CHART IN EPIC  ROS: Negative unless specifically indicated above in HPI.    Current Outpatient Medications:  .  amLODipine (NORVASC) 5 MG tablet, Take 1 tablet (5 mg total) by mouth daily., Disp: 90 tablet, Rfl: 3 .  celecoxib (CELEBREX) 200 MG capsule, TAKE (1) CAPSULE DAILY., Disp: 30 capsule, Rfl: 5 .  esomeprazole (NEXIUM) 40 MG capsule, TAKE (1) CAPSULE DAILY., Disp: 30 capsule, Rfl: 5 .  furosemide (LASIX) 20 MG tablet, Take 2 tablets (40 mg total) by mouth daily., Disp: 60 tablet, Rfl: 5 .  lisinopril (ZESTRIL) 40 MG tablet, Take 1 tablet (40 mg total) by mouth daily., Disp: 30 tablet, Rfl: 5 .  potassium chloride (KLOR-CON) 10 MEQ tablet, Take 1 tablet (10 mEq total) by mouth 2 (two) times daily., Disp: 60 tablet, Rfl: 5 .  traMADol (ULTRAM) 50 MG tablet, Take 2  tablets (100 mg total) by mouth daily as needed., Disp: 60 tablet, Rfl: 5   Allergies  Allergen Reactions  . Iodinated Diagnostic Agents Anaphylaxis  . Codeine Other (See Comments)    Hallucinations  . Diphenhydramine Other (See Comments)    Patient states "feels like somebody is inside of me clawing me out"   Past Medical History:  Diagnosis Date  . Arthritis   . Hypertension     Past Surgical History:  Procedure Laterality Date  . TOTAL HIP ARTHROPLASTY Right   . TOTAL KNEE ARTHROPLASTY Bilateral   . TUBAL LIGATION      Social History   Socioeconomic History  . Marital status: Legally Separated    Spouse name: Not on file  . Number of children: 7  . Years of education: Not on file  . Highest education level: 8th grade  Occupational History  . Not on file  Tobacco Use  . Smoking status: Never Smoker  . Smokeless tobacco: Never Used  Vaping Use  . Vaping Use: Never used  Substance and Sexual Activity  . Alcohol use: No  . Drug use: No  . Sexual activity: Not Currently  Other Topics Concern  . Not on file  Social History Narrative  . Not on file   Social Determinants of Health   Financial Resource Strain: Not on file  Food Insecurity: Not on file  Transportation Needs: Not on file  Physical Activity: Not on file  Stress: Not on file  Social Connections: Not on file  Intimate Partner Violence: Not on file        Objective:    BP (!) 164/75   Pulse 81   Temp 98.4 F (36.9 C) (Temporal)   Ht 5' 1"  (1.549 m)   Wt 206 lb 9.6 oz (93.7 kg)   SpO2 96%   BMI 39.04 kg/m   Wt Readings from Last 3 Encounters:  05/24/20 209 lb 4 oz (94.9 kg)  04/02/20 212 lb (96.2 kg)  03/16/20 213 lb 9.6 oz (96.9 kg)    Physical Exam Vitals reviewed.  Constitutional:      General: She is not in acute distress.    Appearance: Normal appearance. She is obese. She is not ill-appearing, toxic-appearing or diaphoretic.  HENT:     Head: Normocephalic and atraumatic.   Eyes:     General: No scleral icterus.       Right eye: No discharge.        Left eye: No discharge.     Conjunctiva/sclera: Conjunctivae normal.  Cardiovascular:     Rate and Rhythm: Normal rate and regular rhythm.     Heart sounds: Normal heart sounds. No murmur heard. No friction rub. No gallop.   Pulmonary:     Effort: Pulmonary effort is normal. No respiratory distress.     Breath sounds: Normal breath sounds. No stridor. No wheezing, rhonchi or rales.  Musculoskeletal:        General: Normal range of motion.     Cervical back: Normal range of motion.  Skin:    General: Skin is warm and dry.     Capillary Refill: Capillary refill takes less than 2 seconds.  Neurological:     General: No focal deficit present.     Mental Status: She is alert and oriented to person, place, and time. Mental status is at baseline.  Psychiatric:        Mood and Affect: Mood normal.        Behavior: Behavior normal.        Thought Content: Thought content normal.        Judgment: Judgment normal.     Lab Results  Component Value Date   TSH 0.536 06/27/2019   Lab Results  Component Value Date   WBC 8.4 06/27/2019   HGB 12.1 06/27/2019   HCT 37.1 06/27/2019   MCV 83 06/27/2019   PLT 327 06/27/2019   Lab Results  Component Value Date   NA 144 03/16/2020   K 4.3 03/16/2020   CO2 25 03/16/2020   GLUCOSE 99 03/16/2020   BUN 12 03/16/2020   CREATININE 0.74 03/16/2020   BILITOT <0.2 06/27/2019   ALKPHOS 74 06/27/2019   AST 18 06/27/2019   ALT 15 06/27/2019   PROT 6.5 06/27/2019   ALBUMIN 4.2 06/27/2019   CALCIUM 10.1 03/16/2020   ANIONGAP 8 07/18/2016   Lab Results  Component Value  Date   CHOL 212 (H) 06/27/2019   Lab Results  Component Value Date   HDL 93 06/27/2019   Lab Results  Component Value Date   LDLCALC 99 06/27/2019   Lab Results  Component Value Date   TRIG 118 06/27/2019   Lab Results  Component Value Date   CHOLHDL 2.3 06/27/2019   Lab Results   Component Value Date   HGBA1C 5.8 06/27/2019

## 2020-06-09 LAB — CBC WITH DIFFERENTIAL/PLATELET
Basophils Absolute: 0 10*3/uL (ref 0.0–0.2)
Basos: 1 %
EOS (ABSOLUTE): 0.2 10*3/uL (ref 0.0–0.4)
Eos: 2 %
Hematocrit: 37.7 % (ref 34.0–46.6)
Hemoglobin: 12.3 g/dL (ref 11.1–15.9)
Immature Grans (Abs): 0 10*3/uL (ref 0.0–0.1)
Immature Granulocytes: 0 %
Lymphocytes Absolute: 2.5 10*3/uL (ref 0.7–3.1)
Lymphs: 31 %
MCH: 27 pg (ref 26.6–33.0)
MCHC: 32.6 g/dL (ref 31.5–35.7)
MCV: 83 fL (ref 79–97)
Monocytes Absolute: 0.6 10*3/uL (ref 0.1–0.9)
Monocytes: 8 %
Neutrophils Absolute: 4.8 10*3/uL (ref 1.4–7.0)
Neutrophils: 58 %
Platelets: 254 10*3/uL (ref 150–450)
RBC: 4.56 x10E6/uL (ref 3.77–5.28)
RDW: 13.7 % (ref 11.7–15.4)
WBC: 8.1 10*3/uL (ref 3.4–10.8)

## 2020-06-09 LAB — CMP14+EGFR
ALT: 12 IU/L (ref 0–32)
AST: 14 IU/L (ref 0–40)
Albumin/Globulin Ratio: 1.7 (ref 1.2–2.2)
Albumin: 4.3 g/dL (ref 3.7–4.7)
Alkaline Phosphatase: 83 IU/L (ref 44–121)
BUN/Creatinine Ratio: 16 (ref 12–28)
BUN: 12 mg/dL (ref 8–27)
Bilirubin Total: 0.5 mg/dL (ref 0.0–1.2)
CO2: 23 mmol/L (ref 20–29)
Calcium: 10.3 mg/dL (ref 8.7–10.3)
Chloride: 103 mmol/L (ref 96–106)
Creatinine, Ser: 0.73 mg/dL (ref 0.57–1.00)
GFR calc Af Amer: 91 mL/min/{1.73_m2} (ref >59)
GFR calc non Af Amer: 79 mL/min/{1.73_m2} (ref >59)
Globulin, Total: 2.6 g/dL (ref 1.5–4.5)
Glucose: 98 mg/dL (ref 65–99)
Potassium: 4.4 mmol/L (ref 3.5–5.2)
Sodium: 141 mmol/L (ref 134–144)
Total Protein: 6.9 g/dL (ref 6.0–8.5)

## 2020-06-09 LAB — LIPID PANEL
Chol/HDL Ratio: 2.4 ratio (ref 0.0–4.4)
Cholesterol, Total: 223 mg/dL — ABNORMAL HIGH (ref 100–199)
HDL: 92 mg/dL (ref >39)
LDL Chol Calc (NIH): 118 mg/dL — ABNORMAL HIGH (ref 0–99)
Triglycerides: 73 mg/dL (ref 0–149)
VLDL Cholesterol Cal: 13 mg/dL (ref 5–40)

## 2020-06-09 LAB — HEPATITIS C ANTIBODY: Hep C Virus Ab: <0.1 s/co ratio (ref 0.0–0.9)

## 2020-06-14 ENCOUNTER — Encounter: Payer: Self-pay | Admitting: Family Medicine

## 2020-06-14 DIAGNOSIS — E782 Mixed hyperlipidemia: Secondary | ICD-10-CM

## 2020-06-14 HISTORY — DX: Mixed hyperlipidemia: E78.2

## 2020-07-07 ENCOUNTER — Ambulatory Visit
Admission: RE | Admit: 2020-07-07 | Discharge: 2020-07-07 | Disposition: A | Discharge status: Home / Self Care | Payer: Medicare Other | Source: Ambulatory Visit | Attending: Family Medicine | Admitting: Family Medicine

## 2020-07-07 ENCOUNTER — Other Ambulatory Visit: Payer: Self-pay

## 2020-07-07 DIAGNOSIS — Z1231 Encounter for screening mammogram for malignant neoplasm of breast: Secondary | ICD-10-CM | POA: Diagnosis not present

## 2020-08-25 ENCOUNTER — Other Ambulatory Visit: Payer: Self-pay | Admitting: Family Medicine

## 2020-08-25 DIAGNOSIS — M8949 Other hypertrophic osteoarthropathy, multiple sites: Secondary | ICD-10-CM

## 2020-08-25 DIAGNOSIS — M159 Polyosteoarthritis, unspecified: Secondary | ICD-10-CM

## 2020-09-07 ENCOUNTER — Telehealth: Payer: Self-pay

## 2020-09-07 ENCOUNTER — Ambulatory Visit (INDEPENDENT_AMBULATORY_CARE_PROVIDER_SITE_OTHER): Payer: Medicare Other | Admitting: Family Medicine

## 2020-09-07 ENCOUNTER — Other Ambulatory Visit: Payer: Self-pay

## 2020-09-07 ENCOUNTER — Encounter: Payer: Self-pay | Admitting: Family Medicine

## 2020-09-07 VITALS — BP 156/70 | HR 89 | Temp 98.5°F | Ht 61.0 in | Wt 204.4 lb

## 2020-09-07 DIAGNOSIS — Z79899 Other long term (current) drug therapy: Secondary | ICD-10-CM | POA: Insufficient documentation

## 2020-09-07 DIAGNOSIS — Z96641 Presence of right artificial hip joint: Secondary | ICD-10-CM | POA: Diagnosis not present

## 2020-09-07 DIAGNOSIS — E782 Mixed hyperlipidemia: Secondary | ICD-10-CM

## 2020-09-07 DIAGNOSIS — M8949 Other hypertrophic osteoarthropathy, multiple sites: Secondary | ICD-10-CM | POA: Diagnosis not present

## 2020-09-07 DIAGNOSIS — M159 Polyosteoarthritis, unspecified: Secondary | ICD-10-CM

## 2020-09-07 DIAGNOSIS — L720 Epidermal cyst: Secondary | ICD-10-CM | POA: Diagnosis not present

## 2020-09-07 DIAGNOSIS — M25551 Pain in right hip: Secondary | ICD-10-CM | POA: Diagnosis not present

## 2020-09-07 DIAGNOSIS — I10 Essential (primary) hypertension: Secondary | ICD-10-CM | POA: Diagnosis not present

## 2020-09-07 DIAGNOSIS — F4321 Adjustment disorder with depressed mood: Secondary | ICD-10-CM

## 2020-09-07 MED ORDER — ATORVASTATIN CALCIUM 10 MG PO TABS: 10.0000 mg | ORAL_TABLET | Freq: Every day | ORAL | 2 refills | Status: DC

## 2020-09-07 MED ORDER — TRAMADOL HCL 50 MG PO TABS: 100.0000 mg | ORAL_TABLET | Freq: Every day | ORAL | 2 refills | Status: DC | PRN

## 2020-09-07 MED ORDER — METHYLPREDNISOLONE ACETATE 80 MG/ML IJ SUSP
80.0000 mg | Freq: Once | INTRAMUSCULAR | Status: AC
  Administered 2020-09-07: 80 mg via INTRAMUSCULAR

## 2020-09-07 NOTE — Progress Notes (Signed)
Assessment & Plan:  1. Primary osteoarthritis involving multiple joints Well controlled on current regimen.  - traMADol (ULTRAM) 50 MG tablet; Take 2 tablets (100 mg total) by mouth daily as needed.  Dispense: 60 tablet; Refill: 2 - ToxASSURE Select 13 (MW), Urine  2. Controlled substance agreement signed Controlled substance agreement updated today.  Urine drug screen collected today.  PDMP reviewed with no concerning findings. - ToxASSURE Select 13 (MW), Urine  3. Essential hypertension Patient reports control at home.  She has not yet had her medication this morning and is in pain.  4. Mixed hyperlipidemia Started on atorvastatin today. - atorvastatin (LIPITOR) 10 MG tablet; Take 1 tablet (10 mg total) by mouth daily.  Dispense: 30 tablet; Refill: 2  5. Grief Declines counseling and medication.  6. Epidermoid cyst - Ambulatory referral to Dermatology  7. Right hip pain - methylPREDNISolone acetate (DEPO-MEDROL) injection 80 mg - Ambulatory referral to Orthopedic Surgery  8. History of right hip replacement - Ambulatory referral to Orthopedic Surgery    Return in about 3 months (around 12/07/2020) for follow-up of chronic medication conditions.  Hendricks Limes, MSN, APRN, FNP-C [facility]  Subjective:    Patient ID: Melissa Zavala, female    DOB: [birth year], 80 y.o.   MRN: [id number]  Patient Care Team: Loman Brooklyn, FNP as PCP - General (Family Medicine) Harl Bowie Alphonse Guild, MD as PCP - Cardiology (Cardiology) Ilean China, RN as Case Manager Vela Prose, MD as Surgeon (Student)   Chief Complaint:  Chief Complaint  Patient presents with  . Hypertension  . Pain Management    3 month follow up   . Hip Pain    X 2 weeks right hip   . lumps    Patient states that she noticed two lumps under bilateral arms that have been there x 1 month.     HPI: Melissa Zavala is a 80 y.o. female presenting on 09/07/2020 for Hypertension,  Pain Management (3 month follow up ), Hip Pain (X 2 weeks right hip ), and lumps (Patient states that she noticed two lumps under bilateral arms that have been there x 1 month. )  Pain assessment: Cause of pain- arthritis Pain location- legs & back Pain on scale of 1-10- 7-8/10 without medication; 6/10 with medication Frequency- comes and goes What increases pain- prolonged standing What makes pain better- rest and medication Effects on ADL- none Any change in general medical condition- patient reports she has been having right hip pain that started 2-3 weeks ago. She rates that pain 10/10 when walking. She had her right hip replaced ~2 years ago per her report.   Current opioids rx- Tramadol 100 mg daily as needed # meds rx- 60 Effectiveness of current meds- effective - states she only takes in the morning because if she takes after lunch she is unable to sleep at night Adverse reactions form pain meds- none Morphine equivalent- 10 MME/day  Pill count performed-No Last drug screen - 09/02/2019 ( high risk q12m, moderate risk q35m, low risk yearly ) Urine drug screen today- Yes Was [place]- Yes  If yes were their any concerning findings? - No  Overdose risk: 060  Opioid Risk  12/02/2019  Alcohol 0  Illegal Drugs 3  Rx Drugs 4  Alcohol 0  Illegal Drugs 0  Rx Drugs 0  Age between 16-45 years  0  History of Preadolescent Sexual Abuse 3  Psychological Disease 0  Depression 0  Opioid Risk Tool Scoring 10  Opioid Risk Interpretation High Risk   Pain contract signed on: 09/02/2019 - updated today   Hypertension: patient reports her average BP at home is 130-140s/60-70s. She is taking her medication regularly. She feels her blood pressure is elevated today due to pain and the fact that she has not yet taken her medication this morning.  Patient is not exercising.  Hyperlipidemia: It was recommended patient start on medication after her lab work 3 months ago, but we were  unable to get in touch with her.  Her current ASCVD risk score is 45.4%.  New complaints: Patient mentioned that her son passed away due to cancer since the last time she was here.  She has not done any grief counseling.  She does not feel she needs counseling or medication at this time.  Patient reports she has a knot under both arms.  Social history:  Relevant past medical, surgical, family and social history reviewed and updated as indicated. Interim medical history since our last visit reviewed.  Allergies and medications reviewed and updated.  DATA REVIEWED: CHART IN EPIC  ROS: Negative unless specifically indicated above in HPI.    Current Outpatient Medications:  .  amLODipine (NORVASC) 10 MG tablet, Take 1 tablet (10 mg total) by mouth daily., Disp: 90 tablet, Rfl: 1 .  atorvastatin (LIPITOR) 10 MG tablet, Take 1 tablet (10 mg total) by mouth daily., Disp: 30 tablet, Rfl: 2 .  celecoxib (CELEBREX) 200 MG capsule, TAKE (1) CAPSULE DAILY., Disp: 90 capsule, Rfl: 1 .  esomeprazole (NEXIUM) 40 MG capsule, TAKE (1) CAPSULE DAILY., Disp: 90 capsule, Rfl: 1 .  furosemide (LASIX) 20 MG tablet, Take 2 tablets (40 mg total) by mouth daily., Disp: 180 tablet, Rfl: 1 .  lisinopril (ZESTRIL) 40 MG tablet, Take 1 tablet (40 mg total) by mouth daily., Disp: 90 tablet, Rfl: 1 .  potassium chloride (KLOR-CON) 10 MEQ tablet, Take 1 tablet (10 mEq total) by mouth 2 (two) times daily., Disp: 180 tablet, Rfl: 1 .  traMADol (ULTRAM) 50 MG tablet, Take 2 tablets (100 mg total) by mouth daily as needed., Disp: 60 tablet, Rfl: 2   Allergies  Allergen Reactions  . Iodinated Diagnostic Agents Anaphylaxis  . Codeine Other (See Comments)    Hallucinations  . Diphenhydramine Other (See Comments)    Patient states "feels like somebody is inside of me clawing me out"   Past Medical History:  Diagnosis Date  . Arthritis   . Hypertension   . Mixed hyperlipidemia 06/14/2020  . Vitamin B12 deficiency  12/06/2017    Past Surgical History:  Procedure Laterality Date  . TOTAL HIP ARTHROPLASTY Right   . TOTAL KNEE ARTHROPLASTY Bilateral   . TUBAL LIGATION      Social History   Socioeconomic History  . Marital status: Legally Separated    Spouse name: Not on file  . Number of children: 7  . Years of education: Not on file  . Highest education level: 8th grade  Occupational History  . Not on file  Tobacco Use  . Smoking status: Never Smoker  . Smokeless tobacco: Never Used  Vaping Use  . Vaping Use: Never used  Substance and Sexual Activity  . Alcohol use: No  . Drug use: No  . Sexual activity: Not Currently  Other Topics Concern  . Not on file  Social History Narrative  . Not on file   Social Determinants of Health   Financial Resource Strain: Not  on file  Food Insecurity: Not on file  Transportation Needs: Not on file  Physical Activity: Not on file  Stress: Not on file  Social Connections: Not on file  Intimate Partner Violence: Not on file        Objective:    BP (!) 156/70   Pulse 89   Temp 98.5 F (36.9 C) (Temporal)   Ht 5\' 1"  (1.549 m)   Wt 204 lb 6.4 oz (92.7 kg)   SpO2 96%   BMI 38.62 kg/m   Wt Readings from Last 3 Encounters:  09/07/20 204 lb 6.4 oz (92.7 kg)  06/08/20 206 lb 9.6 oz (93.7 kg)  05/24/20 209 lb 4 oz (94.9 kg)   Physical Exam Vitals reviewed.  Constitutional:      General: She is not in acute distress.    Appearance: Normal appearance. She is morbidly obese. She is not ill-appearing, toxic-appearing or diaphoretic.  HENT:     Head: Normocephalic and atraumatic.  Eyes:     General: No scleral icterus.       Right eye: No discharge.        Left eye: No discharge.     Conjunctiva/sclera: Conjunctivae normal.  Cardiovascular:     Rate and Rhythm: Normal rate and regular rhythm.     Heart sounds: Normal heart sounds. No murmur heard. No friction rub. No gallop.   Pulmonary:     Effort: Pulmonary effort is normal. No  respiratory distress.     Breath sounds: Normal breath sounds. No stridor. No wheezing, rhonchi or rales.  Musculoskeletal:        General: Normal range of motion.     Cervical back: Normal range of motion.     Lumbar back: Tenderness present.     Right hip: Tenderness present.  Skin:    General: Skin is warm and dry.     Capillary Refill: Capillary refill takes less than 2 seconds.     Comments: One cyst under each axilla.  Neurological:     General: No focal deficit present.     Mental Status: She is alert and oriented to person, place, and time. Mental status is at baseline.  Psychiatric:        Mood and Affect: Mood normal.        Behavior: Behavior normal.        Thought Content: Thought content normal.        Judgment: Judgment normal.    Lab Results  Component Value Date   TSH 0.536 06/27/2019   Lab Results  Component Value Date   WBC 8.1 06/08/2020   HGB 12.3 06/08/2020   HCT 37.7 06/08/2020   MCV 83 06/08/2020   PLT 254 06/08/2020   Lab Results  Component Value Date   NA 141 06/08/2020   K 4.4 06/08/2020   CO2 23 06/08/2020   GLUCOSE 98 06/08/2020   BUN 12 06/08/2020   CREATININE 0.73 06/08/2020   BILITOT 0.5 06/08/2020   ALKPHOS 83 06/08/2020   AST 14 06/08/2020   ALT 12 06/08/2020   PROT 6.9 06/08/2020   ALBUMIN 4.3 06/08/2020   CALCIUM 10.3 06/08/2020   ANIONGAP 8 07/18/2016   Lab Results  Component Value Date   CHOL 223 (H) 06/08/2020   Lab Results  Component Value Date   HDL 92 06/08/2020   Lab Results  Component Value Date   LDLCALC 118 (H) 06/08/2020   Lab Results  Component Value Date   TRIG 73 06/08/2020  Lab Results  Component Value Date   CHOLHDL 2.4 06/08/2020   Lab Results  Component Value Date   HGBA1C 5.8 06/27/2019

## 2020-09-07 NOTE — Telephone Encounter (Signed)
Patient aware and verbalized understanding. °

## 2020-09-07 NOTE — Telephone Encounter (Signed)
Medications resent

## 2020-09-07 NOTE — Telephone Encounter (Signed)
Patient was seen today.

## 2020-09-13 DIAGNOSIS — Z888 Allergy status to other drugs, medicaments and biological substances status: Secondary | ICD-10-CM | POA: Diagnosis not present

## 2020-09-13 DIAGNOSIS — Z885 Allergy status to narcotic agent status: Secondary | ICD-10-CM | POA: Diagnosis not present

## 2020-09-13 DIAGNOSIS — M25551 Pain in right hip: Secondary | ICD-10-CM | POA: Diagnosis not present

## 2020-09-13 DIAGNOSIS — M1612 Unilateral primary osteoarthritis, left hip: Secondary | ICD-10-CM | POA: Diagnosis not present

## 2020-09-13 DIAGNOSIS — Z96641 Presence of right artificial hip joint: Secondary | ICD-10-CM | POA: Diagnosis not present

## 2020-09-13 DIAGNOSIS — Z471 Aftercare following joint replacement surgery: Secondary | ICD-10-CM | POA: Diagnosis not present

## 2020-09-14 LAB — TOXASSURE SELECT 13 (MW), URINE

## 2020-09-30 ENCOUNTER — Telehealth: Payer: Self-pay | Admitting: Family Medicine

## 2020-09-30 DIAGNOSIS — M159 Polyosteoarthritis, unspecified: Secondary | ICD-10-CM

## 2020-09-30 DIAGNOSIS — M8949 Other hypertrophic osteoarthropathy, multiple sites: Secondary | ICD-10-CM

## 2020-09-30 MED ORDER — EZETIMIBE 10 MG PO TABS: 10.0000 mg | ORAL_TABLET | Freq: Every day | ORAL | 2 refills | Status: DC

## 2020-09-30 MED ORDER — TRAMADOL HCL 50 MG PO TABS: 100.0000 mg | ORAL_TABLET | Freq: Every day | ORAL | 1 refills | Status: DC | PRN

## 2020-09-30 NOTE — Telephone Encounter (Signed)
Patient states that she can not take the atorvastatin.  Forgot to tell PCP when she seen her that she has taken it before and it gives her joint pain and weakness. Patient also called about her Tramadol rx- patient aware that I have been trying to reach her and was not able to get her and she states that her phone was messed up.  Advised patient that PCP was no longer to fill Tramadol since it was absent in her drug screen and all remaining rx's were canceled at the pharmacy.  Patient states that it did not show in her UDS since she was out of it for 2-3 days and had to see use before it was refilled.  Per chart on 5/3 a nurse spoke with patient and patient states that she takes medication daily and has not missed a dose.  I asked patient if she remembers that conversation and she states yes that she was sorry but she wasn't thinking and had forgot that she had missed a few doses since she ran out of medication.  States that she is almost 80 years old and sometimes she forgets things and she was sorry she did not tell us when we called the first time.  Patient would like to know if you would continue to prescribe ? States that is the only thing she uses for pain and she needs something to help her.  Aware message will be sent to PCP to advise.  Patient also would like for you to know that she will be going out of town Friday night and would like something before she goes out of town to help her with her pain.

## 2020-09-30 NOTE — Telephone Encounter (Signed)
Patient aware and verbalizes understanding. 

## 2020-09-30 NOTE — Telephone Encounter (Signed)
Please let patient know I discontinued atorvastatin and added it to her allergy list.  I sent Zetia to take instead for her cholesterol, this is not a statin. I reviewed her PDMP and it does look like she could have ran out of her tramadol prior to our last visit, so we will try this again.

## 2020-11-11 DIAGNOSIS — R262 Difficulty in walking, not elsewhere classified: Secondary | ICD-10-CM | POA: Diagnosis not present

## 2020-11-11 DIAGNOSIS — M2569 Stiffness of other specified joint, not elsewhere classified: Secondary | ICD-10-CM | POA: Diagnosis not present

## 2020-11-11 DIAGNOSIS — R29898 Other symptoms and signs involving the musculoskeletal system: Secondary | ICD-10-CM | POA: Diagnosis not present

## 2020-11-11 DIAGNOSIS — M25551 Pain in right hip: Secondary | ICD-10-CM | POA: Diagnosis not present

## 2020-11-17 DIAGNOSIS — M25661 Stiffness of right knee, not elsewhere classified: Secondary | ICD-10-CM | POA: Diagnosis not present

## 2020-11-17 DIAGNOSIS — M25662 Stiffness of left knee, not elsewhere classified: Secondary | ICD-10-CM | POA: Diagnosis not present

## 2020-11-17 DIAGNOSIS — R262 Difficulty in walking, not elsewhere classified: Secondary | ICD-10-CM | POA: Diagnosis not present

## 2020-11-17 DIAGNOSIS — R29898 Other symptoms and signs involving the musculoskeletal system: Secondary | ICD-10-CM | POA: Diagnosis not present

## 2020-11-17 DIAGNOSIS — M2569 Stiffness of other specified joint, not elsewhere classified: Secondary | ICD-10-CM | POA: Diagnosis not present

## 2020-11-17 DIAGNOSIS — M25551 Pain in right hip: Secondary | ICD-10-CM | POA: Diagnosis not present

## 2020-11-18 DIAGNOSIS — Z20822 Contact with and (suspected) exposure to covid-19: Secondary | ICD-10-CM | POA: Diagnosis not present

## 2020-11-18 DIAGNOSIS — J329 Chronic sinusitis, unspecified: Secondary | ICD-10-CM | POA: Diagnosis not present

## 2020-11-18 DIAGNOSIS — R0989 Other specified symptoms and signs involving the circulatory and respiratory systems: Secondary | ICD-10-CM | POA: Diagnosis not present

## 2020-11-18 DIAGNOSIS — R6883 Chills (without fever): Secondary | ICD-10-CM | POA: Diagnosis not present

## 2020-11-18 DIAGNOSIS — R350 Frequency of micturition: Secondary | ICD-10-CM | POA: Diagnosis not present

## 2020-11-18 DIAGNOSIS — R52 Pain, unspecified: Secondary | ICD-10-CM | POA: Diagnosis not present

## 2020-11-25 ENCOUNTER — Other Ambulatory Visit: Payer: Self-pay | Admitting: Family Medicine

## 2020-11-25 DIAGNOSIS — I10 Essential (primary) hypertension: Secondary | ICD-10-CM

## 2020-11-29 DIAGNOSIS — M25662 Stiffness of left knee, not elsewhere classified: Secondary | ICD-10-CM | POA: Diagnosis not present

## 2020-11-29 DIAGNOSIS — R262 Difficulty in walking, not elsewhere classified: Secondary | ICD-10-CM | POA: Diagnosis not present

## 2020-11-29 DIAGNOSIS — M2569 Stiffness of other specified joint, not elsewhere classified: Secondary | ICD-10-CM | POA: Diagnosis not present

## 2020-11-29 DIAGNOSIS — R29898 Other symptoms and signs involving the musculoskeletal system: Secondary | ICD-10-CM | POA: Diagnosis not present

## 2020-11-29 DIAGNOSIS — M25551 Pain in right hip: Secondary | ICD-10-CM | POA: Diagnosis not present

## 2020-11-29 DIAGNOSIS — M25661 Stiffness of right knee, not elsewhere classified: Secondary | ICD-10-CM | POA: Diagnosis not present

## 2020-12-01 DIAGNOSIS — M2569 Stiffness of other specified joint, not elsewhere classified: Secondary | ICD-10-CM | POA: Diagnosis not present

## 2020-12-01 DIAGNOSIS — M25551 Pain in right hip: Secondary | ICD-10-CM | POA: Diagnosis not present

## 2020-12-01 DIAGNOSIS — R29898 Other symptoms and signs involving the musculoskeletal system: Secondary | ICD-10-CM | POA: Diagnosis not present

## 2020-12-01 DIAGNOSIS — M25661 Stiffness of right knee, not elsewhere classified: Secondary | ICD-10-CM | POA: Diagnosis not present

## 2020-12-01 DIAGNOSIS — R262 Difficulty in walking, not elsewhere classified: Secondary | ICD-10-CM | POA: Diagnosis not present

## 2020-12-01 DIAGNOSIS — M25662 Stiffness of left knee, not elsewhere classified: Secondary | ICD-10-CM | POA: Diagnosis not present

## 2020-12-03 ENCOUNTER — Other Ambulatory Visit: Payer: Self-pay | Admitting: Family Medicine

## 2020-12-03 DIAGNOSIS — M159 Polyosteoarthritis, unspecified: Secondary | ICD-10-CM

## 2020-12-03 DIAGNOSIS — R609 Edema, unspecified: Secondary | ICD-10-CM

## 2020-12-03 DIAGNOSIS — K219 Gastro-esophageal reflux disease without esophagitis: Secondary | ICD-10-CM

## 2020-12-03 DIAGNOSIS — M8949 Other hypertrophic osteoarthropathy, multiple sites: Secondary | ICD-10-CM

## 2020-12-07 DIAGNOSIS — R262 Difficulty in walking, not elsewhere classified: Secondary | ICD-10-CM | POA: Diagnosis not present

## 2020-12-07 DIAGNOSIS — M25661 Stiffness of right knee, not elsewhere classified: Secondary | ICD-10-CM | POA: Diagnosis not present

## 2020-12-07 DIAGNOSIS — M25551 Pain in right hip: Secondary | ICD-10-CM | POA: Diagnosis not present

## 2020-12-07 DIAGNOSIS — R29898 Other symptoms and signs involving the musculoskeletal system: Secondary | ICD-10-CM | POA: Diagnosis not present

## 2020-12-07 DIAGNOSIS — M25662 Stiffness of left knee, not elsewhere classified: Secondary | ICD-10-CM | POA: Diagnosis not present

## 2020-12-07 DIAGNOSIS — M2569 Stiffness of other specified joint, not elsewhere classified: Secondary | ICD-10-CM | POA: Diagnosis not present

## 2020-12-08 DIAGNOSIS — R52 Pain, unspecified: Secondary | ICD-10-CM | POA: Diagnosis not present

## 2020-12-08 DIAGNOSIS — J3489 Other specified disorders of nose and nasal sinuses: Secondary | ICD-10-CM | POA: Diagnosis not present

## 2020-12-08 DIAGNOSIS — J029 Acute pharyngitis, unspecified: Secondary | ICD-10-CM | POA: Diagnosis not present

## 2020-12-08 DIAGNOSIS — J329 Chronic sinusitis, unspecified: Secondary | ICD-10-CM | POA: Diagnosis not present

## 2020-12-10 ENCOUNTER — Encounter: Payer: Self-pay | Admitting: Family Medicine

## 2020-12-10 ENCOUNTER — Ambulatory Visit (INDEPENDENT_AMBULATORY_CARE_PROVIDER_SITE_OTHER): Payer: Medicare Other | Admitting: Family Medicine

## 2020-12-10 ENCOUNTER — Other Ambulatory Visit: Payer: Self-pay

## 2020-12-10 VITALS — BP 136/69 | HR 80 | Temp 97.2°F | Resp 20 | Ht 61.0 in | Wt 208.0 lb

## 2020-12-10 DIAGNOSIS — M545 Low back pain, unspecified: Secondary | ICD-10-CM | POA: Diagnosis not present

## 2020-12-10 DIAGNOSIS — M8949 Other hypertrophic osteoarthropathy, multiple sites: Secondary | ICD-10-CM

## 2020-12-10 DIAGNOSIS — K219 Gastro-esophageal reflux disease without esophagitis: Secondary | ICD-10-CM | POA: Diagnosis not present

## 2020-12-10 DIAGNOSIS — I10 Essential (primary) hypertension: Secondary | ICD-10-CM | POA: Diagnosis not present

## 2020-12-10 DIAGNOSIS — Z79899 Other long term (current) drug therapy: Secondary | ICD-10-CM

## 2020-12-10 DIAGNOSIS — M549 Dorsalgia, unspecified: Secondary | ICD-10-CM

## 2020-12-10 DIAGNOSIS — M159 Polyosteoarthritis, unspecified: Secondary | ICD-10-CM

## 2020-12-10 DIAGNOSIS — E782 Mixed hyperlipidemia: Secondary | ICD-10-CM | POA: Diagnosis not present

## 2020-12-10 LAB — URINALYSIS, COMPLETE
Bilirubin, UA: NEGATIVE
Glucose, UA: NEGATIVE
Ketones, UA: NEGATIVE
Leukocytes,UA: NEGATIVE
Nitrite, UA: NEGATIVE
Protein,UA: NEGATIVE
RBC, UA: NEGATIVE
Specific Gravity, UA: 1.01 (ref 1.005–1.030)
Urobilinogen, Ur: 0.2 mg/dL (ref 0.2–1.0)
pH, UA: 6 (ref 5.0–7.5)

## 2020-12-10 LAB — MICROSCOPIC EXAMINATION
Bacteria, UA: NONE SEEN
RBC, Urine: NONE SEEN /hpf (ref 0–2)
WBC, UA: NONE SEEN /hpf (ref 0–5)

## 2020-12-10 MED ORDER — TRAMADOL HCL 50 MG PO TABS: 100.0000 mg | ORAL_TABLET | Freq: Every day | ORAL | 2 refills | Status: DC | PRN

## 2020-12-10 NOTE — Progress Notes (Signed)
Assessment & Plan:  1-2. Primary osteoarthritis involving multiple joints/Controlled substance agreement signed Well controlled on current regimen. Controlled substance agreement in place. Urine drug screen did not show Tramadol, but patient had been out of medication. PDMP reviewed with no concerning findings. - CMP14+EGFR - traMADol (ULTRAM) 50 MG tablet; Take 2 tablets (100 mg total) by mouth daily as needed.  Dispense: 60 tablet; Refill: 2  3. Essential hypertension Well controlled on current regimen.  - CBC with Differential/Platelet - CMP14+EGFR - Lipid panel  4. Mixed hyperlipidemia Unable to tolerate statins or Zetia. - Lipid panel  5. Gastroesophageal reflux disease without esophagitis Well controlled on current regimen.  - CMP14+EGFR  6. Acute midline low back pain without sciatica - Urinalysis, Complete - Microscopic Examination   Return in about 3 months (around 03/12/2021) for annual physical.  Hendricks Limes, MSN, APRN, FNP-C [facility]  Subjective:    Patient ID: Melissa Zavala, female    DOB: [birth year], 80 y.o.   MRN: [id number]  Patient Care Team: Loman Brooklyn, FNP as PCP - General (Family Medicine) Harl Bowie Alphonse Guild, MD as PCP - Cardiology (Cardiology) Ilean China, RN as Case Manager Vela Prose, MD as Surgeon (Student)   Chief Complaint:  Chief Complaint  Patient presents with   Medical Management of Chronic Issues    3 mo     HPI: Melissa Zavala is a 80 y.o. female presenting on 12/10/2020 for Medical Management of Chronic Issues (3 mo )  Pain assessment: Cause of pain- arthritis Pain location- legs & back Pain on scale of 1-10- 7-8/10 without medication; 6/10 with medication Frequency- comes and goes What increases pain- prolonged standing What makes pain better- rest and medication Effects on ADL- none Any change in general medical condition- patient reports she has been having right hip pain that  started 2-3 weeks ago. She rates that pain 10/10 when walking. She had her right hip replaced ~2 years ago per her report.   Current opioids rx- Tramadol 100 mg daily as needed # meds rx- 60 Effectiveness of current meds- effective - states she only takes in the morning because if she takes after lunch she is unable to sleep at night Adverse reactions form pain meds- none Morphine equivalent- 10 MME/day  Pill count performed-No Last drug screen - 09/07/2020 ( high risk q41m moderate risk q648mlow risk yearly ) Urine drug screen today- No [place]  If yes were their any concerning findings? - No  Overdose risk: 140  Opioid Risk  12/02/2019  Alcohol 0  Illegal Drugs 3  Rx Drugs 4  Alcohol 0  Illegal Drugs 0  Rx Drugs 0  Age between 16-45 years  0  History of Preadolescent Sexual Abuse 3  Psychological Disease 0  Depression 0  Opioid Risk Tool Scoring 10  Opioid Risk Interpretation High Risk   Pain contract signed on: 09/14/2020   Hypertension: patient reports her average BP at home is 130-140s/60-70s. She is taking her medication regularly. Patient is not exercising.  Hyperlipidemia: Patient has previously failed treatment with statins due to pain. She was started on Zetia, which she reports also caused aching in her arms and legs.   New complaints: Patient is currently taking Doxycycline that was prescribed by urgent care for a respiratory illness.  She would like her urine checked just to make sure her back pain is not related. She reports receiving a shot for pain at urgent care  for her back which did relieve some of the pain.   Social history:  Relevant past medical, surgical, family and social history reviewed and updated as indicated. Interim medical history since our last visit reviewed.  Allergies and medications reviewed and updated.  DATA REVIEWED: CHART IN EPIC  ROS: Negative unless specifically indicated above in HPI.    Current  Outpatient Medications:    amLODipine (NORVASC) 10 MG tablet, Take 1 tablet (10 mg total) by mouth daily., Disp: 90 tablet, Rfl: 1   celecoxib (CELEBREX) 200 MG capsule, TAKE (1) CAPSULE DAILY., Disp: 30 capsule, Rfl: 0   doxycycline (VIBRAMYCIN) 100 MG capsule, Take 100 mg by mouth 2 (two) times daily., Disp: , Rfl:    esomeprazole (NEXIUM) 40 MG capsule, TAKE (1) CAPSULE DAILY., Disp: 30 capsule, Rfl: 0   ezetimibe (ZETIA) 10 MG tablet, Take 1 tablet (10 mg total) by mouth daily., Disp: 30 tablet, Rfl: 2   furosemide (LASIX) 20 MG tablet, Take 2 tablets (40 mg total) by mouth daily., Disp: 180 tablet, Rfl: 1   lisinopril (ZESTRIL) 40 MG tablet, TAKE 1 TABLET ONCE DAILY., Disp: 30 tablet, Rfl: 2   potassium chloride (KLOR-CON) 10 MEQ tablet, TAKE ONE TABLET TWICE A DAY., Disp: 60 tablet, Rfl: 0   traMADol (ULTRAM) 50 MG tablet, Take 2 tablets (100 mg total) by mouth daily as needed., Disp: 60 tablet, Rfl: 1   Allergies  Allergen Reactions   Iodinated Diagnostic Agents Anaphylaxis   Codeine Other (See Comments)    Hallucinations   Diphenhydramine Other (See Comments)    Patient states "feels like somebody is inside of me clawing me out"   Statins Other (See Comments)    Joint pains and weakness.   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   Vitamin B12 deficiency 12/06/2017    Past Surgical History:  Procedure Laterality Date   TOTAL HIP ARTHROPLASTY Right    TOTAL KNEE ARTHROPLASTY Bilateral    TUBAL LIGATION      Social History   Socioeconomic History   Marital status: Legally Separated    Spouse name: Not on file   Number of children: 7   Years of education: Not on file   Highest education level: 8th grade  Occupational History   Not on file  Tobacco Use   Smoking status: Never   Smokeless tobacco: Never  Vaping Use   Vaping Use: Never used  Substance and Sexual Activity   Alcohol use: No   Drug use: No    Sexual activity: Not Currently  Other Topics Concern   Not on file  Social History Narrative   Not on file   Social Determinants of Health   Financial Resource Strain: Not on file  Food Insecurity: Not on file  Transportation Needs: Not on file  Physical Activity: Not on file  Stress: Not on file  Social Connections: Not on file  Intimate Partner Violence: Not on file        Objective:    BP 136/69   Pulse 80   Temp (!) 97.2 F (36.2 C)   Resp 20   Ht _0  (1.549 m)   Wt 208 lb (94.3 kg)   SpO2 97%   BMI 39.30 kg/m   Wt Readings from Last 3 Encounters:  12/10/20 208 lb (94.3 kg)  09/07/20 204 lb 6.4 oz (92.7 kg)  06/08/20 206 lb 9.6 oz (93.7 kg)  Physical Exam Vitals reviewed.  Constitutional:      General: She is not in acute distress.    Appearance: Normal appearance. She is obese. She is not ill-appearing, toxic-appearing or diaphoretic.  HENT:     Head: Normocephalic and atraumatic.  Eyes:     General: No scleral icterus.       Right eye: No discharge.        Left eye: No discharge.     Conjunctiva/sclera: Conjunctivae normal.  Cardiovascular:     Rate and Rhythm: Normal rate and regular rhythm.     Heart sounds: Normal heart sounds. No murmur heard.   No friction rub. No gallop.  Pulmonary:     Effort: Pulmonary effort is normal. No respiratory distress.     Breath sounds: Normal breath sounds. No stridor. No wheezing, rhonchi or rales.  Musculoskeletal:        General: Normal range of motion.     Cervical back: Normal range of motion.     Thoracic back: Tenderness present.  Skin:    General: Skin is warm and dry.     Capillary Refill: Capillary refill takes less than 2 seconds.  Neurological:     General: No focal deficit present.     Mental Status: She is alert and oriented to person, place, and time. Mental status is at baseline.  Psychiatric:        Mood and Affect: Mood normal.        Behavior: Behavior normal.        Thought Content:  Thought content normal.        Judgment: Judgment normal.   Lab Results  Component Value Date   TSH 0.536 06/27/2019   Lab Results  Component Value Date   WBC 8.1 06/08/2020   HGB 12.3 06/08/2020   HCT 37.7 06/08/2020   MCV 83 06/08/2020   PLT 254 06/08/2020   Lab Results  Component Value Date   NA 141 06/08/2020   K 4.4 06/08/2020   CO2 23 06/08/2020   GLUCOSE 98 06/08/2020   BUN 12 06/08/2020   CREATININE 0.73 06/08/2020   BILITOT 0.5 06/08/2020   ALKPHOS 83 06/08/2020   AST 14 06/08/2020   ALT 12 06/08/2020   PROT 6.9 06/08/2020   ALBUMIN 4.3 06/08/2020   CALCIUM 10.3 06/08/2020   ANIONGAP 8 07/18/2016   Lab Results  Component Value Date   CHOL 223 (H) 06/08/2020   Lab Results  Component Value Date   HDL 92 06/08/2020   Lab Results  Component Value Date   LDLCALC 118 (H) 06/08/2020   Lab Results  Component Value Date   TRIG 73 06/08/2020   Lab Results  Component Value Date   CHOLHDL 2.4 06/08/2020   Lab Results  Component Value Date   HGBA1C 5.8 06/27/2019

## 2020-12-11 LAB — CMP14+EGFR
ALT: 10 IU/L (ref 0–32)
AST: 8 IU/L (ref 0–40)
Albumin/Globulin Ratio: 1.6 (ref 1.2–2.2)
Albumin: 4.1 g/dL (ref 3.7–4.7)
Alkaline Phosphatase: 77 IU/L (ref 44–121)
BUN/Creatinine Ratio: 23 (ref 12–28)
BUN: 13 mg/dL (ref 8–27)
Bilirubin Total: 0.2 mg/dL (ref 0.0–1.2)
CO2: 23 mmol/L (ref 20–29)
Calcium: 9.8 mg/dL (ref 8.7–10.3)
Chloride: 104 mmol/L (ref 96–106)
Creatinine, Ser: 0.57 mg/dL (ref 0.57–1.00)
Globulin, Total: 2.6 g/dL (ref 1.5–4.5)
Glucose: 98 mg/dL (ref 65–99)
Potassium: 4.3 mmol/L (ref 3.5–5.2)
Sodium: 139 mmol/L (ref 134–144)
Total Protein: 6.7 g/dL (ref 6.0–8.5)
eGFR: 92 mL/min/{1.73_m2} (ref >59)

## 2020-12-11 LAB — CBC WITH DIFFERENTIAL/PLATELET
Basophils Absolute: 0 10*3/uL (ref 0.0–0.2)
Basos: 1 %
EOS (ABSOLUTE): 0.2 10*3/uL (ref 0.0–0.4)
Eos: 2 %
Hematocrit: 32.9 % — ABNORMAL LOW (ref 34.0–46.6)
Hemoglobin: 10.8 g/dL — ABNORMAL LOW (ref 11.1–15.9)
Immature Grans (Abs): 0 10*3/uL (ref 0.0–0.1)
Immature Granulocytes: 0 %
Lymphocytes Absolute: 2.2 10*3/uL (ref 0.7–3.1)
Lymphs: 27 %
MCH: 27 pg (ref 26.6–33.0)
MCHC: 32.8 g/dL (ref 31.5–35.7)
MCV: 82 fL (ref 79–97)
Monocytes Absolute: 0.5 10*3/uL (ref 0.1–0.9)
Monocytes: 6 %
Neutrophils Absolute: 5.4 10*3/uL (ref 1.4–7.0)
Neutrophils: 64 %
Platelets: 270 10*3/uL (ref 150–450)
RBC: 4 x10E6/uL (ref 3.77–5.28)
RDW: 12.9 % (ref 11.7–15.4)
WBC: 8.4 10*3/uL (ref 3.4–10.8)

## 2020-12-11 LAB — LIPID PANEL
Chol/HDL Ratio: 2.3 ratio (ref 0.0–4.4)
Cholesterol, Total: 205 mg/dL — ABNORMAL HIGH (ref 100–199)
HDL: 88 mg/dL (ref >39)
LDL Chol Calc (NIH): 104 mg/dL — ABNORMAL HIGH (ref 0–99)
Triglycerides: 73 mg/dL (ref 0–149)
VLDL Cholesterol Cal: 13 mg/dL (ref 5–40)

## 2020-12-12 ENCOUNTER — Encounter: Payer: Self-pay | Admitting: Family Medicine

## 2020-12-13 DIAGNOSIS — M25661 Stiffness of right knee, not elsewhere classified: Secondary | ICD-10-CM | POA: Diagnosis not present

## 2020-12-13 DIAGNOSIS — M25662 Stiffness of left knee, not elsewhere classified: Secondary | ICD-10-CM | POA: Diagnosis not present

## 2020-12-13 DIAGNOSIS — M2569 Stiffness of other specified joint, not elsewhere classified: Secondary | ICD-10-CM | POA: Diagnosis not present

## 2020-12-13 DIAGNOSIS — R262 Difficulty in walking, not elsewhere classified: Secondary | ICD-10-CM | POA: Diagnosis not present

## 2020-12-13 DIAGNOSIS — R29898 Other symptoms and signs involving the musculoskeletal system: Secondary | ICD-10-CM | POA: Diagnosis not present

## 2020-12-13 DIAGNOSIS — M25551 Pain in right hip: Secondary | ICD-10-CM | POA: Diagnosis not present

## 2020-12-15 DIAGNOSIS — R29898 Other symptoms and signs involving the musculoskeletal system: Secondary | ICD-10-CM | POA: Diagnosis not present

## 2020-12-15 DIAGNOSIS — M25662 Stiffness of left knee, not elsewhere classified: Secondary | ICD-10-CM | POA: Diagnosis not present

## 2020-12-15 DIAGNOSIS — M25551 Pain in right hip: Secondary | ICD-10-CM | POA: Diagnosis not present

## 2020-12-15 DIAGNOSIS — R262 Difficulty in walking, not elsewhere classified: Secondary | ICD-10-CM | POA: Diagnosis not present

## 2020-12-15 DIAGNOSIS — M2569 Stiffness of other specified joint, not elsewhere classified: Secondary | ICD-10-CM | POA: Diagnosis not present

## 2020-12-15 DIAGNOSIS — M25661 Stiffness of right knee, not elsewhere classified: Secondary | ICD-10-CM | POA: Diagnosis not present

## 2020-12-21 ENCOUNTER — Telehealth: Payer: Self-pay

## 2020-12-21 ENCOUNTER — Ambulatory Visit (INDEPENDENT_AMBULATORY_CARE_PROVIDER_SITE_OTHER): Payer: Medicare Other

## 2020-12-21 VITALS — Ht 61.0 in | Wt 208.0 lb

## 2020-12-21 DIAGNOSIS — Z0000 Encounter for general adult medical examination without abnormal findings: Secondary | ICD-10-CM | POA: Diagnosis not present

## 2020-12-21 DIAGNOSIS — Z599 Problem related to housing and economic circumstances, unspecified: Secondary | ICD-10-CM

## 2020-12-21 NOTE — Telephone Encounter (Signed)
Needs visit to assess and address.

## 2020-12-21 NOTE — Progress Notes (Signed)
Subjective:   Melissa Zavala is a 80 y.o. female who presents for Medicare Annual (Subsequent) preventive examination.  Virtual Visit via Telephone Note  I connected with  [name] on 12/21/20 at  4:15 PM EDT by telephone and verified that I am speaking with the correct person using two identifiers.  Location: Patient: Home Provider: [facility] Persons participating in the virtual visit: patient/Nurse Health Advisor   I discussed the limitations, risks, security and privacy concerns of performing an evaluation and management service by telephone and the availability of in person appointments. The patient expressed understanding and agreed to proceed.  Interactive audio and video telecommunications were attempted between this nurse and patient, however failed, due to patient having technical difficulties OR patient did not have access to video capability.  We continued and completed visit with audio only.  Some vital signs may be absent or patient reported.   Lamyiah Crawshaw E Thera Basden, LPN   Review of Systems     Cardiac Risk Factors include: advanced age (>19mn, >>62women);obesity (BMI >30kg/m2);dyslipidemia;hypertension;sedentary lifestyle     Objective:    Today's Vitals   12/21/20 1636  Weight: 208 lb (94.3 kg)  Height: '5\' 1"'$  (1.549 m)   Body mass index is 39.3 kg/m.  Advanced Directives 12/21/2020 03/18/2020 11/19/2018 01/28/2018 01/18/2018 06/26/2017 07/18/2016  Does Patient Have a Medical Advance Directive? No No No No No No Yes  Type of Advance Directive - - - - - - HPress photographer Does patient want to make changes to medical advance directive? - - - - - - -  Copy of [place] Chart? - - - - - - -  Would patient like information on creating a medical advance directive? No - Patient declined - Yes (MAU/Ambulatory/Procedural Areas - Information given) - - - -    Current Medications (verified) Outpatient Encounter Medications as of 12/21/2020  Medication  Sig   amLODipine (NORVASC) 10 MG tablet Take 1 tablet (10 mg total) by mouth daily.   celecoxib (CELEBREX) 200 MG capsule TAKE (1) CAPSULE DAILY.   doxycycline (VIBRAMYCIN) 100 MG capsule Take 100 mg by mouth 2 (two) times daily.   esomeprazole (NEXIUM) 40 MG capsule TAKE (1) CAPSULE DAILY.   furosemide (LASIX) 20 MG tablet Take 2 tablets (40 mg total) by mouth daily.   lisinopril (ZESTRIL) 40 MG tablet TAKE 1 TABLET ONCE DAILY.   potassium chloride (KLOR-CON) 10 MEQ tablet TAKE ONE TABLET TWICE A DAY.   traMADol (ULTRAM) 50 MG tablet Take 2 tablets (100 mg total) by mouth daily as needed.   No facility-administered encounter medications on file as of 12/21/2020.    Allergies (verified) Iodinated diagnostic agents, Codeine, Diphenhydramine, Statins, and Zetia [ezetimibe]   History: 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   Vitamin B12 deficiency 12/06/2017   Past Surgical History:  Procedure Laterality Date   TOTAL HIP ARTHROPLASTY Right    TOTAL KNEE ARTHROPLASTY Bilateral    TUBAL LIGATION     Family History  Problem Relation Age of Onset   Breast cancer Mother    Social History   Socioeconomic History   Marital status: Legally Separated    Spouse name: Not on file   Number of children: 7   Years of education: Not on file   Highest education level: 8th grade  Occupational History   Not on file  Tobacco Use  Smoking status: Never   Smokeless tobacco: Never  Vaping Use   Vaping Use: Never used  Substance and Sexual Activity   Alcohol use: No   Drug use: No   Sexual activity: Not Currently  Other Topics Concern   Not on file  Social History Narrative   Lives alone - all of her children live nearby   Social Determinants of Health   Financial Resource Strain: High Risk   Difficulty of Paying Living Expenses: Hard  Food Insecurity: No Food Insecurity   Worried About Charity fundraiser in the Last  Year: Never true   Ran Out of Food in the Last Year: Never true  Transportation Needs: No Transportation Needs   Lack of Transportation (Medical): No   Lack of Transportation (Non-Medical): No  Physical Activity: Sufficiently Active   Days of Exercise per Week: 5 days   Minutes of Exercise per Session: 30 min  Stress: No Stress Concern Present   Feeling of Stress : Only a little  Social Connections: Moderately Integrated   Frequency of Communication with Friends and Family: More than three times a week   Frequency of Social Gatherings with Friends and Family: More than three times a week   Attends Religious Services: More than 4 times per year   Active Member of [employer] or Organizations: Yes   Attends Music therapist: More than 4 times per year   Marital Status: Separated    Tobacco Counseling Counseling given: Not Answered   Clinical Intake:  Pre-visit preparation completed: Yes  Pain : No/denies pain     BMI - recorded: 39.3 Nutritional Status: BMI > 30  Obese Nutritional Risks: None Diabetes: No  How often do you need to have someone help you when you read instructions, pamphlets, or other written materials from your doctor or pharmacy?: 1 - Never  Diabetic? No  Interpreter Needed?: No  Information entered by :: Nigel Ericsson, LPN   Activities of Daily Living In your present state of health, do you have any difficulty performing the following activities: 12/21/2020  Hearing? N  Vision? N  Difficulty concentrating or making decisions? Y  Walking or climbing stairs? Y  Dressing or bathing? N  Doing errands, shopping? N  Preparing Food and eating ? N  Using the Toilet? N  In the past six months, have you accidently leaked urine? N  Do you have problems with loss of bowel control? N  Managing your Medications? N  Managing your Finances? N  Housekeeping or managing your Housekeeping? N  Some recent data might be hidden    Patient Care Team: Loman Brooklyn, FNP as PCP - General (Family Medicine) Harl Bowie, Alphonse Guild, MD as PCP - Cardiology (Cardiology) Ilean China, RN as Case Manager Vela Prose, MD as Surgeon (Student)  Indicate any recent Medical Services you may have received from other than [facility] providers in the past year (date may be approximate).     Assessment:   This is a routine wellness examination for [facility].  Hearing/Vision screen Hearing Screening - Comments:: Denies hearing difficulties  Vision Screening - Comments:: Wears eyeglasses - behind on annual eye exam, but has upcoming appt with [facility]  Dietary issues and exercise activities discussed: Current Exercise Habits: Home exercise routine;Structured exercise class (PT in [facility] twice per week and home exercises 3x per week), Type of exercise: walking;strength training/weights, Time (Minutes): 30, Frequency (Times/Week): 5, Weekly Exercise (Minutes/Week): 150, Intensity: Moderate, Exercise limited by: orthopedic  condition(s)   Goals Addressed             This Visit's Progress    Exercise 3x per week (30 min per time)   On track    Try to exercise for at least 30 minutes, 3 times weekly       Depression Screen PHQ 2/9 Scores 12/21/2020 12/10/2020 06/08/2020 06/08/2020 04/02/2020 03/16/2020 03/04/2020  PHQ - 2 Score 0 0 0 0 0 0 0  PHQ- 9 Score 0 0 0 - - - -    Fall Risk Fall Risk  12/21/2020 12/10/2020 06/08/2020 04/02/2020 03/16/2020  Falls in the past year? 1 0 1 0 0  Number falls in past yr: 0 - 0 - -  Injury with Fall? 0 - 0 - -  Risk for fall due to : Orthopedic patient;Impaired vision;History of fall(s);Medication side effect - - - -  Follow up Education provided;Falls prevention discussed - Falls prevention discussed - -    FALL RISK PREVENTION PERTAINING TO THE HOME:  Any stairs in or around the home? No  If so, are there any without handrails? No  Home free of loose throw rugs in walkways, pet beds, electrical cords, etc? Yes   Adequate lighting in your home to reduce risk of falls? Yes   ASSISTIVE DEVICES UTILIZED TO PREVENT FALLS:  Life alert? No  Use of a cane, walker or w/c? No  Grab bars in the bathroom? Yes  Shower chair or bench in shower? No  Elevated toilet seat or a handicapped toilet? No   TIMED UP AND GO:  Was the test performed? No . Telephonic visit  Cognitive Function:     6CIT Screen 12/21/2020 11/19/2018  What Year? 0 points 0 points  What month? 0 points 0 points  What time? 0 points 0 points  Count back from 20 0 points 0 points  Months in reverse 0 points 0 points  Repeat phrase 4 points 2 points  Total Score 4 2    Immunizations Immunization History  Administered Date(s) Administered   Fluad Quad(high Dose 65+) 01/16/2019, 03/04/2020   Influenza Inj Mdck Quad Pf 01/16/2019   Influenza Split 02/10/2009, 03/03/2010, 04/27/2011, 02/26/2012, 05/21/2013, 04/28/2020   Influenza, High Dose Seasonal PF 06/09/2016, 02/19/2017, 02/11/2018   Influenza,trivalent, recombinat, inj, PF 02/24/2015   Influenza-Unspecified 01/16/2019   PFIZER(Purple Top)SARS-COV-2 Vaccination 06/08/2019, 06/30/2019, 01/22/2020   Pneumococcal Conjugate-13 02/17/2019   Pneumococcal Polysaccharide-23 03/09/2020   Tdap 07/18/2016   Zoster Recombinat (Shingrix) 09/02/2019    TDAP status: Up to date  Flu Vaccine status: Up to date  Pneumococcal vaccine status: Up to date  Covid-19 vaccine status: Completed vaccines  Qualifies for Shingles Vaccine? Yes   Zostavax completed No   Shingrix Completed? She had one dose and declines second as she had a poor reaction - rash all over arm, limited mobility and achy for days.  Screening Tests Health Maintenance  Topic Date Due   INFLUENZA VACCINE  12/13/2020   COVID-19 Vaccine (4 - Booster for Pfizer series) 12/26/2020 (Originally 04/22/2020)   Zoster Vaccines- Shingrix (2 of 2) 03/12/2021 (Originally 10/28/2019)   DEXA SCAN  06/08/2021 (Originally 11/07/2005)    TETANUS/TDAP  07/19/2026   PNA vac Low Risk Adult  Completed   HPV VACCINES  Aged Out    Health Maintenance  Health Maintenance Due  Topic Date Due   INFLUENZA VACCINE  12/13/2020    Colorectal cancer screening: No longer required.   Mammogram status: Completed 07/07/2020. Repeat every year  Bone Density Status: patient declines  Lung Cancer Screening: (Low Dose CT Chest recommended if Age 40-80 years, 30 pack-year currently smoking OR have quit w/in 15years.) does not qualify.   Additional Screening:  Hepatitis C Screening: does not qualify  Vision Screening: Recommended annual ophthalmology exams for early detection of glaucoma and other disorders of the eye. Is the patient up to date with their annual eye exam?  No  Who is the provider or what is the name of the office in which the patient attends annual eye exams? [place] If pt is not established with a provider, would they like to be referred to a provider to establish care? No .   Dental Screening: Recommended annual dental exams for proper oral hygiene  Community Resource Referral / Chronic Care Management: CRR required this visit?  Yes   CCM required this visit?  No      Plan:     I have personally reviewed and noted the following in the patient's chart:   Medical and social history Use of alcohol, tobacco or illicit drugs  Current medications and supplements including opioid prescriptions.  Functional ability and status Nutritional status Physical activity Advanced directives List of other physicians Hospitalizations, surgeries, and ER visits in previous 12 months Vitals Screenings to include cognitive, depression, and falls Referrals and appointments  In addition, I have reviewed and discussed with patient certain preventive protocols, quality metrics, and best practice recommendations. A written personalized care plan for preventive services as well as general preventive health recommendations  were provided to patient.     Sandrea Hammond, LPN   [id number]   Nurse Notes: Patient has barely been able to pay her bills - she gets medications for free and gets food stamps, but is worried her power is going to be turned off if she doesn't pay by 01/05/21 of this month - referral sent to [facility] Also, patient was seen at Urgent Care 7/27, given prednisone shot and antibiotic - seemed that the prednisone really helped to dry up her sinus congestion, but it is now back - see note sent to [facility].

## 2020-12-21 NOTE — Patient Instructions (Signed)
[name] , Thank you for taking time to come for your Medicare Wellness Visit. I appreciate your ongoing commitment to your health goals. Please review the following plan we discussed and let me know if I can assist you in the future.   Screening recommendations/referrals: Colonoscopy: Done 05/02/2016 - Repeat not required Mammogram: done 07/07/2020 - Repeat annually Bone Density: Due Recommended yearly ophthalmology/optometry visit for glaucoma screening and checkup Recommended yearly dental visit for hygiene and checkup  Vaccinations: Influenza vaccine: Done 03/04/2020 - Repeat annually Pneumococcal vaccine: Done 02/17/2019 & 03/09/2020 Tdap vaccine: Done 07/18/2016 - Repeat in 10 years Shingles vaccine: One dose done 09/02/2019 - bad reaction - declines 2nd dose   Covid-19: Done 06/08/19, 06/30/19, & 01/22/20  Advanced directives: Please bring a copy of your health care power of attorney and living will to the office to be added to your chart at your convenience.   Conditions/risks identified: Aim for 30 minutes of exercise or brisk walking each day, drink 6-8 glasses of water and eat lots of fruits and vegetables.   Next appointment: Follow up in one year for your annual wellness visit    Preventive Care 65 Years and Older, Female Preventive care refers to lifestyle choices and visits with your health care provider that can promote health and wellness. What does preventive care include? A yearly physical exam. This is also called an annual well check. Dental exams once or twice a year. Routine eye exams. Ask your health care provider how often you should have your eyes checked. Personal lifestyle choices, including: Daily care of your teeth and gums. Regular physical activity. Eating a healthy diet. Avoiding tobacco and drug use. Limiting alcohol use. Practicing safe sex. Taking low-dose aspirin every day. Taking vitamin and mineral supplements as recommended by your health care  provider. What happens during an annual well check? The services and screenings done by your health care provider during your annual well check will depend on your age, overall health, lifestyle risk factors, and family history of disease. Counseling  Your health care provider may ask you questions about your: Alcohol use. Tobacco use. Drug use. Emotional well-being. Home and relationship well-being. Sexual activity. Eating habits. History of falls. Memory and ability to understand (cognition). Work and work Statistician. Reproductive health. Screening  You may have the following tests or measurements: Height, weight, and BMI. Blood pressure. Lipid and cholesterol levels. These may be checked every 5 years, or more frequently if you are over 91 years old. Skin check. Lung cancer screening. You may have this screening every year starting at age 76 if you have a 30-pack-year history of smoking and currently smoke or have quit within the past 15 years. Fecal occult blood test (FOBT) of the stool. You may have this test every year starting at age 22. Flexible sigmoidoscopy or colonoscopy. You may have a sigmoidoscopy every 5 years or a colonoscopy every 10 years starting at age 26. Hepatitis C blood test. Hepatitis B blood test. Sexually transmitted disease (STD) testing. Diabetes screening. This is done by checking your blood sugar (glucose) after you have not eaten for a while (fasting). You may have this done every 1-3 years. Bone density scan. This is done to screen for osteoporosis. You may have this done starting at age 52. Mammogram. This may be done every 1-2 years. Talk to your health care provider about how often you should have regular mammograms. Talk with your health care provider about your test results, treatment options, and if  necessary, the need for more tests. Vaccines  Your health care provider may recommend certain vaccines, such as: Influenza vaccine. This is  recommended every year. Tetanus, diphtheria, and acellular pertussis (Tdap, Td) vaccine. You may need a Td booster every 10 years. Zoster vaccine. You may need this after age 53. Pneumococcal 13-valent conjugate (PCV13) vaccine. One dose is recommended after age 79. Pneumococcal polysaccharide (PPSV23) vaccine. One dose is recommended after age 93. Talk to your health care provider about which screenings and vaccines you need and how often you need them. This information is not intended to replace advice given to you by your health care provider. Make sure you discuss any questions you have with your health care provider. Document Released: 05/28/2015 Document Revised: 01/19/2016 Document Reviewed: 03/02/2015 Elsevier Interactive Patient Education  2017 [place] Prevention in the Home Falls can cause injuries. They can happen to people of all ages. There are many things you can do to make your home safe and to help prevent falls. What can I do on the outside of my home? Regularly fix the edges of walkways and driveways and fix any cracks. Remove anything that might make you trip as you walk through a door, such as a raised step or threshold. Trim any bushes or trees on the path to your home. Use bright outdoor lighting. Clear any walking paths of anything that might make someone trip, such as rocks or tools. Regularly check to see if handrails are loose or broken. Make sure that both sides of any steps have handrails. Any raised decks and porches should have guardrails on the edges. Have any leaves, snow, or ice cleared regularly. Use sand or salt on walking paths during winter. Clean up any spills in your garage right away. This includes oil or grease spills. What can I do in the bathroom? Use night lights. Install grab bars by the toilet and in the tub and shower. Do not use towel bars as grab bars. Use non-skid mats or decals in the tub or shower. If you need to sit down in  the shower, use a plastic, non-slip stool. Keep the floor dry. Clean up any water that spills on the floor as soon as it happens. Remove soap buildup in the tub or shower regularly. Attach bath mats securely with double-sided non-slip rug tape. Do not have throw rugs and other things on the floor that can make you trip. What can I do in the bedroom? Use night lights. Make sure that you have a light by your bed that is easy to reach. Do not use any sheets or blankets that are too big for your bed. They should not hang down onto the floor. Have a firm chair that has side arms. You can use this for support while you get dressed. Do not have throw rugs and other things on the floor that can make you trip. What can I do in the kitchen? Clean up any spills right away. Avoid walking on wet floors. Keep items that you use a lot in easy-to-reach places. If you need to reach something above you, use a strong step stool that has a grab bar. Keep electrical cords out of the way. Do not use floor polish or wax that makes floors slippery. If you must use wax, use non-skid floor wax. Do not have throw rugs and other things on the floor that can make you trip. What can I do with my stairs? Do not leave any items  on the stairs. Make sure that there are handrails on both sides of the stairs and use them. Fix handrails that are broken or loose. Make sure that handrails are as long as the stairways. Check any carpeting to make sure that it is firmly attached to the stairs. Fix any carpet that is loose or worn. Avoid having throw rugs at the top or bottom of the stairs. If you do have throw rugs, attach them to the floor with carpet tape. Make sure that you have a light switch at the top of the stairs and the bottom of the stairs. If you do not have them, ask someone to add them for you. What else can I do to help prevent falls? Wear shoes that: Do not have high heels. Have rubber bottoms. Are comfortable  and fit you well. Are closed at the toe. Do not wear sandals. If you use a stepladder: Make sure that it is fully opened. Do not climb a closed stepladder. Make sure that both sides of the stepladder are locked into place. Ask someone to hold it for you, if possible. Clearly mark and make sure that you can see: Any grab bars or handrails. First and last steps. Where the edge of each step is. Use tools that help you move around (mobility aids) if they are needed. These include: Canes. Walkers. Scooters. Crutches. Turn on the lights when you go into a dark area. Replace any light bulbs as soon as they burn out. Set up your furniture so you have a clear path. Avoid moving your furniture around. If any of your floors are uneven, fix them. If there are any pets around you, be aware of where they are. Review your medicines with your doctor. Some medicines can make you feel dizzy. This can increase your chance of falling. Ask your doctor what other things that you can do to help prevent falls. This information is not intended to replace advice given to you by your health care provider. Make sure you discuss any questions you have with your health care provider. Document Released: 02/25/2009 Document Revised: 10/07/2015 Document Reviewed: 06/05/2014 Elsevier Interactive Patient Education  2017 [employer].

## 2020-12-22 ENCOUNTER — Telehealth: Payer: Self-pay | Admitting: Family Medicine

## 2020-12-22 DIAGNOSIS — M2569 Stiffness of other specified joint, not elsewhere classified: Secondary | ICD-10-CM | POA: Diagnosis not present

## 2020-12-22 DIAGNOSIS — R29898 Other symptoms and signs involving the musculoskeletal system: Secondary | ICD-10-CM | POA: Diagnosis not present

## 2020-12-22 DIAGNOSIS — M25551 Pain in right hip: Secondary | ICD-10-CM | POA: Diagnosis not present

## 2020-12-22 DIAGNOSIS — M25661 Stiffness of right knee, not elsewhere classified: Secondary | ICD-10-CM | POA: Diagnosis not present

## 2020-12-22 DIAGNOSIS — R262 Difficulty in walking, not elsewhere classified: Secondary | ICD-10-CM | POA: Diagnosis not present

## 2020-12-22 DIAGNOSIS — M25662 Stiffness of left knee, not elsewhere classified: Secondary | ICD-10-CM | POA: Diagnosis not present

## 2020-12-22 NOTE — Telephone Encounter (Signed)
Patient needs appointment  First opening  Two or more symptoms patient needs to be in covid clinic

## 2020-12-22 NOTE — Telephone Encounter (Signed)
Televisit made for 12/23/20

## 2020-12-22 NOTE — Telephone Encounter (Signed)
   Telephone encounter was:  Successful.  12/22/2020 Name: Melissa Zavala MRN: [id number] DOB: [birth year]  Melissa Zavala is a 80 y.o. year old female who is a primary care patient of Loman Brooklyn, FNP . The community resource team was consulted for assistance with Financial Difficulties related to paying electric bill.  Care guide performed the following interventions: Patient provided with information about care guide support team and interviewed to confirm resource needs She received a letter stating her electric will be cut off if not paid by 01/05/21. I advised I will research programs in [place] to see if there are any funds available to assist.  .  Follow Up Plan:  Care guide will follow up with patient by phone over the next few days.   April Green Care Guide, Embedded Care Coordination [place], Care Management Phone: [phone] Email: [email or web address]

## 2020-12-23 ENCOUNTER — Ambulatory Visit (INDEPENDENT_AMBULATORY_CARE_PROVIDER_SITE_OTHER): Payer: Medicare Other | Admitting: Family Medicine

## 2020-12-23 ENCOUNTER — Encounter: Payer: Self-pay | Admitting: Family Medicine

## 2020-12-23 DIAGNOSIS — B379 Candidiasis, unspecified: Secondary | ICD-10-CM

## 2020-12-23 DIAGNOSIS — T3695XA Adverse effect of unspecified systemic antibiotic, initial encounter: Secondary | ICD-10-CM

## 2020-12-23 DIAGNOSIS — J302 Other seasonal allergic rhinitis: Secondary | ICD-10-CM

## 2020-12-23 MED ORDER — FLUCONAZOLE 150 MG PO TABS: 150.0000 mg | ORAL_TABLET | Freq: Once | ORAL | 0 refills | Status: AC

## 2020-12-23 MED ORDER — FLUTICASONE PROPIONATE 50 MCG/ACT NA SUSP: 2.0000 | Freq: Every day | NASAL | 2 refills | Status: DC

## 2020-12-23 MED ORDER — LEVOCETIRIZINE DIHYDROCHLORIDE 5 MG PO TABS: 5.0000 mg | ORAL_TABLET | Freq: Every evening | ORAL | 2 refills | Status: DC

## 2020-12-23 NOTE — Progress Notes (Signed)
Virtual Visit via Telephone Note  I connected with Melissa Zavala on 12/23/20 at 11:16 AM by telephone and verified that I am speaking with the correct person using two identifiers. Melissa Zavala is currently located at home and nobody is currently with her during this visit. The provider, Loman Brooklyn, FNP is located in their office at time of visit.  I discussed the limitations, risks, security and privacy concerns of performing an evaluation and management service by telephone and the availability of in person appointments. I also discussed with the patient that there may be a patient responsible charge related to this service. The patient expressed understanding and agreed to proceed.  Subjective: PCP: Loman Brooklyn, FNP  Chief Complaint  Patient presents with   URI   Patient complains of head/chest congestion, headache, runny nose, sore throat, facial pain/pressure, and postnasal drainage. Onset of symptoms was several weeks ago, unchanged since that time. She is drinking plenty of fluids. Evaluation to date: seen previously and treated for sinusitis twice in the past several weeks. Treatment to date:  warm salt water gargles . She does not smoke.    ROS: Per HPI  Current Outpatient Medications:    amLODipine (NORVASC) 10 MG tablet, Take 1 tablet (10 mg total) by mouth daily., Disp: 90 tablet, Rfl: 1   celecoxib (CELEBREX) 200 MG capsule, TAKE (1) CAPSULE DAILY., Disp: 30 capsule, Rfl: 0   doxycycline (VIBRAMYCIN) 100 MG capsule, Take 100 mg by mouth 2 (two) times daily., Disp: , Rfl:    esomeprazole (NEXIUM) 40 MG capsule, TAKE (1) CAPSULE DAILY., Disp: 30 capsule, Rfl: 0   furosemide (LASIX) 20 MG tablet, Take 2 tablets (40 mg total) by mouth daily., Disp: 180 tablet, Rfl: 1   lisinopril (ZESTRIL) 40 MG tablet, TAKE 1 TABLET ONCE DAILY., Disp: 30 tablet, Rfl: 2   potassium chloride (KLOR-CON) 10 MEQ tablet, TAKE ONE TABLET TWICE A DAY., Disp: 60 tablet, Rfl: 0   traMADol  (ULTRAM) 50 MG tablet, Take 2 tablets (100 mg total) by mouth daily as needed., Disp: 60 tablet, Rfl: 2  Allergies  Allergen Reactions   Iodinated Diagnostic Agents Anaphylaxis   Codeine Other (See Comments)    Hallucinations   Diphenhydramine Other (See Comments)    Patient states "feels like somebody is inside of me clawing me out"   Statins Other (See Comments)    Joint pains and weakness.   Zetia [Ezetimibe] Other (See Comments)    Aching in arms and legs.   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   Vitamin B12 deficiency 12/06/2017    Observations/Objective: A&O  No respiratory distress or wheezing audible over the phone Mood, judgement, and thought processes all WNL  Assessment and Plan: 1. Seasonal allergies Encouraged patient to take daily allergy medications as I do not think she has an actual sinus infection since she has been on two different rounds of antibiotics and steroids without improvement in her symptoms. - levocetirizine (XYZAL) 5 MG tablet; Take 1 tablet (5 mg total) by mouth every evening.  Dispense: 30 tablet; Refill: 2 - fluticasone (FLONASE) 50 MCG/ACT nasal spray; Place 2 sprays into both nostrils daily.  Dispense: 16 g; Refill: 2  2. Antibiotic-induced yeast infection - fluconazole (DIFLUCAN) 150 MG tablet; Take 1 tablet (150 mg total) by mouth once for 1 dose.  Dispense: 1 tablet; Refill: 0   Follow Up Instructions:  I  discussed the assessment and treatment plan with the patient. The patient was provided an opportunity to ask questions and all were answered. The patient agreed with the plan and demonstrated an understanding of the instructions.   The patient was advised to call back or seek an in-person evaluation if the symptoms worsen or if the condition fails to improve as anticipated.  The above assessment and management plan was discussed with the patient. The patient verbalized  understanding of and has agreed to the management plan. Patient is aware to call the clinic if symptoms persist or worsen. Patient is aware when to return to the clinic for a follow-up visit. Patient educated on when it is appropriate to go to the emergency department.   Time call ended: 11:27 Am  I provided 11 minutes of non-face-to-face time during this encounter.  Hendricks Limes, MSN, APRN, FNP-C [facility] 12/23/20

## 2020-12-25 ENCOUNTER — Other Ambulatory Visit: Payer: Self-pay | Admitting: Family Medicine

## 2020-12-25 DIAGNOSIS — I10 Essential (primary) hypertension: Secondary | ICD-10-CM

## 2021-01-25 DIAGNOSIS — R109 Unspecified abdominal pain: Secondary | ICD-10-CM | POA: Diagnosis not present

## 2021-01-25 DIAGNOSIS — N3001 Acute cystitis with hematuria: Secondary | ICD-10-CM | POA: Diagnosis not present

## 2021-02-01 DIAGNOSIS — M1611 Unilateral primary osteoarthritis, right hip: Secondary | ICD-10-CM | POA: Diagnosis not present

## 2021-02-01 DIAGNOSIS — M169 Osteoarthritis of hip, unspecified: Secondary | ICD-10-CM | POA: Diagnosis not present

## 2021-02-01 DIAGNOSIS — I10 Essential (primary) hypertension: Secondary | ICD-10-CM | POA: Diagnosis not present

## 2021-02-01 DIAGNOSIS — M47816 Spondylosis without myelopathy or radiculopathy, lumbar region: Secondary | ICD-10-CM | POA: Diagnosis not present

## 2021-02-01 DIAGNOSIS — M545 Low back pain, unspecified: Secondary | ICD-10-CM | POA: Diagnosis not present

## 2021-02-01 DIAGNOSIS — M1612 Unilateral primary osteoarthritis, left hip: Secondary | ICD-10-CM | POA: Diagnosis not present

## 2021-02-01 DIAGNOSIS — M5136 Other intervertebral disc degeneration, lumbar region: Secondary | ICD-10-CM | POA: Diagnosis not present

## 2021-02-01 DIAGNOSIS — Z96641 Presence of right artificial hip joint: Secondary | ICD-10-CM | POA: Diagnosis not present

## 2021-02-03 ENCOUNTER — Telehealth: Payer: Self-pay | Admitting: Family Medicine

## 2021-02-03 NOTE — Telephone Encounter (Signed)
   Telephone encounter was:  Successful.  02/03/2021 Name: Melissa Zavala MRN: [id number] DOB: 26-Sep-1940  Melissa Zavala is a 80 y.o. year old female who is a primary care patient of Loman Brooklyn, FNP . The community resource team was consulted for assistance with Financial Difficulties related to paying electric bill.  Care guide performed the following interventions: Follow up call placed to the patient to discuss status of referral. Pt was able to pay last month electric bill but has a current bill of $120.00. I gave her the number to [place] [facility], she will call and see if they can assist her.   Follow Up Plan:  No further follow up planned at this time. The patient has been provided with needed resources.  April Green Care Guide, Embedded Care Coordination , Care Management Phone: [phone] Email: [email or web address]

## 2021-02-05 ENCOUNTER — Other Ambulatory Visit: Payer: Self-pay | Admitting: Family Medicine

## 2021-02-05 DIAGNOSIS — K219 Gastro-esophageal reflux disease without esophagitis: Secondary | ICD-10-CM

## 2021-02-09 ENCOUNTER — Telehealth: Payer: Self-pay | Admitting: Family Medicine

## 2021-02-09 NOTE — Telephone Encounter (Signed)
  Prescription Request  02/09/2021  Is this a "Controlled Substance" medicine? no Have you seen your PCP in the last 2 weeks? no If YES, route message to pool  -  If NO, patient needs to be scheduled for appointment.  What is the name of the medication or equipment? Esomeprazole  Have you contacted your pharmacy to request a refill? yes  Which pharmacy would you like this sent to? [facility]   Patient notified that their request is being sent to the clinical staff for review and that they should receive a response within 2 business days.    Joyce's pt  Please call pt.

## 2021-02-09 NOTE — Telephone Encounter (Signed)
LMOVM refills was sent to pharmacy on Mon 02/07/21

## 2021-03-01 ENCOUNTER — Ambulatory Visit (INDEPENDENT_AMBULATORY_CARE_PROVIDER_SITE_OTHER): Payer: Medicare Other | Admitting: Family Medicine

## 2021-03-01 ENCOUNTER — Other Ambulatory Visit: Payer: Self-pay

## 2021-03-01 ENCOUNTER — Ambulatory Visit (INDEPENDENT_AMBULATORY_CARE_PROVIDER_SITE_OTHER): Payer: Medicare Other

## 2021-03-01 ENCOUNTER — Encounter: Payer: Self-pay | Admitting: Family Medicine

## 2021-03-01 VITALS — BP 135/72 | HR 89 | Temp 98.3°F | Ht 61.0 in | Wt 207.4 lb

## 2021-03-01 DIAGNOSIS — K219 Gastro-esophageal reflux disease without esophagitis: Secondary | ICD-10-CM | POA: Diagnosis not present

## 2021-03-01 DIAGNOSIS — M159 Polyosteoarthritis, unspecified: Secondary | ICD-10-CM | POA: Diagnosis not present

## 2021-03-01 DIAGNOSIS — R609 Edema, unspecified: Secondary | ICD-10-CM

## 2021-03-01 DIAGNOSIS — I10 Essential (primary) hypertension: Secondary | ICD-10-CM | POA: Diagnosis not present

## 2021-03-01 DIAGNOSIS — Z780 Asymptomatic menopausal state: Secondary | ICD-10-CM

## 2021-03-01 DIAGNOSIS — Z7189 Other specified counseling: Secondary | ICD-10-CM

## 2021-03-01 DIAGNOSIS — Z23 Encounter for immunization: Secondary | ICD-10-CM

## 2021-03-01 DIAGNOSIS — M810 Age-related osteoporosis without current pathological fracture: Secondary | ICD-10-CM

## 2021-03-01 DIAGNOSIS — J302 Other seasonal allergic rhinitis: Secondary | ICD-10-CM | POA: Diagnosis not present

## 2021-03-01 DIAGNOSIS — E782 Mixed hyperlipidemia: Secondary | ICD-10-CM

## 2021-03-01 DIAGNOSIS — Z79899 Other long term (current) drug therapy: Secondary | ICD-10-CM | POA: Diagnosis not present

## 2021-03-01 DIAGNOSIS — M150 Primary generalized (osteo)arthritis: Secondary | ICD-10-CM

## 2021-03-01 DIAGNOSIS — Z0001 Encounter for general adult medical examination with abnormal findings: Secondary | ICD-10-CM | POA: Diagnosis not present

## 2021-03-01 DIAGNOSIS — Z0000 Encounter for general adult medical examination without abnormal findings: Secondary | ICD-10-CM

## 2021-03-01 MED ORDER — HYDROCODONE-ACETAMINOPHEN 5-325 MG PO TABS: 1.0000 | ORAL_TABLET | Freq: Three times a day (TID) | ORAL | 0 refills | Status: DC | PRN

## 2021-03-01 MED ORDER — ESOMEPRAZOLE MAGNESIUM 40 MG PO CPDR: DELAYED_RELEASE_CAPSULE | ORAL | 1 refills | Status: DC

## 2021-03-01 MED ORDER — LISINOPRIL 40 MG PO TABS: 40.0000 mg | ORAL_TABLET | Freq: Every day | ORAL | 1 refills | Status: DC

## 2021-03-01 MED ORDER — AMLODIPINE BESYLATE 10 MG PO TABS: 10.0000 mg | ORAL_TABLET | Freq: Every day | ORAL | 1 refills | Status: DC

## 2021-03-01 MED ORDER — LEVOCETIRIZINE DIHYDROCHLORIDE 5 MG PO TABS: 5.0000 mg | ORAL_TABLET | Freq: Every evening | ORAL | 1 refills | Status: DC

## 2021-03-01 MED ORDER — FUROSEMIDE 20 MG PO TABS: 40.0000 mg | ORAL_TABLET | Freq: Every day | ORAL | 1 refills | Status: DC

## 2021-03-01 MED ORDER — POTASSIUM CHLORIDE ER 10 MEQ PO TBCR: 10.0000 meq | EXTENDED_RELEASE_TABLET | Freq: Two times a day (BID) | ORAL | 1 refills | Status: DC

## 2021-03-01 MED ORDER — CELECOXIB 200 MG PO CAPS: ORAL_CAPSULE | ORAL | 1 refills | Status: DC

## 2021-03-01 NOTE — Patient Instructions (Signed)
Try the red yeast rice for your cholesterol.

## 2021-03-01 NOTE — Progress Notes (Signed)
Assessment & Plan:  1. Well adult exam Preventive health education provided. - CBC with Differential/Platelet - CMP14+EGFR - Lipid panel  2. ACP (advance care planning) Information and forms provided for patient to complete.  3. Essential hypertension Well controlled on current regimen.  - amLODipine (NORVASC) 10 MG tablet; Take 1 tablet (10 mg total) by mouth daily.  Dispense: 90 tablet; Refill: 1 - lisinopril (ZESTRIL) 40 MG tablet; Take 1 tablet (40 mg total) by mouth daily.  Dispense: 90 tablet; Refill: 1 - CBC with Differential/Platelet - CMP14+EGFR - Lipid panel  4. Mixed hyperlipidemia Encouraged to try red yeast rice. Education provided on hyperlipidemia. - Lipid panel  5-6. Primary osteoarthritis involving multiple joints/Controlled substance agreement signed Tramadol works okay, but does not control pain the best. Switching from Tramadol to Norco. Discussed she does not have to take the medication every 8 hours, but to take it if she needs it. Discussed her bone-on-bone in the left hip - she has an appointment with her orthopedic next week that did her right hip replacement and she will discuss it with him then. I did print off the x-ray report for her to have with her.Controlled substance agreement in place. Urine drug screen within the past year. PDMP reviewed with no concerning findings.  - celecoxib (CELEBREX) 200 MG capsule; 1 tablet daily  Dispense: 90 capsule; Refill: 1 - CMP14+EGFR - HYDROcodone-acetaminophen (NORCO) 5-325 MG tablet; Take 1 tablet by mouth every 8 (eight) hours as needed for moderate pain.  Dispense: 90 tablet; Refill: 0 - HYDROcodone-acetaminophen (NORCO) 5-325 MG tablet; Take 1 tablet by mouth every 8 (eight) hours as needed for moderate pain.  Dispense: 90 tablet; Refill: 0 - HYDROcodone-acetaminophen (NORCO) 5-325 MG tablet; Take 1 tablet by mouth every 8 (eight) hours as needed for moderate pain.  Dispense: 90 tablet; Refill: 0  7.  Gastroesophageal reflux disease without esophagitis Well controlled on current regimen.  - esomeprazole (NEXIUM) 40 MG capsule; 1 tablet daily  Dispense: 90 capsule; Refill: 1 - CMP14+EGFR  8. Edema, unspecified type Well controlled on current regimen.  - furosemide (LASIX) 20 MG tablet; Take 2 tablets (40 mg total) by mouth daily.  Dispense: 180 tablet; Refill: 1 - potassium chloride (KLOR-CON) 10 MEQ tablet; Take 1 tablet (10 mEq total) by mouth 2 (two) times daily.  Dispense: 180 tablet; Refill: 1 - CMP14+EGFR  9. Seasonal allergies Well controlled on current regimen.  - levocetirizine (XYZAL) 5 MG tablet; Take 1 tablet (5 mg total) by mouth every evening.  Dispense: 90 tablet; Refill: 1  10-11. Age-related osteoporosis without current pathological fracture/Postmenopausal estrogen deficiency Continue calcium and vitamin D. DEXA scan completed today. Patient is agreeable to a medication to treat osteoporosis if needed. - CALCIUM PO; Take by mouth daily. - VITAMIN D PO; Take by mouth daily. - DG [facility] DEXA  12. Need for immunization against influenza - Flu Vaccine QUAD High Dose(Fluad)   Follow-up: Return in about 3 months (around 06/01/2021) for follow-up of chronic medication conditions.   Hendricks Limes, MSN, APRN, FNP-C [facility]  Subjective:  Patient ID: Melissa Zavala, female    DOB: 10/20/1940  Age: 80 y.o. MRN: [id number]  Patient Care Team: Loman Brooklyn, FNP as PCP - General (Family Medicine) Harl Bowie Alphonse Guild, MD as PCP - Cardiology (Cardiology) Ilean China, RN as Case Manager Vela Prose, MD as Surgeon (Student)   CC:  Chief Complaint  Patient presents with   Annual Exam   Hip  Pain    Left - ongoing but got worse yesterday     HPI Melissa Zavala presents for her annual physical.  Occupation: Retired, Marital status: Divorced, Substance use: None Diet: Has been eating healthier x1 month, Exercise: walking Last eye exam:  last month Last dental exam: does not go as she has dentures DEXA: unknown - agreeable in getting done today Immunizations: Flu Vaccine:  will get today Tdap Vaccine: up to date  Shingrix Vaccine: has received one dose; declined 2nd d/t reaction from the first COVID-19 Vaccine: up to date Pneumonia Vaccine: up to date  Advanced Directives Patient does not have advanced directives including DNR, living will, healthcare power of attorney, financial power of attorney, and MOST form. She is interested in information about this.  DEPRESSION SCREENING PHQ 2/9 Scores 03/01/2021 12/21/2020 12/10/2020 06/08/2020 06/08/2020 04/02/2020 03/16/2020  PHQ - 2 Score 0 0 0 0 0 0 0  PHQ- 9 Score 0 0 0 0 - - -     Hyperlipidemia Intolerant of statins and Zetia. Encouraged after labs resulted at her last visit to try red yeast rice. She does have some at home, but has not yet started taking it.   Osteoporosis Patient is taking a calcium and vitamin D supplement since she was seen in the ER a month ago. She is not on a medication to treat osteoporosis. We do not have records from a previous DEXA scan, but she is agreeable in completing this today.   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 Moderate facet joint arthropathy in the mid to lower lumbar spine. 02/01/2021 XR pelvis and right hip: intact right hip arthroplasty; severe osteoarthritis left hip with bone-on-bone subchondral sclerosis and mild degenerative spurring. Pain location- hips, back, and legs Pain on scale of 1-10- 7-8/10 without medication Frequency- daily What increases pain- prolonged standing What makes pain better- rest and medication Effects on ADL- none Any change in general medical condition- none  At our last visit she was referred to physical therapy for right hip pain, which she reports was very helpful.  Current opioids rx- Tramadol 100 mg daily as needed # meds  rx- 60 Effectiveness of current meds- somewhat effective in the mornings. She is unable to take after lunch because it will keep her up at night. When she went to the ER on 02/01/2021 they gave her Norco to take x3 days and told her to stop her Tramadol during this time. She states her pain has never been better controlled as when she was taking the Norco. Also states she was able to take it after lunch and still sleep that night.  Adverse reactions from pain meds- keeps her awake at night if she takes it after lunch time Morphine equivalent- 10 MME/day  Pill count performed-No Last drug screen - 09/07/2020 ( high risk q58m moderate risk q672mlow risk yearly ) Urine drug screen today- No Was the NCL[place]oneviewed- Yes If yes were their any concerning findings? - No  Overdose risk: 180  Opioid Risk  12/02/2019  Alcohol 0  Illegal Drugs 3  Rx Drugs 4  Alcohol 0  Illegal Drugs 0  Rx Drugs 0  Age between 16-45 years  0  History of Preadolescent Sexual Abuse 3  Psychological Disease 0  Depression 0  Opioid Risk Tool Scoring 10  Opioid Risk Interpretation High Risk   Pain contract signed on: 09/14/2020   Review of Systems  Constitutional:  Negative for chills, fever, malaise/fatigue and weight loss.  HENT:  Negative for congestion, ear discharge, ear pain, nosebleeds, sinus pain, sore throat and tinnitus.   Eyes:  Negative for blurred vision, double vision, pain, discharge and redness.  Respiratory:  Negative for cough, shortness of breath and wheezing.   Cardiovascular:  Negative for chest pain, palpitations and leg swelling.  Gastrointestinal:  Negative for abdominal pain, constipation, diarrhea, heartburn, nausea and vomiting.  Genitourinary:  Negative for dysuria, frequency and urgency.  Musculoskeletal:  Positive for joint pain. Negative for myalgias.  Skin:  Negative for rash.  Neurological:  Negative for dizziness, seizures, weakness and headaches.  Psychiatric/Behavioral:   Negative for depression, substance abuse and suicidal ideas. The patient is not nervous/anxious.     Current Outpatient Medications:    amLODipine (NORVASC) 10 MG tablet, TAKE 1 TABLET ONCE DAILY., Disp: 30 tablet, Rfl: 1   celecoxib (CELEBREX) 200 MG capsule, TAKE (1) CAPSULE DAILY., Disp: 30 capsule, Rfl: 0   esomeprazole (NEXIUM) 40 MG capsule, TAKE (1) CAPSULE DAILY., Disp: 30 capsule, Rfl: 0   fluticasone (FLONASE) 50 MCG/ACT nasal spray, Place 2 sprays into both nostrils daily., Disp: 16 g, Rfl: 2   furosemide (LASIX) 20 MG tablet, Take 2 tablets (40 mg total) by mouth daily., Disp: 180 tablet, Rfl: 1   levocetirizine (XYZAL) 5 MG tablet, Take 1 tablet (5 mg total) by mouth every evening., Disp: 30 tablet, Rfl: 2   lisinopril (ZESTRIL) 40 MG tablet, TAKE 1 TABLET ONCE DAILY., Disp: 30 tablet, Rfl: 2   potassium chloride (KLOR-CON) 10 MEQ tablet, TAKE ONE TABLET TWICE A DAY., Disp: 60 tablet, Rfl: 0   traMADol (ULTRAM) 50 MG tablet, Take 2 tablets (100 mg total) by mouth daily as needed., Disp: 60 tablet, Rfl: 2  Allergies  Allergen Reactions   Iodinated Diagnostic Agents Anaphylaxis   Codeine Other (See Comments)    Hallucinations   Diphenhydramine Other (See Comments)    Patient states "feels like somebody is inside of me clawing me out"   Statins Other (See Comments)    Joint pains and weakness.   Zetia [Ezetimibe] Other (See Comments)    Aching in arms and legs.    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   Vitamin B12 deficiency 12/06/2017    Past Surgical History:  Procedure Laterality Date   TOTAL HIP ARTHROPLASTY Right    TOTAL KNEE ARTHROPLASTY Bilateral    TUBAL LIGATION      Family History  Problem Relation Age of Onset   Breast cancer Mother     Social History   Socioeconomic History   Marital status: Legally Separated    Spouse name: Not on file   Number of children: 7   Years of  education: Not on file   Highest education level: 8th grade  Occupational History   Not on file  Tobacco Use   Smoking status: Never   Smokeless tobacco: Never  Vaping Use   Vaping Use: Never used  Substance and Sexual Activity   Alcohol use: No   Drug use: No   Sexual activity: Not Currently  Other Topics Concern   Not on file  Social History Narrative   Lives alone - all of her children live nearby   Social Determinants of Health   Financial Resource Strain: High Risk   Difficulty of Paying Living Expenses: Hard  Food Insecurity: No Food Insecurity  Worried About Charity fundraiser in the Last Year: Never true   [place] in the Last Year: Never true  Transportation Needs: No Transportation Needs   Lack of Transportation (Medical): No   Lack of Transportation (Non-Medical): No  Physical Activity: Sufficiently Active   Days of Exercise per Week: 5 days   Minutes of Exercise per Session: 30 min  Stress: No Stress Concern Present   Feeling of Stress : Only a little  Social Connections: Moderately Integrated   Frequency of Communication with Friends and Family: More than three times a week   Frequency of Social Gatherings with Friends and Family: More than three times a week   Attends Religious Services: More than 4 times per year   Active Member of [employer] or Organizations: Yes   Attends Music therapist: More than 4 times per year   Marital Status: Separated  Intimate Partner Violence: Not At Risk   Fear of Current or Ex-Partner: No   Emotionally Abused: No   Physically Abused: No   Sexually Abused: No      Objective:    BP 135/72   Pulse 89   Temp 98.3 F (36.8 C) (Temporal)   Ht 5' 1"  (1.549 m)   Wt 207 lb 6.4 oz (94.1 kg)   SpO2 99%   BMI 39.19 kg/m   Wt Readings from Last 3 Encounters:  03/01/21 207 lb 6.4 oz (94.1 kg)  12/21/20 208 lb (94.3 kg)  12/10/20 208 lb (94.3 kg)    Physical Exam Vitals reviewed.  Constitutional:       General: She is not in acute distress.    Appearance: Normal appearance. She is obese. She is not ill-appearing, toxic-appearing or diaphoretic.  HENT:     Head: Normocephalic and atraumatic.     Right Ear: Tympanic membrane, ear canal and external ear normal. There is no impacted cerumen.     Left Ear: Tympanic membrane, ear canal and external ear normal. There is no impacted cerumen.     Nose: Nose normal. No congestion or rhinorrhea.     Mouth/Throat:     Mouth: Mucous membranes are moist.     Pharynx: Oropharynx is clear. No oropharyngeal exudate or posterior oropharyngeal erythema.  Eyes:     General: No scleral icterus.       Right eye: No discharge.        Left eye: No discharge.     Conjunctiva/sclera: Conjunctivae normal.     Pupils: Pupils are equal, round, and reactive to light.  Cardiovascular:     Rate and Rhythm: Normal rate and regular rhythm.     Heart sounds: Normal heart sounds. No murmur heard.   No friction rub. No gallop.  Pulmonary:     Effort: Pulmonary effort is normal. No respiratory distress.     Breath sounds: Normal breath sounds. No stridor. No wheezing, rhonchi or rales.  Abdominal:     General: Abdomen is flat. Bowel sounds are normal. There is no distension.     Palpations: Abdomen is soft. There is no hepatomegaly, splenomegaly or mass.     Tenderness: There is no abdominal tenderness. There is no guarding or rebound.     Hernia: No hernia is present.  Musculoskeletal:        General: Normal range of motion.     Cervical back: Normal range of motion and neck supple. No rigidity. No muscular tenderness.     Lumbar back: Tenderness present.  Left hip: Tenderness present.  Lymphadenopathy:     Cervical: No cervical adenopathy.  Skin:    General: Skin is warm and dry.     Capillary Refill: Capillary refill takes less than 2 seconds.  Neurological:     General: No focal deficit present.     Mental Status: She is alert and oriented to person,  place, and time. Mental status is at baseline.  Psychiatric:        Mood and Affect: Mood normal.        Behavior: Behavior normal.        Thought Content: Thought content normal.        Judgment: Judgment normal.    Lab Results  Component Value Date   TSH 0.536 06/27/2019   Lab Results  Component Value Date   WBC 8.4 12/10/2020   HGB 10.8 (L) 12/10/2020   HCT 32.9 (L) 12/10/2020   MCV 82 12/10/2020   PLT 270 12/10/2020   Lab Results  Component Value Date   NA 139 12/10/2020   K 4.3 12/10/2020   CO2 23 12/10/2020   GLUCOSE 98 12/10/2020   BUN 13 12/10/2020   CREATININE 0.57 12/10/2020   BILITOT 0.2 12/10/2020   ALKPHOS 77 12/10/2020   AST 8 12/10/2020   ALT 10 12/10/2020   PROT 6.7 12/10/2020   ALBUMIN 4.1 12/10/2020   CALCIUM 9.8 12/10/2020   ANIONGAP 8 07/18/2016   EGFR 92 12/10/2020   Lab Results  Component Value Date   CHOL 205 (H) 12/10/2020   Lab Results  Component Value Date   HDL 88 12/10/2020   Lab Results  Component Value Date   LDLCALC 104 (H) 12/10/2020   Lab Results  Component Value Date   TRIG 73 12/10/2020   Lab Results  Component Value Date   CHOLHDL 2.3 12/10/2020   Lab Results  Component Value Date   HGBA1C 5.8 06/27/2019

## 2021-03-02 DIAGNOSIS — M810 Age-related osteoporosis without current pathological fracture: Secondary | ICD-10-CM | POA: Diagnosis not present

## 2021-03-02 DIAGNOSIS — M8588 Other specified disorders of bone density and structure, other site: Secondary | ICD-10-CM | POA: Diagnosis not present

## 2021-03-02 DIAGNOSIS — Z780 Asymptomatic menopausal state: Secondary | ICD-10-CM | POA: Diagnosis not present

## 2021-03-02 LAB — CBC WITH DIFFERENTIAL/PLATELET
Basophils Absolute: 0 10*3/uL (ref 0.0–0.2)
Basos: 0 %
EOS (ABSOLUTE): 0.2 10*3/uL (ref 0.0–0.4)
Eos: 3 %
Hematocrit: 36.5 % (ref 34.0–46.6)
Hemoglobin: 11.6 g/dL (ref 11.1–15.9)
Immature Grans (Abs): 0 10*3/uL (ref 0.0–0.1)
Immature Granulocytes: 0 %
Lymphocytes Absolute: 2.3 10*3/uL (ref 0.7–3.1)
Lymphs: 29 %
MCH: 26.9 pg (ref 26.6–33.0)
MCHC: 31.8 g/dL (ref 31.5–35.7)
MCV: 85 fL (ref 79–97)
Monocytes Absolute: 0.5 10*3/uL (ref 0.1–0.9)
Monocytes: 6 %
Neutrophils Absolute: 4.8 10*3/uL (ref 1.4–7.0)
Neutrophils: 62 %
Platelets: 262 10*3/uL (ref 150–450)
RBC: 4.32 x10E6/uL (ref 3.77–5.28)
RDW: 13.6 % (ref 11.7–15.4)
WBC: 7.8 10*3/uL (ref 3.4–10.8)

## 2021-03-02 LAB — CMP14+EGFR
ALT: 10 IU/L (ref 0–32)
AST: 13 IU/L (ref 0–40)
Albumin/Globulin Ratio: 1.9 (ref 1.2–2.2)
Albumin: 4.4 g/dL (ref 3.7–4.7)
Alkaline Phosphatase: 77 IU/L (ref 44–121)
BUN/Creatinine Ratio: 11 — ABNORMAL LOW (ref 12–28)
BUN: 7 mg/dL — ABNORMAL LOW (ref 8–27)
Bilirubin Total: 0.3 mg/dL (ref 0.0–1.2)
CO2: 25 mmol/L (ref 20–29)
Calcium: 9.8 mg/dL (ref 8.7–10.3)
Chloride: 103 mmol/L (ref 96–106)
Creatinine, Ser: 0.65 mg/dL (ref 0.57–1.00)
Globulin, Total: 2.3 g/dL (ref 1.5–4.5)
Glucose: 93 mg/dL (ref 70–99)
Potassium: 4.2 mmol/L (ref 3.5–5.2)
Sodium: 142 mmol/L (ref 134–144)
Total Protein: 6.7 g/dL (ref 6.0–8.5)
eGFR: 89 mL/min/{1.73_m2} (ref >59)

## 2021-03-02 LAB — LIPID PANEL
Chol/HDL Ratio: 2.7 ratio (ref 0.0–4.4)
Cholesterol, Total: 216 mg/dL — ABNORMAL HIGH (ref 100–199)
HDL: 81 mg/dL (ref >39)
LDL Chol Calc (NIH): 118 mg/dL — ABNORMAL HIGH (ref 0–99)
Triglycerides: 99 mg/dL (ref 0–149)
VLDL Cholesterol Cal: 17 mg/dL (ref 5–40)

## 2021-03-07 ENCOUNTER — Other Ambulatory Visit: Payer: Self-pay | Admitting: Family Medicine

## 2021-03-07 ENCOUNTER — Encounter: Payer: Self-pay | Admitting: Family Medicine

## 2021-03-07 DIAGNOSIS — M461 Sacroiliitis, not elsewhere classified: Secondary | ICD-10-CM | POA: Diagnosis not present

## 2021-03-07 DIAGNOSIS — M1612 Unilateral primary osteoarthritis, left hip: Secondary | ICD-10-CM | POA: Diagnosis not present

## 2021-03-07 DIAGNOSIS — Z634 Disappearance and death of family member: Secondary | ICD-10-CM | POA: Diagnosis not present

## 2021-03-07 DIAGNOSIS — Z96641 Presence of right artificial hip joint: Secondary | ICD-10-CM | POA: Diagnosis not present

## 2021-03-07 DIAGNOSIS — M810 Age-related osteoporosis without current pathological fracture: Secondary | ICD-10-CM

## 2021-03-07 DIAGNOSIS — M1909 Primary osteoarthritis, other specified site: Secondary | ICD-10-CM | POA: Diagnosis not present

## 2021-03-07 DIAGNOSIS — Z471 Aftercare following joint replacement surgery: Secondary | ICD-10-CM | POA: Diagnosis not present

## 2021-03-07 MED ORDER — ALENDRONATE SODIUM 70 MG PO TABS: 70.0000 mg | ORAL_TABLET | ORAL | 5 refills | Status: DC

## 2021-04-04 DIAGNOSIS — J029 Acute pharyngitis, unspecified: Secondary | ICD-10-CM | POA: Diagnosis not present

## 2021-04-04 DIAGNOSIS — J329 Chronic sinusitis, unspecified: Secondary | ICD-10-CM | POA: Diagnosis not present

## 2021-04-09 ENCOUNTER — Other Ambulatory Visit: Payer: Self-pay | Admitting: Family

## 2021-04-09 DIAGNOSIS — B9689 Other specified bacterial agents as the cause of diseases classified elsewhere: Secondary | ICD-10-CM

## 2021-04-09 DIAGNOSIS — J208 Acute bronchitis due to other specified organisms: Secondary | ICD-10-CM

## 2021-04-28 ENCOUNTER — Encounter: Payer: Self-pay | Admitting: Family Medicine

## 2021-04-28 ENCOUNTER — Ambulatory Visit (INDEPENDENT_AMBULATORY_CARE_PROVIDER_SITE_OTHER): Payer: Medicare Other | Admitting: Family Medicine

## 2021-04-28 VITALS — BP 140/67 | HR 64 | Temp 97.9°F | Ht 61.0 in | Wt 209.4 lb

## 2021-04-28 DIAGNOSIS — M150 Primary generalized (osteo)arthritis: Secondary | ICD-10-CM

## 2021-04-28 DIAGNOSIS — M159 Polyosteoarthritis, unspecified: Secondary | ICD-10-CM | POA: Diagnosis not present

## 2021-04-28 DIAGNOSIS — M51369 Other intervertebral disc degeneration, lumbar region without mention of lumbar back pain or lower extremity pain: Secondary | ICD-10-CM

## 2021-04-28 DIAGNOSIS — M5136 Other intervertebral disc degeneration, lumbar region: Secondary | ICD-10-CM | POA: Diagnosis not present

## 2021-04-28 DIAGNOSIS — Z79899 Other long term (current) drug therapy: Secondary | ICD-10-CM

## 2021-04-28 DIAGNOSIS — E782 Mixed hyperlipidemia: Secondary | ICD-10-CM | POA: Diagnosis not present

## 2021-04-28 DIAGNOSIS — E669 Obesity, unspecified: Secondary | ICD-10-CM

## 2021-04-28 DIAGNOSIS — M810 Age-related osteoporosis without current pathological fracture: Secondary | ICD-10-CM

## 2021-04-28 MED ORDER — METHOCARBAMOL 500 MG PO TABS: 500.0000 mg | ORAL_TABLET | Freq: Three times a day (TID) | ORAL | 2 refills | Status: DC | PRN

## 2021-04-28 NOTE — Progress Notes (Signed)
Assessment & Plan:  1-3. Primary osteoarthritis involving multiple joints/DDD (degenerative disc disease), lumbar/Controlled substance agreement signed - Controlled substance agreement in place. Urine drug screen previously did not show Tramadol (previous prescription), repeating today. PDMP reviewed with no concerning findings.  - added Robaxin to reduce muscle tension - reminded that narcotics require separate prescriptions and she has 2 at the pharmacy available - CMP14+EGFR - [employer] Select 13 (MW), Urine - methocarbamol (ROBAXIN) 500 MG tablet; Take 1 tablet (500 mg total) by mouth every 8 (eight) hours as needed for muscle spasms.  Dispense: 60 tablet; Refill: 2 - ToxASSU[employer] Select 13 (MW), Urine  4. Mixed hyperlipidemia - Lipid panel - CMP14+EGFR  5. Obesity (BMI 30-39.9) - encouraged healthy diet and exercise - printed education provided on obestiy - Lipid panel - CMP14+EGFR  6. Age-related osteoporosis without current pathological fracture - continue fosamax   Return in about 3 months (around 07/27/2021) for follow-up of chronic medication conditions.  Melissa Crater, NP Student  I personally was present during the history, physical exam, and medical decision-making activities of this service and have verified that the service and findings are accurately documented in the nurse practitioner student's note.  Hendricks Limes, MSN, APRN, FNP-C [facility]  Subjective:    Patient ID: Melissa Zavala, female    DOB: 1940-08-01, 80 y.o.   MRN: [id number]  Patient Care Team: Loman Brooklyn, FNP as PCP - General (Family Medicine) Harl Bowie, Alphonse Guild, MD as PCP - Cardiology (Cardiology) Ilean China, RN as Case Manager Vela Prose, MD as Surgeon (Student)   Chief Complaint:  Chief Complaint  Patient presents with   Hyperlipidemia   Hypertension    Check up of chronic medical conditions    Back Pain    X 1 week     HPI: Melissa Zavala is  a 80 y.o. female presenting on 04/28/2021 for Hyperlipidemia, Hypertension (Check up of chronic medical conditions ), and Back Pain (X 1 week/)  Hypertension: she is taking amlodipine 10 mg daily, lisinopril 40 mg daily, and lasix 20 mg daily.   Hyperlipidemia: she is not on medication for this due to intolerance. She was referred to a nutritionist to help with diet.  Osteoporosis: Fosamax added in October for positive osteoporosis on her DEXA.   New complaints: She initially stated that she has low back pain x 1 week, but upon further questioning she states this has been going on much longer. She had imaging done in September of her spine which showed severe multilevel degenerative disc disease throughout the lumbar spine. She states the orthopedic doing her hip surgery said he would work on her spine if her weight decreased to at least 195 lbs, which she has not yet accomplished.    Social history:  Relevant past medical, surgical, family and social history reviewed and updated as indicated. Interim medical history since our last visit reviewed.  Allergies and medications reviewed and updated.  DATA REVIEWED: CHART IN EPIC  ROS: Negative unless specifically indicated above in HPI.    Current Outpatient Medications:    alendronate (FOSAMAX) 70 MG tablet, Take 1 tablet (70 mg total) by mouth every 7 (seven) days. Take with a full glass of water on an empty stomach., Disp: 4 tablet, Rfl: 5   amLODipine (NORVASC) 10 MG tablet, Take 1 tablet (10 mg total) by mouth daily., Disp: 90 tablet, Rfl: 1   CALCIUM PO, Take by mouth daily., Disp: , Rfl:  celecoxib (CELEBREX) 200 MG capsule, 1 tablet daily, Disp: 90 capsule, Rfl: 1   esomeprazole (NEXIUM) 40 MG capsule, 1 tablet daily, Disp: 90 capsule, Rfl: 1   fluticasone (FLONASE) 50 MCG/ACT nasal spray, Place 2 sprays into both nostrils daily., Disp: 16 g, Rfl: 2   furosemide (LASIX) 20 MG tablet, Take 2 tablets (40 mg total) by mouth daily.,  Disp: 180 tablet, Rfl: 1   HYDROcodone-acetaminophen (NORCO) 5-325 MG tablet, Take 1 tablet by mouth every 8 (eight) hours as needed for moderate pain., Disp: 90 tablet, Rfl: 0   HYDROcodone-acetaminophen (NORCO) 5-325 MG tablet, Take 1 tablet by mouth every 8 (eight) hours as needed for moderate pain., Disp: 90 tablet, Rfl: 0   [START ON 04/30/2021] HYDROcodone-acetaminophen (NORCO) 5-325 MG tablet, Take 1 tablet by mouth every 8 (eight) hours as needed for moderate pain., Disp: 90 tablet, Rfl: 0   levocetirizine (XYZAL) 5 MG tablet, Take 1 tablet (5 mg total) by mouth every evening., Disp: 90 tablet, Rfl: 1   lisinopril (ZESTRIL) 40 MG tablet, Take 1 tablet (40 mg total) by mouth daily., Disp: 90 tablet, Rfl: 1   methocarbamol (ROBAXIN) 500 MG tablet, Take 1 tablet (500 mg total) by mouth every 8 (eight) hours as needed for muscle spasms., Disp: 60 tablet, Rfl: 2   potassium chloride (KLOR-CON) 10 MEQ tablet, Take 1 tablet (10 mEq total) by mouth 2 (two) times daily., Disp: 180 tablet, Rfl: 1   VITAMIN D PO, Take by mouth daily., Disp: , Rfl:    Allergies  Allergen Reactions   Iodinated Diagnostic Agents Anaphylaxis   Codeine Other (See Comments)    Hallucinations   Diphenhydramine Other (See Comments)    Patient states "feels like somebody is inside of me clawing me out"   Statins Other (See Comments)    Joint pains and weakness.   Zetia [Ezetimibe] Other (See Comments)    Aching in arms and legs.   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   Osteoporosis    Vitamin B12 deficiency 12/06/2017    Past Surgical History:  Procedure Laterality Date   TOTAL HIP ARTHROPLASTY Right    TOTAL KNEE ARTHROPLASTY Bilateral    TUBAL LIGATION      Social History   Socioeconomic History   Marital status: Divorced    Spouse name: Not on file   Number of children: 7   Years of education: Not on file   Highest education level: 8th  grade  Occupational History   Occupation: Retired  Tobacco Use   Smoking status: Never   Smokeless tobacco: Never  Vaping Use   Vaping Use: Never used  Substance and Sexual Activity   Alcohol use: No   Drug use: No   Sexual activity: Not Currently  Other Topics Concern   Not on file  Social History Narrative   Lives alone - all of her children live nearby   Social Determinants of Health   Financial Resource Strain: High Risk   Difficulty of Paying Living Expenses: Hard  Food Insecurity: No Food Insecurity   Worried About Charity fundraiser in the Last Year: Never true   [place] in the Last Year: Never true  Transportation Needs: No Transportation Needs   Lack of Transportation (Medical): No   Lack of Transportation (Non-Medical): No  Physical Activity: Sufficiently Active   Days of Exercise per Week: 5 days  Minutes of Exercise per Session: 30 min  Stress: No Stress Concern Present   Feeling of Stress : Only a little  Social Connections: Moderately Integrated   Frequency of Communication with Friends and Family: More than three times a week   Frequency of Social Gatherings with Friends and Family: More than three times a week   Attends Religious Services: More than 4 times per year   Active Member of [employer] or Organizations: Yes   Attends Music therapist: More than 4 times per year   Marital Status: Separated  Intimate Partner Violence: Not At Risk   Fear of Current or Ex-Partner: No   Emotionally Abused: No   Physically Abused: No   Sexually Abused: No        Objective:    BP 140/67    Pulse 64    Temp 97.9 F (36.6 C) (Temporal)    Ht _0  (1.549 m)    Wt 95 kg    SpO2 98%    BMI 39.57 kg/m   Wt Readings from Last 3 Encounters:  04/28/21 209 lb 6.4 oz (95 kg)  03/01/21 207 lb 6.4 oz (94.1 kg)  12/21/20 208 lb (94.3 kg)    Physical Exam Vitals reviewed.  Constitutional:      General: She is not in acute distress.    Appearance:  Normal appearance. She is obese. She is not ill-appearing, toxic-appearing or diaphoretic.  HENT:     Head: Normocephalic and atraumatic.     Nose: Nose normal.     Mouth/Throat:     Mouth: Mucous membranes are moist.     Pharynx: Oropharynx is clear.  Eyes:     Extraocular Movements: Extraocular movements intact.     Conjunctiva/sclera: Conjunctivae normal.     Pupils: Pupils are equal, round, and reactive to light.  Cardiovascular:     Rate and Rhythm: Normal rate and regular rhythm.     Pulses: Normal pulses.     Heart sounds: Normal heart sounds.  Pulmonary:     Effort: Pulmonary effort is normal. No respiratory distress.  Abdominal:     General: There is no distension.     Palpations: Abdomen is soft. There is no mass.  Musculoskeletal:        General: Tenderness (low back, left hip) present. Normal range of motion.     Cervical back: Normal range of motion.  Skin:    General: Skin is warm and dry.  Neurological:     General: No focal deficit present.     Mental Status: She is alert and oriented to person, place, and time.     Motor: No weakness.     Gait: Gait abnormal.  Psychiatric:        Mood and Affect: Mood normal.        Behavior: Behavior normal.        Thought Content: Thought content normal.        Judgment: Judgment normal.    Lab Results  Component Value Date   TSH 0.536 06/27/2019   Lab Results  Component Value Date   WBC 7.8 03/01/2021   HGB 11.6 03/01/2021   HCT 36.5 03/01/2021   MCV 85 03/01/2021   PLT 262 03/01/2021   Lab Results  Component Value Date   NA 142 03/01/2021   K 4.2 03/01/2021   CO2 25 03/01/2021   GLUCOSE 93 03/01/2021   BUN 7 (L) 03/01/2021   CREATININE 0.65 03/01/2021   BILITOT  0.3 03/01/2021   ALKPHOS 77 03/01/2021   AST 13 03/01/2021   ALT 10 03/01/2021   PROT 6.7 03/01/2021   ALBUMIN 4.4 03/01/2021   CALCIUM 9.8 03/01/2021   ANIONGAP 8 07/18/2016   EGFR 89 03/01/2021   Lab Results  Component Value Date    CHOL 216 (H) 03/01/2021   Lab Results  Component Value Date   HDL 81 03/01/2021   Lab Results  Component Value Date   LDLCALC 118 (H) 03/01/2021   Lab Results  Component Value Date   TRIG 99 03/01/2021   Lab Results  Component Value Date   CHOLHDL 2.7 03/01/2021   Lab Results  Component Value Date   HGBA1C 5.8 06/27/2019

## 2021-04-29 LAB — CMP14+EGFR
ALT: 10 IU/L (ref 0–32)
AST: 12 IU/L (ref 0–40)
Albumin/Globulin Ratio: 1.8 (ref 1.2–2.2)
Albumin: 4.3 g/dL (ref 3.7–4.7)
Alkaline Phosphatase: 71 IU/L (ref 44–121)
BUN/Creatinine Ratio: 17 (ref 12–28)
BUN: 12 mg/dL (ref 8–27)
Bilirubin Total: 0.4 mg/dL (ref 0.0–1.2)
CO2: 23 mmol/L (ref 20–29)
Calcium: 10.2 mg/dL (ref 8.7–10.3)
Chloride: 101 mmol/L (ref 96–106)
Creatinine, Ser: 0.72 mg/dL (ref 0.57–1.00)
Globulin, Total: 2.4 g/dL (ref 1.5–4.5)
Glucose: 103 mg/dL — ABNORMAL HIGH (ref 70–99)
Potassium: 4.2 mmol/L (ref 3.5–5.2)
Sodium: 137 mmol/L (ref 134–144)
Total Protein: 6.7 g/dL (ref 6.0–8.5)
eGFR: 84 mL/min/{1.73_m2} (ref >59)

## 2021-04-29 LAB — LIPID PANEL
Chol/HDL Ratio: 2.7 ratio (ref 0.0–4.4)
Cholesterol, Total: 233 mg/dL — ABNORMAL HIGH (ref 100–199)
HDL: 86 mg/dL (ref >39)
LDL Chol Calc (NIH): 130 mg/dL — ABNORMAL HIGH (ref 0–99)
Triglycerides: 97 mg/dL (ref 0–149)
VLDL Cholesterol Cal: 17 mg/dL (ref 5–40)

## 2021-05-05 NOTE — Progress Notes (Signed)
R/C about lab results

## 2021-05-06 LAB — TOXASSURE SELECT 13 (MW), URINE

## 2021-06-14 ENCOUNTER — Encounter: Payer: Self-pay | Admitting: Family

## 2021-06-14 ENCOUNTER — Ambulatory Visit (INDEPENDENT_AMBULATORY_CARE_PROVIDER_SITE_OTHER): Payer: Medicare Other | Admitting: Family

## 2021-06-14 VITALS — BP 152/38 | HR 93 | Temp 97.0°F | Ht 61.0 in | Wt 212.0 lb

## 2021-06-14 DIAGNOSIS — J0100 Acute maxillary sinusitis, unspecified: Secondary | ICD-10-CM | POA: Diagnosis not present

## 2021-06-14 DIAGNOSIS — R6889 Other general symptoms and signs: Secondary | ICD-10-CM

## 2021-06-14 DIAGNOSIS — J029 Acute pharyngitis, unspecified: Secondary | ICD-10-CM

## 2021-06-14 LAB — VERITOR FLU A/B WAIVED
Influenza A: NEGATIVE
Influenza B: NEGATIVE

## 2021-06-14 LAB — RAPID STREP SCREEN (MED CTR MEBANE ONLY): Strep Gp A Ag, IA W/Reflex: NEGATIVE

## 2021-06-14 LAB — CULTURE, GROUP A STREP

## 2021-06-14 NOTE — Progress Notes (Signed)
° °  Subjective:    Patient ID: Melissa Zavala, female    DOB: [birth year], 81 y.o.   MRN: [id number]  Chief Complaint  Patient presents with   Sore Throat   Nasal Congestion    Sore Throat  This is a new problem. The current episode started yesterday. The problem has been gradually worsening. There has been no fever. The pain is at a severity of 8/10. The pain is mild. Associated symptoms include congestion, headaches and shortness of breath. Pertinent negatives include no coughing, ear pain, swollen glands or trouble swallowing. She has tried acetaminophen for the symptoms. The treatment provided mild relief.     Review of Systems  HENT:  Positive for congestion. Negative for ear pain and trouble swallowing.   Respiratory:  Positive for shortness of breath. Negative for cough.   Neurological:  Positive for headaches.  All other systems reviewed and are negative.     Objective:   Physical Exam Vitals reviewed.  Constitutional:      General: She is not in acute distress.    Appearance: She is well-developed.  HENT:     Head: Normocephalic and atraumatic.     Right Ear: External ear normal.  Eyes:     Pupils: Pupils are equal, round, and reactive to light.  Neck:     Thyroid: No thyromegaly.  Cardiovascular:     Rate and Rhythm: Normal rate and regular rhythm.     Heart sounds: Normal heart sounds. No murmur heard. Pulmonary:     Effort: Pulmonary effort is normal. No respiratory distress.     Breath sounds: Normal breath sounds. No wheezing.  Abdominal:     General: Bowel sounds are normal. There is no distension.     Palpations: Abdomen is soft.     Tenderness: There is no abdominal tenderness.  Musculoskeletal:        General: No tenderness. Normal range of motion.     Cervical back: Normal range of motion and neck supple.  Skin:    General: Skin is warm and dry.  Neurological:     Mental Status: She is alert and oriented to person, place, and time.     Cranial Nerves:  No cranial nerve deficit.     Deep Tendon Reflexes: Reflexes are normal and symmetric.  Psychiatric:        Behavior: Behavior normal.        Thought Content: Thought content normal.        Judgment: Judgment normal.     BP (!) 152/38    Pulse 93    Temp (!) 97 F (36.1 C) (Temporal)    Ht 5\' 1"  (1.549 m)    Wt 212 lb (96.2 kg)    BMI 40.06 kg/m       Assessment & Plan:  Melissa Zavala comes in today with chief complaint of Sore Throat and Nasal Congestion   Diagnosis and orders addressed:  1. Sore throat - Rapid Strep Screen ([facility] ONLY) - Veritor Flu A/B Waived - Novel Coronavirus, NAA ([facility])  2. Flu-like symptoms  Rest, force fluids, tylenol as needed, report any worsening symptoms such as increased shortness of breath, swelling, or continued high fevers. COVID pending    Evelina Dun, FNP

## 2021-06-14 NOTE — Patient Instructions (Signed)

## 2021-06-15 LAB — SARS-COV-2, NAA 2 DAY TAT

## 2021-06-15 LAB — NOVEL CORONAVIRUS, NAA: SARS-CoV-2, NAA: NOT DETECTED

## 2021-06-27 DIAGNOSIS — I10 Essential (primary) hypertension: Secondary | ICD-10-CM | POA: Diagnosis not present

## 2021-06-27 DIAGNOSIS — R059 Cough, unspecified: Secondary | ICD-10-CM | POA: Diagnosis not present

## 2021-06-27 DIAGNOSIS — R0602 Shortness of breath: Secondary | ICD-10-CM | POA: Diagnosis not present

## 2021-06-27 DIAGNOSIS — Z888 Allergy status to other drugs, medicaments and biological substances status: Secondary | ICD-10-CM | POA: Diagnosis not present

## 2021-06-27 DIAGNOSIS — R0789 Other chest pain: Secondary | ICD-10-CM | POA: Diagnosis not present

## 2021-06-27 DIAGNOSIS — Z20822 Contact with and (suspected) exposure to covid-19: Secondary | ICD-10-CM | POA: Diagnosis not present

## 2021-06-27 DIAGNOSIS — I517 Cardiomegaly: Secondary | ICD-10-CM | POA: Diagnosis not present

## 2021-06-27 DIAGNOSIS — R918 Other nonspecific abnormal finding of lung field: Secondary | ICD-10-CM | POA: Diagnosis not present

## 2021-06-27 DIAGNOSIS — R350 Frequency of micturition: Secondary | ICD-10-CM | POA: Diagnosis not present

## 2021-07-11 ENCOUNTER — Other Ambulatory Visit: Payer: Self-pay | Admitting: Family Medicine

## 2021-07-11 DIAGNOSIS — Z79899 Other long term (current) drug therapy: Secondary | ICD-10-CM

## 2021-07-11 DIAGNOSIS — M159 Polyosteoarthritis, unspecified: Secondary | ICD-10-CM

## 2021-07-12 NOTE — Telephone Encounter (Signed)
Controlled substance please deny

## 2021-07-27 ENCOUNTER — Encounter: Payer: Self-pay | Admitting: Family Medicine

## 2021-07-27 ENCOUNTER — Ambulatory Visit (INDEPENDENT_AMBULATORY_CARE_PROVIDER_SITE_OTHER): Payer: Medicare Other | Admitting: Family Medicine

## 2021-07-27 VITALS — BP 161/77 | HR 76 | Temp 97.5°F | Ht 61.0 in | Wt 207.4 lb

## 2021-07-27 DIAGNOSIS — I10 Essential (primary) hypertension: Secondary | ICD-10-CM | POA: Diagnosis not present

## 2021-07-27 DIAGNOSIS — R609 Edema, unspecified: Secondary | ICD-10-CM

## 2021-07-27 DIAGNOSIS — G5602 Carpal tunnel syndrome, left upper limb: Secondary | ICD-10-CM

## 2021-07-27 DIAGNOSIS — Z79899 Other long term (current) drug therapy: Secondary | ICD-10-CM | POA: Diagnosis not present

## 2021-07-27 DIAGNOSIS — M159 Polyosteoarthritis, unspecified: Secondary | ICD-10-CM

## 2021-07-27 DIAGNOSIS — J302 Other seasonal allergic rhinitis: Secondary | ICD-10-CM

## 2021-07-27 DIAGNOSIS — E782 Mixed hyperlipidemia: Secondary | ICD-10-CM | POA: Diagnosis not present

## 2021-07-27 DIAGNOSIS — K219 Gastro-esophageal reflux disease without esophagitis: Secondary | ICD-10-CM

## 2021-07-27 MED ORDER — POTASSIUM CHLORIDE ER 10 MEQ PO TBCR: 10.0000 meq | EXTENDED_RELEASE_TABLET | Freq: Two times a day (BID) | ORAL | 1 refills | Status: DC

## 2021-07-27 MED ORDER — HYDROCODONE-ACETAMINOPHEN 5-325 MG PO TABS: 1.0000 | ORAL_TABLET | Freq: Three times a day (TID) | ORAL | 0 refills | Status: DC | PRN

## 2021-07-27 MED ORDER — FUROSEMIDE 20 MG PO TABS: 40.0000 mg | ORAL_TABLET | Freq: Every day | ORAL | 1 refills | Status: DC

## 2021-07-27 MED ORDER — LEVOCETIRIZINE DIHYDROCHLORIDE 5 MG PO TABS: 5.0000 mg | ORAL_TABLET | Freq: Every evening | ORAL | 1 refills | Status: DC

## 2021-07-27 MED ORDER — AMLODIPINE BESYLATE 10 MG PO TABS: 10.0000 mg | ORAL_TABLET | Freq: Every day | ORAL | 1 refills | Status: DC

## 2021-07-27 MED ORDER — CELECOXIB 200 MG PO CAPS: ORAL_CAPSULE | ORAL | 1 refills | Status: DC

## 2021-07-27 MED ORDER — LISINOPRIL 40 MG PO TABS: 40.0000 mg | ORAL_TABLET | Freq: Every day | ORAL | 1 refills | Status: DC

## 2021-07-27 MED ORDER — ESOMEPRAZOLE MAGNESIUM 40 MG PO CPDR: DELAYED_RELEASE_CAPSULE | ORAL | 1 refills | Status: DC

## 2021-07-27 NOTE — Progress Notes (Signed)
? ?Assessment & Plan:  ?1-2. Primary osteoarthritis involving multiple joints/Controlled substance agreement signed ?Well controlled on current regimen. Controlled substance agreement in place and updated today. Urine drug screen as expected. PDMP reviewed with no concerning findings.  ?- HYDROcodone-acetaminophen (NORCO) 5-325 MG tablet; Take 1 tablet by mouth every 8 (eight) hours as needed for moderate pain.  Dispense: 90 tablet; Refill: 0 ?- HYDROcodone-acetaminophen (NORCO) 5-325 MG tablet; Take 1 tablet by mouth every 8 (eight) hours as needed for moderate pain.  Dispense: 90 tablet; Refill: 0 ?- HYDROcodone-acetaminophen (NORCO) 5-325 MG tablet; Take 1 tablet by mouth every 8 (eight) hours as needed for moderate pain.  Dispense: 90 tablet; Refill: 0 ?- celecoxib (CELEBREX) 200 MG capsule; 1 tablet daily  Dispense: 90 capsule; Refill: 1 ?- CMP14+EGFR ? ?3. Essential hypertension ?Well controlled on current regimen.  ?- amLODipine (NORVASC) 10 MG tablet; Take 1 tablet (10 mg total) by mouth daily.  Dispense: 90 tablet; Refill: 1 ?- lisinopril (ZESTRIL) 40 MG tablet; Take 1 tablet (40 mg total) by mouth daily.  Dispense: 90 tablet; Refill: 1 ?- Lipid panel ?- CBC with Differential/Platelet ?- CMP14+EGFR ? ?4. Mixed hyperlipidemia ?Labs to assess. ?- Lipid panel ?- CBC with Differential/Platelet ?- CMP14+EGFR ? ?5. Edema, unspecified type ?Well controlled on current regimen.  ?- furosemide (LASIX) 20 MG tablet; Take 2 tablets (40 mg total) by mouth daily.  Dispense: 180 tablet; Refill: 1 ?- potassium chloride (KLOR-CON) 10 MEQ tablet; Take 1 tablet (10 mEq total) by mouth 2 (two) times daily.  Dispense: 180 tablet; Refill: 1 ?- CMP14+EGFR ? ?6. Gastroesophageal reflux disease without esophagitis ?Well controlled on current regimen.  ?- esomeprazole (NEXIUM) 40 MG capsule; 1 tablet daily  Dispense: 90 capsule; Refill: 1 ?- CMP14+EGFR ? ?7. Seasonal allergies ?Well controlled on current regimen.  ?- levocetirizine  (XYZAL) 5 MG tablet; Take 1 tablet (5 mg total) by mouth every evening.  Dispense: 90 tablet; Refill: 1 ?- CMP14+EGFR ? ?8. Left carpal tunnel syndrome ?Education provided on carpal tunnel syndrome.  Encouraged patient to start wearing a wrist splint at night. ?- Ambulatory referral to Orthopedic Surgery ? ? ? ?Return in about 3 months (around 10/27/2021) for follow-up of chronic medication conditions. ? ?Melissa Limes, MSN, APRN, FNP-C ?[place] ? ?Subjective:  ? ? Patient ID: Melissa Zavala, female    DOB: July 29, 1940, 81 y.o.   MRN: [id number] ? ?Patient Care Team: ?Loman Brooklyn, FNP as PCP - General (Family Medicine) ?Arnoldo Lenis, MD as PCP - Cardiology (Cardiology) ?Ilean China, RN as Case Manager ?Vela Prose, MD as Surgeon (Student)  ? ?Chief Complaint:  ?Chief Complaint  ?Patient presents with  ? Medical Management of Chronic Issues  ? numbess  ?  Patient states he has been having bilateral hand numbness.  It is worse in the left hand and has gotten worse.   ? ? ?HPI: ?[name] is a 81 y.o. female presenting on 07/27/2021 for Medical Management of Chronic Issues and numbess (Patient states he has been having bilateral hand numbness.  It is worse in the left hand and has gotten worse. ) ? ?Pain assessment: ?Cause of pain- arthritis ?Pain location- legs & back ?Pain on scale of 1-10- 8/10 without medication; 3-4/10 with medication ?Frequency- comes and goes ?What increases pain- prolonged standing ?What makes pain better- rest and medication ?Effects on ADL- none ?Any change in general medical condition- none ? ?Current opioids rx- Norco 5/325 mg Q8H PRN ?# meds  rx- 90 ?Effectiveness of current meds- effective ?Adverse reactions form pain meds- none ?Morphine equivalent- 15 MME/day ? ?Pill count performed-No ?Last drug screen - 04/28/2021 ?( high risk q35m moderate risk q653mlow risk yearly ) ?Urine drug screen today- No ?Was the NCDonaldeviewed- Yes ? If yes were  their any concerning findings? - No ? ?Overdose risk: 220 ? ?Opioid Risk  12/02/2019  ?Alcohol 0  ?Illegal Drugs 3  ?Rx Drugs 4  ?Alcohol 0  ?Illegal Drugs 0  ?Rx Drugs 0  ?Age between 1643-45ears  0  ?History of Preadolescent Sexual Abuse 3  ?Psychological Disease 0  ?Depression 0  ?Opioid Risk Tool Scoring 10  ?Opioid Risk Interpretation High Risk  ? ?Pain contract signed on: 09/14/2020 - updated today 07/27/2021. ? ? ?Hypertension: patient reports her average BP at home is 130-140s/60-70s. She is taking her medication regularly. Patient is not exercising.  ? ?Edema: controlled with furosemide. ? ?Hyperlipidemia: Patient has previously failed treatment with statins and zetia due to pain. She was most recently suppose to start red yeast rice which she has done on occasion. She has changed her diet to try to improve. ? ?GERD: Taking Nexium daily. ? ?Allergies: Taking Xyzal every evening. ? ? ?New complaints: ?Patient reports numbness in both hands for the past 4-5 months. Left is much worse than the right. She reports it is continuously getting worse. Pain occurs mostly at night.   ? ? ?Social history: ? ?Relevant past medical, surgical, family and social history reviewed and updated as indicated. Interim medical history since our last visit reviewed. ? ?Allergies and medications reviewed and updated. ? ?DATA REVIEWED: CHART IN EPIC ? ?ROS: Negative unless specifically indicated above in HPI.  ? ? ?Current Outpatient Medications:  ?  alendronate (FOSAMAX) 70 MG tablet, Take 1 tablet (70 mg total) by mouth every 7 (seven) days. Take with a full glass of water on an empty stomach., Disp: 4 tablet, Rfl: 5 ?  amLODipine (NORVASC) 10 MG tablet, Take 1 tablet (10 mg total) by mouth daily., Disp: 90 tablet, Rfl: 1 ?  CALCIUM PO, Take by mouth daily., Disp: , Rfl:  ?  celecoxib (CELEBREX) 200 MG capsule, 1 tablet daily, Disp: 90 capsule, Rfl: 1 ?  esomeprazole (NEXIUM) 40 MG capsule, 1 tablet daily, Disp: 90 capsule, Rfl: 1 ?   furosemide (LASIX) 20 MG tablet, Take 2 tablets (40 mg total) by mouth daily., Disp: 180 tablet, Rfl: 1 ?  HYDROcodone-acetaminophen (NORCO) 5-325 MG tablet, Take 1 tablet by mouth every 8 (eight) hours as needed for moderate pain., Disp: 90 tablet, Rfl: 0 ?  HYDROcodone-acetaminophen (NORCO) 5-325 MG tablet, Take 1 tablet by mouth every 8 (eight) hours as needed for moderate pain., Disp: 90 tablet, Rfl: 0 ?  HYDROcodone-acetaminophen (NORCO) 5-325 MG tablet, Take 1 tablet by mouth every 8 (eight) hours as needed for moderate pain., Disp: 90 tablet, Rfl: 0 ?  levocetirizine (XYZAL) 5 MG tablet, Take 1 tablet (5 mg total) by mouth every evening., Disp: 90 tablet, Rfl: 1 ?  lisinopril (ZESTRIL) 40 MG tablet, Take 1 tablet (40 mg total) by mouth daily., Disp: 90 tablet, Rfl: 1 ?  potassium chloride (KLOR-CON) 10 MEQ tablet, Take 1 tablet (10 mEq total) by mouth 2 (two) times daily., Disp: 180 tablet, Rfl: 1 ?  VITAMIN D PO, Take by mouth daily., Disp: , Rfl:  ?  fluticasone (FLONASE) 50 MCG/ACT nasal spray, Place 2 sprays into both nostrils daily. (Patient not taking: Reported  on 07/27/2021), Disp: 16 g, Rfl: 2 ?  methocarbamol (ROBAXIN) 500 MG tablet, Take 1 tablet (500 mg total) by mouth every 8 (eight) hours as needed for muscle spasms. (Patient not taking: Reported on 07/27/2021), Disp: 60 tablet, Rfl: 2  ? ?Allergies  ?Allergen Reactions  ? Iodinated Contrast Media Anaphylaxis  ? Diphenhydramine Other (See Comments)  ?  Patient states "feels like somebody is inside of me clawing me out"  ? Codeine Other (See Comments)  ?  Hallucinations  ? Statins Other (See Comments)  ?  Joint pains and weakness.  ? Zetia [Ezetimibe] Other (See Comments)  ?  Aching in arms and legs.  ? ?Past Medical History:  ?Diagnosis Date  ? Arthritis   ? Hypertension   ? Mixed hyperlipidemia 06/14/2020  ? Osteoporosis   ? Vitamin B12 deficiency 12/06/2017  ?  ?Past Surgical History:  ?Procedure Laterality Date  ? TOTAL HIP ARTHROPLASTY Right    ? TOTAL KNEE ARTHROPLASTY Bilateral   ? TUBAL LIGATION    ?  ?Social History  ? ?Socioeconomic History  ? Marital status: Divorced  ?  Spouse name: Not on file  ? Number of children: 7  ? Years of educa

## 2021-07-28 ENCOUNTER — Other Ambulatory Visit: Payer: Self-pay | Admitting: Family Medicine

## 2021-07-28 LAB — CBC WITH DIFFERENTIAL/PLATELET
Basophils Absolute: 0.1 10*3/uL (ref 0.0–0.2)
Basos: 1 %
EOS (ABSOLUTE): 0.2 10*3/uL (ref 0.0–0.4)
Eos: 2 %
Hematocrit: 36.4 % (ref 34.0–46.6)
Hemoglobin: 11.7 g/dL (ref 11.1–15.9)
Immature Grans (Abs): 0 10*3/uL (ref 0.0–0.1)
Immature Granulocytes: 0 %
Lymphocytes Absolute: 2.2 10*3/uL (ref 0.7–3.1)
Lymphs: 26 %
MCH: 26.9 pg (ref 26.6–33.0)
MCHC: 32.1 g/dL (ref 31.5–35.7)
MCV: 84 fL (ref 79–97)
Monocytes Absolute: 0.5 10*3/uL (ref 0.1–0.9)
Monocytes: 6 %
Neutrophils Absolute: 5.6 10*3/uL (ref 1.4–7.0)
Neutrophils: 65 %
Platelets: 252 10*3/uL (ref 150–450)
RBC: 4.35 x10E6/uL (ref 3.77–5.28)
RDW: 13 % (ref 11.7–15.4)
WBC: 8.5 10*3/uL (ref 3.4–10.8)

## 2021-07-28 LAB — CMP14+EGFR
ALT: 9 IU/L (ref 0–32)
AST: 12 IU/L (ref 0–40)
Albumin/Globulin Ratio: 2.3 — ABNORMAL HIGH (ref 1.2–2.2)
Albumin: 4.5 g/dL (ref 3.7–4.7)
Alkaline Phosphatase: 78 IU/L (ref 44–121)
BUN/Creatinine Ratio: 18 (ref 12–28)
BUN: 12 mg/dL (ref 8–27)
Bilirubin Total: 0.4 mg/dL (ref 0.0–1.2)
CO2: 25 mmol/L (ref 20–29)
Calcium: 9.9 mg/dL (ref 8.7–10.3)
Chloride: 105 mmol/L (ref 96–106)
Creatinine, Ser: 0.67 mg/dL (ref 0.57–1.00)
Globulin, Total: 2 g/dL (ref 1.5–4.5)
Glucose: 94 mg/dL (ref 70–99)
Potassium: 4.1 mmol/L (ref 3.5–5.2)
Sodium: 145 mmol/L — ABNORMAL HIGH (ref 134–144)
Total Protein: 6.5 g/dL (ref 6.0–8.5)
eGFR: 88 mL/min/{1.73_m2} (ref >59)

## 2021-07-28 LAB — LIPID PANEL
Chol/HDL Ratio: 2.5 ratio (ref 0.0–4.4)
Cholesterol, Total: 212 mg/dL — ABNORMAL HIGH (ref 100–199)
HDL: 86 mg/dL (ref >39)
LDL Chol Calc (NIH): 111 mg/dL — ABNORMAL HIGH (ref 0–99)
Triglycerides: 84 mg/dL (ref 0–149)
VLDL Cholesterol Cal: 15 mg/dL (ref 5–40)

## 2021-08-03 ENCOUNTER — Other Ambulatory Visit: Payer: Self-pay

## 2021-08-03 ENCOUNTER — Ambulatory Visit (INDEPENDENT_AMBULATORY_CARE_PROVIDER_SITE_OTHER): Payer: Medicare Other | Admitting: Orthopedic Surgery

## 2021-08-03 ENCOUNTER — Encounter: Payer: Self-pay | Admitting: Orthopedic Surgery

## 2021-08-03 VITALS — BP 172/65 | HR 79 | Ht 61.0 in | Wt 210.0 lb

## 2021-08-03 DIAGNOSIS — G5602 Carpal tunnel syndrome, left upper limb: Secondary | ICD-10-CM | POA: Diagnosis not present

## 2021-08-03 NOTE — Progress Notes (Signed)
New Patient Visit ? ?Assessment: ?Melissa Zavala is a 81 y.o. LHD female with the following: ?1. Carpal tunnel syndrome, left upper limb ? ?Plan: ?Melissa Zavala has had numbness and tingling in her left hand for many years.  Recently, she has been having shooting pains, as well as burning sensation within the median nerve distribution.  On physical exam, she does have some atrophy of the thenar musculature.  Her symptoms are consistent with advanced carpal tunnel syndrome.  I would like to obtain an EMG in order to assess the severity.  This will allow me to better frame expectations for surgery.  We briefly discussed surgery including the procedure itself, as well as recovery.  All questions were answered.  She is amenable to this plan.  She is going out of town in a couple of weeks, and would like to get the EMGs done before she leaves town if possible.  We will see her in clinic after the EMG results are available. ? ? ?Follow-up: ?Return for After EMG. ? ?Subjective: ? ?Chief Complaint  ?Patient presents with  ? Hand Pain  ?  Lt hand pain for years, getting worse.   ? ? ?History of Present Illness: ?Melissa Zavala is a 81 y.o. female who has been referred to clinic today by Hendricks Limes, FNP for evaluation of left hand pain.  She states she has had pain, numbness and tingling in the left hand for many years.  Her symptoms get worse at night.  She has tried bracing in the past with limited improvement in her symptoms.  Over the last couple of months, she notes that she is having more intense pain, and a burning type sensation, specifically within her index finger.  Medications are not improving her symptoms.  She notes that she has difficulty picking up smaller objects such as coins or buttons.  Occasionally, she will drop items due to the symptoms she is having in her left hand. ? ? ?Review of Systems: ?No fevers or chills ?+ numbness and tingling ?No chest pain ?No shortness of breath ?No bowel or bladder  dysfunction ?No GI distress ?No headaches ? ? ?Medical History: ? ?Past Medical History:  ?Diagnosis Date  ? Arthritis   ? Hypertension   ? Mixed hyperlipidemia 06/14/2020  ? Osteoporosis   ? Vitamin B12 deficiency 12/06/2017  ? ? ?Past Surgical History:  ?Procedure Laterality Date  ? TOTAL HIP ARTHROPLASTY Right   ? TOTAL KNEE ARTHROPLASTY Bilateral   ? TUBAL LIGATION    ? ? ?Family History  ?Problem Relation Age of Onset  ? Breast cancer Mother   ? ?Social History  ? ?Tobacco Use  ? Smoking status: Never  ? Smokeless tobacco: Never  ?Vaping Use  ? Vaping Use: Never used  ?Substance Use Topics  ? Alcohol use: No  ? Drug use: No  ? ? ?Allergies  ?Allergen Reactions  ? Iodinated Contrast Media Anaphylaxis  ? Diphenhydramine Other (See Comments)  ?  Patient states "feels like somebody is inside of me clawing me out"  ? Codeine Other (See Comments)  ?  Hallucinations  ? Statins Other (See Comments)  ?  Joint pains and weakness.  ? Zetia [Ezetimibe] Other (See Comments)  ?  Aching in arms and legs.  ? ? ?Current Meds  ?Medication Sig  ? alendronate (FOSAMAX) 70 MG tablet Take 1 tablet (70 mg total) by mouth every 7 (seven) days. Take with a full glass of water on an empty stomach.  ?  amLODipine (NORVASC) 10 MG tablet Take 1 tablet (10 mg total) by mouth daily.  ? CALCIUM PO Take by mouth daily.  ? celecoxib (CELEBREX) 200 MG capsule 1 tablet daily  ? esomeprazole (NEXIUM) 40 MG capsule 1 tablet daily  ? furosemide (LASIX) 20 MG tablet Take 2 tablets (40 mg total) by mouth daily.  ? [START ON 09/25/2021] HYDROcodone-acetaminophen (NORCO) 5-325 MG tablet Take 1 tablet by mouth every 8 (eight) hours as needed for moderate pain.  ? [START ON 08/26/2021] HYDROcodone-acetaminophen (NORCO) 5-325 MG tablet Take 1 tablet by mouth every 8 (eight) hours as needed for moderate pain.  ? HYDROcodone-acetaminophen (NORCO) 5-325 MG tablet Take 1 tablet by mouth every 8 (eight) hours as needed for moderate pain.  ? levocetirizine (XYZAL)  5 MG tablet Take 1 tablet (5 mg total) by mouth every evening.  ? lisinopril (ZESTRIL) 40 MG tablet Take 1 tablet (40 mg total) by mouth daily.  ? potassium chloride (KLOR-CON) 10 MEQ tablet Take 1 tablet (10 mEq total) by mouth 2 (two) times daily.  ? VITAMIN D PO Take by mouth daily.  ? ? ?Objective: ?BP (!) 172/65   Pulse 79   Ht '5\' 1"'$  (1.549 m)   Wt 210 lb (95.3 kg)   BMI 39.68 kg/m?  ? ?Physical Exam: ? ?General: Alert and oriented. and No acute distress. ?Gait: Normal gait. ? ?Evaluation of left hand demonstrates no deformity.  She has decreased sensation within the index and long fingers.  Positive Tinel's at the carpal tunnel.  Positive carpal tunnel compression test.  Positive Phalen's.  There is atrophy noticeable within the thenar eminence musculature.  Strength is 4/5 in the left thumb, compared to the contralateral side. ? ?IMAGING: ?No new imaging obtained today ? ? ?New Medications:  ?No orders of the defined types were placed in this encounter. ? ? ? ? ?Mordecai Rasmussen, MD ? ?08/03/2021 ?9:42 AM ? ? ?

## 2021-08-08 ENCOUNTER — Other Ambulatory Visit: Payer: Self-pay

## 2021-08-08 ENCOUNTER — Ambulatory Visit
Admission: RE | Admit: 2021-08-08 | Discharge: 2021-08-08 | Disposition: A | Discharge status: Home / Self Care | Payer: Medicare Other | Source: Ambulatory Visit | Attending: Family Medicine | Admitting: Family Medicine

## 2021-08-08 DIAGNOSIS — Z1231 Encounter for screening mammogram for malignant neoplasm of breast: Secondary | ICD-10-CM

## 2021-08-10 ENCOUNTER — Other Ambulatory Visit: Payer: Self-pay | Admitting: Family Medicine

## 2021-08-10 DIAGNOSIS — R928 Other abnormal and inconclusive findings on diagnostic imaging of breast: Secondary | ICD-10-CM

## 2021-08-15 ENCOUNTER — Ambulatory Visit: Payer: Medicare Other

## 2021-08-15 ENCOUNTER — Ambulatory Visit
Admission: RE | Admit: 2021-08-15 | Discharge: 2021-08-15 | Disposition: A | Discharge status: Home / Self Care | Payer: Medicare Other | Source: Ambulatory Visit | Attending: Family Medicine | Admitting: Family Medicine

## 2021-08-15 ENCOUNTER — Other Ambulatory Visit: Payer: Self-pay | Admitting: Family Medicine

## 2021-08-15 DIAGNOSIS — R922 Inconclusive mammogram: Secondary | ICD-10-CM | POA: Diagnosis not present

## 2021-08-15 DIAGNOSIS — R928 Other abnormal and inconclusive findings on diagnostic imaging of breast: Secondary | ICD-10-CM

## 2021-08-16 ENCOUNTER — Ambulatory Visit: Payer: Medicare Other | Admitting: Physical Medicine and Rehabilitation

## 2021-09-08 DIAGNOSIS — R109 Unspecified abdominal pain: Secondary | ICD-10-CM | POA: Diagnosis not present

## 2021-09-08 DIAGNOSIS — M79605 Pain in left leg: Secondary | ICD-10-CM | POA: Diagnosis not present

## 2021-09-08 DIAGNOSIS — M79604 Pain in right leg: Secondary | ICD-10-CM | POA: Diagnosis not present

## 2021-09-08 DIAGNOSIS — M545 Low back pain, unspecified: Secondary | ICD-10-CM | POA: Diagnosis not present

## 2021-09-13 ENCOUNTER — Encounter: Payer: Medicare Other | Admitting: Physical Medicine and Rehabilitation

## 2021-09-14 ENCOUNTER — Encounter: Payer: Medicare Other | Admitting: Physical Medicine and Rehabilitation

## 2021-10-21 ENCOUNTER — Encounter: Payer: Medicare Other | Admitting: Physical Medicine and Rehabilitation

## 2021-10-27 ENCOUNTER — Ambulatory Visit (INDEPENDENT_AMBULATORY_CARE_PROVIDER_SITE_OTHER): Payer: Medicare Other | Admitting: Family Medicine

## 2021-10-27 ENCOUNTER — Encounter: Payer: Self-pay | Admitting: Family Medicine

## 2021-10-27 VITALS — BP 147/70 | HR 84 | Temp 98.2°F | Ht 61.0 in | Wt 205.8 lb

## 2021-10-27 DIAGNOSIS — J302 Other seasonal allergic rhinitis: Secondary | ICD-10-CM

## 2021-10-27 DIAGNOSIS — E669 Obesity, unspecified: Secondary | ICD-10-CM

## 2021-10-27 DIAGNOSIS — R0602 Shortness of breath: Secondary | ICD-10-CM

## 2021-10-27 DIAGNOSIS — E782 Mixed hyperlipidemia: Secondary | ICD-10-CM

## 2021-10-27 DIAGNOSIS — K219 Gastro-esophageal reflux disease without esophagitis: Secondary | ICD-10-CM

## 2021-10-27 DIAGNOSIS — M810 Age-related osteoporosis without current pathological fracture: Secondary | ICD-10-CM | POA: Diagnosis not present

## 2021-10-27 DIAGNOSIS — R8290 Unspecified abnormal findings in urine: Secondary | ICD-10-CM

## 2021-10-27 DIAGNOSIS — M159 Polyosteoarthritis, unspecified: Secondary | ICD-10-CM | POA: Diagnosis not present

## 2021-10-27 DIAGNOSIS — Z79899 Other long term (current) drug therapy: Secondary | ICD-10-CM

## 2021-10-27 DIAGNOSIS — I10 Essential (primary) hypertension: Secondary | ICD-10-CM | POA: Diagnosis not present

## 2021-10-27 DIAGNOSIS — R600 Localized edema: Secondary | ICD-10-CM

## 2021-10-27 DIAGNOSIS — R3989 Other symptoms and signs involving the genitourinary system: Secondary | ICD-10-CM

## 2021-10-27 LAB — URINALYSIS, ROUTINE W REFLEX MICROSCOPIC
Bilirubin, UA: NEGATIVE
Glucose, UA: NEGATIVE
Ketones, UA: NEGATIVE
Leukocytes,UA: NEGATIVE
Nitrite, UA: NEGATIVE
Protein,UA: NEGATIVE
RBC, UA: NEGATIVE
Specific Gravity, UA: 1.01 (ref 1.005–1.030)
Urobilinogen, Ur: 0.2 mg/dL (ref 0.2–1.0)
pH, UA: 5.5 (ref 5.0–7.5)

## 2021-10-27 MED ORDER — HYDROCODONE-ACETAMINOPHEN 5-325 MG PO TABS: 1.0000 | ORAL_TABLET | Freq: Three times a day (TID) | ORAL | 0 refills | Status: DC | PRN

## 2021-10-27 MED ORDER — ALENDRONATE SODIUM 70 MG PO TABS: 70.0000 mg | ORAL_TABLET | ORAL | 1 refills | Status: DC

## 2021-10-27 NOTE — Progress Notes (Unsigned)
Assessment & Plan:  1-2. Primary osteoarthritis involving multiple joints/Controlled substance agreement signed Well controlled on current regimen. Controlled substance agreement in place. Urine drug screen as expected. PDMP reviewed with no concerning findings.  - HYDROcodone-acetaminophen (NORCO) 5-325 MG tablet; Take 1 tablet by mouth every 8 (eight) hours as needed for moderate pain.  Dispense: 90 tablet; Refill: 0 - HYDROcodone-acetaminophen (NORCO) 5-325 MG tablet; Take 1 tablet by mouth every 8 (eight) hours as needed for moderate pain.  Dispense: 90 tablet; Refill: 0 - HYDROcodone-acetaminophen (NORCO) 5-325 MG tablet; Take 1 tablet by mouth every 8 (eight) hours as needed for moderate pain.  Dispense: 90 tablet; Refill: 0 - CMP14+EGFR  3. Age-related osteoporosis without current pathological fracture Continue current regimen. - alendronate (FOSAMAX) 70 MG tablet; Take 1 tablet (70 mg total) by mouth every 7 (seven) days. Take with a full glass of water on an empty stomach.  Dispense: 12 tablet; Refill: 1  4. Essential hypertension Well controlled on current regimen.  - CBC with Differential/Platelet - CMP14+EGFR - Lipid panel  5. Gastroesophageal reflux disease without esophagitis Well controlled on current regimen.  - CMP14+EGFR  6. Localized edema Well controlled on current regimen.  - CMP14+EGFR  7. Mixed hyperlipidemia - Lipid panel  8. Obesity (BMI 30-39.9) Encouraged healthy eating and exercise. - CBC with Differential/Platelet - CMP14+EGFR - Lipid panel - TSH  9. Seasonal allergies Well controlled on current regimen.   10. Exertional shortness of breath - CBC with Differential/Platelet - CMP14+EGFR - TSH - Brain natriuretic peptide  11. Abnormal urine odor - Urinalysis, Routine w reflex microscopic   Return in about 3 months (around 01/27/2022) for follow-up of chronic medication conditions.  Hendricks Limes, MSN, APRN, FNP-C [facility]  Subjective:    Patient ID: Melissa Zavala, female    DOB: 1941-02-20, 81 y.o.   MRN: [id number]  Patient Care Team: Loman Brooklyn, FNP as PCP - General (Family Medicine) Harl Bowie Alphonse Guild, MD as PCP - Cardiology (Cardiology) Ilean China, RN as Case Manager Vela Prose, MD as Surgeon (Student)   Chief Complaint:  Chief Complaint  Patient presents with   Medical Management of Chronic Issues   Shortness of Breath    Patient states when she walks she has been having SOB.    odor with urine    X 1 month on and off     HPI: Melissa Zavala is a 81 y.o. female presenting on 10/27/2021 for Medical Management of Chronic Issues, Shortness of Breath (Patient states when she walks she has been having SOB. ), and odor with urine (X 1 month on and off )  Pain assessment: Cause of pain- arthritis Pain location- legs & back Pain on scale of 1-10- 8/10 without medication; 3-4/10 with medication Frequency- comes and goes What increases pain- prolonged standing What makes pain better- rest and medication Effects on ADL- none Any change in general medical condition- none  Current opioids rx- Norco 5/325 mg Q8H PRN # meds rx- 90 Effectiveness of current meds- effective Adverse reactions form pain meds- none Morphine equivalent- 15 MME/day  Pill count performed-No Last drug screen - 04/28/2021 ( high risk q54m moderate risk q618mlow risk yearly ) Urine drug screen today- No Was the [place]- Yes  If yes were their any concerning findings? - No  Overdose risk: 160     12/02/2019    2:05 PM  Opioid Risk   Alcohol 0  Illegal Drugs 3  Rx Drugs 4  Alcohol 0  Illegal Drugs 0  Rx Drugs 0  Age between 16-45 years  0  History of Preadolescent Sexual Abuse 3  Psychological Disease 0  Depression 0  Opioid Risk Tool Scoring 10  Opioid Risk Interpretation High Risk   Pain contract signed on: 07/27/2021.   Hypertension: patient reports her average BP at  home is 130-140s/60-70s. She is taking her medication regularly. Patient is not exercising.   Edema: controlled with furosemide.  Hyperlipidemia: Patient has previously failed treatment with statins and zetia due to pain.   GERD: Taking Nexium daily.  Allergies: Taking Xyzal every evening.   New complaints: Patient reports shortness of breath when walking that started >/= two months ago. It has improved some over time. She has never been a smoker. Denies cough, wheezing, and chest pain. She did have an increase in lower extremity edema when she went to visit [place], but states her shortness of breath was not worse during this time.   She also reports an abnormal urine odor that is worse at night. She does have urgency and frequency, but denies discharge, fever, and dysuria.    Social history:  Relevant past medical, surgical, family and social history reviewed and updated as indicated. Interim medical history since our last visit reviewed.  Allergies and medications reviewed and updated.  DATA REVIEWED: CHART IN EPIC  ROS: Negative unless specifically indicated above in HPI.    Current Outpatient Medications:    alendronate (FOSAMAX) 70 MG tablet, Take 1 tablet (70 mg total) by mouth every 7 (seven) days. Take with a full glass of water on an empty stomach., Disp: 4 tablet, Rfl: 5   amLODipine (NORVASC) 10 MG tablet, Take 1 tablet (10 mg total) by mouth daily., Disp: 90 tablet, Rfl: 1   CALCIUM PO, Take by mouth daily., Disp: , Rfl:    celecoxib (CELEBREX) 200 MG capsule, 1 tablet daily, Disp: 90 capsule, Rfl: 1   esomeprazole (NEXIUM) 40 MG capsule, 1 tablet daily, Disp: 90 capsule, Rfl: 1   furosemide (LASIX) 20 MG tablet, Take 2 tablets (40 mg total) by mouth daily., Disp: 180 tablet, Rfl: 1   HYDROcodone-acetaminophen (NORCO) 5-325 MG tablet, Take 1 tablet by mouth every 8 (eight) hours as needed for moderate pain., Disp: 90 tablet, Rfl: 0   HYDROcodone-acetaminophen  (NORCO) 5-325 MG tablet, Take 1 tablet by mouth every 8 (eight) hours as needed for moderate pain., Disp: 90 tablet, Rfl: 0   HYDROcodone-acetaminophen (NORCO) 5-325 MG tablet, Take 1 tablet by mouth every 8 (eight) hours as needed for moderate pain., Disp: 90 tablet, Rfl: 0   levocetirizine (XYZAL) 5 MG tablet, Take 1 tablet (5 mg total) by mouth every evening., Disp: 90 tablet, Rfl: 1   lisinopril (ZESTRIL) 40 MG tablet, Take 1 tablet (40 mg total) by mouth daily., Disp: 90 tablet, Rfl: 1   potassium chloride (KLOR-CON) 10 MEQ tablet, Take 1 tablet (10 mEq total) by mouth 2 (two) times daily., Disp: 180 tablet, Rfl: 1   VITAMIN D PO, Take by mouth daily. (Patient not taking: Reported on 10/27/2021), Disp: , Rfl:    Allergies  Allergen Reactions   Iodinated Contrast Media Anaphylaxis   Diphenhydramine Other (See Comments)    Patient states "feels like somebody is inside of me clawing me out"   Codeine Other (See Comments)    Hallucinations   Statins Other (See Comments)    Joint pains and weakness. Joint pains and weakness. Joint pains  and weakness.   Zetia [Ezetimibe] Other (See Comments)    Aching in arms and legs.   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   Osteoporosis    Vitamin B12 deficiency 12/06/2017    Past Surgical History:  Procedure Laterality Date   TOTAL HIP ARTHROPLASTY Right    TOTAL KNEE ARTHROPLASTY Bilateral    TUBAL LIGATION      Social History   Socioeconomic History   Marital status: Divorced    Spouse name: Not on file   Number of children: 7   Years of education: Not on file   Highest education level: 8th grade  Occupational History   Occupation: Retired  Tobacco Use   Smoking status: Never   Smokeless tobacco: Never  Vaping Use   Vaping Use: Never used  Substance and Sexual Activity   Alcohol use: No   Drug use: No   Sexual activity: Not Currently  Other Topics Concern   Not  on file  Social History Narrative   Lives alone - all of her children live nearby   Social Determinants of Health   Financial Resource Strain: High Risk (12/21/2020)   Overall Financial Resource Strain (CARDIA)    Difficulty of Paying Living Expenses: Hard  Food Insecurity: No Food Insecurity (12/21/2020)   Hunger Vital Sign    Worried About Running Out of Food in the Last Year: Never true    [place] in the Last Year: Never true  Transportation Needs: No Transportation Needs (12/21/2020)   PRAPARE - Hydrologist (Medical): No    Lack of Transportation (Non-Medical): No  Physical Activity: Sufficiently Active (12/21/2020)   Exercise Vital Sign    Days of Exercise per Week: 5 days    Minutes of Exercise per Session: 30 min  Stress: No Stress Concern Present (12/21/2020)   [place]    Feeling of Stress : Only a little  Social Connections: Moderately Integrated (12/21/2020)   Social Connection and Isolation Panel [NHANES]    Frequency of Communication with Friends and Family: More than three times a week    Frequency of Social Gatherings with Friends and Family: More than three times a week    Attends Religious Services: More than 4 times per year    Active Member of [employer] or Organizations: Yes    Attends Archivist Meetings: More than 4 times per year    Marital Status: Separated  Intimate Partner Violence: Not At Risk (12/21/2020)   Humiliation, Afraid, Rape, and Kick questionnaire    Fear of Current or Ex-Partner: No    Emotionally Abused: No    Physically Abused: No    Sexually Abused: No        Objective:    BP (!) 147/70   Pulse 84   Temp 98.2 F (36.8 C) (Temporal)   Ht 5' 1" (1.549 m)   Wt 205 lb 12.8 oz (93.4 kg)   SpO2 94%   BMI 38.89 kg/m   Wt Readings from Last 3 Encounters:  10/27/21 205 lb 12.8 oz (93.4 kg)  08/03/21 210 lb (95.3 kg)  07/27/21 207 lb  6.4 oz (94.1 kg)   Physical Exam Vitals reviewed.  Constitutional:      General: She is not in acute distress.    Appearance: Normal appearance. She is obese. She is not ill-appearing,  toxic-appearing or diaphoretic.  HENT:     Head: Normocephalic and atraumatic.  Eyes:     General: No scleral icterus.       Right eye: No discharge.        Left eye: No discharge.     Conjunctiva/sclera: Conjunctivae normal.  Cardiovascular:     Rate and Rhythm: Normal rate and regular rhythm.     Heart sounds: Normal heart sounds. No murmur heard.    No friction rub. No gallop.  Pulmonary:     Effort: Pulmonary effort is normal. No respiratory distress.     Breath sounds: Normal breath sounds. No stridor. No wheezing, rhonchi or rales.  Musculoskeletal:        General: Normal range of motion.     Cervical back: Normal range of motion.     Thoracic back: Tenderness present.  Skin:    General: Skin is warm and dry.     Capillary Refill: Capillary refill takes less than 2 seconds.  Neurological:     General: No focal deficit present.     Mental Status: She is alert and oriented to person, place, and time. Mental status is at baseline.  Psychiatric:        Mood and Affect: Mood normal.        Behavior: Behavior normal.        Thought Content: Thought content normal.        Judgment: Judgment normal.    Lab Results  Component Value Date   TSH 0.536 06/27/2019   Lab Results  Component Value Date   WBC 8.5 07/27/2021   HGB 11.7 07/27/2021   HCT 36.4 07/27/2021   MCV 84 07/27/2021   PLT 252 07/27/2021   Lab Results  Component Value Date   NA 145 (H) 07/27/2021   K 4.1 07/27/2021   CO2 25 07/27/2021   GLUCOSE 94 07/27/2021   BUN 12 07/27/2021   CREATININE 0.67 07/27/2021   BILITOT 0.4 07/27/2021   ALKPHOS 78 07/27/2021   AST 12 07/27/2021   ALT 9 07/27/2021   PROT 6.5 07/27/2021   ALBUMIN 4.5 07/27/2021   CALCIUM 9.9 07/27/2021   ANIONGAP 8 07/18/2016   EGFR 88 07/27/2021    Lab Results  Component Value Date   CHOL 212 (H) 07/27/2021   Lab Results  Component Value Date   HDL 86 07/27/2021   Lab Results  Component Value Date   LDLCALC 111 (H) 07/27/2021   Lab Results  Component Value Date   TRIG 84 07/27/2021   Lab Results  Component Value Date   CHOLHDL 2.5 07/27/2021   Lab Results  Component Value Date   HGBA1C 5.8 06/27/2019

## 2021-10-28 ENCOUNTER — Ambulatory Visit (INDEPENDENT_AMBULATORY_CARE_PROVIDER_SITE_OTHER): Payer: Medicare Other | Admitting: Physical Medicine and Rehabilitation

## 2021-10-28 ENCOUNTER — Encounter: Payer: Self-pay | Admitting: Physical Medicine and Rehabilitation

## 2021-10-28 DIAGNOSIS — R202 Paresthesia of skin: Secondary | ICD-10-CM

## 2021-10-28 LAB — TSH: TSH: 0.332 u[IU]/mL — ABNORMAL LOW (ref 0.450–4.500)

## 2021-10-28 LAB — CBC WITH DIFFERENTIAL/PLATELET
Basophils Absolute: 0 10*3/uL (ref 0.0–0.2)
Basos: 0 %
EOS (ABSOLUTE): 0.2 10*3/uL (ref 0.0–0.4)
Eos: 2 %
Hematocrit: 36 % (ref 34.0–46.6)
Hemoglobin: 11.8 g/dL (ref 11.1–15.9)
Immature Grans (Abs): 0 10*3/uL (ref 0.0–0.1)
Immature Granulocytes: 0 %
Lymphocytes Absolute: 2.6 10*3/uL (ref 0.7–3.1)
Lymphs: 32 %
MCH: 27.1 pg (ref 26.6–33.0)
MCHC: 32.8 g/dL (ref 31.5–35.7)
MCV: 83 fL (ref 79–97)
Monocytes Absolute: 0.5 10*3/uL (ref 0.1–0.9)
Monocytes: 6 %
Neutrophils Absolute: 5 10*3/uL (ref 1.4–7.0)
Neutrophils: 60 %
Platelets: 260 10*3/uL (ref 150–450)
RBC: 4.35 x10E6/uL (ref 3.77–5.28)
RDW: 14.1 % (ref 11.7–15.4)
WBC: 8.3 10*3/uL (ref 3.4–10.8)

## 2021-10-28 LAB — LIPID PANEL
Chol/HDL Ratio: 2.4 ratio (ref 0.0–4.4)
Cholesterol, Total: 209 mg/dL — ABNORMAL HIGH (ref 100–199)
HDL: 88 mg/dL (ref >39)
LDL Chol Calc (NIH): 103 mg/dL — ABNORMAL HIGH (ref 0–99)
Triglycerides: 102 mg/dL (ref 0–149)
VLDL Cholesterol Cal: 18 mg/dL (ref 5–40)

## 2021-10-28 LAB — CMP14+EGFR
ALT: 10 IU/L (ref 0–32)
AST: 17 IU/L (ref 0–40)
Albumin/Globulin Ratio: 2 (ref 1.2–2.2)
Albumin: 4.4 g/dL (ref 3.7–4.7)
Alkaline Phosphatase: 81 IU/L (ref 44–121)
BUN/Creatinine Ratio: 15 (ref 12–28)
BUN: 12 mg/dL (ref 8–27)
Bilirubin Total: 0.3 mg/dL (ref 0.0–1.2)
CO2: 22 mmol/L (ref 20–29)
Calcium: 9.7 mg/dL (ref 8.7–10.3)
Chloride: 105 mmol/L (ref 96–106)
Creatinine, Ser: 0.8 mg/dL (ref 0.57–1.00)
Globulin, Total: 2.2 g/dL (ref 1.5–4.5)
Glucose: 98 mg/dL (ref 70–99)
Potassium: 3.4 mmol/L — ABNORMAL LOW (ref 3.5–5.2)
Sodium: 141 mmol/L (ref 134–144)
Total Protein: 6.6 g/dL (ref 6.0–8.5)
eGFR: 74 mL/min/{1.73_m2} (ref >59)

## 2021-10-28 LAB — BRAIN NATRIURETIC PEPTIDE: BNP: 49.8 pg/mL (ref 0.0–100.0)

## 2021-10-28 NOTE — Progress Notes (Unsigned)
Pt state left hand numbness and left index finger burning. Pt state it hard for her to grip items and she has been dropping items. Pt state she takes pain meds to help ease her pain. Pt state she left handed.  Numeric Pain Rating Scale and Functional Assessment Average Pain 2   In the last MONTH (on 0-10 scale) has pain interfered with the following?  1. General activity like being  able to carry out your everyday physical activities such as walking, climbing stairs, carrying groceries, or moving a chair?  Rating(10)    -BT, -Dye Allergies.

## 2021-10-30 MED ORDER — ALBUTEROL SULFATE HFA 108 (90 BASE) MCG/ACT IN AERS: 2.0000 | INHALATION_SPRAY | Freq: Four times a day (QID) | RESPIRATORY_TRACT | 0 refills | Status: DC | PRN

## 2021-11-01 NOTE — Procedures (Signed)
EMG & NCV Findings: Evaluation of the left median motor nerve showed prolonged distal onset latency (4.8 ms), reduced amplitude (0.1 mV), and decreased conduction velocity (Elbow-Wrist, 30 m/s).  The left median (across palm) sensory nerve showed no response (Wrist) and no response (Palm).  The left ulnar sensory nerve showed reduced amplitude (13.3 V).  All remaining nerves (as indicated in the following tables) were within normal limits.    Needle evaluation of the left abductor pollicis brevis muscle showed increased insertional activity, increased spontaneous activity, and diminished recruitment.  All remaining muscles (as indicated in the following table) showed no evidence of electrical instability.    Impression: The above electrodiagnostic study is ABNORMAL and reveals evidence of a severe left median nerve entrapment at the wrist (carpal tunnel syndrome) affecting sensory and motor components. The lesion is characterized by sensory and motor demyelination with evidence of significant axonal injury.   There is no significant electrodiagnostic evidence of any other focal nerve entrapment, brachial plexopathy or cervical radiculopathy.   Recommendations: 1.  Follow-up with referring physician. 2.  Continue current management of symptoms. 3.  Suggest surgical evaluation.  ___________________________ Laurence Spates FAAPMR Board Certified, American Board of Physical Medicine and Rehabilitation    Nerve Conduction Studies Anti Sensory Summary Table   Stim Site NR Peak (ms) Norm Peak (ms) P-T Amp (V) Norm P-T Amp Site1 Site2 Delta-P (ms) Dist (cm) Vel (m/s) Norm Vel (m/s)  Left Median Acr Palm Anti Sensory (2nd Digit)  32.4C  Wrist *NR  <3.6  >10 Wrist Palm  0.0    Palm *NR  <2.0          Left Radial Anti Sensory (Base 1st Digit)  32.1C  Wrist    2.0 <3.1 30.6  Wrist Base 1st Digit 2.0 0.0    Left Ulnar Anti Sensory (5th Digit)  32.6C  Wrist    3.3 <3.7 *13.3 >15.0 Wrist 5th Digit  3.3 14.0 42 >38   Motor Summary Table   Stim Site NR Onset (ms) Norm Onset (ms) O-P Amp (mV) Norm O-P Amp Site1 Site2 Delta-0 (ms) Dist (cm) Vel (m/s) Norm Vel (m/s)  Left Median Motor (Abd Poll Brev)  32.2C  Wrist    *4.8 <4.2 *0.1 >5 Elbow Wrist 6.9 21.0 *30 >50  Elbow    11.7  0.0         Left Ulnar Motor (Abd Dig Min)  32.4C  Wrist    3.0 <4.2 8.8 >3 B Elbow Wrist 2.9 18.5 64 >53  B Elbow    5.9  8.1  A Elbow B Elbow 1.2 9.5 79 >53  A Elbow    7.1  7.5          EMG   Side Muscle Nerve Root Ins Act Fibs Psw Amp Dur Poly Recrt Int Fraser Din Comment  Left Abd Poll Brev Median C8-T1 *Incr *3+ *3+ Nml Nml 0 *Reduced Nml   Left 1stDorInt Ulnar C8-T1 Nml Nml Nml Nml Nml 0 Nml Nml   Left PronatorTeres Median C6-7 Nml Nml Nml Nml Nml 0 Nml Nml   Left Biceps Musculocut C5-6 Nml Nml Nml Nml Nml 0 Nml Nml   Left Deltoid Axillary C5-6 Nml Nml Nml Nml Nml 0 Nml Nml     Nerve Conduction Studies Anti Sensory Left/Right Comparison   Stim Site L Lat (ms) R Lat (ms) L-R Lat (ms) L Amp (V) R Amp (V) L-R Amp (%) Site1 Site2 L Vel (m/s) R Vel (m/s) L-R Vel (m/s)  Median Acr Palm Anti Sensory (2nd Digit)  32.4C  Wrist       Wrist Palm     Palm             Radial Anti Sensory (Base 1st Digit)  32.1C  Wrist 2.0   30.6   Wrist Base 1st Digit     Ulnar Anti Sensory (5th Digit)  32.6C  Wrist 3.3   *13.3   Wrist 5th Digit 42     Motor Left/Right Comparison   Stim Site L Lat (ms) R Lat (ms) L-R Lat (ms) L Amp (mV) R Amp (mV) L-R Amp (%) Site1 Site2 L Vel (m/s) R Vel (m/s) L-R Vel (m/s)  Median Motor (Abd Poll Brev)  32.2C  Wrist *4.8   *0.1   Elbow Wrist *30    Elbow 11.7   0.0         Ulnar Motor (Abd Dig Min)  32.4C  Wrist 3.0   8.8   B Elbow Wrist 64    B Elbow 5.9   8.1   A Elbow B Elbow 79    A Elbow 7.1   7.5            Waveforms:

## 2021-11-01 NOTE — Progress Notes (Signed)
Melissa Zavala - 81 y.o. female MRN [id number]  Date of birth: 16-Aug-1940  Office Visit Note: Visit Date: 10/28/2021 PCP: Loman Brooklyn, FNP Referred by: Mordecai Rasmussen, MD  Subjective: Chief Complaint  Patient presents with   Left Hand - Pain, Numbness, Burn   HPI:  Melissa Zavala is a 81 y.o. female who comes in today at the request of Dr. Larena Glassman for electrodiagnostic study of the Left upper extremities.  Patient is Left hand dominant.  She reports chronic worsening severe left hand paresthesia and numbness particularly in the index finger but most of the radial digits.  She reports difficulty gripping objects and is reported strength loss.  She does endorse dropping items.  She has had worsening from nocturnal complaints to more constant complaints.  She does use pain medication.  She has no history of electrodiagnostic study.  She does not complain of any frank radicular symptoms.  No history of diabetes.   ROS Otherwise per HPI.  Assessment & Plan: Visit Diagnoses:    ICD-10-CM   1. Paresthesia of skin  R20.2 NCV with EMG (electromyography)      Plan: Impression: The above electrodiagnostic study is ABNORMAL and reveals evidence of a severe left median nerve entrapment at the wrist (carpal tunnel syndrome) affecting sensory and motor components. The lesion is characterized by sensory and motor demyelination with evidence of significant axonal injury.   There is no significant electrodiagnostic evidence of any other focal nerve entrapment, brachial plexopathy or cervical radiculopathy.   Recommendations: 1.  Follow-up with referring physician. 2.  Continue current management of symptoms. 3.  Suggest surgical evaluation.  Meds & Orders: No orders of the defined types were placed in this encounter.   Orders Placed This Encounter  Procedures   NCV with EMG (electromyography)    Follow-up: Return in about 2 weeks (around 11/11/2021) for Larena Glassman, MD.   Procedures: No  procedures performed  EMG & NCV Findings: Evaluation of the left median motor nerve showed prolonged distal onset latency (4.8 ms), reduced amplitude (0.1 mV), and decreased conduction velocity (Elbow-Wrist, 30 m/s).  The left median (across palm) sensory nerve showed no response (Wrist) and no response (Palm).  The left ulnar sensory nerve showed reduced amplitude (13.3 V).  All remaining nerves (as indicated in the following tables) were within normal limits.    Needle evaluation of the left abductor pollicis brevis muscle showed increased insertional activity, increased spontaneous activity, and diminished recruitment.  All remaining muscles (as indicated in the following table) showed no evidence of electrical instability.    Impression: The above electrodiagnostic study is ABNORMAL and reveals evidence of a severe left median nerve entrapment at the wrist (carpal tunnel syndrome) affecting sensory and motor components. The lesion is characterized by sensory and motor demyelination with evidence of significant axonal injury.   There is no significant electrodiagnostic evidence of any other focal nerve entrapment, brachial plexopathy or cervical radiculopathy.   Recommendations: 1.  Follow-up with referring physician. 2.  Continue current management of symptoms. 3.  Suggest surgical evaluation.  ___________________________ Laurence Spates FAAPMR Board Certified, American Board of Physical Medicine and Rehabilitation    Nerve Conduction Studies Anti Sensory Summary Table   Stim Site NR Peak (ms) Norm Peak (ms) P-T Amp (V) Norm P-T Amp Site1 Site2 Delta-P (ms) Dist (cm) Vel (m/s) Norm Vel (m/s)  Left Median Acr Palm Anti Sensory (2nd Digit)  32.4C  Wrist *NR  <3.6  >10 Wrist Palm  0.0    Palm *NR  <2.0          Left Radial Anti Sensory (Base 1st Digit)  32.1C  Wrist    2.0 <3.1 30.6  Wrist Base 1st Digit 2.0 0.0    Left Ulnar Anti Sensory (5th Digit)  32.6C  Wrist    3.3 <3.7 *13.3  >15.0 Wrist 5th Digit 3.3 14.0 42 >38   Motor Summary Table   Stim Site NR Onset (ms) Norm Onset (ms) O-P Amp (mV) Norm O-P Amp Site1 Site2 Delta-0 (ms) Dist (cm) Vel (m/s) Norm Vel (m/s)  Left Median Motor (Abd Poll Brev)  32.2C  Wrist    *4.8 <4.2 *0.1 >5 Elbow Wrist 6.9 21.0 *30 >50  Elbow    11.7  0.0         Left Ulnar Motor (Abd Dig Min)  32.4C  Wrist    3.0 <4.2 8.8 >3 B Elbow Wrist 2.9 18.5 64 >53  B Elbow    5.9  8.1  A Elbow B Elbow 1.2 9.5 79 >53  A Elbow    7.1  7.5          EMG   Side Muscle Nerve Root Ins Act Fibs Psw Amp Dur Poly Recrt Int Fraser Din Comment  Left Abd Poll Brev Median C8-T1 *Incr *3+ *3+ Nml Nml 0 *Reduced Nml   Left 1stDorInt Ulnar C8-T1 Nml Nml Nml Nml Nml 0 Nml Nml   Left PronatorTeres Median C6-7 Nml Nml Nml Nml Nml 0 Nml Nml   Left Biceps Musculocut C5-6 Nml Nml Nml Nml Nml 0 Nml Nml   Left Deltoid Axillary C5-6 Nml Nml Nml Nml Nml 0 Nml Nml     Nerve Conduction Studies Anti Sensory Left/Right Comparison   Stim Site L Lat (ms) R Lat (ms) L-R Lat (ms) L Amp (V) R Amp (V) L-R Amp (%) Site1 Site2 L Vel (m/s) R Vel (m/s) L-R Vel (m/s)  Median Acr Palm Anti Sensory (2nd Digit)  32.4C  Wrist       Wrist Palm     Palm             Radial Anti Sensory (Base 1st Digit)  32.1C  Wrist 2.0   30.6   Wrist Base 1st Digit     Ulnar Anti Sensory (5th Digit)  32.6C  Wrist 3.3   *13.3   Wrist 5th Digit 42     Motor Left/Right Comparison   Stim Site L Lat (ms) R Lat (ms) L-R Lat (ms) L Amp (mV) R Amp (mV) L-R Amp (%) Site1 Site2 L Vel (m/s) R Vel (m/s) L-R Vel (m/s)  Median Motor (Abd Poll Brev)  32.2C  Wrist *4.8   *0.1   Elbow Wrist *30    Elbow 11.7   0.0         Ulnar Motor (Abd Dig Min)  32.4C  Wrist 3.0   8.8   B Elbow Wrist 64    B Elbow 5.9   8.1   A Elbow B Elbow 79    A Elbow 7.1   7.5            Waveforms:             Clinical History: No specialty comments available.     Objective:  VS:  HT:    WT:   BMI:     BP:   HR: bpm   TEMP: ( )  RESP:  Physical Exam Musculoskeletal:  General: No swelling, tenderness or deformity.     Comments: Inspection reveals atrophy of the left APB but no atrophy of the bilateral FDI or hand intrinsics. There is no swelling, color changes, allodynia or dystrophic changes. There is 5 out of 5 strength in the bilateral wrist extension, finger abduction and long finger flexion.  There is decreased sensation to light touch in a classic median nerve distribution on the left hand. There is a negative Hoffmann's test bilaterally.  Skin:    General: Skin is warm and dry.     Findings: No erythema or rash.  Neurological:     General: No focal deficit present.     Mental Status: She is alert and oriented to person, place, and time.     Motor: No weakness or abnormal muscle tone.     Coordination: Coordination normal.  Psychiatric:        Mood and Affect: Mood normal.        Behavior: Behavior normal.      Imaging: No results found.

## 2021-11-27 ENCOUNTER — Emergency Department (HOSPITAL_COMMUNITY)
Admission: EM | Admit: 2021-11-27 | Discharge: 2021-11-27 | Disposition: A | Discharge status: Home / Self Care | Payer: Medicare Other | Attending: Emergency Medicine | Admitting: Emergency Medicine

## 2021-11-27 ENCOUNTER — Emergency Department (HOSPITAL_COMMUNITY): Payer: Medicare Other

## 2021-11-27 ENCOUNTER — Encounter (HOSPITAL_COMMUNITY): Payer: Self-pay

## 2021-11-27 ENCOUNTER — Other Ambulatory Visit: Payer: Self-pay

## 2021-11-27 DIAGNOSIS — K5730 Diverticulosis of large intestine without perforation or abscess without bleeding: Secondary | ICD-10-CM | POA: Diagnosis not present

## 2021-11-27 DIAGNOSIS — R109 Unspecified abdominal pain: Secondary | ICD-10-CM | POA: Diagnosis not present

## 2021-11-27 DIAGNOSIS — I10 Essential (primary) hypertension: Secondary | ICD-10-CM | POA: Diagnosis not present

## 2021-11-27 DIAGNOSIS — R0789 Other chest pain: Secondary | ICD-10-CM | POA: Diagnosis not present

## 2021-11-27 DIAGNOSIS — M545 Low back pain, unspecified: Secondary | ICD-10-CM | POA: Insufficient documentation

## 2021-11-27 DIAGNOSIS — R1013 Epigastric pain: Secondary | ICD-10-CM | POA: Diagnosis not present

## 2021-11-27 DIAGNOSIS — K219 Gastro-esophageal reflux disease without esophagitis: Secondary | ICD-10-CM | POA: Insufficient documentation

## 2021-11-27 DIAGNOSIS — R079 Chest pain, unspecified: Secondary | ICD-10-CM | POA: Diagnosis not present

## 2021-11-27 DIAGNOSIS — D649 Anemia, unspecified: Secondary | ICD-10-CM | POA: Diagnosis not present

## 2021-11-27 DIAGNOSIS — K8689 Other specified diseases of pancreas: Secondary | ICD-10-CM | POA: Diagnosis not present

## 2021-11-27 DIAGNOSIS — Z79899 Other long term (current) drug therapy: Secondary | ICD-10-CM | POA: Diagnosis not present

## 2021-11-27 DIAGNOSIS — M1612 Unilateral primary osteoarthritis, left hip: Secondary | ICD-10-CM | POA: Diagnosis not present

## 2021-11-27 DIAGNOSIS — K828 Other specified diseases of gallbladder: Secondary | ICD-10-CM | POA: Diagnosis not present

## 2021-11-27 LAB — CBC WITH DIFFERENTIAL/PLATELET
Abs Immature Granulocytes: 0.02 10*3/uL (ref 0.00–0.07)
Basophils Absolute: 0 10*3/uL (ref 0.0–0.1)
Basophils Relative: 0 %
Eosinophils Absolute: 0.1 10*3/uL (ref 0.0–0.5)
Eosinophils Relative: 2 %
HCT: 37 % (ref 36.0–46.0)
Hemoglobin: 11.8 g/dL — ABNORMAL LOW (ref 12.0–15.0)
Immature Granulocytes: 0 %
Lymphocytes Relative: 28 %
Lymphs Abs: 1.9 10*3/uL (ref 0.7–4.0)
MCH: 26.9 pg (ref 26.0–34.0)
MCHC: 31.9 g/dL (ref 30.0–36.0)
MCV: 84.3 fL (ref 80.0–100.0)
Monocytes Absolute: 0.3 10*3/uL (ref 0.1–1.0)
Monocytes Relative: 5 %
Neutro Abs: 4.3 10*3/uL (ref 1.7–7.7)
Neutrophils Relative %: 65 %
Platelets: 252 10*3/uL (ref 150–400)
RBC: 4.39 MIL/uL (ref 3.87–5.11)
RDW: 14.2 % (ref 11.5–15.5)
WBC: 6.7 10*3/uL (ref 4.0–10.5)
nRBC: 0 % (ref 0.0–0.2)

## 2021-11-27 LAB — COMPREHENSIVE METABOLIC PANEL
ALT: 12 U/L (ref 0–44)
AST: 14 U/L — ABNORMAL LOW (ref 15–41)
Albumin: 4 g/dL (ref 3.5–5.0)
Alkaline Phosphatase: 66 U/L (ref 38–126)
Anion gap: 7 (ref 5–15)
BUN: 12 mg/dL (ref 8–23)
CO2: 26 mmol/L (ref 22–32)
Calcium: 9.6 mg/dL (ref 8.9–10.3)
Chloride: 106 mmol/L (ref 98–111)
Creatinine, Ser: 0.62 mg/dL (ref 0.44–1.00)
GFR, Estimated: >60 mL/min (ref >60)
Glucose, Bld: 100 mg/dL — ABNORMAL HIGH (ref 70–99)
Potassium: 3.7 mmol/L (ref 3.5–5.1)
Sodium: 139 mmol/L (ref 135–145)
Total Bilirubin: 0.8 mg/dL (ref 0.3–1.2)
Total Protein: 7 g/dL (ref 6.5–8.1)

## 2021-11-27 LAB — URINALYSIS, ROUTINE W REFLEX MICROSCOPIC
Bilirubin Urine: NEGATIVE
Glucose, UA: NEGATIVE mg/dL
Hgb urine dipstick: NEGATIVE
Ketones, ur: NEGATIVE mg/dL
Leukocytes,Ua: NEGATIVE
Nitrite: NEGATIVE
Protein, ur: NEGATIVE mg/dL
Specific Gravity, Urine: 1.01 (ref 1.005–1.030)
pH: 7 (ref 5.0–8.0)

## 2021-11-27 LAB — TROPONIN I (HIGH SENSITIVITY)
Troponin I (High Sensitivity): 10 ng/L (ref <18)
Troponin I (High Sensitivity): 10 ng/L (ref <18)

## 2021-11-27 MED ORDER — OXYCODONE-ACETAMINOPHEN 5-325 MG PO TABS
1.0000 | ORAL_TABLET | Freq: Once | ORAL | Status: AC
  Administered 2021-11-27: 1 via ORAL
  Filled 2021-11-27: qty 1

## 2021-11-27 NOTE — Discharge Instructions (Signed)
Continue taking your hydrocodone for your back pain.  Follow-up with your family doctor in a week for recheck and you are being referred to a cardiologist either here in [place] or in [place]

## 2021-11-27 NOTE — ED Triage Notes (Signed)
Pt reports lower back pain. Reports CP yesterday. Denies CP today. Reports body aches.

## 2021-11-27 NOTE — ED Provider Notes (Signed)
[facility] Provider Note   CSN: [id number] Arrival date & time: 11/27/21  1200     History  No chief complaint on file.   Melissa Zavala is a 81 y.o. female.  Patient complains of chest pain and left flank pain.  Patient has a history of hypertension GERD and chronic pain  The history is provided by medical records and the patient. A language interpreter was used.  Back Pain Location:  Lumbar spine Quality:  Aching Pain severity:  Moderate Pain is:  Worse during the day Onset quality:  Sudden Timing:  Constant Progression:  Waxing and waning Chronicity:  New Context: not emotional stress   Associated symptoms: no abdominal pain, no chest pain and no headaches        Home Medications Prior to Admission medications   Medication Sig Start Date End Date Taking? Authorizing Provider  albuterol (VENTOLIN HFA) 108 (90 Base) MCG/ACT inhaler Inhale 2 puffs into the lungs every 6 (six) hours as needed for wheezing or shortness of breath. 10/30/21   Loman Brooklyn, FNP  alendronate (FOSAMAX) 70 MG tablet Take 1 tablet (70 mg total) by mouth every 7 (seven) days. Take with a full glass of water on an empty stomach. 10/27/21   Loman Brooklyn, FNP  amLODipine (NORVASC) 10 MG tablet Take 1 tablet (10 mg total) by mouth daily. 07/27/21   Loman Brooklyn, FNP  CALCIUM PO Take by mouth daily.    [provider]  celecoxib (CELEBREX) 200 MG capsule 1 tablet daily 07/27/21   Loman Brooklyn, FNP  esomeprazole (NEXIUM) 40 MG capsule 1 tablet daily 07/27/21   Loman Brooklyn, FNP  furosemide (LASIX) 20 MG tablet Take 2 tablets (40 mg total) by mouth daily. 07/27/21   Loman Brooklyn, FNP  HYDROcodone-acetaminophen (NORCO) 5-325 MG tablet Take 1 tablet by mouth every 8 (eight) hours as needed for moderate pain. 12/26/21   Loman Brooklyn, FNP  HYDROcodone-acetaminophen (NORCO) 5-325 MG tablet Take 1 tablet by mouth every 8 (eight) hours as needed for moderate pain.  11/26/21   Loman Brooklyn, FNP  HYDROcodone-acetaminophen (NORCO) 5-325 MG tablet Take 1 tablet by mouth every 8 (eight) hours as needed for moderate pain. 10/27/21   Loman Brooklyn, FNP  levocetirizine (XYZAL) 5 MG tablet Take 1 tablet (5 mg total) by mouth every evening. 07/27/21   Loman Brooklyn, FNP  lisinopril (ZESTRIL) 40 MG tablet Take 1 tablet (40 mg total) by mouth daily. 07/27/21   Loman Brooklyn, FNP  potassium chloride (KLOR-CON) 10 MEQ tablet Take 1 tablet (10 mEq total) by mouth 2 (two) times daily. 07/27/21   Loman Brooklyn, FNP  VITAMIN D PO Take by mouth daily.    [provider]      Allergies    Iodinated contrast media, Diphenhydramine, Codeine, Statins, and Zetia [ezetimibe]    Review of Systems   Review of Systems  Constitutional:  Negative for appetite change and fatigue.  HENT:  Negative for congestion, ear discharge and sinus pressure.   Eyes:  Negative for discharge.  Respiratory:  Negative for cough.   Cardiovascular:  Negative for chest pain.  Gastrointestinal:  Negative for abdominal pain and diarrhea.  Genitourinary:  Negative for frequency and hematuria.  Musculoskeletal:  Positive for back pain.  Skin:  Negative for rash.  Neurological:  Negative for seizures and headaches.  Psychiatric/Behavioral:  Negative for hallucinations.     Physical Exam Updated Vital  Signs BP (!) 145/69   Pulse 64   Temp 98.2 F (36.8 C) (Oral)   Resp 17   Ht '5\' 1"'$  (1.549 m)   Wt 93 kg   SpO2 100%   BMI 38.73 kg/m  Physical Exam Vitals and nursing note reviewed.  Constitutional:      Appearance: She is well-developed.  HENT:     Head: Normocephalic.     Nose: Nose normal.  Eyes:     General: No scleral icterus.    Conjunctiva/sclera: Conjunctivae normal.  Neck:     Thyroid: No thyromegaly.  Cardiovascular:     Rate and Rhythm: Normal rate and regular rhythm.     Heart sounds: No murmur heard.    No friction rub. No gallop.  Pulmonary:      Breath sounds: No stridor. No wheezing or rales.  Chest:     Chest wall: No tenderness.  Abdominal:     General: There is no distension.     Tenderness: There is no abdominal tenderness. There is no rebound.  Musculoskeletal:        General: Normal range of motion.     Cervical back: Neck supple.     Comments: Tender left flank  Lymphadenopathy:     Cervical: No cervical adenopathy.  Skin:    Findings: No erythema or rash.  Neurological:     Mental Status: She is alert and oriented to person, place, and time.     Motor: No abnormal muscle tone.     Coordination: Coordination normal.  Psychiatric:        Behavior: Behavior normal.     ED Results / Procedures / Treatments   Labs (all labs ordered are listed, but only abnormal results are displayed) Labs Reviewed  CBC WITH DIFFERENTIAL/PLATELET - Abnormal; Notable for the following components:      Result Value   Hemoglobin 11.8 (*)    All other components within normal limits  COMPREHENSIVE METABOLIC PANEL - Abnormal; Notable for the following components:   Glucose, Bld 100 (*)    AST 14 (*)    All other components within normal limits  URINALYSIS, ROUTINE W REFLEX MICROSCOPIC - Abnormal; Notable for the following components:   Color, Urine STRAW (*)    All other components within normal limits  TROPONIN I (HIGH SENSITIVITY)  TROPONIN I (HIGH SENSITIVITY)    EKG None  Radiology CT Renal Stone Study  Result Date: 11/27/2021 CLINICAL DATA:  Flank pain, back pain EXAM: CT ABDOMEN AND PELVIS WITHOUT CONTRAST TECHNIQUE: Multidetector CT imaging of the abdomen and pelvis was performed following the standard protocol without IV contrast. RADIATION DOSE REDUCTION: This exam was performed according to the departmental dose-optimization program which includes automated exposure control, adjustment of the mA and/or kV according to patient size and/or use of iterative reconstruction technique. COMPARISON:  None Available. FINDINGS:  Lower chest: Dense calcification is seen in the mitral annulus. Hepatobiliary: No focal abnormalities are seen liver. There is no dilation of bile ducts. Gallbladder is slightly distended. There is no significant wall thickening in gallbladder. Pancreas: There is atrophy.  No focal abnormalities are seen. Spleen: Unremarkable. Adrenals/Urinary Tract: There is 2.4 cm smoothly marginated nodule in right adrenal. Density measurements range up to 21 Hounsfield units. There is no hydronephrosis. There are no renal or ureteral stones. Urinary bladder is unremarkable. Stomach/Bowel: Stomach is unremarkable. Small bowel loops are not dilated. Appendix is not dilated. There is no significant wall thickening in colon. Scattered diverticula are  seen in colon without signs of focal acute diverticulitis. Vascular/Lymphatic: Scattered arterial calcifications are seen. No significant lymphadenopathy is seen. Reproductive: There is 3.5 cm small marginated fluid density lesion in the left adnexa. Density measurements are less than 20 Hounsfield units. There are no definite internal septations or mural nodules. In view of patient's age, follow-up pelvic sonogram should be considered. Other: There is no ascites or pneumoperitoneum. Musculoskeletal: Degenerative changes are noted in lumbar spine, lower thoracic spine and left hip. 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 IMPRESSION: There is no evidence of intestinal obstruction or pneumoperitoneum. There is no hydronephrosis. Appendix is not dilated. There is 2.4 cm smooth marginated nodule in right adrenal. This may suggest adenoma or some other neoplastic process. Multiphasic CT or MRI may be considered for further characterization. There is 3.5 cm smooth marginated low-density lesion in the left adnexa. Follow-up pelvic sonogram should be considered. Other findings as described in the body of the report. Other findings as described in the body of the report. Diverticulosis of  colon without signs of focal diverticulitis. Electronically Signed   By: Elmer Picker M.D.   On: 11/27/2021 13:31   DG Chest Port 1 View  Result Date: 11/27/2021 CLINICAL DATA:  Chest pain EXAM: PORTABLE CHEST 1 VIEW COMPARISON:  07/18/2016 chest radiograph. FINDINGS: Stable cardiomediastinal silhouette with mild cardiomegaly. No pneumothorax. No pleural effusion. Cephalization of the pulmonary vasculature without overt pulmonary edema. No acute consolidative airspace disease. IMPRESSION: Mild cardiomegaly without overt pulmonary edema. No active pulmonary disease. Electronically Signed   By: Ilona Sorrel M.D.   On: 11/27/2021 13:01    Procedures Procedures    Medications Ordered in ED Medications  oxyCODONE-acetaminophen (PERCOCET/ROXICET) 5-325 MG per tablet 1 tablet (has no administration in time range)    ED Course/ Medical Decision Making/ A&P                           Medical Decision Making Amount and/or Complexity of Data Reviewed Labs: ordered. Radiology: ordered.  Risk Prescription drug management.  This patient presents to the ED for concern of flank pain, this involves an extensive number of treatment options, and is a complaint that carries with it a high risk of complications and morbidity.  The differential diagnosis includes UTI, kidney stone, musculoskeletal pain   Co morbidities that complicate the patient evaluation  GERD   Additional history obtained:  Additional history obtained from patient External records from outside source obtained and reviewed including hospital records   Lab Tests:  I Ordered, and personally interpreted labs.  The pertinent results include: CBC shows mild anemia 11.8   Imaging Studies ordered:  I ordered imaging studies including CT kidney stone I independently visualized and interpreted imaging which showed questionable right adrenal mass and left adnexal mass I agree with the radiologist interpretation   Cardiac  Monitoring: / EKG:  The patient was maintained on a cardiac monitor.  I personally viewed and interpreted the cardiac monitored which showed an underlying rhythm of: Normal sinus rhythm   Consultations Obtained:  No consult  Problem List / ED Course / Critical interventions / Medication management  Hypertension and flank pain I ordered medication including Percocet for pain Reevaluation of the patient after these medicines showed that the patient improved I have reviewed the patients home medicines and have made adjustments as needed   Social Determinants of Health:  None   Test / Admission - Considered:  None  Patient with  left flank pain with negative CT CAT scan for kidney stone.  She has questionable right adrenal mass and left adnexal mass.  She will follow-up with an outpatient for that.  She will continue her hydrocodone for pain.  She has been referred to cardiology for the chest pain she has had this week.        Final Clinical Impression(s) / ED Diagnoses Final diagnoses:  Flank pain  Atypical chest pain    Rx / DC Orders ED Discharge Orders     None         Milton Ferguson, MD 11/27/21 1746

## 2021-11-30 ENCOUNTER — Encounter: Payer: Self-pay | Admitting: Family Medicine

## 2021-11-30 ENCOUNTER — Ambulatory Visit (INDEPENDENT_AMBULATORY_CARE_PROVIDER_SITE_OTHER): Payer: Medicare Other | Admitting: Family Medicine

## 2021-11-30 VITALS — BP 146/78 | HR 67 | Temp 97.2°F | Ht 61.0 in | Wt 206.8 lb

## 2021-11-30 DIAGNOSIS — R935 Abnormal findings on diagnostic imaging of other abdominal regions, including retroperitoneum: Secondary | ICD-10-CM | POA: Diagnosis not present

## 2021-11-30 DIAGNOSIS — N9489 Other specified conditions associated with female genital organs and menstrual cycle: Secondary | ICD-10-CM | POA: Diagnosis not present

## 2021-11-30 DIAGNOSIS — E278 Other specified disorders of adrenal gland: Secondary | ICD-10-CM

## 2021-11-30 NOTE — Progress Notes (Signed)
Subjective: CC: ER follow-up for back pain PCP: Loman Brooklyn, FNP OEV:OJJK C Cottrell is a 81 y.o. female presenting to clinic today for:  1.  ER follow-up for back pain Patient was seen in the ER for both atypical chest pain and back pain.  She had a CT renal stone study performed.  She notes that this was negative but no other discussion was held with regards to the remainder of the CAT scan.  She has ongoing left low back pain but notes that that is getting slightly better since hospital eval.  No dysuria, hematuria, nausea, vomiting, fevers.  Has not yet seen the cardiologist but this is scheduled next week.  She notes resolution of the atypical chest pain.   ROS: Per HPI  Allergies  Allergen Reactions   Iodinated Contrast Media Anaphylaxis   Diphenhydramine Other (See Comments)    Patient states "feels like somebody is inside of me clawing me out"   Codeine Other (See Comments)    Hallucinations   Statins Other (See Comments)    Joint pains and weakness. Joint pains and weakness. Joint pains and weakness.   Zetia [Ezetimibe] Other (See Comments)    Aching in arms and legs.   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   Osteoporosis    Vitamin B12 deficiency 12/06/2017    Current Outpatient Medications:    albuterol (VENTOLIN HFA) 108 (90 Base) MCG/ACT inhaler, Inhale 2 puffs into the lungs every 6 (six) hours as needed for wheezing or shortness of breath., Disp: 18 g, Rfl: 0   alendronate (FOSAMAX) 70 MG tablet, Take 1 tablet (70 mg total) by mouth every 7 (seven) days. Take with a full glass of water on an empty stomach., Disp: 12 tablet, Rfl: 1   amLODipine (NORVASC) 10 MG tablet, Take 1 tablet (10 mg total) by mouth daily., Disp: 90 tablet, Rfl: 1   CALCIUM PO, Take by mouth daily., Disp: , Rfl:    celecoxib (CELEBREX) 200 MG capsule, 1 tablet daily, Disp: 90 capsule, Rfl: 1   esomeprazole (NEXIUM) 40 MG  capsule, 1 tablet daily, Disp: 90 capsule, Rfl: 1   furosemide (LASIX) 20 MG tablet, Take 2 tablets (40 mg total) by mouth daily., Disp: 180 tablet, Rfl: 1   [START ON 12/26/2021] HYDROcodone-acetaminophen (NORCO) 5-325 MG tablet, Take 1 tablet by mouth every 8 (eight) hours as needed for moderate pain., Disp: 90 tablet, Rfl: 0   levocetirizine (XYZAL) 5 MG tablet, Take 1 tablet (5 mg total) by mouth every evening., Disp: 90 tablet, Rfl: 1   lisinopril (ZESTRIL) 40 MG tablet, Take 1 tablet (40 mg total) by mouth daily., Disp: 90 tablet, Rfl: 1   potassium chloride (KLOR-CON) 10 MEQ tablet, Take 1 tablet (10 mEq total) by mouth 2 (two) times daily., Disp: 180 tablet, Rfl: 1   VITAMIN D PO, Take by mouth daily., Disp: , Rfl:    HYDROcodone-acetaminophen (NORCO) 5-325 MG tablet, Take 1 tablet by mouth every 8 (eight) hours as needed for moderate pain. (Patient not taking: Reported on 11/30/2021), Disp: 90 tablet, Rfl: 0   HYDROcodone-acetaminophen (NORCO) 5-325 MG tablet, Take 1 tablet by mouth every 8 (eight) hours as needed for moderate pain. (Patient not taking: Reported on 11/30/2021), Disp: 90 tablet, Rfl: 0 Social History   Socioeconomic History   Marital status: Divorced    Spouse name: Not on file   Number of children: 7  Years of education: Not on file   Highest education level: 8th grade  Occupational History   Occupation: Retired  Tobacco Use   Smoking status: Never   Smokeless tobacco: Never  Vaping Use   Vaping Use: Never used  Substance and Sexual Activity   Alcohol use: No   Drug use: No   Sexual activity: Not Currently  Other Topics Concern   Not on file  Social History Narrative   Lives alone - all of her children live nearby   Social Determinants of Health   Financial Resource Strain: High Risk (12/21/2020)   Overall Financial Resource Strain (CARDIA)    Difficulty of Paying Living Expenses: Hard  Food Insecurity: No Food Insecurity (12/21/2020)   Hunger Vital Sign     Worried About Running Out of Food in the Last Year: Never true    [place] in the Last Year: Never true  Transportation Needs: No Transportation Needs (12/21/2020)   PRAPARE - Hydrologist (Medical): No    Lack of Transportation (Non-Medical): No  Physical Activity: Sufficiently Active (12/21/2020)   Exercise Vital Sign    Days of Exercise per Week: 5 days    Minutes of Exercise per Session: 30 min  Stress: No Stress Concern Present (12/21/2020)   [place]    Feeling of Stress : Only a little  Social Connections: Moderately Integrated (12/21/2020)   Social Connection and Isolation Panel [NHANES]    Frequency of Communication with Friends and Family: More than three times a week    Frequency of Social Gatherings with Friends and Family: More than three times a week    Attends Religious Services: More than 4 times per year    Active Member of [employer] or Organizations: Yes    Attends Archivist Meetings: More than 4 times per year    Marital Status: Separated  Intimate Partner Violence: Not At Risk (12/21/2020)   Humiliation, Afraid, Rape, and Kick questionnaire    Fear of Current or Ex-Partner: No    Emotionally Abused: No    Physically Abused: No    Sexually Abused: No   Family History  Problem Relation Age of Onset   Breast cancer Mother     Objective: Office vital signs reviewed. BP (!) 151/79   Pulse 67   Temp (!) 97.2 F (36.2 C)   Ht '5\' 1"'$  (1.549 m)   Wt 206 lb 12.8 oz (93.8 kg)   SpO2 96%   BMI 39.07 kg/m   Physical Examination:  General: Awake, alert, morbidly obese female, No acute distress HEENT: Sclera white. MSK: Ambulating independently  CT Renal Stone Study  Result Date: 11/27/2021 CLINICAL DATA:  Flank pain, back pain EXAM: CT ABDOMEN AND PELVIS WITHOUT CONTRAST TECHNIQUE: Multidetector CT imaging of the abdomen and pelvis was performed following the  standard protocol without IV contrast. RADIATION DOSE REDUCTION: This exam was performed according to the departmental dose-optimization program which includes automated exposure control, adjustment of the mA and/or kV according to patient size and/or use of iterative reconstruction technique. COMPARISON:  None Available. FINDINGS: Lower chest: Dense calcification is seen in the mitral annulus. Hepatobiliary: No focal abnormalities are seen liver. There is no dilation of bile ducts. Gallbladder is slightly distended. There is no significant wall thickening in gallbladder. Pancreas: There is atrophy.  No focal abnormalities are seen. Spleen: Unremarkable. Adrenals/Urinary Tract: There is 2.4 cm smoothly marginated nodule in  right adrenal. Density measurements range up to 21 Hounsfield units. There is no hydronephrosis. There are no renal or ureteral stones. Urinary bladder is unremarkable. Stomach/Bowel: Stomach is unremarkable. Small bowel loops are not dilated. Appendix is not dilated. There is no significant wall thickening in colon. Scattered diverticula are seen in colon without signs of focal acute diverticulitis. Vascular/Lymphatic: Scattered arterial calcifications are seen. No significant lymphadenopathy is seen. Reproductive: There is 3.5 cm small marginated fluid density lesion in the left adnexa. Density measurements are less than 20 Hounsfield units. There are no definite internal septations or mural nodules. In view of patient's age, follow-up pelvic sonogram should be considered. Other: There is no ascites or pneumoperitoneum. Musculoskeletal: Degenerative changes are noted in lumbar spine, lower thoracic spine and left hip. 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 IMPRESSION: There is no evidence of intestinal obstruction or pneumoperitoneum. There is no hydronephrosis. Appendix is not dilated. There is 2.4 cm smooth marginated nodule in right adrenal. This may suggest adenoma or some other  neoplastic process. Multiphasic CT or MRI may be considered for further characterization. There is 3.5 cm smooth marginated low-density lesion in the left adnexa. Follow-up pelvic sonogram should be considered. Other findings as described in the body of the report. Other findings as described in the body of the report. Diverticulosis of colon without signs of focal diverticulitis. Electronically Signed   By: Elmer Picker M.D.   On: 11/27/2021 13:31   DG Chest Port 1 View  Result Date: 11/27/2021 CLINICAL DATA:  Chest pain EXAM: PORTABLE CHEST 1 VIEW COMPARISON:  07/18/2016 chest radiograph. FINDINGS: Stable cardiomediastinal silhouette with mild cardiomegaly. No pneumothorax. No pleural effusion. Cephalization of the pulmonary vasculature without overt pulmonary edema. No acute consolidative airspace disease. IMPRESSION: Mild cardiomegaly without overt pulmonary edema. No active pulmonary disease. Electronically Signed   By: Ilona Sorrel M.D.   On: 11/27/2021 13:01    Assessment/ Plan: 81 y.o. female   Abnormal CT of the abdomen  Adnexal mass - Plan: Ambulatory referral to Obstetrics / Gynecology, MR ABDOMEN WO CONTRAST  Adrenal mass ([place]) - Plan: MR ABDOMEN WO CONTRAST  Discussed CT results with Dr on call at [facility] radiology.  Will order MR abdomen without contrast adrenal protocol to follow up this possible adrenal mass.  I discussed patient's contrast allergy with the radiologist and he felt that this would give [place] the same quality of results as a CT with contrast.   Referral urgently placed to OB/GYN for further evaluation of adnexal mass.  Suspect that they will obtain onsite ultrasound.  I discussed these findings with the patient in detail today as she had not been informed of them and her ER visit.  Further work-up pending these results.  Will defer to PCP.  No orders of the defined types were placed in this encounter.  No orders of the defined types were placed in this  encounter.  Janora Norlander, DO [place] [phone]

## 2021-12-06 ENCOUNTER — Ambulatory Visit: Payer: Medicare Other | Admitting: Physician Assistant

## 2021-12-06 ENCOUNTER — Telehealth: Payer: Self-pay | Admitting: Family Medicine

## 2021-12-06 NOTE — Telephone Encounter (Signed)
Pt called stating that she needs her MRI appt at [facility] to be rescheduled. Please call patient with new date and time. Says it needs to be at least 2 wks out.

## 2021-12-14 NOTE — Telephone Encounter (Signed)
Lmtcb.

## 2021-12-15 NOTE — Progress Notes (Signed)
Office Visit    Patient Name: Melissa Zavala Date of Encounter: 12/19/2021  PCP:  Melissa Zavala, Melissa Zavala  Cardiologist:  Melissa Dolly, MD  Advanced Practice Provider:  No care team member to display Electrophysiologist:  None   HPI    Melissa Zavala is a 81 y.o. female with a past medical history significant for lower extremity edema, arthritis, chronic pain, GERD, hypertension presents today for follow-up.  She was last seen in the office 2/21 by Dr. Harl Zavala.  She had had several appointments that were canceled since then.  At that time, she was having some dyspnea on exertion and lower extremity edema.  She was on Lasix as needed which was controlling her swelling.  She was recently in the ED 11/27/2021 and was having chest pain and left flank pain.  She had a CT scan to evaluate her kidneys which showed a questionable right adrenal mass and left adnexal mass.  She also had some hypertension while in the hospital.  Troponin was 10.  It does not appear like it was trended.  No significant EKG findings.  Referred back to cardiology for work-up of chest pain.  Today, she shares that she is having some lower extremity edema.  She was recently in the hospital because she was feeling weak and she started to have chest and flank pain.  She states her chest pain came on after eating a banana and after she took a heartburn pill it got better.  She had some imaging done in the hospital which we reviewed.  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  I will leave this up to her primary to workup.  We have discussed increasing her Lasix for fluid overload and wearing compression hose/socks.  Luckily, no further chest pains.  Reports no shortness of breath nor dyspnea on exertion. Reports no chest pain, pressure, or tightness. No orthopnea, PND. Reports no palpitations.    Past Medical History    Past Medical History:  Diagnosis Date   Arthritis     Hypertension    Mixed hyperlipidemia 06/14/2020   Osteoporosis    Vitamin B12 deficiency 12/06/2017   Past Surgical History:  Procedure Laterality Date   TOTAL HIP ARTHROPLASTY Right    TOTAL KNEE ARTHROPLASTY Bilateral    TUBAL LIGATION      Allergies  Allergies  Allergen Reactions   Iodinated Contrast Media Anaphylaxis   Diphenhydramine Other (See Comments)    Patient states "feels like somebody is inside of me clawing me out"   Codeine Other (See Comments)    Hallucinations   Statins Other (See Comments)    Joint pains and weakness. Joint pains and weakness. Joint pains and weakness.   Zetia [Ezetimibe] Other (See Comments)    Aching in arms and legs.    EKGs/Labs/Other Studies Reviewed:   The following studies were reviewed today:   EKG:  EKG is  ordered today.   Recent Labs: 10/27/2021: BNP 49.8; TSH 0.332 11/27/2021: ALT 12; BUN 12; Creatinine, Ser 0.62; Hemoglobin 11.8; Platelets 252; Potassium 3.7; Sodium 139  Recent Lipid Panel    Component Value Date/Time   CHOL 209 (H) 10/27/2021 1630   TRIG 102 10/27/2021 1630   HDL 88 10/27/2021 1630   CHOLHDL 2.4 10/27/2021 1630   LDLCALC 103 (H) 10/27/2021 1630   Home Medications   Current Meds  Medication Sig   albuterol (VENTOLIN HFA) 108 (90 Base) MCG/ACT  inhaler Inhale 2 puffs into the lungs every 6 (six) hours as needed for wheezing or shortness of breath.   alendronate (FOSAMAX) 70 MG tablet Take 1 tablet (70 mg total) by mouth every 7 (seven) days. Take with a full glass of water on an empty stomach.   CALCIUM PO Take by mouth daily.   celecoxib (CELEBREX) 200 MG capsule 1 tablet daily   esomeprazole (NEXIUM) 40 MG capsule 1 tablet daily   furosemide (LASIX) 20 MG tablet Take 2 tablets (40 mg total) by mouth daily. (Patient taking differently: Take 40 mg by mouth as needed.)   [START ON 12/26/2021] HYDROcodone-acetaminophen (NORCO) 5-325 MG tablet Take 1 tablet by mouth every 8 (eight) hours as needed for  moderate pain.   HYDROcodone-acetaminophen (NORCO) 5-325 MG tablet Take 1 tablet by mouth every 8 (eight) hours as needed for moderate pain.   HYDROcodone-acetaminophen (NORCO) 5-325 MG tablet Take 1 tablet by mouth every 8 (eight) hours as needed for moderate pain.   lisinopril (ZESTRIL) 40 MG tablet Take 1 tablet (40 mg total) by mouth daily.   potassium chloride (KLOR-CON) 10 MEQ tablet Take 1 tablet (10 mEq total) by mouth 2 (two) times daily.   VITAMIN D PO Take by mouth daily.   [DISCONTINUED] amLODipine (NORVASC) 10 MG tablet Take 1 tablet (10 mg total) by mouth daily.     Review of Systems      All other systems reviewed and are otherwise negative except as noted above.  Physical Exam    VS:  BP 138/60   Pulse 63   Ht '5\' 1"'$  (1.549 m)   Wt 206 lb 9.6 oz (93.7 kg)   SpO2 95%   BMI 39.04 kg/m  , BMI Body mass index is 39.04 kg/m.  Wt Readings from Last 3 Encounters:  12/19/21 206 lb 9.6 oz (93.7 kg)  11/30/21 206 lb 12.8 oz (93.8 kg)  11/27/21 205 lb (93 kg)     GEN: Well nourished, well developed, in no acute distress. HEENT: normal. Neck: Supple, no JVD, carotid bruits, or masses. Cardiac: RRR, 3/6 systolic murmur, rubs, or gallops. No clubbing, cyanosis, 1-2+ pitting edema.  Radials/PT 2+ and equal bilaterally.  Respiratory:  Respirations regular and unlabored, clear to auscultation bilaterally. GI: Soft, nontender, nondistended. MS: No deformity or atrophy. Skin: Warm and dry, no rash. Neuro:  Strength and sensation are intact. Psych: Normal affect.  Assessment & Plan    Chest pain This has resolved.  Thought to be due to acid reflux.  Patient had a banana and then took a heartburn pill and the pain got better. -Troponin flat in the hospital and no significant EKG changes -No further ischemic workup indicated at this time  SOB/DOE -Melissa Zavala is fluid overloaded on exam.  She has some pitting edema present in her lower extremity. -We have ordered Lasix 40 mg  daily x 7 days.  She has been taking this medication as needed.  We will order a BMP next week to review her kidney function.  She can take 20 mg as needed after. -Recommended lower extremity compression hose/socks.  We have provided her with information on the Lasix therapy in [place] -Continue a low-sodium diet -We have decreased her Norvasc from 10 mg down to 5 mg and encouraged her to take her blood pressure regularly  Heart murmur -We will order a follow-up echocardiogram today -Last echocardiogram 2021 showed mild mitral regurgitation, normal systolic function, and mild diastolic dysfunction (grade 1)  Hypertension -Amlodipine  $'10mg'v$  daily, and Lisinopril '40mg'$   -She has not taken any blood pressure medications today and her blood pressure is well-controlled.  We encouraged her to continue to monitor since we decreased her amlodipine to 5 mg daily  Hyperlipidemia -LDL 103 -Intolerant to any statin medications and also has an allergy to [place] -Discussed starting another medication if needed such as Repatha. -Her primary care looks after these numbers and has been making medication adjustments as indicated     Disposition: Follow up 3 months with Melissa Dolly, MD or APP.  Signed, Elgie Collard, PA-C 12/19/2021, 12:26 PM [facility]

## 2021-12-16 ENCOUNTER — Ambulatory Visit (HOSPITAL_COMMUNITY)
Admission: RE | Admit: 2021-12-16 | Discharge: 2021-12-16 | Disposition: A | Discharge status: Home / Self Care | Payer: Medicare Other | Source: Ambulatory Visit | Attending: Family Medicine | Admitting: Family Medicine

## 2021-12-16 DIAGNOSIS — E278 Other specified disorders of adrenal gland: Secondary | ICD-10-CM | POA: Diagnosis not present

## 2021-12-16 DIAGNOSIS — N9489 Other specified conditions associated with female genital organs and menstrual cycle: Secondary | ICD-10-CM

## 2021-12-16 DIAGNOSIS — D3501 Benign neoplasm of right adrenal gland: Secondary | ICD-10-CM | POA: Diagnosis not present

## 2021-12-19 ENCOUNTER — Ambulatory Visit: Payer: Self-pay | Admitting: *Deleted

## 2021-12-19 ENCOUNTER — Encounter: Payer: Self-pay | Admitting: Physician Assistant

## 2021-12-19 ENCOUNTER — Ambulatory Visit (INDEPENDENT_AMBULATORY_CARE_PROVIDER_SITE_OTHER): Payer: Medicare Other | Admitting: Physician Assistant

## 2021-12-19 VITALS — BP 138/60 | HR 63 | Ht 61.0 in | Wt 206.6 lb

## 2021-12-19 DIAGNOSIS — R0609 Other forms of dyspnea: Secondary | ICD-10-CM

## 2021-12-19 DIAGNOSIS — I10 Essential (primary) hypertension: Secondary | ICD-10-CM | POA: Diagnosis not present

## 2021-12-19 DIAGNOSIS — R079 Chest pain, unspecified: Secondary | ICD-10-CM | POA: Diagnosis not present

## 2021-12-19 DIAGNOSIS — E782 Mixed hyperlipidemia: Secondary | ICD-10-CM | POA: Diagnosis not present

## 2021-12-19 DIAGNOSIS — R609 Edema, unspecified: Secondary | ICD-10-CM

## 2021-12-19 DIAGNOSIS — I340 Nonrheumatic mitral (valve) insufficiency: Secondary | ICD-10-CM

## 2021-12-19 DIAGNOSIS — R011 Cardiac murmur, unspecified: Secondary | ICD-10-CM | POA: Diagnosis not present

## 2021-12-19 MED ORDER — AMLODIPINE BESYLATE 5 MG PO TABS: 5.0000 mg | ORAL_TABLET | Freq: Every day | ORAL | 3 refills | Status: DC

## 2021-12-19 MED ORDER — FUROSEMIDE 20 MG PO TABS: ORAL_TABLET | ORAL | 0 refills | Status: DC

## 2021-12-19 NOTE — Chronic Care Management (AMB) (Signed)
  Chronic Care Management   Note  12/19/2021 Name: Melissa Zavala MRN: [id number] DOB: [birth year]   Due to changes in the Chronic Care Management program, I am removing myself as the RN Care Manager from the Care Team and closing RN Care Management Care Plans. Patient was not scheduled to be followed by the RN Care Coordination nurse for [facility].   Patient does not have an open Care Plan with another CCM team member. Patient does not have a current CCM referral placed since 09/12/21. CCM enrollment status changed to "not enrolled".   Patient's PCP can place a new referral if the they needs Care Management or Care Coordination services in the future.  [name], BSN, RN-BC Proofreader Dial: [phone]

## 2021-12-19 NOTE — Patient Instructions (Signed)
Melissa Zavala  At some point during the past 4 years, I have worked with you through the [place] Management Program at [facility].  Due to program changes I am removing myself from your care team.   If you are currently active with another CCM Team Member, you will remain active with them unless they reach out to you with additional information.   If you feel that you need services in the future,  please talk with your primary care provider and request a new referral for Care Management or Care Coordination services. This does not affect your status as a patient at [facility].   Thank you for allowing me to participate in your your healthcare journey.  [name], BSN, RN-BC Proofreader Dial: [phone]

## 2021-12-19 NOTE — Patient Instructions (Signed)
Medication Instructions:  1.Take lasix 40 mg daily for one week, then 20 mg only as needed for lower extremity edema 2.Decrease norvasc to 5 mg daily *If you need a refill on your cardiac medications before your next appointment, please call your pharmacy*   Lab Work: BMP once you complete the one week of lasix 40 mg daily If you have labs (blood work) drawn today and your tests are completely normal, you will receive your results only by: [place] (if you have MyChart) OR A paper copy in the mail If you have any lab test that is abnormal or we need to change your treatment, we will call you to review the results.   Testing/Procedures: Your physician has requested that you have an echocardiogram. Echocardiography is a painless test that uses sound waves to create images of your heart. It provides your doctor with information about the size and shape of your heart and how well your heart's chambers and valves are working. This procedure takes approximately one hour. There are no restrictions for this procedure.    Follow-Up: At [facility], you and your health needs are our priority.  As part of our continuing mission to provide you with exceptional heart care, we have created designated Provider Care Teams.  These Care Teams include your primary Cardiologist (physician) and Advanced Practice Providers (APPs -  Physician Assistants and Nurse Practitioners) who all work together to provide you with the care you need, when you need it.   Your next appointment:   3 month(s)  The format for your next appointment:   In Person  Provider:   Carlyle Dolly, MD or Nicholes Rough, PA-C here in this office  Other Instructions Check your blood pressure daily one hour after taking your morning medications over the next 2 weeks and send [place] the readings via your MyChart  Important Information About [place]

## 2021-12-22 ENCOUNTER — Ambulatory Visit (INDEPENDENT_AMBULATORY_CARE_PROVIDER_SITE_OTHER): Payer: Medicare Other

## 2021-12-22 VITALS — Wt 206.0 lb

## 2021-12-22 DIAGNOSIS — Z9181 History of falling: Secondary | ICD-10-CM

## 2021-12-22 DIAGNOSIS — Z5941 Food insecurity: Secondary | ICD-10-CM

## 2021-12-22 DIAGNOSIS — Z0001 Encounter for general adult medical examination with abnormal findings: Secondary | ICD-10-CM

## 2021-12-22 DIAGNOSIS — Z0000 Encounter for general adult medical examination without abnormal findings: Secondary | ICD-10-CM

## 2021-12-22 NOTE — Patient Instructions (Signed)
Ms. Record , Thank you for taking time to come for your Medicare Wellness Visit. I appreciate your ongoing commitment to your health goals. Please review the following plan we discussed and let me know if I can assist you in the future.   Screening recommendations/referrals: Colonoscopy: Done 05/02/2016 - no repeat Mammogram: Done 08/15/2021 - Repeat annuallyD  Bone Density: one 03/02/2021 - Repeat every 2 years  Recommended yearly ophthalmology/optometry visit for glaucoma screening and checkup Recommended yearly dental visit for hygiene and checkup  Vaccinations: Influenza vaccine: Done 03/01/2021 - Repeat annually  Pneumococcal vaccine: Done 02/17/2019 & 03/09/2020 Tdap vaccine: Done 07/18/2016 - Repeat in 10 years  Shingles vaccine: Done 09/02/2019 - declines second dose - adverse reaction to first one Covid-19: Done 06/08/2019, 06/30/2019, 01/22/2020  Advanced directives: Please bring a copy of your health care power of attorney and living will to the office to be added to your chart at your convenience.   Conditions/risks identified: Aim for 4-6 glasses of water, plenty of protein in your diet and try to get up and walk/ stretch every hour for 5-10 minutes at a time.   Next appointment: Follow up in one year for your annual wellness visit    Preventive Care 65 Years and Older, Female Preventive care refers to lifestyle choices and visits with your health care provider that can promote health and wellness. What does preventive care include? A yearly physical exam. This is also called an annual well check. Dental exams once or twice a year. Routine eye exams. Ask your health care provider how often you should have your eyes checked. Personal lifestyle choices, including: Daily care of your teeth and gums. Regular physical activity. Eating a healthy diet. Avoiding tobacco and drug use. Limiting alcohol use. Practicing safe sex. Taking low-dose aspirin every day. Taking vitamin and  mineral supplements as recommended by your health care provider. What happens during an annual well check? The services and screenings done by your health care provider during your annual well check will depend on your age, overall health, lifestyle risk factors, and family history of disease. Counseling  Your health care provider may ask you questions about your: Alcohol use. Tobacco use. Drug use. Emotional well-being. Home and relationship well-being. Sexual activity. Eating habits. History of falls. Memory and ability to understand (cognition). Work and work Statistician. Reproductive health. Screening  You may have the following tests or measurements: Height, weight, and BMI. Blood pressure. Lipid and cholesterol levels. These may be checked every 5 years, or more frequently if you are over 5 years old. Skin check. Lung cancer screening. You may have this screening every year starting at age 24 if you have a 30-pack-year history of smoking and currently smoke or have quit within the past 15 years. Fecal occult blood test (FOBT) of the stool. You may have this test every year starting at age 50. Flexible sigmoidoscopy or colonoscopy. You may have a sigmoidoscopy every 5 years or a colonoscopy every 10 years starting at age 77. Hepatitis C blood test. Hepatitis B blood test. Sexually transmitted disease (STD) testing. Diabetes screening. This is done by checking your blood sugar (glucose) after you have not eaten for a while (fasting). You may have this done every 1-3 years. Bone density scan. This is done to screen for osteoporosis. You may have this done starting at age 64. Mammogram. This may be done every 1-2 years. Talk to your health care provider about how often you should have regular mammograms. Talk with  your health care provider about your test results, treatment options, and if necessary, the need for more tests. Vaccines  Your health care provider may recommend  certain vaccines, such as: Influenza vaccine. This is recommended every year. Tetanus, diphtheria, and acellular pertussis (Tdap, Td) vaccine. You may need a Td booster every 10 years. Zoster vaccine. You may need this after age 5. Pneumococcal 13-valent conjugate (PCV13) vaccine. One dose is recommended after age 52. Pneumococcal polysaccharide (PPSV23) vaccine. One dose is recommended after age 61. Talk to your health care provider about which screenings and vaccines you need and how often you need them. This information is not intended to replace advice given to you by your health care provider. Make sure you discuss any questions you have with your health care provider. Document Released: 05/28/2015 Document Revised: 01/19/2016 Document Reviewed: 03/02/2015 Elsevier Interactive Patient Education  2017 [place] Prevention in the Home Falls can cause injuries. They can happen to people of all ages. There are many things you can do to make your home safe and to help prevent falls. What can I do on the outside of my home? Regularly fix the edges of walkways and driveways and fix any cracks. Remove anything that might make you trip as you walk through a door, such as a raised step or threshold. Trim any bushes or trees on the path to your home. Use bright outdoor lighting. Clear any walking paths of anything that might make someone trip, such as rocks or tools. Regularly check to see if handrails are loose or broken. Make sure that both sides of any steps have handrails. Any raised decks and porches should have guardrails on the edges. Have any leaves, snow, or ice cleared regularly. Use sand or salt on walking paths during winter. Clean up any spills in your garage right away. This includes oil or grease spills. What can I do in the bathroom? Use night lights. Install grab bars by the toilet and in the tub and shower. Do not use towel bars as grab bars. Use non-skid mats or  decals in the tub or shower. If you need to sit down in the shower, use a plastic, non-slip stool. Keep the floor dry. Clean up any water that spills on the floor as soon as it happens. Remove soap buildup in the tub or shower regularly. Attach bath mats securely with double-sided non-slip rug tape. Do not have throw rugs and other things on the floor that can make you trip. What can I do in the bedroom? Use night lights. Make sure that you have a light by your bed that is easy to reach. Do not use any sheets or blankets that are too big for your bed. They should not hang down onto the floor. Have a firm chair that has side arms. You can use this for support while you get dressed. Do not have throw rugs and other things on the floor that can make you trip. What can I do in the kitchen? Clean up any spills right away. Avoid walking on wet floors. Keep items that you use a lot in easy-to-reach places. If you need to reach something above you, use a strong step stool that has a grab bar. Keep electrical cords out of the way. Do not use floor polish or wax that makes floors slippery. If you must use wax, use non-skid floor wax. Do not have throw rugs and other things on the floor that can make you trip.  What can I do with my stairs? Do not leave any items on the stairs. Make sure that there are handrails on both sides of the stairs and use them. Fix handrails that are broken or loose. Make sure that handrails are as long as the stairways. Check any carpeting to make sure that it is firmly attached to the stairs. Fix any carpet that is loose or worn. Avoid having throw rugs at the top or bottom of the stairs. If you do have throw rugs, attach them to the floor with carpet tape. Make sure that you have a light switch at the top of the stairs and the bottom of the stairs. If you do not have them, ask someone to add them for you. What else can I do to help prevent falls? Wear shoes that: Do not  have high heels. Have rubber bottoms. Are comfortable and fit you well. Are closed at the toe. Do not wear sandals. If you use a stepladder: Make sure that it is fully opened. Do not climb a closed stepladder. Make sure that both sides of the stepladder are locked into place. Ask someone to hold it for you, if possible. Clearly mark and make sure that you can see: Any grab bars or handrails. First and last steps. Where the edge of each step is. Use tools that help you move around (mobility aids) if they are needed. These include: Canes. Walkers. Scooters. Crutches. Turn on the lights when you go into a dark area. Replace any light bulbs as soon as they burn out. Set up your furniture so you have a clear path. Avoid moving your furniture around. If any of your floors are uneven, fix them. If there are any pets around you, be aware of where they are. Review your medicines with your doctor. Some medicines can make you feel dizzy. This can increase your chance of falling. Ask your doctor what other things that you can do to help prevent falls. This information is not intended to replace advice given to you by your health care provider. Make sure you discuss any questions you have with your health care provider. Document Released: 02/25/2009 Document Revised: 10/07/2015 Document Reviewed: 06/05/2014 Elsevier Interactive Patient Education  2017 [employer].

## 2021-12-22 NOTE — Progress Notes (Signed)
Subjective:   Melissa Zavala is a 81 y.o. female who presents for Medicare Annual (Subsequent) preventive examination.  Virtual Visit via Telephone Note  I connected with  Melissa Zavala on 12/22/21 at  2:45 PM EDT by telephone and verified that I am speaking with the correct person using two identifiers.  Location: Patient: Home Provider: [facility] Persons participating in the virtual visit: patient/Nurse Health Advisor   I discussed the limitations, risks, security and privacy concerns of performing an evaluation and management service by telephone and the availability of in person appointments. The patient expressed understanding and agreed to proceed.  Interactive audio and video telecommunications were attempted between this nurse and patient, however failed, due to patient having technical difficulties OR patient did not have access to video capability.  We continued and completed visit with audio only.  Some vital signs may be absent or patient reported.   Amalio Loe E Quinn Quam, LPN   Review of Systems     Cardiac Risk Factors include: advanced age (>46mn, >>80women);obesity (BMI >30kg/m2);sedentary lifestyle;hypertension;dyslipidemia     Objective:    Today's Vitals   12/22/21 1448  Weight: 206 lb (93.4 kg)   Body mass index is 38.92 kg/m.     12/22/2021    2:58 PM 11/27/2021   12:06 PM 12/21/2020    4:56 PM 03/18/2020    1:46 PM 11/19/2018    1:41 PM 01/28/2018   11:11 AM 01/18/2018    2:48 PM  Advanced Directives  Does Patient Have a Medical Advance Directive? Yes No No No No No No  [place] of [place]        Copy of [place]? No - copy requested        Would patient like information on creating a medical advance directive?  No - Guardian declined No - Patient declined  Yes (MAU/Ambulatory/Procedural Areas - Information given)      Current Medications (verified) Outpatient Encounter Medications as of  12/22/2021  Medication Sig   albuterol (VENTOLIN HFA) 108 (90 Base) MCG/ACT inhaler Inhale 2 puffs into the lungs every 6 (six) hours as needed for wheezing or shortness of breath.   alendronate (FOSAMAX) 70 MG tablet Take 1 tablet (70 mg total) by mouth every 7 (seven) days. Take with a full glass of water on an empty stomach.   amLODipine (NORVASC) 5 MG tablet Take 1 tablet (5 mg total) by mouth daily.   CALCIUM PO Take by mouth daily.   celecoxib (CELEBREX) 200 MG capsule 1 tablet daily   esomeprazole (NEXIUM) 40 MG capsule 1 tablet daily   furosemide (LASIX) 20 MG tablet Take 40 mg by mouth daily for 1 week, then 20 mg by mouth as needed for lower extremity swelling   [START ON 12/26/2021] HYDROcodone-acetaminophen (NORCO) 5-325 MG tablet Take 1 tablet by mouth every 8 (eight) hours as needed for moderate pain.   HYDROcodone-acetaminophen (NORCO) 5-325 MG tablet Take 1 tablet by mouth every 8 (eight) hours as needed for moderate pain.   HYDROcodone-acetaminophen (NORCO) 5-325 MG tablet Take 1 tablet by mouth every 8 (eight) hours as needed for moderate pain.   levocetirizine (XYZAL) 5 MG tablet Take 1 tablet (5 mg total) by mouth every evening.   lisinopril (ZESTRIL) 40 MG tablet Take 1 tablet (40 mg total) by mouth daily.   potassium chloride (KLOR-CON) 10 MEQ tablet Take 1 tablet (10 mEq total) by mouth 2 (two) times daily.  VITAMIN D PO Take by mouth daily.   No facility-administered encounter medications on file as of 12/22/2021.    Allergies (verified) Iodinated contrast media, Diphenhydramine, Codeine, Statins, and Zetia [ezetimibe]   History: 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   Osteoporosis    Vitamin B12 deficiency 12/06/2017   Past Surgical History:  Procedure Laterality Date   TOTAL HIP ARTHROPLASTY Right    TOTAL KNEE ARTHROPLASTY Bilateral    TUBAL LIGATION     Family History  Problem Relation Age  of Onset   Breast cancer Mother    Social History   Socioeconomic History   Marital status: Divorced    Spouse name: Not on file   Number of children: 7   Years of education: Not on file   Highest education level: 8th grade  Occupational History   Occupation: Retired  Tobacco Use   Smoking status: Never   Smokeless tobacco: Never  Vaping Use   Vaping Use: Never used  Substance and Sexual Activity   Alcohol use: No   Drug use: No   Sexual activity: Not Currently  Other Topics Concern   Not on file  Social History Narrative   Lives alone - all of her children live nearby   Social Determinants of Health   Financial Resource Strain: Medium Risk (12/22/2021)   Overall Financial Resource Strain (CARDIA)    Difficulty of Paying Living Expenses: Somewhat hard  Food Insecurity: Food Insecurity Present (12/22/2021)   Hunger Vital Sign    Worried About Running Out of Food in the Last Year: Sometimes true    Ran Out of Food in the Last Year: Sometimes true  Transportation Needs: No Transportation Needs (12/22/2021)   PRAPARE - Hydrologist (Medical): No    Lack of Transportation (Non-Medical): No  Physical Activity: Insufficiently Active (12/22/2021)   Exercise Vital Sign    Days of Exercise per Week: 7 days    Minutes of Exercise per Session: 10 min  Stress: No Stress Concern Present (12/22/2021)   [place]    Feeling of Stress : Not at all  Social Connections: Moderately Integrated (12/22/2021)   Social Connection and Isolation Panel [NHANES]    Frequency of Communication with Friends and Family: More than three times a week    Frequency of Social Gatherings with Friends and Family: More than three times a week    Attends Religious Services: More than 4 times per year    Active Member of [employer] or Organizations: Yes    Attends Music therapist: More than 4 times per year     Marital Status: Separated    Tobacco Counseling Counseling given: Not Answered   Clinical Intake:  Pre-visit preparation completed: Yes  Pain : No/denies pain     BMI - recorded: 38.92 Nutritional Status: BMI > 30  Obese Nutritional Risks: None Diabetes: No  How often do you need to have someone help you when you read instructions, pamphlets, or other written materials from your doctor or pharmacy?: 1 - Never  Diabetic? no  Interpreter Needed?: No  Information entered by :: Blade Scheff, LPN   Activities of Daily Living    12/22/2021    2:54 PM  In your present state of health, do you have any difficulty performing the following activities:  Hearing? 0  Vision? 0  Difficulty concentrating  or making decisions? 0  Walking or climbing stairs? 0  Dressing or bathing? 0  Doing errands, shopping? 0  Preparing Food and eating ? N  Using the Toilet? N  In the past six months, have you accidently leaked urine? Y  Do you have problems with loss of bowel control? N  Managing your Medications? N  Managing your Finances? N  Housekeeping or managing your Housekeeping? N    Patient Care Team: Loman Brooklyn, FNP as PCP - General (Family Medicine) Harl Bowie Alphonse Guild, MD as PCP - Cardiology (Cardiology) Vela Prose, MD as Surgeon (Student)  Indicate any recent Medical Services you may have received from other than [facility] providers in the past year (date may be approximate).     Assessment:   This is a routine wellness examination for Melissa Zavala.  Hearing/Vision screen Hearing Screening - Comments:: Denies hearing difficulties   Vision Screening - Comments:: Wears rx glasses - up to date with routine eye exams with [facility]  Dietary issues and exercise activities discussed: Current Exercise Habits: Home exercise routine, Type of exercise: walking;stretching, Time (Minutes): 10, Frequency (Times/Week): 7, Weekly Exercise (Minutes/Week): 70, Intensity: Mild,  Exercise limited by: orthopedic condition(s)   Goals Addressed             This Visit's Progress    Patient Stated       Hopes to stay healthy and independent       Depression Screen    12/22/2021    3:03 PM 10/27/2021    3:39 PM 07/27/2021   11:47 AM 04/28/2021   10:02 AM 03/01/2021   11:06 AM 12/21/2020    4:40 PM 12/10/2020    3:58 PM  PHQ 2/9 Scores  PHQ - 2 Score 0 0 0 0 0 0 0  PHQ- 9 Score 0 0 0 0 0 0 0    Fall Risk    12/22/2021    2:50 PM 10/27/2021    3:39 PM 07/27/2021   11:47 AM 04/28/2021   10:02 AM 03/01/2021   11:06 AM  [place] in the past year? 0 0 0 0 0  Number falls in past yr: 0      Injury with Fall? 0      Risk for fall due to : Impaired balance/gait;Orthopedic patient      Follow up Falls prevention discussed;Education provided        FALL RISK PREVENTION PERTAINING TO THE HOME:  Any stairs in or around the home? Yes  If so, are there any without handrails? No  Home free of loose throw rugs in walkways, pet beds, electrical cords, etc? Yes  Adequate lighting in your home to reduce risk of falls? Yes   ASSISTIVE DEVICES UTILIZED TO PREVENT FALLS:  Life alert? No  Use of a cane, walker or w/c? Yes  Grab bars in the bathroom? No  Shower chair or bench in shower? No  Elevated toilet seat or a handicapped toilet? No   TIMED UP AND GO:  Was the test performed? No . Telephonic visit  Cognitive Function:        12/22/2021    2:58 PM 12/21/2020    4:47 PM 11/19/2018    1:42 PM  6CIT Screen  What Year? 0 points 0 points 0 points  What month? 0 points 0 points 0 points  What time? 0 points 0 points 0 points  Count back from 20 0 points 0 points 0 points  Months  in reverse 0 points 0 points 0 points  Repeat phrase 0 points 4 points 2 points  Total Score 0 points 4 points 2 points    Immunizations Immunization History  Administered Date(s) Administered   Fluad Quad(high Dose 65+) 01/16/2019, 03/04/2020, 03/01/2021   Influenza  Inj Mdck Quad Pf 01/16/2019   Influenza Split 02/10/2009, 03/03/2010, 04/27/2011, 02/26/2012, 05/21/2013, 04/28/2020   Influenza, High Dose Seasonal PF 01/27/2014, 06/09/2016, 02/19/2017, 02/11/2018   Influenza,trivalent, recombinat, inj, PF 02/24/2015   Influenza-Unspecified 01/16/2019   PFIZER(Purple Top)SARS-COV-2 Vaccination 06/08/2019, 06/30/2019, 01/22/2020   Pneumococcal Conjugate-13 02/17/2019   Pneumococcal Polysaccharide-23 03/09/2020   Tdap 07/18/2016   Zoster Recombinat (Shingrix) 09/02/2019    TDAP status: Up to date  Flu Vaccine status: Up to date  Pneumococcal vaccine status: Up to date  Covid-19 vaccine status: Completed vaccines  Qualifies for Shingles Vaccine? Yes   Zostavax completed No   Shingrix Completed?: No.    Education has been provided regarding the importance of this vaccine. Patient has been advised to call insurance company to determine out of pocket expense if they have not yet received this vaccine. Advised may also receive vaccine at local pharmacy or Health Dept. Verbalized acceptance and understanding.  Screening Tests Health Maintenance  Topic Date Due   COVID-19 Vaccine (4 - Pfizer risk series) 03/18/2020   INFLUENZA VACCINE  12/13/2021   DEXA SCAN  03/03/2023   TETANUS/TDAP  07/19/2026   Pneumonia Vaccine 30+ Years old  Completed   HPV VACCINES  Aged Out   Zoster Vaccines- Shingrix  Discontinued    Health Maintenance  Health Maintenance Due  Topic Date Due   COVID-19 Vaccine (4 - Pfizer risk series) 03/18/2020   INFLUENZA VACCINE  12/13/2021    Colorectal cancer screening: No longer required.   Mammogram status: Completed 4//2023. Repeat every year  Bone Density status: Completed 02/12/2021. Results reflect: Bone density results: OSTEOPOROSIS. Repeat every 2 years.  Lung Cancer Screening: (Low Dose CT Chest recommended if Age 41-80 years, 30 pack-year currently smoking OR have quit w/in 15years.) does not qualify.   Additional  Screening:  Hepatitis C Screening: does not qualify  Vision Screening: Recommended annual ophthalmology exams for early detection of glaucoma and other disorders of the eye. Is the patient up to date with their annual eye exam?  Yes  Who is the provider or what is the name of the office in which the patient attends annual eye exams? [place] If pt is not established with a provider, would they like to be referred to a provider to establish care? No .   Dental Screening: Recommended annual dental exams for proper oral hygiene  Community Resource Referral / Chronic Care Management: CRR required this visit?  No   CCM required this visit?  No      Plan:     I have personally reviewed and noted the following in the patient's chart:   Medical and social history Use of alcohol, tobacco or illicit drugs  Current medications and supplements including opioid prescriptions.  Functional ability and status Nutritional status Physical activity Advanced directives List of other physicians Hospitalizations, surgeries, and ER visits in previous 12 months Vitals Screenings to include cognitive, depression, and falls Referrals and appointments  In addition, I have reviewed and discussed with patient certain preventive protocols, quality metrics, and best practice recommendations. A written personalized care plan for preventive services as well as general preventive health recommendations were provided to patient.     Melissa Rhinehart E Carlee Tesfaye,  LPN   1/66/0600   Nurse Notes: None

## 2021-12-22 NOTE — Telephone Encounter (Signed)
Encounter closed, MRI was done 12/16/21

## 2021-12-23 ENCOUNTER — Telehealth: Payer: Self-pay

## 2021-12-23 NOTE — Telephone Encounter (Signed)
   Telephone encounter was:  Successful.  12/23/2021 Name: [name] MRN: [id number] DOB: 09-26-1940  Melissa Zavala is a 81 y.o. year old female who is a primary care patient of Loman Brooklyn, FNP . The community resource team was consulted for assistance with Food Insecurity and grab bars.  Care guide performed the following interventions: Spoke with patient about [facility] referrals for food stamps, [id number] food pantry and Independent Living/Vocational Rehab.  Follow Up Plan:  Care guide will follow up with patient by phone over the next 7 days.  Gyanna Jarema, AAS Paralegal, [place] [facility]  [address], [place] [address] ??[email or web address]  ?? [phone]   [email or web address]

## 2021-12-26 ENCOUNTER — Other Ambulatory Visit: Payer: Medicare Other

## 2021-12-26 DIAGNOSIS — R609 Edema, unspecified: Secondary | ICD-10-CM

## 2021-12-27 ENCOUNTER — Telehealth: Payer: Self-pay

## 2021-12-27 LAB — BASIC METABOLIC PANEL
BUN/Creatinine Ratio: 16 (ref 12–28)
BUN: 12 mg/dL (ref 8–27)
CO2: 22 mmol/L (ref 20–29)
Calcium: 10.2 mg/dL (ref 8.7–10.3)
Chloride: 104 mmol/L (ref 96–106)
Creatinine, Ser: 0.74 mg/dL (ref 0.57–1.00)
Glucose: 93 mg/dL (ref 70–99)
Potassium: 4.2 mmol/L (ref 3.5–5.2)
Sodium: 141 mmol/L (ref 134–144)
eGFR: 81 mL/min/{1.73_m2} (ref >59)

## 2021-12-27 NOTE — Telephone Encounter (Signed)
   Telephone encounter was:  Successful.  12/27/2021 Name: [name] MRN: [id number] DOB: 03-Oct-1940  Melissa Zavala is a 81 y.o. year old female who is a primary care patient of Loman Brooklyn, FNP . The community resource team was consulted for assistance with Food Insecurity and grab bars for bathroom.  Care guide performed the following interventions: SNAP referral has been accepted. S. Smalls spoke wth the patient is already receiving FNS benefits.  [id number] referral patient has been contacted and assisted with food pantry, lunches and produce market information. Independent Living has accepted referral sent for grab bars and will be contacting the patient soon.   Follow Up Plan:  No further follow up planned at this time. The patient has been provided with needed resources.  Ty Buntrock, AAS Paralegal, [place] [facility]  [address], [place] [address] ??[email or web address]  ?? [phone]   [email or web address]

## 2022-01-03 ENCOUNTER — Ambulatory Visit (INDEPENDENT_AMBULATORY_CARE_PROVIDER_SITE_OTHER): Payer: Medicare Other | Admitting: Obstetrics & Gynecology

## 2022-01-03 ENCOUNTER — Other Ambulatory Visit (HOSPITAL_COMMUNITY)
Admission: RE | Admit: 2022-01-03 | Discharge: 2022-01-03 | Disposition: A | Discharge status: Home / Self Care | Payer: Medicare Other | Source: Ambulatory Visit | Attending: Obstetrics & Gynecology | Admitting: Obstetrics & Gynecology

## 2022-01-03 ENCOUNTER — Encounter: Payer: Self-pay | Admitting: Obstetrics & Gynecology

## 2022-01-03 VITALS — BP 146/84 | HR 82 | Wt 208.0 lb

## 2022-01-03 DIAGNOSIS — L309 Dermatitis, unspecified: Secondary | ICD-10-CM | POA: Diagnosis not present

## 2022-01-03 DIAGNOSIS — N898 Other specified noninflammatory disorders of vagina: Secondary | ICD-10-CM | POA: Diagnosis present

## 2022-01-03 DIAGNOSIS — E669 Obesity, unspecified: Secondary | ICD-10-CM

## 2022-01-03 DIAGNOSIS — L292 Pruritus vulvae: Secondary | ICD-10-CM

## 2022-01-03 DIAGNOSIS — N838 Other noninflammatory disorders of ovary, fallopian tube and broad ligament: Secondary | ICD-10-CM

## 2022-01-03 MED ORDER — FLUOCINONIDE 0.05 % EX OINT: 1.0000 | TOPICAL_OINTMENT | Freq: Every day | CUTANEOUS | 0 refills | Status: DC | PRN

## 2022-01-03 NOTE — Progress Notes (Signed)
GYN VISIT Patient name: Melissa Zavala MRN [id number]  Date of birth: 01-15-1941 Chief Complaint:   adnexal mass  History of Present Illness:   Melissa Zavala is a 81 y.o. PM female who presents for the following concerns:  -Adnexal mass:  Initially, she had noted pain on left side- sharp stabbing pain that radiated to her back.  She had gone to the ER and work up including both a [place] and MRI.  Results were suggestive of a left ovarian mass.   Due to an adrenal mass, an abdominal MRI was also completed.  The MRI was removed and a 2.9 cm benign left ovarian cyst was seen.   In terms of her pain, pain has improved on its own with rest, only feels some soreness.  Pt reports remote h/o ovarian cyst about 10-36yr ago that was followed by her prior OB/GYN.  She also notes vulvar itching and irritation.  Denies vaginal bleeding or discharge.   No LMP recorded. Patient is postmenopausal.     01/03/2022    1:40 PM 12/22/2021    3:03 PM 10/27/2021    3:39 PM 07/27/2021   11:47 AM 04/28/2021   10:02 AM  Depression screen PHQ 2/9  Decreased Interest 0 0 0 0 0  Down, Depressed, Hopeless 0 0 0 0 0  PHQ - 2 Score 0 0 0 0 0  Altered sleeping 0 0 0 0 0  Tired, decreased energy 0 0 0 0 0  Change in appetite 0 0 0 0 0  Feeling bad or failure about yourself  0 0 0 0 0  Trouble concentrating 0 0 0 0 0  Moving slowly or fidgety/restless 0 0 0 0 0  Suicidal thoughts 0 0 0 0 0  PHQ-9 Score 0 0 0 0 0  Difficult doing work/chores  Not difficult at all Not difficult at all Not difficult at all Not difficult at all     Review of Systems:   Pertinent items are noted in HPI Denies fever/chills, dizziness, headaches, visual disturbances, fatigue, shortness of breath, chest pain, abdominal pain, vomiting. Pertinent History Reviewed:  Reviewed past medical,surgical, social, obstetrical and family history.  Reviewed problem list, medications and allergies. Physical Assessment:   Vitals:   01/03/22 1341   BP: (!) 146/84  Pulse: 82  Weight: 208 lb (94.3 kg)  Body mass index is 39.3 kg/m.       Physical Examination:   General appearance: alert, well appearing, and in no distress  Psych: mood appropriate, normal affect  Skin: warm & dry   Cardiovascular: normal heart rate noted  Respiratory: normal respiratory effort, no distress  Abdomen: soft, non-tender, no rebound, no guarding  Pelvic: VULVA: hypopigmentation and thickening of upper portion of labia majora and clitoral hood noted, no discrete mass or lesion, VAGINA: normal appearing vagina with normal color and discharge, no lesions, CERVIX: normal appearing cervix without discharge or lesions, UTERUS: uterus is normal size, shape, consistency and nontender, ADNEXA: normal adnexa in size, nontender and no masses  Extremities: no edema   Chaperone:  Dr. 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    Dispense:  30 g    Refill:  0     Return in about 2 months (around 03/05/2022) for med follow up and pelvic US.   Janyth Pupa, DO Attending Rosa Sanchez, [facility] for [employer], [place]

## 2022-01-05 ENCOUNTER — Ambulatory Visit (HOSPITAL_COMMUNITY): Payer: Medicare Other | Attending: Cardiology

## 2022-01-05 DIAGNOSIS — I340 Nonrheumatic mitral (valve) insufficiency: Secondary | ICD-10-CM | POA: Diagnosis not present

## 2022-01-05 DIAGNOSIS — I10 Essential (primary) hypertension: Secondary | ICD-10-CM | POA: Diagnosis not present

## 2022-01-05 DIAGNOSIS — E785 Hyperlipidemia, unspecified: Secondary | ICD-10-CM | POA: Diagnosis not present

## 2022-01-05 DIAGNOSIS — R609 Edema, unspecified: Secondary | ICD-10-CM | POA: Diagnosis not present

## 2022-01-05 DIAGNOSIS — I517 Cardiomegaly: Secondary | ICD-10-CM

## 2022-01-05 LAB — CERVICOVAGINAL ANCILLARY ONLY
Bacterial Vaginitis (gardnerella): NEGATIVE
Candida Glabrata: NEGATIVE
Candida Vaginitis: NEGATIVE
Comment: NEGATIVE
Comment: NEGATIVE
Comment: NEGATIVE

## 2022-01-05 LAB — ECHOCARDIOGRAM COMPLETE
Area-P 1/2: 3.95 cm2
S' Lateral: 3.2 cm

## 2022-01-06 ENCOUNTER — Encounter: Payer: Self-pay | Admitting: Family Medicine

## 2022-01-06 ENCOUNTER — Ambulatory Visit (INDEPENDENT_AMBULATORY_CARE_PROVIDER_SITE_OTHER): Payer: Medicare Other | Admitting: Family Medicine

## 2022-01-06 VITALS — BP 145/75 | HR 60 | Temp 96.0°F | Ht 61.0 in | Wt 207.2 lb

## 2022-01-06 DIAGNOSIS — N83202 Unspecified ovarian cyst, left side: Secondary | ICD-10-CM

## 2022-01-06 NOTE — Progress Notes (Unsigned)
Assessment & Plan:  1. Left ovarian cyst Keep upcoming appointment with OB/GYN for ongoing management.   Follow up plan: Return in about 6 weeks (around 02/17/2022) for follow-up of chronic medication conditions with T. Lilia Pro.  Hendricks Limes, MSN, APRN, FNP-C [facility]  Subjective:   Patient ID: Melissa Zavala, female    DOB: Dec 08, 1940, 81 y.o.   MRN: [id number]  HPI: Melissa Zavala is a 81 y.o. female presenting on 01/06/2022 for 1 month follow up (Scans per G)  Patient initially scheduled this appointment as a 1 month follow-up due to an abnormal CT of the abdomen.  Since that time she has had a MRI of her abdomen showing that the 2 lesions seen initially on CT were benign.  One of which is fact of a benign adenoma; the other is a benign-appearing cyst of the left adnexa.  She has also seen an OB/GYN the plan to complete a pelvic ultrasound in 2 months.   ROS: Negative unless specifically indicated above in HPI.   Relevant past medical history reviewed and updated as indicated.   Allergies and medications reviewed and updated.   Current Outpatient Medications:    albuterol (VENTOLIN HFA) 108 (90 Base) MCG/ACT inhaler, Inhale 2 puffs into the lungs every 6 (six) hours as needed for wheezing or shortness of breath., Disp: 18 g, Rfl: 0   alendronate (FOSAMAX) 70 MG tablet, Take 1 tablet (70 mg total) by mouth every 7 (seven) days. Take with a full glass of water on an empty stomach., Disp: 12 tablet, Rfl: 1   amLODipine (NORVASC) 5 MG tablet, Take 1 tablet (5 mg total) by mouth daily., Disp: 90 tablet, Rfl: 3   CALCIUM PO, Take by mouth daily., Disp: , Rfl:    celecoxib (CELEBREX) 200 MG capsule, 1 tablet daily, Disp: 90 capsule, Rfl: 1   esomeprazole (NEXIUM) 40 MG capsule, 1 tablet daily, Disp: 90 capsule, Rfl: 1   fluocinonide ointment (LIDEX) 9.56 %, Apply 1 Application topically daily as needed., Disp: 30 g, Rfl: 0   furosemide (LASIX) 20 MG tablet, Take  40 mg by mouth daily for 1 week, then 20 mg by mouth as needed for lower extremity swelling, Disp: 30 tablet, Rfl: 0   HYDROcodone-acetaminophen (NORCO) 5-325 MG tablet, Take 1 tablet by mouth every 8 (eight) hours as needed for moderate pain., Disp: 90 tablet, Rfl: 0   HYDROcodone-acetaminophen (NORCO) 5-325 MG tablet, Take 1 tablet by mouth every 8 (eight) hours as needed for moderate pain., Disp: 90 tablet, Rfl: 0   HYDROcodone-acetaminophen (NORCO) 5-325 MG tablet, Take 1 tablet by mouth every 8 (eight) hours as needed for moderate pain., Disp: 90 tablet, Rfl: 0   levocetirizine (XYZAL) 5 MG tablet, Take 1 tablet (5 mg total) by mouth every evening., Disp: 90 tablet, Rfl: 1   lisinopril (ZESTRIL) 40 MG tablet, Take 1 tablet (40 mg total) by mouth daily., Disp: 90 tablet, Rfl: 1   potassium chloride (KLOR-CON) 10 MEQ tablet, Take 1 tablet (10 mEq total) by mouth 2 (two) times daily., Disp: 180 tablet, Rfl: 1   VITAMIN D PO, Take by mouth daily., Disp: , Rfl:   Allergies  Allergen Reactions   Iodinated Contrast Media Anaphylaxis   Diphenhydramine Other (See Comments)    Patient states "feels like somebody is inside of me clawing me out"   Codeine Other (See Comments)    Hallucinations   Statins Other (See Comments)    Joint pains and  weakness. Joint pains and weakness. Joint pains and weakness.   Zetia [Ezetimibe] Other (See Comments)    Aching in arms and legs.    Objective:   BP (!) 145/75   Pulse 60   Temp (!) 96 F (35.6 C) (Temporal)   Ht '5\' 1"'$  (1.549 m)   Wt 207 lb 3.2 oz (94 kg)   SpO2 97%   BMI 39.15 kg/m    Physical Exam Vitals reviewed.  Constitutional:      General: She is not in acute distress.    Appearance: Normal appearance. She is not ill-appearing, toxic-appearing or diaphoretic.  HENT:     Head: Normocephalic and atraumatic.  Eyes:     General: No scleral icterus.       Right eye: No discharge.        Left eye: No discharge.     Conjunctiva/sclera:  Conjunctivae normal.  Cardiovascular:     Rate and Rhythm: Normal rate.  Pulmonary:     Effort: Pulmonary effort is normal. No respiratory distress.  Musculoskeletal:        General: Normal range of motion.     Cervical back: Normal range of motion.  Skin:    General: Skin is warm and dry.     Capillary Refill: Capillary refill takes less than 2 seconds.  Neurological:     General: No focal deficit present.     Mental Status: She is alert and oriented to person, place, and time. Mental status is at baseline.  Psychiatric:        Mood and Affect: Mood normal.        Behavior: Behavior normal.        Thought Content: Thought content normal.        Judgment: Judgment normal.

## 2022-01-14 ENCOUNTER — Emergency Department (HOSPITAL_COMMUNITY): Payer: Medicare Other

## 2022-01-14 ENCOUNTER — Other Ambulatory Visit: Payer: Self-pay

## 2022-01-14 ENCOUNTER — Encounter (HOSPITAL_COMMUNITY): Payer: Self-pay

## 2022-01-14 ENCOUNTER — Emergency Department (HOSPITAL_COMMUNITY)
Admission: EM | Admit: 2022-01-14 | Discharge: 2022-01-14 | Disposition: A | Discharge status: Home / Self Care | Payer: Medicare Other | Attending: Emergency Medicine | Admitting: Emergency Medicine

## 2022-01-14 DIAGNOSIS — N838 Other noninflammatory disorders of ovary, fallopian tube and broad ligament: Secondary | ICD-10-CM | POA: Diagnosis not present

## 2022-01-14 DIAGNOSIS — M549 Dorsalgia, unspecified: Secondary | ICD-10-CM | POA: Diagnosis not present

## 2022-01-14 DIAGNOSIS — E785 Hyperlipidemia, unspecified: Secondary | ICD-10-CM | POA: Diagnosis not present

## 2022-01-14 DIAGNOSIS — R911 Solitary pulmonary nodule: Secondary | ICD-10-CM | POA: Diagnosis not present

## 2022-01-14 DIAGNOSIS — I10 Essential (primary) hypertension: Secondary | ICD-10-CM | POA: Diagnosis not present

## 2022-01-14 DIAGNOSIS — Z79899 Other long term (current) drug therapy: Secondary | ICD-10-CM | POA: Diagnosis not present

## 2022-01-14 DIAGNOSIS — I700 Atherosclerosis of aorta: Secondary | ICD-10-CM | POA: Diagnosis not present

## 2022-01-14 DIAGNOSIS — G8929 Other chronic pain: Secondary | ICD-10-CM | POA: Diagnosis not present

## 2022-01-14 DIAGNOSIS — E049 Nontoxic goiter, unspecified: Secondary | ICD-10-CM | POA: Insufficient documentation

## 2022-01-14 DIAGNOSIS — D72829 Elevated white blood cell count, unspecified: Secondary | ICD-10-CM | POA: Insufficient documentation

## 2022-01-14 DIAGNOSIS — E876 Hypokalemia: Secondary | ICD-10-CM | POA: Diagnosis not present

## 2022-01-14 DIAGNOSIS — Z743 Need for continuous supervision: Secondary | ICD-10-CM | POA: Diagnosis not present

## 2022-01-14 DIAGNOSIS — R109 Unspecified abdominal pain: Secondary | ICD-10-CM | POA: Diagnosis not present

## 2022-01-14 DIAGNOSIS — R918 Other nonspecific abnormal finding of lung field: Secondary | ICD-10-CM | POA: Diagnosis not present

## 2022-01-14 DIAGNOSIS — N949 Unspecified condition associated with female genital organs and menstrual cycle: Secondary | ICD-10-CM

## 2022-01-14 DIAGNOSIS — R509 Fever, unspecified: Secondary | ICD-10-CM | POA: Diagnosis not present

## 2022-01-14 DIAGNOSIS — D3501 Benign neoplasm of right adrenal gland: Secondary | ICD-10-CM | POA: Diagnosis not present

## 2022-01-14 DIAGNOSIS — Z20822 Contact with and (suspected) exposure to covid-19: Secondary | ICD-10-CM | POA: Insufficient documentation

## 2022-01-14 DIAGNOSIS — R079 Chest pain, unspecified: Secondary | ICD-10-CM | POA: Diagnosis not present

## 2022-01-14 DIAGNOSIS — R6889 Other general symptoms and signs: Secondary | ICD-10-CM | POA: Diagnosis not present

## 2022-01-14 DIAGNOSIS — R000 Tachycardia, unspecified: Secondary | ICD-10-CM | POA: Diagnosis not present

## 2022-01-14 LAB — CBC WITH DIFFERENTIAL/PLATELET
Abs Immature Granulocytes: 0.13 10*3/uL — ABNORMAL HIGH (ref 0.00–0.07)
Basophils Absolute: 0 10*3/uL (ref 0.0–0.1)
Basophils Relative: 0 %
Eosinophils Absolute: 0 10*3/uL (ref 0.0–0.5)
Eosinophils Relative: 0 %
HCT: 35.2 % — ABNORMAL LOW (ref 36.0–46.0)
Hemoglobin: 11.6 g/dL — ABNORMAL LOW (ref 12.0–15.0)
Immature Granulocytes: 1 %
Lymphocytes Relative: 3 %
Lymphs Abs: 0.6 10*3/uL — ABNORMAL LOW (ref 0.7–4.0)
MCH: 27.4 pg (ref 26.0–34.0)
MCHC: 33 g/dL (ref 30.0–36.0)
MCV: 83 fL (ref 80.0–100.0)
Monocytes Absolute: 1.1 10*3/uL — ABNORMAL HIGH (ref 0.1–1.0)
Monocytes Relative: 6 %
Neutro Abs: 17.7 10*3/uL — ABNORMAL HIGH (ref 1.7–7.7)
Neutrophils Relative %: 90 %
Platelets: 206 10*3/uL (ref 150–400)
RBC: 4.24 MIL/uL (ref 3.87–5.11)
RDW: 13.8 % (ref 11.5–15.5)
WBC: 19.6 10*3/uL — ABNORMAL HIGH (ref 4.0–10.5)
nRBC: 0 % (ref 0.0–0.2)

## 2022-01-14 LAB — URINALYSIS, ROUTINE W REFLEX MICROSCOPIC
Bilirubin Urine: NEGATIVE
Glucose, UA: NEGATIVE mg/dL
Hgb urine dipstick: NEGATIVE
Ketones, ur: NEGATIVE mg/dL
Leukocytes,Ua: NEGATIVE
Nitrite: NEGATIVE
Protein, ur: NEGATIVE mg/dL
Specific Gravity, Urine: 1.01 (ref 1.005–1.030)
pH: 7 (ref 5.0–8.0)

## 2022-01-14 LAB — COMPREHENSIVE METABOLIC PANEL
ALT: 13 U/L (ref 0–44)
AST: 17 U/L (ref 15–41)
Albumin: 3.9 g/dL (ref 3.5–5.0)
Alkaline Phosphatase: 62 U/L (ref 38–126)
Anion gap: 7 (ref 5–15)
BUN: 15 mg/dL (ref 8–23)
CO2: 25 mmol/L (ref 22–32)
Calcium: 9.7 mg/dL (ref 8.9–10.3)
Chloride: 105 mmol/L (ref 98–111)
Creatinine, Ser: 0.62 mg/dL (ref 0.44–1.00)
GFR, Estimated: >60 mL/min (ref >60)
Glucose, Bld: 122 mg/dL — ABNORMAL HIGH (ref 70–99)
Potassium: 3.4 mmol/L — ABNORMAL LOW (ref 3.5–5.1)
Sodium: 137 mmol/L (ref 135–145)
Total Bilirubin: 0.8 mg/dL (ref 0.3–1.2)
Total Protein: 6.9 g/dL (ref 6.5–8.1)

## 2022-01-14 LAB — LACTIC ACID, PLASMA
Lactic Acid, Venous: 0.9 mmol/L (ref 0.5–1.9)
Lactic Acid, Venous: 1.5 mmol/L (ref 0.5–1.9)

## 2022-01-14 LAB — SARS CORONAVIRUS 2 BY RT PCR: SARS Coronavirus 2 by RT PCR: NEGATIVE

## 2022-01-14 LAB — CBG MONITORING, ED: Glucose-Capillary: 117 mg/dL — ABNORMAL HIGH (ref 70–99)

## 2022-01-14 LAB — LIPASE, BLOOD: Lipase: 26 U/L (ref 11–51)

## 2022-01-14 MED ORDER — SODIUM CHLORIDE 0.9 % IV SOLN: INTRAVENOUS | Status: DC

## 2022-01-14 MED ORDER — ONDANSETRON HCL 4 MG/2ML IJ SOLN
4.0000 mg | Freq: Once | INTRAMUSCULAR | Status: AC
  Administered 2022-01-14: 4 mg via INTRAVENOUS
  Filled 2022-01-14: qty 2

## 2022-01-14 MED ORDER — HYDROMORPHONE HCL 1 MG/ML IJ SOLN
1.0000 mg | Freq: Once | INTRAMUSCULAR | Status: AC
  Administered 2022-01-14: 1 mg via INTRAVENOUS
  Filled 2022-01-14: qty 1

## 2022-01-14 NOTE — ED Notes (Signed)
Patient is aware we need a urine sample

## 2022-01-14 NOTE — Discharge Instructions (Signed)
Work-up required for the left adnexal cyst.  Follow-up with OB/GYN or back with your doctors.  This could possibly represent an ovarian cancer.  Otherwise work-up without any acute findings.

## 2022-01-14 NOTE — ED Triage Notes (Addendum)
Pt c/o upper and lower back pain x4 hours with frequent urination and fever. Took hydrocodone and amlodipine at 0230. Hx of htn

## 2022-01-14 NOTE — ED Provider Notes (Signed)
[facility] EMERGENCY DEPARTMENT Provider Note   CSN: [id number] Arrival date & time: 01/14/22  3267     History  Chief Complaint  Patient presents with   Back Pain    Melissa Zavala is a 81 y.o. female.  Patient presenting with a complaint of sort of pain all over.  Patient has a history of chronic pain.  Complains about back pain that is more chronic but she has been having more pain over on the left flank area.  Patient seen in July for this had CT renal which showed an adnexal cyst or mass on that side raising some concerns for possible neoplasm.  Patient seen by primary care doctor and they were planning on watching it.  Patient did have low-grade fever at 100.  So work-up included concerns for possible infection.  She states that she had a little bit of a runny nose.  She was brought in by EMS.  Patient took hydrocodone for the pain without much help.  Past medical history significant for hypertension hyperlipidemia osteoporosis chronic pain.       Home Medications Prior to Admission medications   Medication Sig Start Date End Date Taking? Authorizing Provider  albuterol (VENTOLIN HFA) 108 (90 Base) MCG/ACT inhaler Inhale 2 puffs into the lungs every 6 (six) hours as needed for wheezing or shortness of breath. 10/30/21  Yes Loman Brooklyn, FNP  alendronate (FOSAMAX) 70 MG tablet Take 1 tablet (70 mg total) by mouth every 7 (seven) days. Take with a full glass of water on an empty stomach. 10/27/21  Yes Hendricks Limes F, FNP  amLODipine (NORVASC) 5 MG tablet Take 1 tablet (5 mg total) by mouth daily. 12/19/21  Yes Conte, Tessa N, PA-C  CALCIUM PO Take 1 tablet by mouth daily.   Yes [provider]  celecoxib (CELEBREX) 200 MG capsule 1 tablet daily Patient taking differently: Take 200 mg by mouth daily. 1 tablet daily 07/27/21  Yes Hendricks Limes F, FNP  esomeprazole (NEXIUM) 40 MG capsule 1 tablet daily Patient taking differently: Take 40 mg by mouth daily at 12 noon. 1  tablet daily 07/27/21  Yes Hendricks Limes F, FNP  fluocinonide ointment (LIDEX) 1.24 % Apply 1 Application topically daily as needed. Patient taking differently: Apply 1 Application topically daily as needed (rash). 01/03/22  Yes Janyth Pupa, DO  furosemide (LASIX) 20 MG tablet Take 40 mg by mouth daily for 1 week, then 20 mg by mouth as needed for lower extremity swelling Patient taking differently: Take 20 mg by mouth daily. Take 40 mg by mouth daily for 1 week, then 20 mg by mouth as needed for lower extremity swelling 12/19/21  Yes Harriet Pho, Tessa N, PA-C  HYDROcodone-acetaminophen (NORCO) 5-325 MG tablet Take 1 tablet by mouth every 8 (eight) hours as needed for moderate pain. 12/26/21  Yes Hendricks Limes F, FNP  levocetirizine (XYZAL) 5 MG tablet Take 1 tablet (5 mg total) by mouth every evening. 07/27/21  Yes Hendricks Limes F, FNP  lisinopril (ZESTRIL) 40 MG tablet Take 1 tablet (40 mg total) by mouth daily. 07/27/21  Yes Hendricks Limes F, FNP  potassium chloride (KLOR-CON) 10 MEQ tablet Take 1 tablet (10 mEq total) by mouth 2 (two) times daily. 07/27/21  Yes Hendricks Limes F, FNP  VITAMIN D PO Take 1 capsule by mouth daily.   Yes [provider]      Allergies    Iodinated contrast media, Diphenhydramine, Codeine, Statins, and Zetia [ezetimibe]  Review of Systems   Review of Systems  Constitutional:  Negative for chills and fever.  HENT:  Positive for congestion. Negative for ear pain and sore throat.   Eyes:  Negative for pain and visual disturbance.  Respiratory:  Negative for cough and shortness of breath.   Cardiovascular:  Negative for chest pain and palpitations.  Gastrointestinal:  Negative for abdominal pain and vomiting.  Genitourinary:  Positive for flank pain. Negative for dysuria and hematuria.  Musculoskeletal:  Positive for back pain and myalgias. Negative for arthralgias.  Skin:  Negative for color change and rash.  Neurological:  Negative for seizures and syncope.   All other systems reviewed and are negative.   Physical Exam Updated Vital Signs BP (!) 150/63   Pulse 85   Temp 99.6 F (37.6 C)   Resp 18   Ht 1.549 m ('5\' 1"'$ )   Wt 93 kg   SpO2 96%   BMI 38.74 kg/m  Physical Exam Vitals and nursing note reviewed.  Constitutional:      General: She is not in acute distress.    Appearance: Normal appearance. She is well-developed.  HENT:     Head: Normocephalic and atraumatic.  Eyes:     Extraocular Movements: Extraocular movements intact.     Conjunctiva/sclera: Conjunctivae normal.     Pupils: Pupils are equal, round, and reactive to light.  Cardiovascular:     Rate and Rhythm: Normal rate and regular rhythm.     Heart sounds: No murmur heard. Pulmonary:     Effort: Pulmonary effort is normal. No respiratory distress.     Breath sounds: Normal breath sounds. No wheezing, rhonchi or rales.  Abdominal:     Palpations: Abdomen is soft.     Tenderness: There is no abdominal tenderness. There is no guarding.  Musculoskeletal:        General: No swelling.     Cervical back: Normal range of motion and neck supple. No rigidity.  Skin:    General: Skin is warm and dry.     Capillary Refill: Capillary refill takes less than 2 seconds.  Neurological:     General: No focal deficit present.     Mental Status: She is alert and oriented to person, place, and time.     Cranial Nerves: No cranial nerve deficit.     Sensory: No sensory deficit.  Psychiatric:        Mood and Affect: Mood normal.     ED Results / Procedures / Treatments   Labs (all labs ordered are listed, but only abnormal results are displayed) Labs Reviewed  CBC WITH DIFFERENTIAL/PLATELET - Abnormal; Notable for the following components:      Result Value   WBC 19.6 (*)    Hemoglobin 11.6 (*)    HCT 35.2 (*)    Neutro Abs 17.7 (*)    Lymphs Abs 0.6 (*)    Monocytes Absolute 1.1 (*)    Abs Immature Granulocytes 0.13 (*)    All other components within normal limits   COMPREHENSIVE METABOLIC PANEL - Abnormal; Notable for the following components:   Potassium 3.4 (*)    Glucose, Bld 122 (*)    All other components within normal limits  URINALYSIS, ROUTINE W REFLEX MICROSCOPIC - Abnormal; Notable for the following components:   Color, Urine STRAW (*)    All other components within normal limits  CBG MONITORING, ED - Abnormal; Notable for the following components:   Glucose-Capillary 117 (*)    All  other components within normal limits  SARS CORONAVIRUS 2 BY RT PCR  LIPASE, BLOOD  LACTIC ACID, PLASMA  LACTIC ACID, PLASMA    EKG EKG Interpretation  Date/Time:  Saturday January 14 2022 08:52:23 EDT Ventricular Rate:  101 PR Interval:  211 QRS Duration: 107 QT Interval:  345 QTC Calculation: 448 R Axis:   -32 Text Interpretation: Sinus tachycardia Borderline prolonged PR interval Abnormal R-wave progression, late transition LVH with secondary repolarization abnormality No significant change since last tracing Confirmed by Fredia Sorrow [id number]) on 01/14/2022 9:07:56 AM  Radiology CT Chest Wo Contrast  Result Date: 01/14/2022 CLINICAL DATA:  Respiratory illness and fever. EXAM: CT CHEST WITHOUT CONTRAST TECHNIQUE: Multidetector CT imaging of the chest was performed following the standard protocol without IV contrast. RADIATION DOSE REDUCTION: This exam was performed according to the departmental dose-optimization program which includes automated exposure control, adjustment of the mA and/or kV according to patient size and/or use of iterative reconstruction technique. COMPARISON:  Chest x-ray earlier today. FINDINGS: Cardiovascular: Atherosclerosis of the thoracic aorta without evidence of aneurysmal disease. Top-normal caliber of central pulmonary arteries with the main pulmonary artery measuring up to approximately 3 cm. Top-normal heart size with heavily calcified mitral valve annulus. Calcified coronary artery plaque present. Mediastinum/Nodes: No  enlarged mediastinal or axillary lymph nodes. Thyroid goiter with bilateral ill-defined nodularity. One area of nodularity in the left lobe measures roughly 1.5 cm. Trachea and esophagus demonstrate no significant findings. Lungs/Pleura: Adjacent smoothly marginated 7 mm and 5 mm oval nodules in the right minor fissure likely represent intrapulmonary lymph nodes. There is no evidence of pulmonary edema, consolidation, pneumothorax or pleural fluid. Upper Abdomen: No acute abnormality. Musculoskeletal: No chest wall mass or suspicious bone lesions identified. IMPRESSION: 1. No acute findings in the chest. 2. Thyroid goiter with ill-defined nodularity. One area of nodularity in the left lobe measures approximately 1.5 cm. Incidental heterogeneous and enlarged thyroid. Recommend non-emergent thyroid US. Reference: J Am Coll Radiol. 2015 Feb;12(2): 143-50 3. Smoothly marginated fissural nodules in the right minor fissure likely are benign intrapulmonary lymph nodes. Multiple pulmonary nodules. Most severe: 6 mm right pulmonary nodule, possibly an intrapulmonary lymph node. Per Fleischner Society Guidelines, no routine follow-up imaging is recommended. These guidelines do not apply to immunocompromised patients and patients with cancer. Follow up in patients with significant comorbidities as clinically warranted. For lung cancer screening, adhere to Lung-RADS guidelines. Reference: Radiology. 2017; 284(1):228-43; Radiology. 2012; 265(2):611-6; Radiology. 2010; 254(3):949-56. 4. Coronary and aortic atherosclerosis. Aortic Atherosclerosis (ICD10-I70.0). Electronically Signed   By: Aletta Edouard M.D.   On: 01/14/2022 12:21   CT Abdomen Pelvis Wo Contrast  Result Date: 01/14/2022 CLINICAL DATA:  Abdominal pain. EXAM: CT ABDOMEN AND PELVIS WITHOUT CONTRAST TECHNIQUE: Multidetector CT imaging of the abdomen and pelvis was performed following the standard protocol without IV contrast. RADIATION DOSE REDUCTION: This exam  was performed according to the departmental dose-optimization program which includes automated exposure control, adjustment of the mA and/or kV according to patient size and/or use of iterative reconstruction technique. COMPARISON:  11/27/2021 and abdominal MRI on 12/16/2021 FINDINGS: Lower chest: No acute abnormality. Hepatobiliary: No focal liver abnormality is seen. No gallstones, gallbladder wall thickening, or biliary dilatation. Pancreas: Unremarkable. No pancreatic ductal dilatation or surrounding inflammatory changes. Spleen: Normal in size without focal abnormality. Adrenals/Urinary Tract: Stable low-density right adrenal mass (10 Hounsfield units) measuring approximately 2.1 cm and consistent with adenoma. No hydronephrosis or renal calculi. The bladder is moderately distended. Stomach/Bowel: Bowel shows no evidence of obstruction,  ileus, inflammation or lesion. The appendix is normal. No free intraperitoneal air. Vascular/Lymphatic: Aortic atherosclerosis without evidence aneurysm. Heavily calcified plaque again noted in the proximal superior mesenteric artery. Correlation suggested with any intestinal angina. No enlarged lymph nodes identified. Reproductive: Stable 3.5 cm cystic abnormality of the left adnexa/ovary. At the patient's age, this should be further evaluated with pelvic ultrasound. No evidence of right-sided adnexal mass or gross uterine abnormality. Other: No abdominal wall hernia or abnormality. No abdominopelvic ascites. Musculoskeletal: No acute or significant osseous findings. IMPRESSION: 1. Stable benign right adrenal adenoma. 2. Aortic atherosclerosis without aneurysm. Heavily calcified plaque also present in the proximal SMA. Correlation suggested with any symptoms of intestinal angina/mesenteric arterial insufficiency. 3. 3.5 cm left adnexal cystic lesion. Predominantly cystic ovarian malignancy cannot be excluded by CT. Because this lesion is not adequately characterized, prompt [place]  is recommended for further evaluation. Note: This recommendation does not apply to premenarchal patients and to those with increased risk (genetic, family history, elevated tumor markers or other high-risk factors) of ovarian cancer. Reference: JACR 2020 Feb; 17(2):248-254 Electronically Signed   By: Aletta Edouard M.D.   On: 01/14/2022 12:11   DG Chest Port 1 View  Result Date: 01/14/2022 CLINICAL DATA:  81 year old female with back pain, fever, frequent urination. Being tested for COVID-19. EXAM: PORTABLE CHEST 1 VIEW COMPARISON:  Portable chest 11/27/2021 and earlier. FINDINGS: Portable AP upright view at 0836 hours. Stable mild cardiomegaly. Other mediastinal contours are within normal limits. Visualized tracheal air column is within normal limits. Stable lung volumes. Allowing for portable technique the lungs are clear. No pneumothorax or pleural effusion. Paucity of bowel gas in the visible abdomen. No acute osseous abnormality identified. IMPRESSION: No acute cardiopulmonary abnormality. Electronically Signed   By: Genevie Ann M.D.   On: 01/14/2022 08:51    Procedures Procedures    Medications Ordered in ED Medications  0.9 %  sodium chloride infusion (0 mLs Intravenous Stopped 01/14/22 1003)  ondansetron (ZOFRAN) injection 4 mg (4 mg Intravenous Given 01/14/22 0855)  HYDROmorphone (DILAUDID) injection 1 mg (1 mg Intravenous Given 01/14/22 0855)  HYDROmorphone (DILAUDID) injection 1 mg (1 mg Intravenous Given 01/14/22 1354)    ED Course/ Medical Decision Making/ A&P                           Medical Decision Making Amount and/or Complexity of Data Reviewed Labs: ordered. Radiology: ordered.  Risk Prescription drug management.   Patient improved significantly with pain medicine.  Initial work-up chest x-ray without any acute findings.  Lactic acid was less than 2.  Urinalysis normal.  She did have a significant leukocytosis with white blood cell count being 19,000 normal differential on that.   All elevated neutrophils.  Patient's COVID testing was negative.  Complete metabolic panel normal other than mild hypokalemia with a potassium of 3.4 renal function normal.  Lipase normal.  Based on this without any signs of infection decided to do CT chest abdomen and pelvis patient has a significant dye allergy so done without contrast.  This again showed the left adnexal cyst which is a little bit bigger in size compared to previous CT in July now measuring 3.5 cm cystic lesion malignancy cannot be ruled out.  Patient made aware of this and needs follow-up will refer to OB/GYN for additional follow-up.  CT chest no acute findings in the chest there is some evidence of thyroid [place] ill-defined nodularity 1 area of nodularity in  the left lobe measures approximately 1.5 cm incidental heterogeneous and enlarged thyroid recommend nonemergent thyroid ultrasound.  Also multiple pulmonary nodules.  Most severe is 6 mm right pulmonary nodule possibly intrapulmonary lymph node per the guidelines no routine follow-up imaging is recommended.  These guidelines do not apply to somebody's immunocompromise or has cancer.  This could be playing a role what ever the concern is with the left adnexal mass.  This explained to the patient's son and work-up of this left adnexal mass or cyst needs to be done promptly.  They will get her in for [facility] appointment [facility].  For a second opinion and to make sure that no further work-up is required   Final Clinical Impression(s) / ED Diagnoses Final diagnoses:  Adnexal cyst    Rx / DC Orders ED Discharge Orders     None         Fredia Sorrow, MD 01/14/22 1510

## 2022-01-17 DIAGNOSIS — L0390 Cellulitis, unspecified: Secondary | ICD-10-CM | POA: Diagnosis not present

## 2022-01-17 DIAGNOSIS — M79604 Pain in right leg: Secondary | ICD-10-CM | POA: Diagnosis not present

## 2022-01-17 DIAGNOSIS — B955 Unspecified streptococcus as the cause of diseases classified elsewhere: Secondary | ICD-10-CM | POA: Diagnosis not present

## 2022-01-17 DIAGNOSIS — R600 Localized edema: Secondary | ICD-10-CM | POA: Diagnosis not present

## 2022-01-17 DIAGNOSIS — Z791 Long term (current) use of non-steroidal anti-inflammatories (NSAID): Secondary | ICD-10-CM | POA: Diagnosis not present

## 2022-01-17 DIAGNOSIS — Z7983 Long term (current) use of bisphosphonates: Secondary | ICD-10-CM | POA: Diagnosis not present

## 2022-01-17 DIAGNOSIS — I10 Essential (primary) hypertension: Secondary | ICD-10-CM | POA: Diagnosis not present

## 2022-01-17 DIAGNOSIS — Z79899 Other long term (current) drug therapy: Secondary | ICD-10-CM | POA: Diagnosis not present

## 2022-01-17 DIAGNOSIS — B353 Tinea pedis: Secondary | ICD-10-CM | POA: Diagnosis not present

## 2022-01-17 DIAGNOSIS — L03115 Cellulitis of right lower limb: Secondary | ICD-10-CM | POA: Diagnosis not present

## 2022-01-18 ENCOUNTER — Encounter: Payer: Medicare Other | Admitting: Obstetrics & Gynecology

## 2022-01-18 DIAGNOSIS — L03115 Cellulitis of right lower limb: Secondary | ICD-10-CM | POA: Diagnosis not present

## 2022-01-18 DIAGNOSIS — B955 Unspecified streptococcus as the cause of diseases classified elsewhere: Secondary | ICD-10-CM | POA: Diagnosis not present

## 2022-01-18 DIAGNOSIS — L0390 Cellulitis, unspecified: Secondary | ICD-10-CM | POA: Diagnosis not present

## 2022-01-19 DIAGNOSIS — L03115 Cellulitis of right lower limb: Secondary | ICD-10-CM | POA: Diagnosis not present

## 2022-01-19 DIAGNOSIS — B955 Unspecified streptococcus as the cause of diseases classified elsewhere: Secondary | ICD-10-CM | POA: Diagnosis not present

## 2022-01-19 DIAGNOSIS — L0390 Cellulitis, unspecified: Secondary | ICD-10-CM | POA: Diagnosis not present

## 2022-01-23 ENCOUNTER — Encounter: Payer: Medicare Other | Admitting: Obstetrics & Gynecology

## 2022-01-24 ENCOUNTER — Other Ambulatory Visit: Payer: Self-pay | Admitting: Family Medicine

## 2022-01-24 ENCOUNTER — Telehealth: Payer: Self-pay | Admitting: Family Medicine

## 2022-01-24 DIAGNOSIS — K219 Gastro-esophageal reflux disease without esophagitis: Secondary | ICD-10-CM

## 2022-01-27 NOTE — Telephone Encounter (Signed)
Appointment set 09/20, 9:15am with Jonelle Sidle

## 2022-02-01 ENCOUNTER — Encounter: Payer: Self-pay | Admitting: Family Medicine

## 2022-02-01 ENCOUNTER — Ambulatory Visit (INDEPENDENT_AMBULATORY_CARE_PROVIDER_SITE_OTHER): Payer: Medicare Other | Admitting: Family Medicine

## 2022-02-01 VITALS — BP 161/80 | HR 73 | Temp 97.9°F | Ht 61.0 in | Wt 207.6 lb

## 2022-02-01 DIAGNOSIS — I10 Essential (primary) hypertension: Secondary | ICD-10-CM | POA: Diagnosis not present

## 2022-02-01 DIAGNOSIS — B353 Tinea pedis: Secondary | ICD-10-CM

## 2022-02-01 DIAGNOSIS — E876 Hypokalemia: Secondary | ICD-10-CM

## 2022-02-01 DIAGNOSIS — Z09 Encounter for follow-up examination after completed treatment for conditions other than malignant neoplasm: Secondary | ICD-10-CM

## 2022-02-01 DIAGNOSIS — L03115 Cellulitis of right lower limb: Secondary | ICD-10-CM | POA: Diagnosis not present

## 2022-02-01 NOTE — Progress Notes (Signed)
Established Patient Office Visit  Subjective   Patient ID: Melissa Zavala, female    DOB: [birth year]  Age: 81 y.o. MRN: [id number]  Chief Complaint  Patient presents with   Hospitalization Follow-up    9/5-9/7 [facility] Cellulitis right lower extremity    HPI [name] is here for a hospital follow up. She was admitted at [facility] from 01/17/22-01/19/22 for cellulitis of her right foot. She was given rocephin IV in the hospital and then discharged home with omnicef. She completed this on 01/25/22. She also had tinea pedis of her right foot. She has been using lotrim cream with good relief. She denies fever, chills, drainage. She reports pain has resolved and erythema has significantly improved. She continues to have some mild swelling in her RLE. She has been very active for the last few days and has not been elevating it as she previously was. She had a negative DVT US on the hospital. Her potassium was a little low in the hospital as well. She takes an oral potassium supplement BID. She reports that she has been rushing this morning. Her BP is normally well controlled. Denies chest pain, shortness of breath, focal weakness, dizziness, or palpitations.     ROS As per HPI.    Objective:     BP (!) 161/80   Pulse 73   Temp 97.9 F (36.6 C)   Ht _0  (1.549 m)   Wt 207 lb 9.6 oz (94.2 kg)   SpO2 96%   BMI 39.23 kg/m  BP Readings from Last 3 Encounters:  02/01/22 (!) 161/80  01/14/22 (!) 150/63  01/06/22 (!) 145/75      Physical Exam Vitals and nursing note reviewed.  Constitutional:      General: She is not in acute distress.    Appearance: She is not ill-appearing, toxic-appearing or diaphoretic.  HENT:     Head: Normocephalic and atraumatic.     Mouth/Throat:     Mouth: Mucous membranes are moist.     Pharynx: Oropharynx is clear.  Eyes:     Pupils: Pupils are equal, round, and reactive to light.  Cardiovascular:     Rate and Rhythm: Normal rate and regular rhythm.     Heart  sounds: Normal heart sounds. No murmur heard. Pulmonary:     Effort: Pulmonary effort is normal. No respiratory distress.     Breath sounds: Normal breath sounds.  Musculoskeletal:     Right lower leg: 1+ Edema (non pitting. No erythema, warmth, or tenderness.) present.     Left lower leg: No edema.  Skin:    General: Skin is warm and dry.     Comments: Tinea pedis resolved.   Neurological:     General: No focal deficit present.     Mental Status: She is alert and oriented to person, place, and time.  Psychiatric:        Mood and Affect: Mood normal.        Behavior: Behavior normal.      No results found for any visits on 02/01/22.    The ASCVD Risk score (Arnett DK, et al., 2019) failed to calculate for the following reasons:   The 2019 ASCVD risk score is only valid for ages 42 to 25    Assessment & Plan:   [name] was seen today for hospitalization follow-up.  Diagnoses and all orders for this visit:  Cellulitis of right lower leg Resolved.   Tinea pedis of right foot Resolved.   Primary  hypertension BP elevated today. Asymptomatic. Monitor BP at home and notify for persistently elevated readings.   Hypokalemia Will recheck as below.  [facility] discharge follow-up Reviewed hospital record, labs, and imaging results.   Return if symptoms worsen or fail to improve. Keep scheduled chronic follow up appt.   The patient indicates understanding of these issues and agrees with the plan.    Gwenlyn Perking, FNP

## 2022-02-01 NOTE — Patient Instructions (Signed)
Hypokalemia Hypokalemia means that the amount of potassium in the blood is lower than normal. Potassium is a mineral (electrolyte) that helps regulate the amount of fluid in the body. It also stimulates muscle tightening (contraction) and helps nerves work properly. Normally, most of the body's potassium is inside cells, and only a very small amount is in the blood. Because the amount in the blood is so small, minor changes to potassium levels in the blood can be life-threatening. What are the causes? This condition may be caused by: Antibiotic medicine. Diarrhea or vomiting. Taking too much of a medicine that helps you have a bowel movement (laxative) can cause diarrhea and lead to hypokalemia. Chronic kidney disease (CKD). Medicines that help the body get rid of excess fluid (diuretics). Eating disorders, such as anorexia or bulimia. Low magnesium levels in the body. Sweating a lot. What are the signs or symptoms? Symptoms of this condition include: Weakness. Constipation. Fatigue. Muscle cramps. Mental confusion. Skipped heartbeats or irregular heartbeat (palpitations). Tingling or numbness. How is this diagnosed? This condition is diagnosed with a blood test. How is this treated? This condition may be treated by: Taking potassium supplements. Adjusting the medicines that you take. Eating more foods that contain a lot of potassium. If your potassium level is very low, you may need to get potassium through an IV and be monitored in the hospital. Follow these instructions at home: Eating and drinking  Eat a healthy diet. A healthy diet includes fresh fruits and vegetables, whole grains, healthy fats, and lean proteins. If told, eat more foods that contain a lot of potassium. These include: Nuts, such as peanuts and pistachios. Seeds, such as sunflower seeds and pumpkin seeds. Peas, lentils, and lima beans. Whole grain and bran cereals and breads. Fresh fruits and vegetables,  such as apricots, avocado, bananas, cantaloupe, kiwi, oranges, tomatoes, asparagus, and potatoes. Juices, such as orange, tomato, and prune. Lean meats, including fish. Milk and milk products, such as yogurt. General instructions Take over-the-counter and prescription medicines only as told by your health care provider. This includes vitamins, natural food products, and supplements. Keep all follow-up visits. This is important. Contact a health care provider if: You have weakness that gets worse. You feel your heart pounding or racing. You vomit. You have diarrhea. You have diabetes and you have trouble keeping your blood sugar in your target range. Get help right away if: You have chest pain. You have shortness of breath. You have vomiting or diarrhea that lasts for more than 2 days. You faint. These symptoms may be an emergency. Get help right away. Call 911. Do not wait to see if the symptoms will go away. Do not drive yourself to the hospital. Summary Hypokalemia means that the amount of potassium in the blood is lower than normal. This condition is diagnosed with a blood test. Hypokalemia may be treated by taking potassium supplements, adjusting the medicines that you take, or eating more foods that are high in potassium. If your potassium level is very low, you may need to get potassium through an IV and be monitored in the hospital. This information is not intended to replace advice given to you by your health care provider. Make sure you discuss any questions you have with your health care provider. Document Revised: 01/13/2021 Document Reviewed: 01/13/2021 Elsevier Patient Education  2023 Elsevier Inc.  

## 2022-02-02 LAB — BMP8+EGFR
BUN/Creatinine Ratio: 20 (ref 12–28)
BUN: 15 mg/dL (ref 8–27)
CO2: 22 mmol/L (ref 20–29)
Calcium: 9.7 mg/dL (ref 8.7–10.3)
Chloride: 105 mmol/L (ref 96–106)
Creatinine, Ser: 0.75 mg/dL (ref 0.57–1.00)
Glucose: 102 mg/dL — ABNORMAL HIGH (ref 70–99)
Potassium: 4.4 mmol/L (ref 3.5–5.2)
Sodium: 142 mmol/L (ref 134–144)
eGFR: 80 mL/min/{1.73_m2} (ref >59)

## 2022-02-16 ENCOUNTER — Encounter: Payer: Self-pay | Admitting: Family Medicine

## 2022-02-16 ENCOUNTER — Ambulatory Visit (INDEPENDENT_AMBULATORY_CARE_PROVIDER_SITE_OTHER): Payer: Medicare Other | Admitting: Family Medicine

## 2022-02-16 VITALS — BP 161/69 | HR 81 | Temp 98.2°F | Ht 61.0 in | Wt 204.0 lb

## 2022-02-16 DIAGNOSIS — R7989 Other specified abnormal findings of blood chemistry: Secondary | ICD-10-CM

## 2022-02-16 DIAGNOSIS — E782 Mixed hyperlipidemia: Secondary | ICD-10-CM

## 2022-02-16 DIAGNOSIS — M159 Polyosteoarthritis, unspecified: Secondary | ICD-10-CM

## 2022-02-16 DIAGNOSIS — Z6838 Body mass index (BMI) 38.0-38.9, adult: Secondary | ICD-10-CM

## 2022-02-16 DIAGNOSIS — Z23 Encounter for immunization: Secondary | ICD-10-CM

## 2022-02-16 DIAGNOSIS — Z79899 Other long term (current) drug therapy: Secondary | ICD-10-CM

## 2022-02-16 DIAGNOSIS — J302 Other seasonal allergic rhinitis: Secondary | ICD-10-CM | POA: Diagnosis not present

## 2022-02-16 DIAGNOSIS — I10 Essential (primary) hypertension: Secondary | ICD-10-CM

## 2022-02-16 DIAGNOSIS — M5136 Other intervertebral disc degeneration, lumbar region: Secondary | ICD-10-CM

## 2022-02-16 DIAGNOSIS — K219 Gastro-esophageal reflux disease without esophagitis: Secondary | ICD-10-CM | POA: Diagnosis not present

## 2022-02-16 MED ORDER — HYDROCODONE-ACETAMINOPHEN 5-325 MG PO TABS: 1.0000 | ORAL_TABLET | Freq: Three times a day (TID) | ORAL | 0 refills | Status: DC | PRN

## 2022-02-16 MED ORDER — FLUTICASONE PROPIONATE 50 MCG/ACT NA SUSP: 2.0000 | Freq: Every day | NASAL | 6 refills | Status: AC

## 2022-02-16 NOTE — Patient Instructions (Signed)
Hypertension, Adult High blood pressure (hypertension) is when the force of blood pumping through the arteries is too strong. The arteries are the blood vessels that carry blood from the heart throughout the body. Hypertension forces the heart to work harder to pump blood and may cause arteries to become narrow or stiff. Untreated or uncontrolled hypertension can lead to a heart attack, heart failure, a stroke, kidney disease, and other problems. A blood pressure reading consists of a higher number over a lower number. Ideally, your blood pressure should be below 120/80. The first ("top") number is called the systolic pressure. It is a measure of the pressure in your arteries as your heart beats. The second ("bottom") number is called the diastolic pressure. It is a measure of the pressure in your arteries as the heart relaxes. What are the causes? The exact cause of this condition is not known. There are some conditions that result in high blood pressure. What increases the risk? Certain factors may make you more likely to develop high blood pressure. Some of these risk factors are under your control, including: Smoking. Not getting enough exercise or physical activity. Being overweight. Having too much fat, sugar, calories, or salt (sodium) in your diet. Drinking too much alcohol. Other risk factors include: Having a personal history of heart disease, diabetes, high cholesterol, or kidney disease. Stress. Having a family history of high blood pressure and high cholesterol. Having obstructive sleep apnea. Age. The risk increases with age. What are the signs or symptoms? High blood pressure may not cause symptoms. Very high blood pressure (hypertensive crisis) may cause: Headache. Fast or irregular heartbeats (palpitations). Shortness of breath. Nosebleed. Nausea and vomiting. Vision changes. Severe chest pain, dizziness, and seizures. How is this diagnosed? This condition is diagnosed by  measuring your blood pressure while you are seated, with your arm resting on a flat surface, your legs uncrossed, and your feet flat on the floor. The cuff of the blood pressure monitor will be placed directly against the skin of your upper arm at the level of your heart. Blood pressure should be measured at least twice using the same arm. Certain conditions can cause a difference in blood pressure between your right and left arms. If you have a high blood pressure reading during one visit or you have normal blood pressure with other risk factors, you may be asked to: Return on a different day to have your blood pressure checked again. Monitor your blood pressure at home for 1 week or longer. If you are diagnosed with hypertension, you may have other blood or imaging tests to help your health care provider understand your overall risk for other conditions. How is this treated? This condition is treated by making healthy lifestyle changes, such as eating healthy foods, exercising more, and reducing your alcohol intake. You may be referred for counseling on a healthy diet and physical activity. Your health care provider may prescribe medicine if lifestyle changes are not enough to get your blood pressure under control and if: Your systolic blood pressure is above 130. Your diastolic blood pressure is above 80. Your personal target blood pressure may vary depending on your medical conditions, your age, and other factors. Follow these instructions at home: Eating and drinking  Eat a diet that is high in fiber and potassium, and low in sodium, added sugar, and fat. An example of this eating plan is called the DASH diet. DASH stands for Dietary Approaches to Stop Hypertension. To eat this way: Eat   plenty of fresh fruits and vegetables. Try to fill one half of your plate at each meal with fruits and vegetables. Eat whole grains, such as whole-wheat pasta, brown rice, or whole-grain bread. Fill about one  fourth of your plate with whole grains. Eat or drink low-fat dairy products, such as skim milk or low-fat yogurt. Avoid fatty cuts of meat, processed or cured meats, and poultry with skin. Fill about one fourth of your plate with lean proteins, such as fish, chicken without skin, beans, eggs, or tofu. Avoid pre-made and processed foods. These tend to be higher in sodium, added sugar, and fat. Reduce your daily sodium intake. Many people with hypertension should eat less than 1,500 mg of sodium a day. Do not drink alcohol if: Your health care provider tells you not to drink. You are pregnant, may be pregnant, or are planning to become pregnant. If you drink alcohol: Limit how much you have to: 0-1 drink a day for women. 0-2 drinks a day for men. Know how much alcohol is in your drink. In the [place] one drink equals one 12 oz bottle of beer (355 mL), one 5 oz glass of wine (148 mL), or one 1 oz glass of hard liquor (44 mL). Lifestyle  Work with your health care provider to maintain a healthy body weight or to lose weight. Ask what an ideal weight is for you. Get at least 30 minutes of exercise that causes your heart to beat faster (aerobic exercise) most days of the week. Activities may include walking, swimming, or biking. Include exercise to strengthen your muscles (resistance exercise), such as Pilates or lifting weights, as part of your weekly exercise routine. Try to do these types of exercises for 30 minutes at least 3 days a week. Do not use any products that contain nicotine or tobacco. These products include cigarettes, chewing tobacco, and vaping devices, such as e-cigarettes. If you need help quitting, ask your health care provider. Monitor your blood pressure at home as told by your health care provider. Keep all follow-up visits. This is important. Medicines Take over-the-counter and prescription medicines only as told by your health care provider. Follow directions carefully. Blood  pressure medicines must be taken as prescribed. Do not skip doses of blood pressure medicine. Doing this puts you at risk for problems and can make the medicine less effective. Ask your health care provider about side effects or reactions to medicines that you should watch for. Contact a health care provider if you: Think you are having a reaction to a medicine you are taking. Have headaches that keep coming back (recurring). Feel dizzy. Have swelling in your ankles. Have trouble with your vision. Get help right away if you: Develop a severe headache or confusion. Have unusual weakness or numbness. Feel faint. Have severe pain in your chest or abdomen. Vomit repeatedly. Have trouble breathing. These symptoms may be an emergency. Get help right away. Call 911. Do not wait to see if the symptoms will go away. Do not drive yourself to the hospital. Summary Hypertension is when the force of blood pumping through your arteries is too strong. If this condition is not controlled, it may put you at risk for serious complications. Your personal target blood pressure may vary depending on your medical conditions, your age, and other factors. For most people, a normal blood pressure is less than 120/80. Hypertension is treated with lifestyle changes, medicines, or a combination of both. Lifestyle changes include losing weight, eating a healthy,   low-sodium diet, exercising more, and limiting alcohol. This information is not intended to replace advice given to you by your health care provider. Make sure you discuss any questions you have with your health care provider. Document Revised: 03/08/2021 Document Reviewed: 03/08/2021 Elsevier Patient Education  2023 Elsevier Inc.  

## 2022-02-16 NOTE — Progress Notes (Signed)
Established Patient Office Visit  Subjective   Patient ID: Melissa Zavala, female    DOB: 11-05-1940  Age: 81 y.o. MRN: [id number]  Chief Complaint  Patient presents with   Medical Management of Chronic Issues   Hyperlipidemia   Hypertension   Gastroesophageal Reflux    Hyperlipidemia  Hypertension  Gastroesophageal Reflux   Melissa Zavala is here for a chronic follow up today.   HTN Complaint with meds - Yes Current Medications - amlodipine 5 mg, lasix 20 mg prn, lisinopril 40 mg,  Checking BP at home ranging 140s/60s Pertinent ROS:  Headache - No Fatigue - No Visual Disturbances - No Chest pain - No Dyspnea - No Palpitations - No LE edema - mild, baseline They report good compliance with medications and can restate their regimen by memory. No medication side effects.  GERD Reports well controlled with medication  Pain assessment: Cause of pain- arthritis Pain location- legs & back Pain on scale of 1-10- 10/10 without medication; 5/10 with medication Frequency- comes and goes What increases pain- prolonged standing What makes pain better- rest and medication Effects on ADL- difficulty completing yard and house work Any change in general medical condition- none   Current opioids rx- Norco 5/325 mg Q8H PRN # meds rx- 90 Effectiveness of current meds- effective Adverse reactions form pain meds- none Morphine equivalent- 15 MME/day   Pill count performed-No Last drug screen - 04/28/2021 ( high risk q59m moderate risk q666mlow risk yearly ) Urine drug screen today- No Was the [place]art[place]viewed- Yes             If yes were their any concerning findings? - No   Overdose risk: 160  Pain contract signed on: 02/16/22  Seasonal allergies She takes xyzal today. She has been on this for years. She continues have nasal congestion, cough, and postnasal drip. Symptoms are worse at night first thing in the morning.     ROS Negative unless specially indicated above in HPI.    Objective:     BP (!) 161/69   Pulse 81   Temp 98.2 F (36.8 C) (Temporal)   Ht _0  (1.549 m)   Wt 204 lb (92.5 kg)   SpO2 98%   BMI 38.55 kg/m  BP Readings from Last 3 Encounters:  02/16/22 (!) 161/69  02/01/22 (!) 161/80  01/14/22 (!) 150/63      Physical Exam Vitals and nursing note reviewed.  Constitutional:      General: She is not in acute distress.    Appearance: She is not ill-appearing, toxic-appearing or diaphoretic.  HENT:     Nose: Nose normal.     Mouth/Throat:     Mouth: Mucous membranes are moist.     Pharynx: Oropharynx is clear.  Eyes:     Extraocular Movements: Extraocular movements intact.     Conjunctiva/sclera: Conjunctivae normal.  Neck:     Thyroid: No thyroid mass, thyromegaly or thyroid tenderness.  Cardiovascular:     Rate and Rhythm: Normal rate and regular rhythm.     Heart sounds: Normal heart sounds. No murmur heard. Pulmonary:     Effort: Pulmonary effort is normal. No respiratory distress.     Breath sounds: Normal breath sounds.  Abdominal:     General: Bowel sounds are normal. There is no distension.     Palpations: Abdomen is soft.     Tenderness: There is no abdominal tenderness. There is no guarding or rebound.  Musculoskeletal:  Right lower leg: No edema.     Left lower leg: No edema.  Skin:    General: Skin is warm and dry.  Neurological:     General: No focal deficit present.     Mental Status: She is alert and oriented to person, place, and time.  Psychiatric:        Mood and Affect: Mood normal.        Behavior: Behavior normal.     No results found for any visits on 02/16/22.    The ASCVD Risk score (Arnett DK, et al., 2019) failed to calculate for the following reasons:   The 2019 ASCVD risk score is only valid for ages 42 to 37    Assessment & Plan:   Melissa Zavala was seen today for medical management of chronic issues, hyperlipidemia, hypertension and gastroesophageal reflux.  Diagnoses and all orders  for this visit:  Primary hypertension Elevated today in the visit, well controlled at home. Labs pending.  -     CMP14+EGFR -     CBC with Differential/Platelet -     TSH  Mixed hyperlipidemia Hasn't tolerated statin and zetia. Last LDL was minimally elevated.   Gastroesophageal reflux disease without esophagitis Well controlled on current regimen.  -     CMP14+EGFR -     CBC with Differential/Platelet  Morbid obesity (HCC) BMI 38 with HLD, HTN.  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 Well controlled on current regimen. Will update CSA and toxassure today. PDMP reviewed, no red flags.  -     ToxASSURE Select 13 (MW), Urine -     HYDROcodone-acetaminophen (NORCO) 5-325 MG tablet; Take 1 tablet by mouth every 8 (eight) hours as needed for moderate pain. -     HYDROcodone-acetaminophen (NORCO) 5-325 MG tablet; Take 1 tablet by mouth every 8 (eight) hours as needed for moderate pain. -     HYDROcodone-acetaminophen (NORCO) 5-325 MG tablet; Take 1 tablet by mouth every 8 (eight) hours as needed for moderate pain.  Abnormal TSH Will repeat today.  -     TSH  Seasonal allergies Continue xyzal. Add flonase.  -     fluticasone (FLONASE) 50 MCG/ACT nasal spray; Place 2 sprays into both nostrils daily.  Flu vaccine today.   Return in about 3 months (around 05/19/2022) for chronic follow up.   The patient indicates understanding of these issues and agrees with the plan.  Gwenlyn Perking, FNP

## 2022-02-17 ENCOUNTER — Other Ambulatory Visit: Payer: Self-pay | Admitting: Family Medicine

## 2022-02-17 LAB — CBC WITH DIFFERENTIAL/PLATELET
Basophils Absolute: 0 10*3/uL (ref 0.0–0.2)
Basos: 0 %
EOS (ABSOLUTE): 0.2 10*3/uL (ref 0.0–0.4)
Eos: 2 %
Hematocrit: 36.5 % (ref 34.0–46.6)
Hemoglobin: 11.5 g/dL (ref 11.1–15.9)
Immature Grans (Abs): 0 10*3/uL (ref 0.0–0.1)
Immature Granulocytes: 0 %
Lymphocytes Absolute: 3 10*3/uL (ref 0.7–3.1)
Lymphs: 34 %
MCH: 26.6 pg (ref 26.6–33.0)
MCHC: 31.5 g/dL (ref 31.5–35.7)
MCV: 85 fL (ref 79–97)
Monocytes Absolute: 0.5 10*3/uL (ref 0.1–0.9)
Monocytes: 6 %
Neutrophils Absolute: 5 10*3/uL (ref 1.4–7.0)
Neutrophils: 58 %
Platelets: 241 10*3/uL (ref 150–450)
RBC: 4.32 x10E6/uL (ref 3.77–5.28)
RDW: 13.1 % (ref 11.7–15.4)
WBC: 8.8 10*3/uL (ref 3.4–10.8)

## 2022-02-17 LAB — CMP14+EGFR
ALT: 10 IU/L (ref 0–32)
AST: 14 IU/L (ref 0–40)
Albumin/Globulin Ratio: 1.8 (ref 1.2–2.2)
Albumin: 4.4 g/dL (ref 3.7–4.7)
Alkaline Phosphatase: 76 IU/L (ref 44–121)
BUN/Creatinine Ratio: 20 (ref 12–28)
BUN: 14 mg/dL (ref 8–27)
Bilirubin Total: 0.3 mg/dL (ref 0.0–1.2)
CO2: 26 mmol/L (ref 20–29)
Calcium: 10 mg/dL (ref 8.7–10.3)
Chloride: 101 mmol/L (ref 96–106)
Creatinine, Ser: 0.71 mg/dL (ref 0.57–1.00)
Globulin, Total: 2.5 g/dL (ref 1.5–4.5)
Glucose: 94 mg/dL (ref 70–99)
Potassium: 3.6 mmol/L (ref 3.5–5.2)
Sodium: 139 mmol/L (ref 134–144)
Total Protein: 6.9 g/dL (ref 6.0–8.5)
eGFR: 85 mL/min/{1.73_m2} (ref >59)

## 2022-02-17 LAB — TSH: TSH: 0.178 u[IU]/mL — ABNORMAL LOW (ref 0.450–4.500)

## 2022-02-20 LAB — TOXASSURE SELECT 13 (MW), URINE

## 2022-02-28 ENCOUNTER — Other Ambulatory Visit: Payer: Self-pay | Admitting: Family Medicine

## 2022-02-28 DIAGNOSIS — R609 Edema, unspecified: Secondary | ICD-10-CM

## 2022-02-28 DIAGNOSIS — M159 Polyosteoarthritis, unspecified: Secondary | ICD-10-CM

## 2022-03-07 ENCOUNTER — Other Ambulatory Visit: Payer: Self-pay | Admitting: Obstetrics & Gynecology

## 2022-03-07 DIAGNOSIS — N83202 Unspecified ovarian cyst, left side: Secondary | ICD-10-CM

## 2022-03-08 ENCOUNTER — Encounter: Payer: Self-pay | Admitting: Obstetrics & Gynecology

## 2022-03-08 ENCOUNTER — Ambulatory Visit (INDEPENDENT_AMBULATORY_CARE_PROVIDER_SITE_OTHER): Payer: Medicare Other | Admitting: Obstetrics & Gynecology

## 2022-03-08 ENCOUNTER — Other Ambulatory Visit (HOSPITAL_COMMUNITY)
Admission: RE | Admit: 2022-03-08 | Discharge: 2022-03-08 | Disposition: A | Discharge status: Home / Self Care | Payer: Medicare Other | Source: Ambulatory Visit | Attending: Obstetrics & Gynecology | Admitting: Obstetrics & Gynecology

## 2022-03-08 ENCOUNTER — Ambulatory Visit (INDEPENDENT_AMBULATORY_CARE_PROVIDER_SITE_OTHER): Payer: Medicare Other

## 2022-03-08 VITALS — BP 183/77 | HR 86 | Ht 63.0 in | Wt 204.0 lb

## 2022-03-08 DIAGNOSIS — L309 Dermatitis, unspecified: Secondary | ICD-10-CM

## 2022-03-08 DIAGNOSIS — N83202 Unspecified ovarian cyst, left side: Secondary | ICD-10-CM

## 2022-03-08 DIAGNOSIS — R030 Elevated blood-pressure reading, without diagnosis of hypertension: Secondary | ICD-10-CM | POA: Diagnosis not present

## 2022-03-08 DIAGNOSIS — N83201 Unspecified ovarian cyst, right side: Secondary | ICD-10-CM | POA: Diagnosis not present

## 2022-03-08 DIAGNOSIS — R8290 Unspecified abnormal findings in urine: Secondary | ICD-10-CM | POA: Insufficient documentation

## 2022-03-08 DIAGNOSIS — F418 Other specified anxiety disorders: Secondary | ICD-10-CM

## 2022-03-08 LAB — POCT URINALYSIS DIPSTICK OB
Blood, UA: NEGATIVE
Glucose, UA: NEGATIVE
Ketones, UA: NEGATIVE
Leukocytes, UA: NEGATIVE
Nitrite, UA: NEGATIVE
POC,PROTEIN,UA: NEGATIVE

## 2022-03-08 MED ORDER — NYSTATIN 100000 UNIT/GM EX CREA: TOPICAL_CREAM | CUTANEOUS | 0 refills | Status: DC

## 2022-03-08 NOTE — Progress Notes (Signed)
GYN VISIT Patient name: Melissa Zavala MRN [id number]  Date of birth: 17-Aug-1940 Chief Complaint:   Follow-up (Ovarian cyst follow up. Ultrasound completed today. /Complains of urine odor and vaginal itching./)  History of Present Illness:   Melissa Zavala is a 81 y.o. 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  She is not sure if this is truly helping, Today she notes a strong odor that comes/goes.  She was not sure how to describe, it but states it wasn't fishy.  Denies discharge.  Notes considerable itching- bilateral groin and under her abdomen.  Feels like she is "dry" all the time. Using vaseline and the Lidex and feels like the vaseline helps better than the Lidex.  -Left ovarian cyst- Incidental finding on Abd CT.  She denies pelvic or abdominal pain.  Denies bloating or decreased appetite.  [place] today shows: atrophic heterogeneous anteverted uterus,with multiple calcification,EEC 3.2 mm,complex unilocular right ovarian cyst 1.5 x .7 x 1.1 cm,simple unilocular left ovarian cyst 3.9 x 2.8 x 3.3 cm,mult small ovarian calcification,no free fluid  Prior MRI simple 2.9cm cyst.     No LMP recorded. Patient is postmenopausal.     02/16/2022    4:04 PM 02/01/2022    9:17 AM 01/06/2022    1:27 PM 01/03/2022    1:40 PM 12/22/2021    3:03 PM  Depression screen PHQ 2/9  Decreased Interest 0 0 0 0 0  Down, Depressed, Hopeless 0 0 0 0 0  PHQ - 2 Score 0 0 0 0 0  Altered sleeping 0  0 0 0  Tired, decreased energy 0  0 0 0  Change in appetite 0  0 0 0  Feeling bad or failure about yourself  0  0 0 0  Trouble concentrating 0  0 0 0  Moving slowly or fidgety/restless 0  0 0 0  Suicidal thoughts 0  0 0 0  PHQ-9 Score 0  0 0 0  Difficult doing work/chores Not difficult at all  Not difficult at all  Not difficult at all     Review of Systems:   Pertinent items are noted in HPI Denies fever/chills, dizziness,  headaches, visual disturbances, fatigue, shortness of breath, chest pain, abdominal pain, vomiting. Pertinent History Reviewed:  Reviewed past medical,surgical, social, obstetrical and family history.  Reviewed problem list, medications and allergies. Physical Assessment:   Vitals:   03/08/22 1416  BP: (!) 183/77  Pulse: 86  Weight: 204 lb (92.5 kg)  Height: '5\' 3"'$  (1.6 m)  Body mass index is 36.14 kg/m.       Physical Examination:   General appearance: alert, well appearing, and in no distress  Psych: mood appropriate, normal affect  Skin: warm & dry   Cardiovascular: normal heart rate noted  Respiratory: normal respiratory effort, no distress  Abdomen: soft, non-tender, no rebound, no guarding  Pelvic: VULVA: normal appearing vulva with no masses, tenderness or lesions, VAGINA: normal appearing vagina with normal color and discharge, no lesions, CERVIX: normal appearing cervix without discharge or lesions  Extremities: no edema   Chaperone:  Lawyer     Assessment & Plan:  1) Vulvar irritation -LS vs vitiligo- area of hypopigmentation not symptomatic -Possible intertrigo -prior vaginitis panel negative, plan to recheck today due to vaginal odor -Rx for nystatin cream sent in -reviewed conservative treatment, f/u in 29mo  2) Ovarian cyst -Based on [facility] benign  finding -discussed conservative vs surgical intervention -reviewed risk/benefit of surgical intervention.  Again reviewed while we cannot be 100% sure that this is not a malignancy, based on current findings low risk -discussed that while it does seem slightly bigger than prior CT imaging, not ideal comparison.  After much discussion, plan for repeat US in a few mos to re-assess size -should cyst remain stable, will hold off on surgical intervention   Orders Placed This Encounter  Procedures   POC Urinalysis Dipstick OB   Meds ordered this encounter  Medications   nystatin cream (MYCOSTATIN)     Sig: Apply 1-2x daily for 7 days then as needed    Dispense:  30 g    Refill:  0     Return in about 3 months (around 06/08/2022) for Medication follow up and repeat US.   Janyth Pupa, DO Attending [place], [facility] for [employer], [place]

## 2022-03-08 NOTE — Progress Notes (Signed)
PELVIC US TA/TV: atrophic heterogeneous anteverted uterus,with multiple calcification,EEC 3.2 mm,complex unilocular right ovarian cyst 1.5 x .7 x 1.1 cm,simple unilocular left ovarian cyst 3.9 x 2.8 x 3.3 cm,mult small ovarian calcification,no free fluid  Chaperone Whitney

## 2022-03-10 LAB — CERVICOVAGINAL ANCILLARY ONLY
Bacterial Vaginitis (gardnerella): POSITIVE — AB
Candida Glabrata: NEGATIVE
Candida Vaginitis: NEGATIVE
Comment: NEGATIVE
Comment: NEGATIVE
Comment: NEGATIVE

## 2022-03-13 ENCOUNTER — Other Ambulatory Visit: Payer: Self-pay | Admitting: Obstetrics & Gynecology

## 2022-03-13 DIAGNOSIS — L292 Pruritus vulvae: Secondary | ICD-10-CM

## 2022-03-13 MED ORDER — METRONIDAZOLE 1 % EX GEL: Freq: Every day | CUTANEOUS | 0 refills | Status: AC

## 2022-03-13 NOTE — Progress Notes (Signed)
Rx sent in for [place], DO Attending [place], [facility] for [facility], [place]

## 2022-03-20 NOTE — Progress Notes (Deleted)
Office Visit    Patient Name: Melissa Zavala Date of Encounter: 03/20/2022  PCP:  Gwenlyn Perking, [place]  Cardiologist:  Carlyle Dolly, MD  Advanced Practice Provider:  No care team member to display Electrophysiologist:  None   HPI    Melissa Zavala is a 81 y.o. female with a past medical history significant for lower extremity edema, arthritis, chronic pain, GERD, hypertension presents today for follow-up.  She was last seen in the office 2/21 by Dr. Harl Bowie.  She had had several appointments that were canceled since then.  At that time, she was having some dyspnea on exertion and lower extremity edema.  She was on Lasix as needed which was controlling her swelling.  She was recently in the ED 11/27/2021 and was having chest pain and left flank pain.  She had a CT scan to evaluate her kidneys which showed a questionable right adrenal mass and left adnexal mass.  She also had some hypertension while in the hospital.  Troponin was 10.  It does not appear like it was trended.  No significant EKG findings.  Referred back to cardiology for work-up of chest pain.  She was last seen in the clinic 12/19/21 by myself. At that time she shared that she is having some lower extremity edema.  She was recently in the hospital because she was feeling weak and she started to have chest and flank pain.  She states her chest pain came on after eating a banana and after she took a heartburn pill it got better.  She had some imaging done in the hospital which we reviewed.  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  I will leave this up to her primary to workup.  We have discussed increasing her Lasix for fluid overload and wearing compression hose/socks.  Luckily, no further chest pains.  Today, she ***  Past Medical History    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   Osteoporosis    Vitamin B12  deficiency 12/06/2017   Past Surgical History:  Procedure Laterality Date   TOTAL HIP ARTHROPLASTY Right    TOTAL KNEE ARTHROPLASTY Bilateral    TUBAL LIGATION      Allergies  Allergies  Allergen Reactions   Iodinated Contrast Media Anaphylaxis   Diphenhydramine Other (See Comments)    Patient states "feels like somebody is inside of me clawing me out"   Codeine Other (See Comments)    Hallucinations   Statins Other (See Comments)    Joint pains and weakness. Joint pains and weakness. Joint pains and weakness.   Zetia [Ezetimibe] Other (See Comments)    Aching in arms and legs.    EKGs/Labs/Other Studies Reviewed:   The following studies were reviewed today:   EKG:  EKG is  ordered today.   Recent Labs: 10/27/2021: BNP 49.8 02/16/2022: ALT 10; BUN 14; Creatinine, Ser 0.71; Hemoglobin 11.5; Platelets 241; Potassium 3.6; Sodium 139; TSH 0.178  Recent Lipid Panel    Component Value Date/Time   CHOL 209 (H) 10/27/2021 1630   TRIG 102 10/27/2021 1630   HDL 88 10/27/2021 1630   CHOLHDL 2.4 10/27/2021 1630   LDLCALC 103 (H) 10/27/2021 1630   Home Medications   No outpatient medications have been marked as taking for the 03/22/22 encounter (Appointment) with Elgie Collard, PA-C.     Review  of Systems      All other systems reviewed and are otherwise negative except as noted above.  Physical Exam    VS:  There were no vitals taken for this visit. , BMI There is no height or weight on file to calculate BMI.  Wt Readings from Last 3 Encounters:  03/08/22 204 lb (92.5 kg)  02/16/22 204 lb (92.5 kg)  02/01/22 207 lb 9.6 oz (94.2 kg)     GEN: Well nourished, well developed, in no acute distress. HEENT: normal. Neck: Supple, no JVD, carotid bruits, or masses. Cardiac: RRR, 3/6 systolic murmur, rubs, or gallops. No clubbing, cyanosis, 1-2+ pitting edema.  Radials/PT 2+ and equal bilaterally.  Respiratory:  Respirations regular and unlabored, clear to auscultation  bilaterally. GI: Soft, nontender, nondistended. MS: No deformity or atrophy. Skin: Warm and dry, no rash. Neuro:  Strength and sensation are intact. Psych: Normal affect.  Assessment & Plan    Chest pain This has resolved.  Thought to be due to acid reflux.  Patient had a banana and then took a heartburn pill and the pain got better. -Troponin flat in the hospital and no significant EKG changes -No further ischemic workup indicated at this time  SOB/DOE -Ms. Eardley is fluid overloaded on exam.  She has some pitting edema present in her lower extremity. -We have ordered Lasix 40 mg daily x 7 days.  She has been taking this medication as needed.  We will order a BMP next week to review her kidney function.  She can take 20 mg as needed after. -Recommended lower extremity compression hose/socks.  We have provided her with information on the Lasix therapy in  -Continue a low-sodium diet -We have decreased her Norvasc from 10 mg down to 5 mg and encouraged her to take her blood pressure regularly  Heart murmur -We will order a follow-up echocardiogram today -Last echocardiogram 2021 showed mild mitral regurgitation, normal systolic function, and mild diastolic dysfunction (grade 1)  Hypertension -Amlodipine '10mg'$  daily, and Lisinopril '40mg'$   -She has not taken any blood pressure medications today and her blood pressure is well-controlled.  We encouraged her to continue to monitor since we decreased her amlodipine to 5 mg daily  Hyperlipidemia -LDL 103 -Intolerant to any statin medications and also has an allergy to [place] -Discussed starting another medication if needed such as Repatha. -Her primary care looks after these numbers and has been making medication adjustments as indicated     Disposition: Follow up 3 months with Carlyle Dolly, MD or APP.  Signed, Elgie Collard, PA-C 03/20/2022, 9:56 AM [facility]

## 2022-03-21 ENCOUNTER — Ambulatory Visit: Payer: Medicare Other | Admitting: Physician Assistant

## 2022-03-22 ENCOUNTER — Ambulatory Visit: Payer: Medicare Other | Admitting: Physician Assistant

## 2022-03-22 ENCOUNTER — Encounter: Payer: Self-pay | Admitting: Physician Assistant

## 2022-03-22 ENCOUNTER — Ambulatory Visit: Payer: Medicare Other | Attending: Physician Assistant | Admitting: Physician Assistant

## 2022-03-22 VITALS — BP 138/72 | HR 68 | Ht 63.0 in | Wt 205.0 lb

## 2022-03-22 DIAGNOSIS — I340 Nonrheumatic mitral (valve) insufficiency: Secondary | ICD-10-CM | POA: Diagnosis not present

## 2022-03-22 DIAGNOSIS — R0609 Other forms of dyspnea: Secondary | ICD-10-CM | POA: Diagnosis not present

## 2022-03-22 DIAGNOSIS — R609 Edema, unspecified: Secondary | ICD-10-CM | POA: Diagnosis not present

## 2022-03-22 DIAGNOSIS — R079 Chest pain, unspecified: Secondary | ICD-10-CM

## 2022-03-22 NOTE — Progress Notes (Signed)
Cardiology Office Note:    Date:  03/22/2022   ID:  [name], DOB January 31, 1941, MRN [id number]  PCP:  Gwenlyn Perking, FNP  [facility] Cardiologist:  Carlyle Dolly, MD  [facility] Electrophysiologist:  None   Chief Complaint: 2 yr follow up   History of Present Illness:    Melissa Zavala is a 81 y.o. female with a hx of HTN, obesity  and HLD seen for follow up.   Seen in the office 2/21 by Dr. Harl Bowie.  She had had several appointments that were canceled since then.  At that time, she was having some dyspnea on exertion and lower extremity edema.  She was on Lasix as needed which was controlling her swelling.   Seen by Nicholes Rough 12/19/21.  She was having chest pain which felt due to acid reflux.  Also repeated shortness of breath.  Echocardiogram showed LV function of 60 to 65%, moderate LVH and grade 2 diastolic dysfunction.  Here today for follow-up.  Patient reports continues intermittent chest pain at epigastric/lower sternal area since visit.  May have slightly better.  Her dyspnea is improving.  Has stable lower extremity edema.  No orthopnea, PND or melena.  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   Osteoporosis    Vitamin B12 deficiency 12/06/2017    Past Surgical History:  Procedure Laterality Date   TOTAL HIP ARTHROPLASTY Right    TOTAL KNEE ARTHROPLASTY Bilateral    TUBAL LIGATION      Current Medications: Current Meds  Medication Sig   albuterol (VENTOLIN HFA) 108 (90 Base) MCG/ACT inhaler Inhale 2 puffs into the lungs every 6 (six) hours as needed for wheezing or shortness of breath.   alendronate (FOSAMAX) 70 MG tablet Take 1 tablet (70 mg total) by mouth every 7 (seven) days. Take with a full glass of water on an empty stomach.   amLODipine (NORVASC) 10 MG tablet Take 10 mg by mouth daily.   CALCIUM PO Take 1 tablet by mouth daily.   celecoxib (CELEBREX) 200 MG capsule TAKE (1) CAPSULE  DAILY.   esomeprazole (NEXIUM) 40 MG capsule TAKE (1) CAPSULE DAILY.   fluocinonide ointment (LIDEX) 5.32 % Apply 1 Application topically daily as needed. (Patient taking differently: Apply 1 Application topically daily as needed (rash).)   fluticasone (FLONASE) 50 MCG/ACT nasal spray Place 2 sprays into both nostrils daily.   furosemide (LASIX) 20 MG tablet Take 40 mg by mouth daily for 1 week, then 20 mg by mouth as needed for lower extremity swelling (Patient taking differently: Take 20 mg by mouth daily. Take 40 mg by mouth daily for 1 week, then 20 mg by mouth as needed for lower extremity swelling)   HYDROcodone-acetaminophen (NORCO) 5-325 MG tablet Take 1 tablet by mouth every 8 (eight) hours as needed for moderate pain.   [START ON 04/18/2022] HYDROcodone-acetaminophen (NORCO) 5-325 MG tablet Take 1 tablet by mouth every 8 (eight) hours as needed for moderate pain.   levocetirizine (XYZAL) 5 MG tablet Take 1 tablet (5 mg total) by mouth every evening.   lisinopril (ZESTRIL) 40 MG tablet Take 1 tablet (40 mg total) by mouth daily.   nystatin cream (MYCOSTATIN) Apply 1-2x daily for 7 days then as needed   potassium chloride (KLOR-CON) 10 MEQ tablet TAKE ONE TABLET TWICE A DAY.   VITAMIN D PO Take 1 capsule by mouth daily.     Allergies:  Iodinated contrast media, Diphenhydramine, Codeine, Statins, and Zetia [ezetimibe]   Social History   Socioeconomic History   Marital status: Divorced    Spouse name: Not on file   Number of children: 7   Years of education: Not on file   Highest education level: 8th grade  Occupational History   Occupation: Retired  Tobacco Use   Smoking status: Never   Smokeless tobacco: Never  Vaping Use   Vaping Use: Never used  Substance and Sexual Activity   Alcohol use: No   Drug use: No   Sexual activity: Not Currently  Other Topics Concern   Not on file  Social History Narrative   Lives alone - all of her children live nearby   Social  Determinants of Health   Financial Resource Strain: Medium Risk (01/03/2022)   Overall Financial Resource Strain (CARDIA)    Difficulty of Paying Living Expenses: Somewhat hard  Food Insecurity: Food Insecurity Present (01/03/2022)   Hunger Vital Sign    Worried About [place] in the Last Year: Sometimes true    [place] in the Last Year: Sometimes true  Transportation Needs: No Transportation Needs (01/03/2022)   PRAPARE - Hydrologist (Medical): No    Lack of Transportation (Non-Medical): No  Physical Activity: Insufficiently Active (01/03/2022)   Exercise Vital Sign    Days of Exercise per Week: 2 days    Minutes of Exercise per Session: 20 min  Stress: No Stress Concern Present (01/03/2022)   [place]    Feeling of Stress : Only a little  Social Connections: Moderately Integrated (01/03/2022)   Social Connection and Isolation Panel [NHANES]    Frequency of Communication with Friends and Family: More than three times a week    Frequency of Social Gatherings with Friends and Family: More than three times a week    Attends Religious Services: More than 4 times per year    Active Member of [employer] or Organizations: Yes    Attends Music therapist: More than 4 times per year    Marital Status: Divorced     Family History: The patient's family history includes Breast cancer in her mother.    ROS:   Please see the history of present illness.    All other systems reviewed and are negative.   EKGs/Labs/Other Studies Reviewed:    The following studies were reviewed today:  Echo 12/2021 1. Left ventricular ejection fraction, by estimation, is 60 to 65%. The  left ventricle has normal function. The left ventricle has no regional  wall motion abnormalities. There is moderate asymmetric left ventricular  hypertrophy of the basal-septal  segment. Left ventricular  diastolic parameters are consistent with Grade  II diastolic dysfunction (pseudonormalization). Elevated left atrial  pressure.   2. Right ventricular systolic function is normal. The right ventricular  size is normal. There is moderately elevated pulmonary artery systolic  pressure. The estimated right ventricular systolic pressure is 46.2 mmHg.   3. Left atrial size was mildly dilated.   4. Right atrial size was mildly dilated.   5. The mitral valve is degenerative. Trivial mitral valve regurgitation.  No evidence of mitral stenosis.   6. The aortic valve was not well visualized. Aortic valve regurgitation  is not visualized. No aortic stenosis is present.   7. The inferior vena cava is dilated in size with <50% respiratory  variability, suggesting right atrial  pressure of 15 mmHg.   EKG:  EKG is not  ordered today.   Recent Labs: 10/27/2021: BNP 49.8 02/16/2022: ALT 10; BUN 14; Creatinine, Ser 0.71; Hemoglobin 11.5; Platelets 241; Potassium 3.6; Sodium 139; TSH 0.178  Recent Lipid Panel    Component Value Date/Time   CHOL 209 (H) 10/27/2021 1630   TRIG 102 10/27/2021 1630   HDL 88 10/27/2021 1630   CHOLHDL 2.4 10/27/2021 1630   LDLCALC 103 (H) 10/27/2021 1630     Physical Exam:    VS:  BP 138/72   Pulse 68   Ht '5\' 3"'$  (1.6 m)   Wt 205 lb (93 kg)   SpO2 97%   BMI 36.31 kg/m     Wt Readings from Last 3 Encounters:  03/22/22 205 lb (93 kg)  03/08/22 204 lb (92.5 kg)  02/16/22 204 lb (92.5 kg)     GEN:  Well nourished, well developed in no acute distress HEENT: Normal NECK: No JVD; No carotid bruits LYMPHATICS: No lymphadenopathy CARDIAC: RRR, no murmurs, rubs, gallops RESPIRATORY:  Clear to auscultation without rales, wheezing or rhonchi  ABDOMEN: Soft, non-tender, non-distended MUSCULOSKELETAL: Trace edema; No deformity  SKIN: Warm and dry NEUROLOGIC:  Alert and oriented x 3 PSYCHIATRIC:  Normal affect   ASSESSMENT AND PLAN:    Chest pain Sounds atypical of  angina however ongoing.  We will get stress test to rule out ischemia.  Unable to do coronary CT due to dye allergy.   Shared Decision Making/Informed Consent The risks [chest pain, shortness of breath, cardiac arrhythmias, dizziness, blood pressure fluctuations, myocardial infarction, stroke/transient ischemic attack, nausea, vomiting, allergic reaction, radiation exposure, metallic taste sensation and life-threatening complications (estimated to be 1 in 10,000)], benefits (risk stratification, diagnosing coronary artery disease, treatment guidance) and alternatives of a nuclear stress test were discussed in detail with Ms. Judeth Horn and she agrees to proceed.   2.  HFpEF -Echo with preserved LV function and grade 2 diastolic dysfunction. -Reports improved breathing -Continue Lasix and lisinopril  3.  Hypertension -Blood pressure stable on current medication  Medication Adjustments/Labs and Tests Ordered: Current medicines are reviewed at length with the patient today.  Concerns regarding medicines are outlined above.  Orders Placed This Encounter  Procedures   Myocardial Perfusion Imaging   No orders of the defined types were placed in this encounter.   Patient Instructions  Medication Instructions:   Your physician recommends that you continue on your current medications as directed. Please refer to the Current Medication list given to you today.   *If you need a refill on your cardiac medications before your next appointment, please call your pharmacy*   Lab Work:  None ordered.  If you have labs (blood work) drawn today and your tests are completely normal, you will receive your results only by: [place] (if you have MyChart) OR A paper copy in the mail If you have any lab test that is abnormal or we need to change your treatment, we will call you to review the results.   Testing/Procedures:  You are scheduled for a Myocardial Perfusion Imaging Study on Tuesday,  November 14 at 10:00 am.   Please arrive 15 minutes prior to your appointment time for registration and insurance purposes.   The test will take approximately 3 to 4 hours to complete; you may bring reading material. If someone comes with you to your appointment, they will need to remain in the main lobby due to limited space in the testing area.  How to prepare for your Myocardial Perfusion test:   Do not eat or drink 3 hours prior to your test, except you may have water.    Do not consume products containing caffeine (regular or decaffeinated) 12 hours prior to your test (ex: coffee, chocolate, soda, tea)   Do bring a list of your current medications with you. If not listed below, you may take your medications as normal.    Bring any held medication to your appointment, as you may be required to take it once the test is complete.   Do wear comfortable clothes (no dresses or overalls) and walking shoes. Tennis shoes are preferred. No heels or open toed shoes.  Do not wear perfume, or lotions (deodorant is allowed).   If these instructions are not followed, you test will have to be rescheduled.   Please report to [address] for your test. If you have questions or concerns about your appointment, please call the Nuclear Lab at [phone].  If you cannot keep your appointment, please provide 24 hour notification to the Nuclear lab to avoid a possible $50 charge to your account.       Follow-Up: At [facility], you and your health needs are our priority.  As part of our continuing mission to provide you with exceptional heart care, we have created designated Provider Care Teams.  These Care Teams include your primary Cardiologist (physician) and Advanced Practice Providers (APPs -  Physician Assistants and Nurse Practitioners) who all work together to provide you with the care you need, when you need it.  We recommend signing up for the patient  portal called "MyChart".  Sign up information is provided on this After Visit Summary.  MyChart is used to connect with patients for Virtual Visits (Telemedicine).  Patients are able to view lab/test results, encounter notes, upcoming appointments, etc.  Non-urgent messages can be sent to your provider as well.   To learn more about what you can do with MyChart, go to [email or web address].    Your next appointment:   4 week(s)  The format for your next appointment:   In Person  Provider:   Nicholes Rough, PA-C          Important Information About Sugar         Jarrett Soho, [place]  03/22/2022 2:38 PM    [place]

## 2022-03-22 NOTE — Patient Instructions (Signed)
Medication Instructions:   Your physician recommends that you continue on your current medications as directed. Please refer to the Current Medication list given to you today.   *If you need a refill on your cardiac medications before your next appointment, please call your pharmacy*   Lab Work:  None ordered.  If you have labs (blood work) drawn today and your tests are completely normal, you will receive your results only by: [place] (if you have MyChart) OR A paper copy in the mail If you have any lab test that is abnormal or we need to change your treatment, we will call you to review the results.   Testing/Procedures:  You are scheduled for a Myocardial Perfusion Imaging Study on Tuesday, November 14 at 10:00 am.   Please arrive 15 minutes prior to your appointment time for registration and insurance purposes.   The test will take approximately 3 to 4 hours to complete; you may bring reading material. If someone comes with you to your appointment, they will need to remain in the main lobby due to limited space in the testing area.    How to prepare for your Myocardial Perfusion test:   Do not eat or drink 3 hours prior to your test, except you may have water.    Do not consume products containing caffeine (regular or decaffeinated) 12 hours prior to your test (ex: coffee, chocolate, soda, tea)   Do bring a list of your current medications with you. If not listed below, you may take your medications as normal.    Bring any held medication to your appointment, as you may be required to take it once the test is complete.   Do wear comfortable clothes (no dresses or overalls) and walking shoes. Tennis shoes are preferred. No heels or open toed shoes.  Do not wear perfume, or lotions (deodorant is allowed).   If these instructions are not followed, you test will have to be rescheduled.   Please report to [address] for your test. If you have  questions or concerns about your appointment, please call the Nuclear Lab at [phone].  If you cannot keep your appointment, please provide 24 hour notification to the Nuclear lab to avoid a possible $50 charge to your account.       Follow-Up: At [facility], you and your health needs are our priority.  As part of our continuing mission to provide you with exceptional heart care, we have created designated Provider Care Teams.  These Care Teams include your primary Cardiologist (physician) and Advanced Practice Providers (APPs -  Physician Assistants and Nurse Practitioners) who all work together to provide you with the care you need, when you need it.  We recommend signing up for the patient portal called "MyChart".  Sign up information is provided on this After Visit Summary.  MyChart is used to connect with patients for Virtual Visits (Telemedicine).  Patients are able to view lab/test results, encounter notes, upcoming appointments, etc.  Non-urgent messages can be sent to your provider as well.   To learn more about what you can do with MyChart, go to [email or web address].    Your next appointment:   4 week(s)  The format for your next appointment:   In Person  Provider:   Nicholes Rough, PA-C          Important Information About Sugar

## 2022-03-23 ENCOUNTER — Telehealth (HOSPITAL_COMMUNITY): Payer: Self-pay

## 2022-03-23 ENCOUNTER — Other Ambulatory Visit: Payer: Self-pay | Admitting: Family Medicine

## 2022-03-23 DIAGNOSIS — R7989 Other specified abnormal findings of blood chemistry: Secondary | ICD-10-CM

## 2022-03-23 NOTE — Telephone Encounter (Signed)
Detailed instructions left on the patient's answering machine. Asked to call back with any questions. S.Ajanae Virag EMTP 

## 2022-03-24 ENCOUNTER — Other Ambulatory Visit: Payer: Medicare Other

## 2022-03-24 DIAGNOSIS — R6889 Other general symptoms and signs: Secondary | ICD-10-CM | POA: Diagnosis not present

## 2022-03-24 DIAGNOSIS — R7989 Other specified abnormal findings of blood chemistry: Secondary | ICD-10-CM | POA: Diagnosis not present

## 2022-03-25 LAB — T4, FREE: Free T4: 1.58 ng/dL (ref 0.82–1.77)

## 2022-03-25 LAB — T3, FREE: T3, Free: 3.2 pg/mL (ref 2.0–4.4)

## 2022-03-25 LAB — TSH: TSH: 0.13 u[IU]/mL — ABNORMAL LOW (ref 0.450–4.500)

## 2022-03-28 ENCOUNTER — Other Ambulatory Visit: Payer: Self-pay

## 2022-03-28 ENCOUNTER — Ambulatory Visit (HOSPITAL_COMMUNITY): Payer: Medicare Other | Attending: Physician Assistant

## 2022-03-28 DIAGNOSIS — R079 Chest pain, unspecified: Secondary | ICD-10-CM | POA: Diagnosis not present

## 2022-03-28 DIAGNOSIS — I340 Nonrheumatic mitral (valve) insufficiency: Secondary | ICD-10-CM | POA: Insufficient documentation

## 2022-03-28 DIAGNOSIS — R0609 Other forms of dyspnea: Secondary | ICD-10-CM | POA: Diagnosis not present

## 2022-03-28 DIAGNOSIS — R609 Edema, unspecified: Secondary | ICD-10-CM | POA: Insufficient documentation

## 2022-03-28 LAB — MYOCARDIAL PERFUSION IMAGING
LV dias vol: 76 mL (ref 46–106)
LV sys vol: 24 mL
Nuc Stress EF: 69 %
Peak HR: 95 {beats}/min
Rest HR: 64 {beats}/min
Rest Nuclear Isotope Dose: 10.7 mCi
SDS: 2
SRS: 0
SSS: 2
ST Depression (mm): 0 mm
Stress Nuclear Isotope Dose: 30.9 mCi
TID: 1.03

## 2022-03-28 MED ORDER — TECHNETIUM TC 99M TETROFOSMIN IV KIT
30.9000 | PACK | Freq: Once | INTRAVENOUS | Status: AC | PRN
  Administered 2022-03-28: 30.9 via INTRAVENOUS

## 2022-03-28 MED ORDER — REGADENOSON 0.4 MG/5ML IV SOLN
0.4000 mg | Freq: Once | INTRAVENOUS | Status: AC
  Administered 2022-03-28: 0.4 mg via INTRAVENOUS

## 2022-03-28 MED ORDER — ISOSORBIDE MONONITRATE ER 30 MG PO TB24: 15.0000 mg | ORAL_TABLET | Freq: Every day | ORAL | 1 refills | Status: DC

## 2022-03-28 MED ORDER — TECHNETIUM TC 99M TETROFOSMIN IV KIT
10.7000 | PACK | Freq: Once | INTRAVENOUS | Status: AC | PRN
  Administered 2022-03-28: 10.7 via INTRAVENOUS

## 2022-03-29 ENCOUNTER — Other Ambulatory Visit: Payer: Self-pay | Admitting: Family Medicine

## 2022-03-29 DIAGNOSIS — R7989 Other specified abnormal findings of blood chemistry: Secondary | ICD-10-CM

## 2022-03-29 IMAGING — DX DG SHOULDER 2+V*L*
3 series · 3 of 3 positions shown · non-contrast
Comparison: July 18, 2016

CLINICAL DATA: Pain

EXAM:
LEFT SHOULDER - 2+ VIEW

[shoulder ap (1 of 3)]
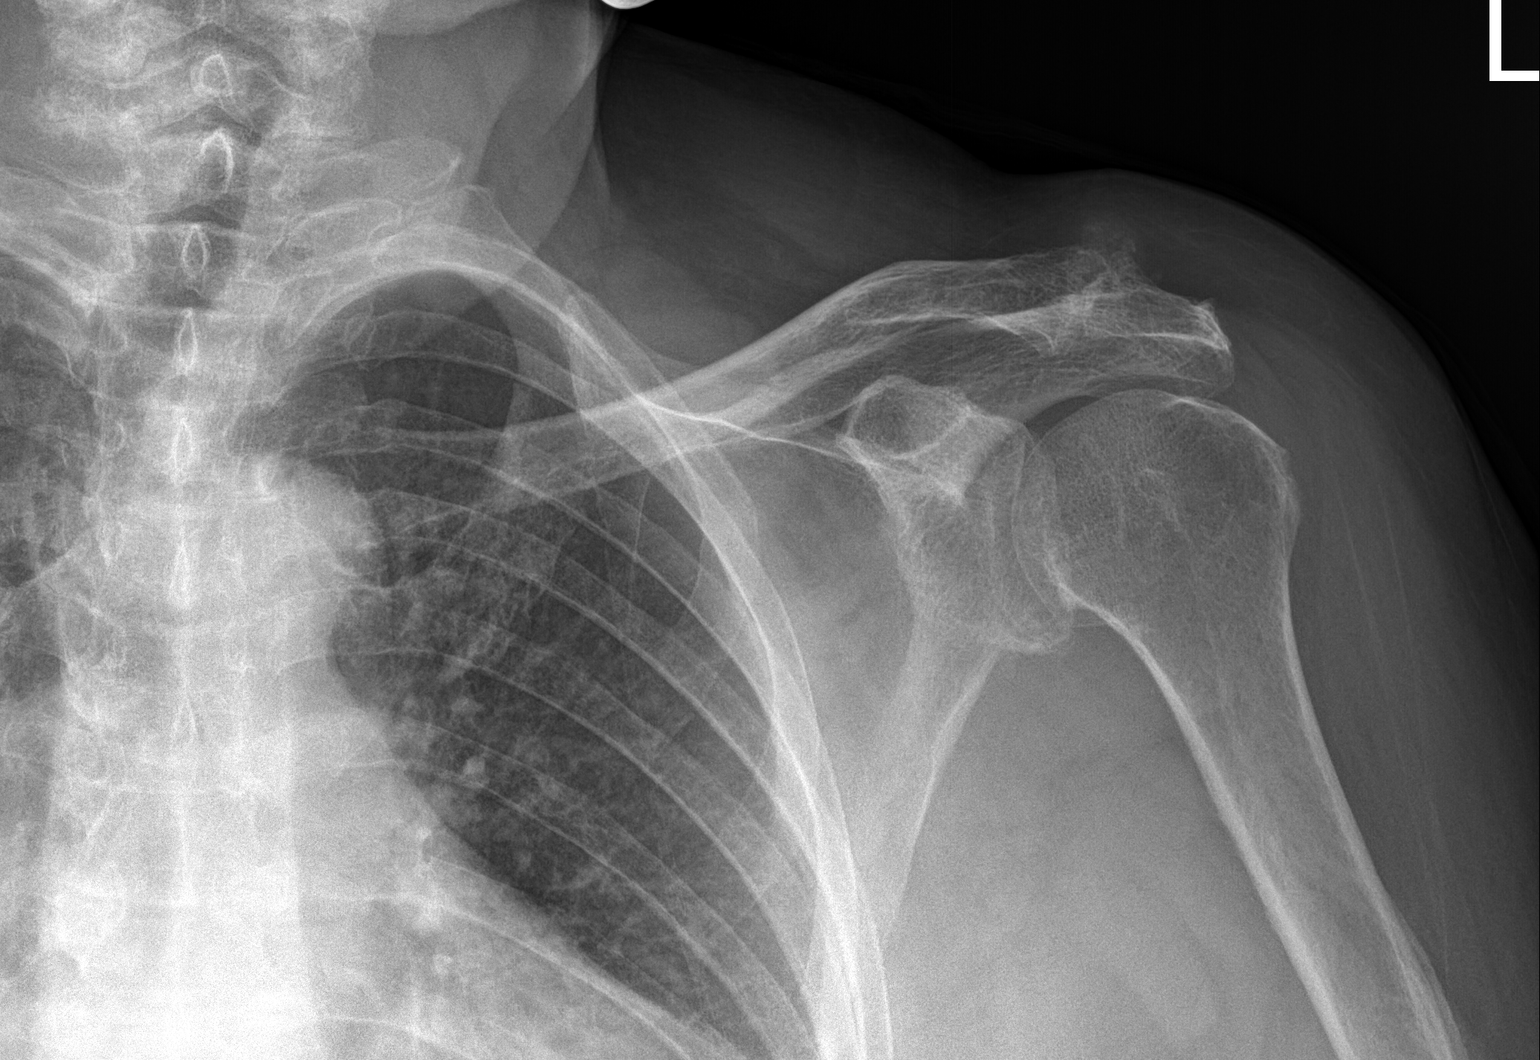

[shoulder ap (2 of 3)]
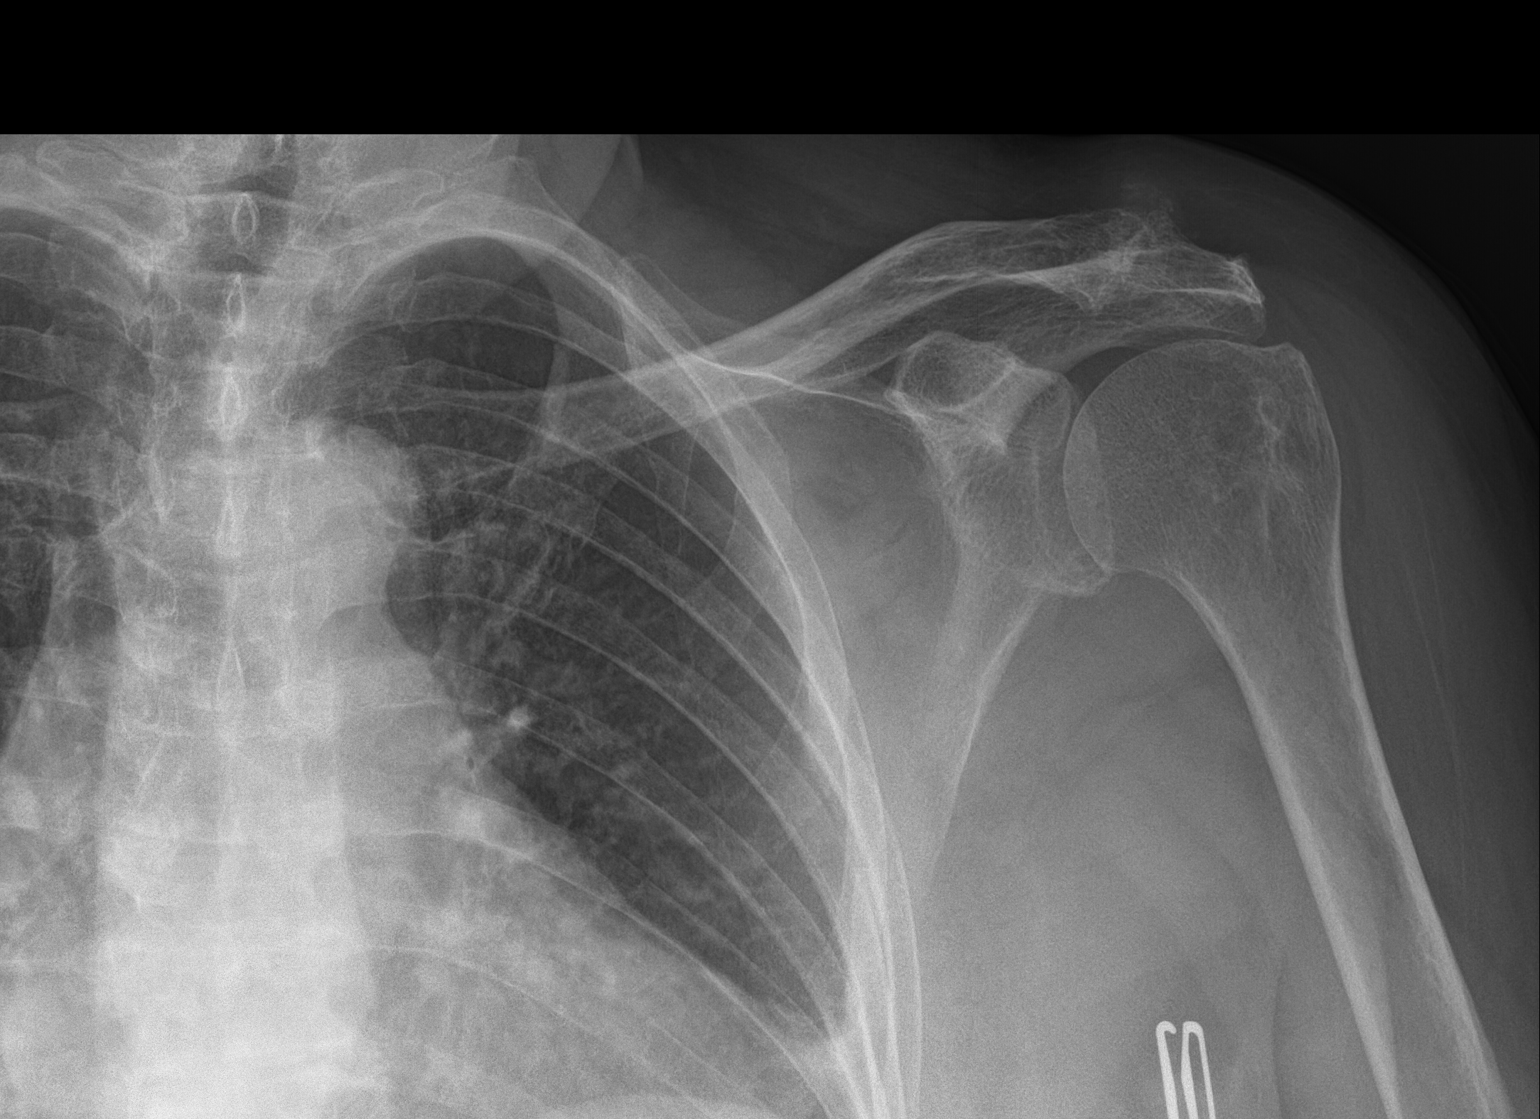

[shoulder ap (3 of 3)]
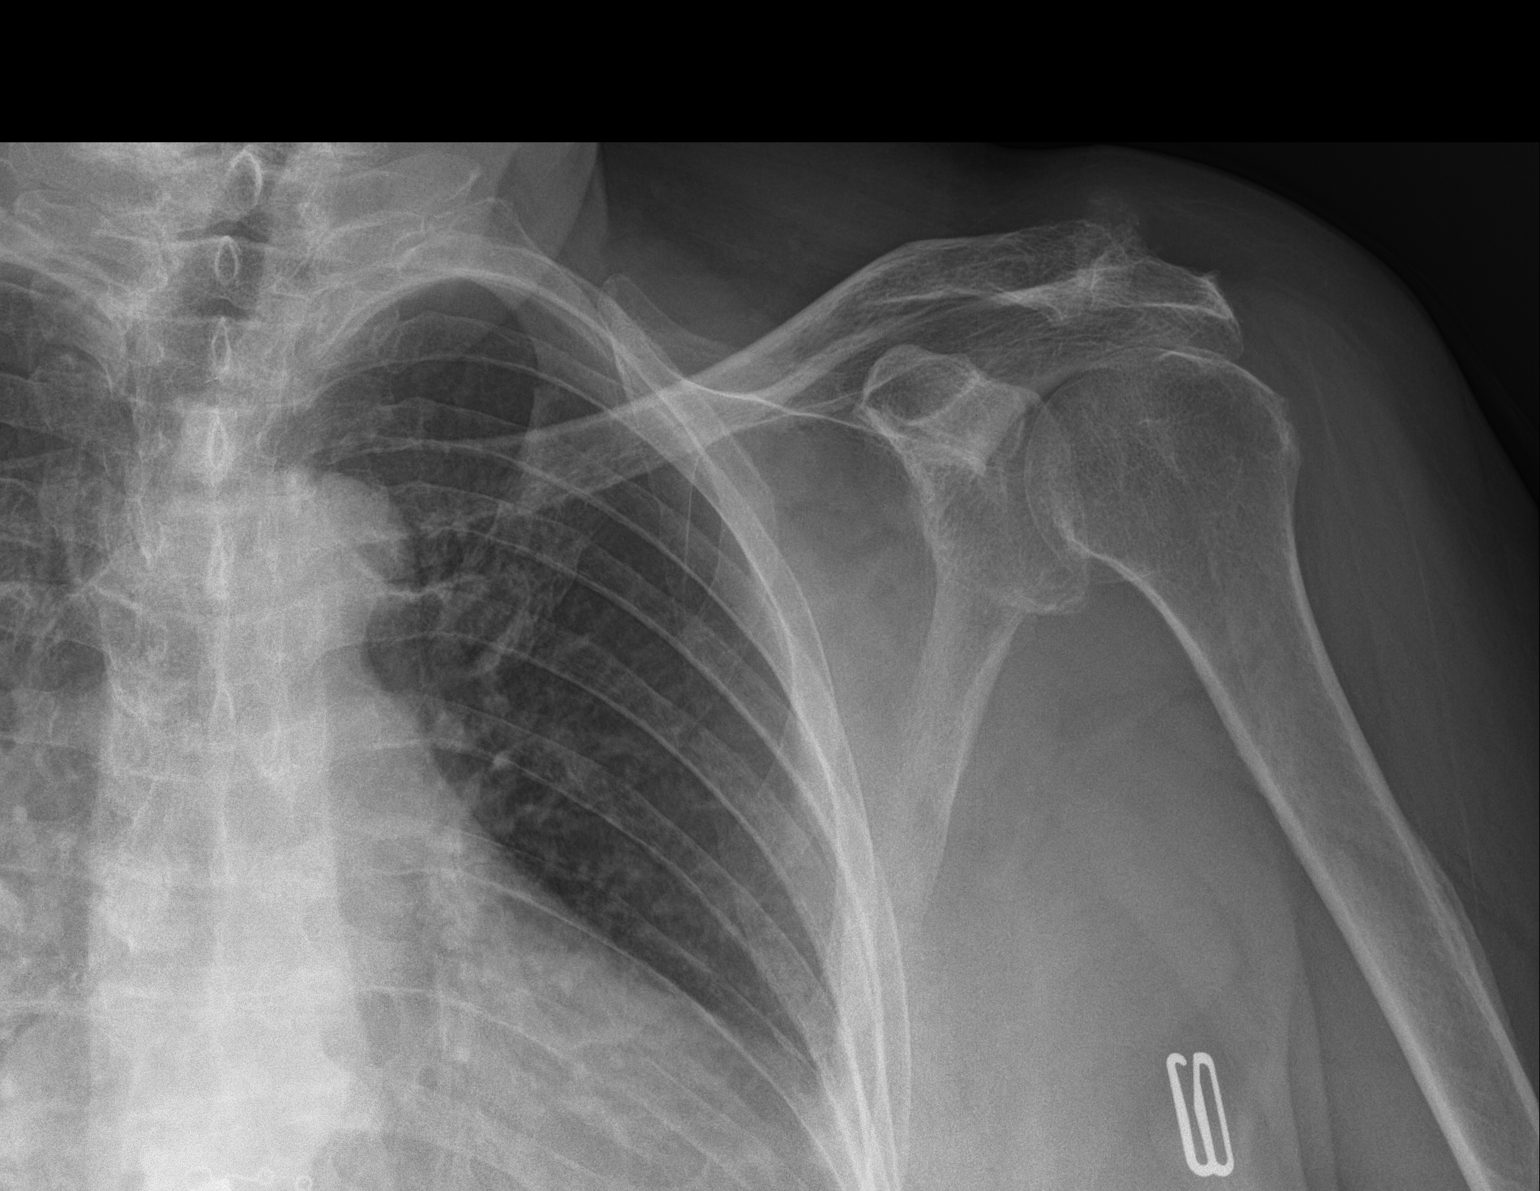

[3 of 3 positions shown; findings below may reference images not displayed]

FINDINGS: No acute fracture or dislocation. Severe degenerative changes of the
acromioclavicular joint with subacromial spurring. No area of
erosion or osseous destruction. No unexpected radiopaque foreign
body. Soft tissues are unremarkable.
IMPRESSION: No acute findings. Severe degenerative changes of the left
acromioclavicular joint with subacromial spurring.

## 2022-04-10 ENCOUNTER — Telehealth: Payer: Self-pay | Admitting: Cardiology

## 2022-04-10 NOTE — Telephone Encounter (Signed)
Returned call to patient and let her know we do not do X-rays here but she can talk to provider and they may order for her but felt best to call PCP.

## 2022-04-10 NOTE — Progress Notes (Unsigned)
Office Visit    Patient Name: Melissa Zavala Date of Encounter: 04/11/2022  PCP:  Gwenlyn Perking, [place]  Cardiologist:  Carlyle Dolly, MD  Advanced Practice Provider:  No care team member to display Electrophysiologist:  None   HPI    Melissa Zavala is a 81 y.o. female with a past medical history significant for lower extremity edema, arthritis, chronic pain, GERD, hypertension presents today for follow-up.  She was last seen in the office 2/21 by Dr. Harl Bowie.  She had had several appointments that were canceled since then.  At that time, she was having some dyspnea on exertion and lower extremity edema.  She was on Lasix as needed which was controlling her swelling.  She was recently in the ED 11/27/2021 and was having chest pain and left flank pain.  She had a CT scan to evaluate her kidneys which showed a questionable right adrenal mass and left adnexal mass.  She also had some hypertension while in the hospital.  Troponin was 10.  It does not appear like it was trended.  No significant EKG findings.  Referred back to cardiology for work-up of chest pain.  She was last seen in the clinic 12/19/21 by myself. At that time she shared that she is having some lower extremity edema.  She was recently in the hospital because she was feeling weak and she started to have chest and flank pain.  She states her chest pain came on after eating a banana and after she took a heartburn pill it got better.  She had some imaging done in the hospital which we reviewed.  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  I will leave this up to her primary to workup.  We have discussed increasing her Lasix for fluid overload and wearing compression hose/socks.  Luckily, no further chest pains.  Today, she has some questions about her Imdur medication. She misunderstood and thought this medication was for anxiety.  I explained the mechanism of action and encouraged her to  take this medication.  We also discussed the results of her stress test.  BP was slightly elevated today but was better controlled than it had been in the past.  She is still having issues with lower extremity edema.  She has as needed Lasix but she does not like to take it.  She did have a fall recently and hit her right rib cage.  She has some soreness and bruising there.  Otherwise, doing well from a cardiac standpoint  Reports no shortness of breath nor dyspnea on exertion. Reports no chest pain, pressure, or tightness. No edema, orthopnea, PND. Reports no palpitations.    Past Medical History    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   Osteoporosis    Vitamin B12 deficiency 12/06/2017   Past Surgical History:  Procedure Laterality Date   TOTAL HIP ARTHROPLASTY Right    TOTAL KNEE ARTHROPLASTY Bilateral    TUBAL LIGATION      Allergies  Allergies  Allergen Reactions   Iodinated Contrast Media Anaphylaxis   Diphenhydramine Other (See Comments)    Patient states "feels like somebody is inside of me clawing me out"   Codeine Other (See Comments)    Hallucinations   Statins Other (See Comments)    Joint pains and weakness. Joint pains and weakness. Joint pains and  weakness.   Zetia [Ezetimibe] Other (See Comments)    Aching in arms and legs.    EKGs/Labs/Other Studies Reviewed:   The following studies were reviewed today:   EKG:  EKG is not  ordered today.   Recent Labs: 10/27/2021: BNP 49.8 02/16/2022: ALT 10; BUN 14; Creatinine, Ser 0.71; Hemoglobin 11.5; Platelets 241; Potassium 3.6; Sodium 139 03/24/2022: TSH 0.130  Recent Lipid Panel    Component Value Date/Time   CHOL 209 (H) 10/27/2021 1630   TRIG 102 10/27/2021 1630   HDL 88 10/27/2021 1630   CHOLHDL 2.4 10/27/2021 1630   LDLCALC 103 (H) 10/27/2021 1630   Home Medications   Current Meds  Medication Sig   albuterol (VENTOLIN HFA) 108 (90  Base) MCG/ACT inhaler Inhale 2 puffs into the lungs every 6 (six) hours as needed for wheezing or shortness of breath.   alendronate (FOSAMAX) 70 MG tablet Take 1 tablet (70 mg total) by mouth every 7 (seven) days. Take with a full glass of water on an empty stomach.   amLODipine (NORVASC) 10 MG tablet Take 10 mg by mouth daily.   CALCIUM PO Take 1 tablet by mouth daily.   celecoxib (CELEBREX) 200 MG capsule TAKE (1) CAPSULE DAILY.   esomeprazole (NEXIUM) 40 MG capsule TAKE (1) CAPSULE DAILY.   fluocinonide ointment (LIDEX) 7.67 % Apply 1 Application topically daily as needed. (Patient taking differently: Apply 1 Application topically daily as needed (rash).)   fluticasone (FLONASE) 50 MCG/ACT nasal spray Place 2 sprays into both nostrils daily.   furosemide (LASIX) 20 MG tablet Take 40 mg by mouth daily for 1 week, then 20 mg by mouth as needed for lower extremity swelling (Patient taking differently: Take 20 mg by mouth daily. Take 40 mg by mouth daily for 1 week, then 20 mg by mouth as needed for lower extremity swelling)   HYDROcodone-acetaminophen (NORCO) 5-325 MG tablet Take 1 tablet by mouth every 8 (eight) hours as needed for moderate pain.   [START ON 04/18/2022] HYDROcodone-acetaminophen (NORCO) 5-325 MG tablet Take 1 tablet by mouth every 8 (eight) hours as needed for moderate pain.   levocetirizine (XYZAL) 5 MG tablet Take 1 tablet (5 mg total) by mouth every evening.   lisinopril (ZESTRIL) 40 MG tablet Take 1 tablet (40 mg total) by mouth daily.   nystatin cream (MYCOSTATIN) Apply 1-2x daily for 7 days then as needed   potassium chloride (KLOR-CON) 10 MEQ tablet TAKE ONE TABLET TWICE A DAY.   VITAMIN D PO Take 1 capsule by mouth daily.     Review of Systems      All other systems reviewed and are otherwise negative except as noted above.  Physical Exam    VS:  BP (!) 144/72   Pulse 79   Ht '5\' 3"'$  (1.6 m)   Wt 207 lb 9.6 oz (94.2 kg)   SpO2 98%   BMI 36.77 kg/m  , BMI Body  mass index is 36.77 kg/m.  Wt Readings from Last 3 Encounters:  04/11/22 207 lb 9.6 oz (94.2 kg)  03/28/22 205 lb (93 kg)  03/22/22 205 lb (93 kg)     GEN: Well nourished, well developed, in no acute distress. HEENT: normal. Neck: Supple, no JVD, carotid bruits, or masses. Cardiac: RRR, 3/6 systolic murmur, rubs, or gallops. No clubbing, cyanosis, 1-2+ pitting edema.  Radials/PT 2+ and equal bilaterally.  Respiratory:  Respirations regular and unlabored, clear to auscultation bilaterally. GI: Soft, nontender, nondistended. MS: No deformity or atrophy.  Skin: Warm and dry, no rash. Neuro:  Strength and sensation are intact. Psych: Normal affect.  Assessment & Plan    Recent mechanical fall -Recommended Voltaren gel to use over the rib cage -Ordered a chest x-ray today and will send this note to her primary to follow-up on  Chest pain This has resolved.  Thought to be due to acid reflux.  Patient had a banana and then took a heartburn pill and the pain got better. -Troponin flat in the hospital and no significant EKG changes -Lexiscan myoview reviewed with the patient and all questions answered -Encouraged her to take her Imdur 15 mg daily  SOB/DOE -Ms. Liska is fluid overloaded on exam.  She has some pitting edema present in her lower extremity. -We have encouraged her to take 20 mg of Lasix for the next 3 days and then as needed thereafter for lower extremity edema -We have asked her to take her weight daily in the morning and keep track.  If she gains more than 3 pounds overnight or 5 pounds in a week she is to let us know. -Recommended lower extremity compression hose/socks.  We have provided her with information on the elastic therapy in [place] and a new prescription -Continue a low-sodium diet -We have decreased her Norvasc from 10 mg down to 5 mg and encouraged her to take her blood pressure regularly (she takes an extra Norvasc when her BP is elevated)  Heart  murmur -Last echocardiogram 12/2021 showed mild mitral regurgitation, normal systolic function, and mild diastolic dysfunction (grade 2) -continue current medication regimen  Hypertension -Amlodipine '10mg'$  daily (reduced to '5mg'$  due to LE edema), and Lisinopril '40mg'$   -Imdur recently added  Hyperlipidemia -LDL 63, at goal -Intolerant to any statin medications and also has an allergy to Zetia -per primary    Disposition: Follow up 3 months with Carlyle Dolly, MD or APP.  Signed, Elgie Collard, PA-C 04/11/2022, 12:42 PM [facility]

## 2022-04-10 NOTE — Telephone Encounter (Signed)
Pt states she fell yesterday and now has pain under her right breast and was wondering if she could get an xray while at her appt tomorrow. Requesting call back to discuss.

## 2022-04-11 ENCOUNTER — Telehealth: Payer: Self-pay | Admitting: Family Medicine

## 2022-04-11 ENCOUNTER — Encounter: Payer: Self-pay | Admitting: Physician Assistant

## 2022-04-11 ENCOUNTER — Ambulatory Visit
Admission: RE | Admit: 2022-04-11 | Discharge: 2022-04-11 | Disposition: A | Discharge status: Home / Self Care | Payer: Medicare Other | Source: Ambulatory Visit | Attending: Physician Assistant | Admitting: Physician Assistant

## 2022-04-11 ENCOUNTER — Ambulatory Visit: Payer: Medicare Other | Attending: Physician Assistant | Admitting: Physician Assistant

## 2022-04-11 VITALS — BP 144/72 | HR 79 | Ht 63.0 in | Wt 207.6 lb

## 2022-04-11 DIAGNOSIS — W19XXXA Unspecified fall, initial encounter: Secondary | ICD-10-CM

## 2022-04-11 DIAGNOSIS — I10 Essential (primary) hypertension: Secondary | ICD-10-CM

## 2022-04-11 DIAGNOSIS — I340 Nonrheumatic mitral (valve) insufficiency: Secondary | ICD-10-CM

## 2022-04-11 DIAGNOSIS — R0609 Other forms of dyspnea: Secondary | ICD-10-CM | POA: Diagnosis not present

## 2022-04-11 DIAGNOSIS — R011 Cardiac murmur, unspecified: Secondary | ICD-10-CM | POA: Diagnosis not present

## 2022-04-11 DIAGNOSIS — E782 Mixed hyperlipidemia: Secondary | ICD-10-CM

## 2022-04-11 DIAGNOSIS — Z043 Encounter for examination and observation following other accident: Secondary | ICD-10-CM | POA: Diagnosis not present

## 2022-04-11 DIAGNOSIS — R079 Chest pain, unspecified: Secondary | ICD-10-CM | POA: Diagnosis not present

## 2022-04-11 DIAGNOSIS — R609 Edema, unspecified: Secondary | ICD-10-CM | POA: Diagnosis not present

## 2022-04-11 NOTE — Patient Instructions (Signed)
Medication Instructions:  1.Use over the counter voltaren gel liberally to the area as needed 2.Be sure to take your imdur 15 mg daily 3.Take lasix 20 mg daily for the next 3 days *If you need a refill on your cardiac medications before your next appointment, please call your pharmacy*   Lab Work: None If you have labs (blood work) drawn today and your tests are completely normal, you will receive your results only by: [place] (if you have MyChart) OR A paper copy in the mail If you have any lab test that is abnormal or we need to change your treatment, we will call you to review the results.   Testing/Procedures: A chest x-ray takes a picture of the organs and structures inside the chest, including the heart, lungs, and blood vessels. This test can show several things, including, whether the heart is enlarges; whether fluid is building up in the lungs; and whether pacemaker / defibrillator leads are still in place. Chest X-ray Instructions:    1. You may have this done at the [facility], located in the [place] on the 1st floor.    2. You do no have to have an appointment.    3. [place], [place] [address]        [phone]        Monday - Friday  8:00 am - 5:00 pm   Follow-Up: At [facility], you and your health needs are our priority.  As part of our continuing mission to provide you with exceptional heart care, we have created designated Provider Care Teams.  These Care Teams include your primary Cardiologist (physician) and Advanced Practice Providers (APPs -  Physician Assistants and Nurse Practitioners) who all work together to provide you with the care you need, when you need it.  Your next appointment:   3 month(s)  The format for your next appointment:   In Person  Provider:   Dr Harl Bowie or Nicholes Rough, PA-C  Other Instructions 1.Weigh yourself every morning after using the restroom, before  breakfast and keep a log of your weights 2.Check blood pressure daily, one hour after taking your morning medications for the next 2 weeks, keep a log and send [place] the readings  Important Information About Sugar

## 2022-04-12 NOTE — Telephone Encounter (Signed)
Pt had a fall and cardiology had ordered a chest xray to check for broken ribs. Pt would like an appt to discuss as she is still in pain. Pt scheduled on DOD schedule tomorrow with [name] at 11:20.

## 2022-04-13 ENCOUNTER — Ambulatory Visit (INDEPENDENT_AMBULATORY_CARE_PROVIDER_SITE_OTHER): Payer: Medicare Other | Admitting: Family Medicine

## 2022-04-13 ENCOUNTER — Encounter: Payer: Self-pay | Admitting: Family Medicine

## 2022-04-13 VITALS — BP 140/76 | HR 88 | Temp 98.1°F | Ht 63.0 in | Wt 203.2 lb

## 2022-04-13 DIAGNOSIS — W19XXXD Unspecified fall, subsequent encounter: Secondary | ICD-10-CM

## 2022-04-13 DIAGNOSIS — R0781 Pleurodynia: Secondary | ICD-10-CM

## 2022-04-13 DIAGNOSIS — I10 Essential (primary) hypertension: Secondary | ICD-10-CM

## 2022-04-13 NOTE — Patient Instructions (Signed)
Chest Wall Pain Chest wall pain is pain in or around the bones and muscles of your chest. Sometimes, an injury causes this pain. Excessive coughing or overuse of arm and chest muscles may also cause chest wall pain. Sometimes, the cause may not be known. This pain may take several weeks or longer to get better. Follow these instructions at home: Managing pain, stiffness, and swelling  If directed, put ice on the painful area: Put ice in a plastic bag. Place a towel between your skin and the bag. Leave the ice on for 20 minutes, 2-3 times per day. Activity Rest as told by your health care provider. Avoid activities that cause pain. These include any activities that use your chest muscles or your abdominal and side muscles to lift heavy items. Ask your health care provider what activities are safe for you. General instructions  Take over-the-counter and prescription medicines only as told by your health care provider. Do not use any products that contain nicotine or tobacco, such as cigarettes, e-cigarettes, and chewing tobacco. These can delay healing after injury. If you need help quitting, ask your health care provider. Keep all follow-up visits as told by your health care provider. This is important. Contact a health care provider if: You have a fever. Your chest pain becomes worse. You have new symptoms. Get help right away if: You have nausea or vomiting. You feel sweaty or light-headed. You have a cough with mucus from your lungs (sputum) or you cough up blood. You develop shortness of breath. These symptoms may represent a serious problem that is an emergency. Do not wait to see if the symptoms will go away. Get medical help right away. Call your local emergency services (911 in the [place] Do not drive yourself to the hospital. Summary Chest wall pain is pain in or around the bones and muscles of your chest. Depending on the cause, it may be treated with ice, rest, medicines, and  avoiding activities that cause pain. Contact a health care provider if you have a fever, worsening chest pain, or new symptoms. Get help right away if you feel light-headed or you develop shortness of breath. These symptoms may be an emergency. This information is not intended to replace advice given to you by your health care provider. Make sure you discuss any questions you have with your health care provider. Document Revised: 07/16/2020 Document Reviewed: 07/16/2020 Elsevier Patient Education  2023 [employer]vier Inc.  

## 2022-04-13 NOTE — Progress Notes (Signed)
   Acute Office Visit  Subjective:     Patient ID: Melissa Zavala, female    DOB: [birth year], 81 y.o.   MRN: [id number]  Chief Complaint  Patient presents with   Fall    Fall   Patient is in today for a fall that occurred 4 days ago. She tripped over a rug and landed on her right side. She has had pain in her ribs on the right side since the fall. She saw her cardiology on 04/11/22 and had a CXR that was negative for acute findings or fractures. She reports sharp pain on the right side that occurs with certain movements, deep breathing, coughing, or sneezing. Reports pain is 7-8 at its worse. At rest the pain is minimal. She has been bracing her side with activity. She has been taking his norco prn. She denies fever, shortness of breath, or productive cough.   ROS As per HPI.      Objective:    BP (!) 140/76   Pulse 88   Temp 98.1 F (36.7 C) (Temporal)   Ht '5\' 3"'$  (1.6 m)   Wt 203 lb 4 oz (92.2 kg)   SpO2 96%   BMI 36.00 kg/m    Physical Exam Vitals and nursing note reviewed.  Constitutional:      General: She is not in acute distress.    Appearance: She is not ill-appearing, toxic-appearing or diaphoretic.  Cardiovascular:     Rate and Rhythm: Normal rate and regular rhythm.     Heart sounds: Normal heart sounds. No murmur heard. Pulmonary:     Effort: Pulmonary effort is normal. No respiratory distress.     Breath sounds: Normal breath sounds. No wheezing, rhonchi or rales.  Chest:     Chest wall: Tenderness (tenderness to palpation of right axillary line around 6th-7th rib. No crepitus) present.  Neurological:     General: No focal deficit present.     Mental Status: She is alert and oriented to person, place, and time.  Psychiatric:        Mood and Affect: Mood normal.        Behavior: Behavior normal.     No results found for any visits on 04/13/22.      Assessment & Plan:   Melissa Zavala was seen today for fall.  Diagnoses and all orders for this  visit:  Fall, subsequent encounter Rib pain on right side Has had a negative CXR. Lung clear on exam today. Discussed to continue norco prn, voltaren gel, and continue bracing as needed. Continue heating pad prn. Discussed importance of deep breathing and return precautions.   Hypertension BP at goal today.  The patient indicates understanding of these issues and agrees with the plan.  Gwenlyn Perking, FNP

## 2022-04-23 ENCOUNTER — Other Ambulatory Visit: Payer: Self-pay | Admitting: Family Medicine

## 2022-04-23 DIAGNOSIS — J302 Other seasonal allergic rhinitis: Secondary | ICD-10-CM

## 2022-04-23 DIAGNOSIS — R609 Edema, unspecified: Secondary | ICD-10-CM

## 2022-04-23 DIAGNOSIS — I10 Essential (primary) hypertension: Secondary | ICD-10-CM

## 2022-05-22 ENCOUNTER — Ambulatory Visit: Payer: Medicare Other | Admitting: Family Medicine

## 2022-05-28 ENCOUNTER — Other Ambulatory Visit: Payer: Self-pay | Admitting: Family Medicine

## 2022-05-28 DIAGNOSIS — M810 Age-related osteoporosis without current pathological fracture: Secondary | ICD-10-CM

## 2022-06-29 ENCOUNTER — Ambulatory Visit (INDEPENDENT_AMBULATORY_CARE_PROVIDER_SITE_OTHER): Payer: 59 | Admitting: Family Medicine

## 2022-06-29 ENCOUNTER — Encounter: Payer: Self-pay | Admitting: Family Medicine

## 2022-06-29 VITALS — BP 129/64 | HR 77 | Temp 98.4°F | Ht 63.0 in | Wt 200.5 lb

## 2022-06-29 DIAGNOSIS — E782 Mixed hyperlipidemia: Secondary | ICD-10-CM

## 2022-06-29 DIAGNOSIS — Z79899 Other long term (current) drug therapy: Secondary | ICD-10-CM

## 2022-06-29 DIAGNOSIS — M5136 Other intervertebral disc degeneration, lumbar region: Secondary | ICD-10-CM | POA: Insufficient documentation

## 2022-06-29 DIAGNOSIS — Z7409 Other reduced mobility: Secondary | ICD-10-CM

## 2022-06-29 DIAGNOSIS — I10 Essential (primary) hypertension: Secondary | ICD-10-CM | POA: Diagnosis not present

## 2022-06-29 DIAGNOSIS — K219 Gastro-esophageal reflux disease without esophagitis: Secondary | ICD-10-CM

## 2022-06-29 DIAGNOSIS — M159 Polyosteoarthritis, unspecified: Secondary | ICD-10-CM | POA: Diagnosis not present

## 2022-06-29 DIAGNOSIS — R7989 Other specified abnormal findings of blood chemistry: Secondary | ICD-10-CM | POA: Diagnosis not present

## 2022-06-29 MED ORDER — HYDROCODONE-ACETAMINOPHEN 5-325 MG PO TABS: 1.0000 | ORAL_TABLET | Freq: Three times a day (TID) | ORAL | 0 refills | Status: DC | PRN

## 2022-06-29 NOTE — Progress Notes (Signed)
Established Patient Office Visit  Subjective   Patient ID: Melissa Zavala, female    DOB: Jul 10, 1940  Age: 82 y.o. MRN: [id number]  Chief Complaint  Patient presents with   Medical Management of Chronic Issues   Hypertension    HPI Melissa Zavala is here for a chronic follow up today.   HTN Complaint with meds - Yes Current Medications - amlodipine 5 mg, lasix 20 mg prn, lisinopril 40 mg,  Checking BP at home ranging 140s/60s Pertinent ROS:  Headache - No Fatigue - No Visual Disturbances - No Chest pain - No Dyspnea - No Palpitations - No LE edema - mild, baseline They report good compliance with medications and can restate their regimen by memory. No medication side effects.  2. GERD Reports well controlled with medication  3. Pain assessment: Cause of pain- arthritis Pain location- legs & back Pain on scale of 1-10- 10/10 without medication; 5/10 with medication Frequency- comes and goes What increases pain- prolonged standing What makes pain better- rest and medication Effects on ADL- difficulty completing yard and house work Any change in general medical condition- none   Current opioids rx- Norco 5/325 mg Q8H PRN # meds rx- 90 Effectiveness of current meds- effective Adverse reactions form pain meds- none Morphine equivalent- 15 MME/day   Pill count performed-No Last drug screen - 04/28/2021 ( high risk q42m moderate risk q656mlow risk yearly ) Urine drug screen today- No Was the [place]- Yes             If yes were their any concerning findings? - No   Overdose risk: 160  Pain contract signed on: 02/16/22  4. Impaired balance/mobility.  She would like a referral back to PT as she has needed to use her cane mor often. She feels unsteady on feet. She had a fall in December because of this.    ROS Negative unless specially indicated above in HPI.   Objective:     BP 129/64   Pulse 77   Temp 98.4 F (36.9 C) (Temporal)   Ht 5' 3"$  (1.6 m)   Wt 200  lb 8 oz (90.9 kg)   SpO2 97%   BMI 35.52 kg/m  BP Readings from Last 3 Encounters:  06/29/22 129/64  04/13/22 (!) 140/76  04/11/22 (!) 144/72      Physical Exam Vitals and nursing note reviewed.  Constitutional:      General: She is not in acute distress.    Appearance: She is not ill-appearing, toxic-appearing or diaphoretic.  HENT:     Nose: Nose normal.     Mouth/Throat:     Mouth: Mucous membranes are moist.     Pharynx: Oropharynx is clear.  Eyes:     Extraocular Movements: Extraocular movements intact.     Conjunctiva/sclera: Conjunctivae normal.  Neck:     Thyroid: No thyroid mass, thyromegaly or thyroid tenderness.  Cardiovascular:     Rate and Rhythm: Normal rate and regular rhythm.     Heart sounds: Normal heart sounds. No murmur heard. Pulmonary:     Effort: Pulmonary effort is normal. No respiratory distress.     Breath sounds: Normal breath sounds.  Abdominal:     General: Bowel sounds are normal. There is no distension.     Palpations: Abdomen is soft.     Tenderness: There is no abdominal tenderness. There is no guarding or rebound.  Musculoskeletal:     Right lower leg: No edema.  Left lower leg: No edema.  Skin:    General: Skin is warm and dry.  Neurological:     General: No focal deficit present.     Mental Status: She is alert and oriented to person, place, and time.  Psychiatric:        Mood and Affect: Mood normal.        Behavior: Behavior normal.     No results found for any visits on 06/29/22.    The ASCVD Risk score (Arnett DK, et al., 2019) failed to calculate for the following reasons:   The 2019 ASCVD risk score is only valid for ages 12 to 2    Assessment & Plan:   Melissa Zavala was seen today for medical management of chronic issues and hypertension.  Diagnoses and all orders for this visit:  Primary hypertension Well controlled on current regimen. Labs pending. Continue amlodipine, lasix, imdur, lisinopril. Established with  cardiology.  -     BMP8+EGFR  Mixed hyperlipidemia Hasn't tolerated statin and zetia in the past. Last LDL was minimally elevated.    Gastroesophageal reflux disease without esophagitis Well controlled on current regimen.   Abnormal TSH Will repeat and place new referral to endocrinology if TSH remains decreased.  -     TSH -     T4, Free -     T3, Free  Primary osteoarthritis involving multiple joints DDD (degenerative disc disease), lumbar Controlled substance agreement signed Well controlled, on current regimen. PDMP reviewed no red flags. UDS and CSA are UTD. Refills provided.  -     HYDROcodone-acetaminophen (NORCO) 5-325 MG tablet; Take 1 tablet by mouth every 8 (eight) hours as needed for moderate pain. -     HYDROcodone-acetaminophen (NORCO) 5-325 MG tablet; Take 1 tablet by mouth every 8 (eight) hours as needed for moderate pain. -     HYDROcodone-acetaminophen (NORCO) 5-325 MG tablet; Take 1 tablet by mouth every 8 (eight) hours as needed for moderate pain.  Impaired functional mobility, balance, gait, and endurance Referral to PT as requested.  -     Ambulatory referral to Physical Therapy   Return in about 3 months (around 09/27/2022) for chronic follow up.   The patient indicates understanding of these issues and agrees with the plan.  Gwenlyn Perking, FNP

## 2022-06-29 NOTE — Addendum Note (Signed)
Addended by: Gwenlyn Perking on: 06/29/2022 04:07 PM   Modules accepted: Level of Service

## 2022-06-30 LAB — BMP8+EGFR
BUN/Creatinine Ratio: 17 (ref 12–28)
BUN: 11 mg/dL (ref 8–27)
CO2: 26 mmol/L (ref 20–29)
Calcium: 10.3 mg/dL (ref 8.7–10.3)
Chloride: 98 mmol/L (ref 96–106)
Creatinine, Ser: 0.66 mg/dL (ref 0.57–1.00)
Glucose: 93 mg/dL (ref 70–99)
Potassium: 3.8 mmol/L (ref 3.5–5.2)
Sodium: 141 mmol/L (ref 134–144)
eGFR: 88 mL/min/{1.73_m2} (ref >59)

## 2022-06-30 LAB — TSH: TSH: 0.757 u[IU]/mL (ref 0.450–4.500)

## 2022-06-30 LAB — T4, FREE: Free T4: 1.31 ng/dL (ref 0.82–1.77)

## 2022-06-30 LAB — T3, FREE: T3, Free: 3 pg/mL (ref 2.0–4.4)

## 2022-07-05 ENCOUNTER — Other Ambulatory Visit: Payer: Self-pay | Admitting: Family Medicine

## 2022-07-05 DIAGNOSIS — M159 Polyosteoarthritis, unspecified: Secondary | ICD-10-CM

## 2022-07-05 DIAGNOSIS — R609 Edema, unspecified: Secondary | ICD-10-CM

## 2022-07-10 ENCOUNTER — Ambulatory Visit (INDEPENDENT_AMBULATORY_CARE_PROVIDER_SITE_OTHER): Payer: 59 | Admitting: Obstetrics & Gynecology

## 2022-07-10 ENCOUNTER — Encounter: Payer: Self-pay | Admitting: Obstetrics & Gynecology

## 2022-07-10 VITALS — BP 140/69 | HR 84 | Wt 201.0 lb

## 2022-07-10 DIAGNOSIS — L292 Pruritus vulvae: Secondary | ICD-10-CM

## 2022-07-10 DIAGNOSIS — E669 Obesity, unspecified: Secondary | ICD-10-CM | POA: Diagnosis not present

## 2022-07-10 DIAGNOSIS — L309 Dermatitis, unspecified: Secondary | ICD-10-CM | POA: Diagnosis not present

## 2022-07-10 MED ORDER — CLOBETASOL PROPIONATE 0.05 % EX OINT: TOPICAL_OINTMENT | CUTANEOUS | 0 refills | Status: DC

## 2022-07-10 NOTE — Progress Notes (Signed)
GYN VISIT Patient name: Melissa Zavala MRN [id number]  Date of birth: [birth year] Chief Complaint:   Follow-up  History of Present Illness:   Melissa Zavala is a 82 y.o. PM female being seen today for follow up regarding:  Vulvar/vaginitis: Last treated for BV and topical nystatin due to external irritation/itching.  This has been an ongoing issue since August 2023- previously unable to get clobetasol due to cost. She had tried less potent steroid ointment, which didn't seem to help.  Then at her last visit she was diagnosed with BV and treated with Metrogel as well as nystatin as the itching seemed to be mostly in her groin creases rather than the area of hypopigmentation.    Today she notes that it's "not as bad."  She does think the nystatin helped, but she ran out and the itching is still there.  She still rates it a 7/10.  Itching mostly at night, typically every 2-4 days per week.  It's very intermittent and not sure what make it worse or better.  Denies vaginal discharge.  Odor not nearly as strong.  She does not wear a pad any day.  Denies change in detergents or soaps.  No LMP recorded. Patient is postmenopausal.     06/29/2022    1:04 PM 02/16/2022    4:04 PM 02/01/2022    9:17 AM 01/06/2022    1:27 PM 01/03/2022    1:40 PM  Depression screen PHQ 2/9  Decreased Interest 0 0 0 0 0  Down, Depressed, Hopeless 0 0 0 0 0  PHQ - 2 Score 0 0 0 0 0  Altered sleeping 0 0  0 0  Tired, decreased energy 0 0  0 0  Change in appetite 0 0  0 0  Feeling bad or failure about yourself  0 0  0 0  Trouble concentrating 0 0  0 0  Moving slowly or fidgety/restless 0 0  0 0  Suicidal thoughts 0 0  0 0  PHQ-9 Score 0 0  0 0  Difficult doing work/chores Not difficult at all Not difficult at all  Not difficult at all      Review of Systems:   Pertinent items are noted in HPI Denies fever/chills, dizziness, headaches, visual disturbances, fatigue, shortness of breath, chest pain, abdominal pain,  vomiting Pertinent History Reviewed:  Reviewed past medical,surgical, social, obstetrical and family history.  Reviewed problem list, medications and allergies. Physical Assessment:   Vitals:   07/10/22 1028 07/10/22 1030  BP: (!) 148/66 (!) 140/69  Pulse: 75 84  Weight: 201 lb (91.2 kg)   Body mass index is 35.61 kg/m.       Physical Examination:   General appearance: alert, well appearing, and in no distress  Psych: mood appropriate, normal affect  Skin: warm & dry   Cardiovascular: normal heart rate noted  Respiratory: normal respiratory effort, no distress  Abdomen: soft, non-tender   Pelvic: white clitoral hood with loss of architecture.  Flattening with some hypopigmentation noted in bilateral groin creases.  No discrete mass or lesion appreciated.    Extremities: no edema   Chaperone:  Mickey Farber     Assessment & Plan:  1) Vulvar dermatitis -plan for Clobetasol every other day x 2 weeks then 2-3x per week x 2wk then patient to call in.  If notes improvement will then switch to a less potent steroid.  If no change may go back to the nystatin treatment  '[]'$  pt  to call in after 4wks   Meds ordered this encounter  Medications   clobetasol ointment (TEMOVATE) 0.05 %    Sig: Pea-sized amount as instructed- every other night x 2 weeks then 2-3x per week x 2 weeks    Dispense:  30 g    Refill:  0    Return in about 1 year (around 07/11/2023) for with Dr. Nelda Marseille, [place].   Janyth Pupa, DO Attending [place], [place] for [place], [place]

## 2022-07-11 NOTE — Progress Notes (Unsigned)
Office Visit    Patient Name: Melissa Zavala Date of Encounter: 07/12/2022  PCP:  Gwenlyn Perking, [place]  Cardiologist:  Carlyle Dolly, MD  Advanced Practice Provider:  No care team member to display Electrophysiologist:  None   HPI    Melissa Zavala is a 82 y.o. female with a past medical history significant for lower extremity edema, arthritis, chronic pain, GERD, hypertension presents today for follow-up.  She was last seen in the office 2/21 by Dr. Harl Bowie.  She had had several appointments that were canceled since then.  At that time, she was having some dyspnea on exertion and lower extremity edema.  She was on Lasix as needed which was controlling her swelling.  She was recently in the ED 11/27/2021 and was having chest pain and left flank pain.  She had a CT scan to evaluate her kidneys which showed a questionable right adrenal mass and left adnexal mass.  She also had some hypertension while in the hospital.  Troponin was 10.  It does not appear like it was trended.  No significant EKG findings.  Referred back to cardiology for work-up of chest pain.  She was last seen in the clinic 12/19/21 by myself. At that time she shared that she is having some lower extremity edema.  She was recently in the hospital because she was feeling weak and she started to have chest and flank pain.  She states her chest pain came on after eating a banana and after she took a heartburn pill it got better.  She had some imaging done in the hospital which we reviewed.  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  I will leave this up to her primary to workup.  We have discussed increasing her Lasix for fluid overload and wearing compression hose/socks.  Luckily, no further chest pains.  She was seen by me 11/28, she has some questions about her Imdur medication. She misunderstood and thought this medication was for anxiety.  I explained the mechanism of action and  encouraged her to take this medication.  We also discussed the results of her stress test.  BP was slightly elevated today but was better controlled than it had been in the past.  She is still having issues with lower extremity edema.  She has as needed Lasix but she does not like to take it.  She did have a fall recently and hit her right rib cage.  She has some soreness and bruising there.  Otherwise, doing well from a cardiac standpoint  Today, she has had no further falls and she has her cane with her today. She states the lasix is helping her with her LE edema. She has not had any SOB or chest pains. She plans to call [facility] and get some LE stocking to wear. Overall, doing well without any CV complaints today.  Reports no shortness of breath nor dyspnea on exertion. Reports no chest pain, pressure, or tightness. No edema, orthopnea, PND. Reports no palpitations.   Past Medical History    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   Osteoporosis    Vitamin B12 deficiency 12/06/2017   Past Surgical History:  Procedure Laterality Date   TOTAL HIP ARTHROPLASTY Right    TOTAL KNEE ARTHROPLASTY Bilateral    TUBAL LIGATION      Allergies  Allergies  Allergen Reactions   Iodinated Contrast Media Anaphylaxis   Diphenhydramine Other (See Comments)    Patient states "feels like somebody is inside of me clawing me out"   Codeine Other (See Comments)    Hallucinations   Statins Other (See Comments)    Joint pains and weakness. Joint pains and weakness. Joint pains and weakness.   Zetia [Ezetimibe] Other (See Comments)    Aching in arms and legs.    EKGs/Labs/Other Studies Reviewed:   The following studies were reviewed today:   EKG:  EKG is not  ordered today.   Recent Labs: 10/27/2021: BNP 49.8 02/16/2022: ALT 10; Hemoglobin 11.5; Platelets 241 06/29/2022: BUN 11; Creatinine, Ser 0.66; Potassium 3.8; Sodium 141;  TSH 0.757  Recent Lipid Panel    Component Value Date/Time   CHOL 209 (H) 10/27/2021 1630   TRIG 102 10/27/2021 1630   HDL 88 10/27/2021 1630   CHOLHDL 2.4 10/27/2021 1630   LDLCALC 103 (H) 10/27/2021 1630   Home Medications   Current Meds  Medication Sig   acetaminophen (TYLENOL) 500 MG tablet Take 1,000 mg by mouth every 8 (eight) hours as needed for headache or moderate pain. Pt takes 500 mg 2 tablets as needed.   albuterol (VENTOLIN HFA) 108 (90 Base) MCG/ACT inhaler Inhale 2 puffs into the lungs every 6 (six) hours as needed for wheezing or shortness of breath.   alendronate (FOSAMAX) 70 MG tablet TAKE ONE TABLET  EVERY 7 DAYS. TAKE WITH A FULL GLASS OF WATER, ON AN EMPTY STOMACH.   amLODipine (NORVASC) 10 MG tablet Take 10 mg by mouth daily.   CALCIUM PO Take 1 tablet by mouth daily.   celecoxib (CELEBREX) 200 MG capsule TAKE (1) CAPSULE DAILY.   clobetasol ointment (TEMOVATE) 0.05 % Pea-sized amount as instructed- every other night x 2 weeks then 2-3x per week x 2 weeks   esomeprazole (NEXIUM) 40 MG capsule TAKE (1) CAPSULE DAILY.   fluticasone (FLONASE) 50 MCG/ACT nasal spray Place 2 sprays into both nostrils daily.   furosemide (LASIX) 20 MG tablet Take 40 mg by mouth daily for 1 week, then 20 mg by mouth as needed for lower extremity swelling (Patient taking differently: Take 20 mg by mouth daily. Take 40 mg by mouth daily for 1 week, then 20 mg by mouth as needed for lower extremity swelling)   [START ON 07/14/2022] HYDROcodone-acetaminophen (NORCO) 5-325 MG tablet Take 1 tablet by mouth every 8 (eight) hours as needed for moderate pain.   isosorbide mononitrate (IMDUR) 30 MG 24 hr tablet Take 0.5 tablets (15 mg total) by mouth daily.   levocetirizine (XYZAL) 5 MG tablet TAKE 1 TABLET ONCE DAILY IN THE EVENING.   lisinopril (ZESTRIL) 40 MG tablet TAKE 1 TABLET ONCE DAILY.   nystatin cream (MYCOSTATIN) Apply 1-2x daily for 7 days then as needed   potassium chloride (KLOR-CON) 10  MEQ tablet TAKE ONE TABLET TWICE A DAY.   VITAMIN D PO Take 1 capsule by mouth daily.   [DISCONTINUED] HYDROcodone-acetaminophen (NORCO) 5-325 MG tablet Take 1 tablet by mouth every 8 (eight) hours as needed for moderate pain.   [DISCONTINUED] HYDROcodone-acetaminophen (NORCO) 5-325 MG tablet Take 1 tablet by mouth every 8 (eight) hours as needed for moderate pain.   [DISCONTINUED] HYDROcodone-acetaminophen (NORCO/VICODIN) 5-325 MG tablet Take 1 tablet by mouth every 8 (eight) hours as needed.     Review of Systems      All other systems reviewed and are otherwise negative except as  noted above.  Physical Exam    VS:  BP 132/70   Pulse 69   Ht '5\' 3"'$  (1.6 m)   Wt 200 lb 12.8 oz (91.1 kg)   SpO2 98%   BMI 35.57 kg/m  , BMI Body mass index is 35.57 kg/m.  Wt Readings from Last 3 Encounters:  07/12/22 200 lb 12.8 oz (91.1 kg)  07/10/22 201 lb (91.2 kg)  06/29/22 200 lb 8 oz (90.9 kg)     GEN: Well nourished, well developed, in no acute distress. HEENT: normal. Neck: Supple, no JVD, carotid bruits, or masses. Cardiac: RRR, 3/6 systolic murmur, rubs, or gallops. No clubbing, cyanosis, 1-2+ pitting edema.  Radials/PT 2+ and equal bilaterally.  Respiratory:  Respirations regular and unlabored, clear to auscultation bilaterally. GI: Soft, nontender, nondistended. MS: No deformity or atrophy. Skin: Warm and dry, no rash. Neuro:  Strength and sensation are intact. Psych: Normal affect.  Assessment & Plan    Chest pain -This has resolved.  Thought to be due to acid reflux.  Patient had a banana and then took a heartburn pill and the pain got better. -Troponin flat in the hospital and no significant EKG changes -Lexiscan myoview reviewed  -Encouraged her to take her Imdur 15 mg daily which has helped her BP as well  SOB/DOE -much improved fluid status today -lasix '40mg'$  daily and then an extra '20mg'$  as needed. -labs reviewed and creatinine stable 0.66 -We have asked her to take  her weight daily in the morning and keep track.  If she gains more than 3 pounds overnight or 5 pounds in a week she is to let us know. -Recommended lower extremity compression hose/socks.  We have provided her with information on the elastic therapy in [place] and a new prescription -Continue a low-sodium diet -We have decreased her Norvasc from 10 mg down to 5 mg and encouraged her to take her blood pressure regularly (she takes an extra Norvasc when her BP is elevated)  Heart murmur -Last echocardiogram 12/2021 showed mild mitral regurgitation, normal systolic function, and mild diastolic dysfunction (grade 2) -continue current medication regimen  Hypertension -Amlodipine '10mg'$  daily -Imdur recently added  Hyperlipidemia -LDL 63, at goal -Intolerant to any statin medications and also has an allergy to Zetia -per primary    Disposition: Follow up 6 months with Carlyle Dolly, MD or APP.  Signed, Elgie Collard, PA-C 07/12/2022, 1:30 PM [facility]

## 2022-07-12 ENCOUNTER — Encounter: Payer: Self-pay | Admitting: Physician Assistant

## 2022-07-12 ENCOUNTER — Ambulatory Visit: Payer: 59

## 2022-07-12 ENCOUNTER — Ambulatory Visit: Payer: 59 | Attending: Physician Assistant | Admitting: Physician Assistant

## 2022-07-12 VITALS — BP 132/70 | HR 69 | Ht 63.0 in | Wt 200.8 lb

## 2022-07-12 DIAGNOSIS — R0609 Other forms of dyspnea: Secondary | ICD-10-CM | POA: Diagnosis not present

## 2022-07-12 DIAGNOSIS — R011 Cardiac murmur, unspecified: Secondary | ICD-10-CM | POA: Diagnosis not present

## 2022-07-12 DIAGNOSIS — I10 Essential (primary) hypertension: Secondary | ICD-10-CM | POA: Diagnosis not present

## 2022-07-12 DIAGNOSIS — R079 Chest pain, unspecified: Secondary | ICD-10-CM | POA: Diagnosis not present

## 2022-07-12 DIAGNOSIS — R609 Edema, unspecified: Secondary | ICD-10-CM

## 2022-07-12 DIAGNOSIS — I340 Nonrheumatic mitral (valve) insufficiency: Secondary | ICD-10-CM

## 2022-07-12 DIAGNOSIS — E782 Mixed hyperlipidemia: Secondary | ICD-10-CM

## 2022-07-12 NOTE — Patient Instructions (Signed)
Medication Instructions:  Your physician recommends that you continue on your current medications as directed. Please refer to the Current Medication list given to you today.  *If you need a refill on your cardiac medications before your next appointment, please call your pharmacy*   Lab Work: None If you have labs (blood work) drawn today and your tests are completely normal, you will receive your results only by: [place] (if you have MyChart) OR A paper copy in the mail If you have any lab test that is abnormal or we need to change your treatment, we will call you to review the results.   Follow-Up: At [facility], you and your health needs are our priority.  As part of our continuing mission to provide you with exceptional heart care, we have created designated Provider Care Teams.  These Care Teams include your primary Cardiologist (physician) and Advanced Practice Providers (APPs -  Physician Assistants and Nurse Practitioners) who all work together to provide you with the care you need, when you need it.   Your next appointment:   6 month(s)  Provider:   Carlyle Dolly, MD

## 2022-07-13 ENCOUNTER — Encounter: Payer: Self-pay | Admitting: Radiology

## 2022-07-17 ENCOUNTER — Other Ambulatory Visit: Payer: Self-pay | Admitting: Physician Assistant

## 2022-07-17 DIAGNOSIS — R609 Edema, unspecified: Secondary | ICD-10-CM

## 2022-07-17 NOTE — Telephone Encounter (Signed)
Patient of Dr. Harl Bowie. Please review for refill. Thank you!

## 2022-07-17 NOTE — Telephone Encounter (Signed)
Please review for refill last prescribed by  Date: 12/19/2021 Department: [address] Office Ordering/Authorizing: Miguel Aschoff   Order Providers  Prescribing Provider Encounter Provider  Lidia Collum, [place]   Supervision Information  Encounter Supervising Provider Type of Supervision  Werner Lean, MD Supervision Required   Outpatient Medication Detail   Disp Refills Start End   furosemide (LASIX) 20 MG tablet 30 tablet 0 12/19/2021    Sig: Take 40 mg by mouth daily for 1 week, then 20 mg by mouth as needed for lower extremity swelling   Patient taking differently: Take 20 mg by mouth daily. Take 40 mg by mouth daily for 1 week, then 20 mg by mouth as needed for lower extremity swelling       Sent to pharmacy as: furosemide (LASIX) 20 MG tablet   E-Prescribing Status: Receipt confirmed by pharmacy (12/19/2021 12:41 PM EDT)

## 2022-07-18 ENCOUNTER — Ambulatory Visit: Payer: 59 | Attending: Family Medicine

## 2022-07-18 ENCOUNTER — Other Ambulatory Visit: Payer: Self-pay

## 2022-07-18 DIAGNOSIS — Z9181 History of falling: Secondary | ICD-10-CM | POA: Diagnosis not present

## 2022-07-18 DIAGNOSIS — Z7409 Other reduced mobility: Secondary | ICD-10-CM | POA: Insufficient documentation

## 2022-07-18 DIAGNOSIS — M6281 Muscle weakness (generalized): Secondary | ICD-10-CM | POA: Insufficient documentation

## 2022-07-18 NOTE — Therapy (Signed)
OUTPATIENT PHYSICAL THERAPY LOWER EXTREMITY EVALUATION   Patient Name: Melissa Zavala MRN: [id number] DOB:[birth year], 82 y.o., female Today's Date: 07/18/2022  END OF SESSION:  PT End of Session - 07/18/22 1119     Visit Number 1    Number of Visits 12    Date for PT Re-Evaluation 10/13/22    PT Start Time 1120    PT Stop Time 1150    PT Time Calculation (min) 30 min    Activity Tolerance Patient tolerated treatment well    Behavior During Therapy [facility] for tasks assessed/performed             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   Osteoporosis    Vitamin B12 deficiency 12/06/2017   Past Surgical History:  Procedure Laterality Date   TOTAL HIP ARTHROPLASTY Right    TOTAL KNEE ARTHROPLASTY Bilateral    TUBAL LIGATION     Patient Active Problem List   Diagnosis Date Noted   DDD (degenerative disc disease), lumbar 06/29/2022   Seasonal allergies 03/01/2021   Controlled substance agreement signed 09/07/2020   Mixed hyperlipidemia 06/14/2020   Essential tremor 06/08/2020   Obesity (BMI 30-39.9) 06/08/2020   Endometrial polyp 06/12/2019   Primary osteoarthritis involving multiple joints 12/06/2017   Edema 12/06/2017   Primary hypertension 02/19/2017   Gastroesophageal reflux disease without esophagitis 06/11/2016   Personal history of ovarian cyst 06/11/2016   Age-related osteoporosis without current pathological fracture 06/11/2016   Bilateral carpal tunnel syndrome 06/09/2016   Morbid obesity ([place]) 01/03/2016   REFERRING PROVIDER: Gwenlyn Perking, FNP   REFERRING DIAG: Impaired functional mobility, balance, gait, and endurance   THERAPY DIAG:  History of falling  Muscle weakness (generalized)  Rationale for Evaluation and Treatment: Rehabilitation  ONSET DATE: 05/06/22  SUBJECTIVE:   SUBJECTIVE STATEMENT: Patient reports that she fell on 05/06/22 when she tripped over her rug at home. She  was "good for a week or two", but then she had to go to the hospital. She had a chest x-ray because it hurt to sneeze. She feels that she needs to get her knees and ankles stronger. She did not have any problems with her balance prior to her fall. She currently does not feel safe walking without her cane.   PERTINENT HISTORY: HTN, osteoporosis, chronic low back pain, obesity, allergies, and OA PAIN:  Are you having pain? Yes: NPRS scale: 5-6/10 Pain location: bilateral ankles Pain description: aching, throbbing  PRECAUTIONS: Fall  WEIGHT BEARING RESTRICTIONS: No  FALLS:  Has patient fallen in last 6 months? Yes. 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 Diminished sensation to light touch throughout BLE.   EDEMA:  Mild edema observed in bilateral ankles  POSTURE: rounded shoulders and forward head  LOWER EXTREMITY ROM: WFL for activities assessed  LOWER EXTREMITY MMT:  MMT Right eval Left eval  Hip flexion 4+/5; lower leg pain  4/5; lower leg pain  Hip extension    Hip abduction  Hip adduction    Hip internal rotation    Hip external rotation    Knee flexion 4/5 4-/5  Knee extension 4/5; lower leg pain  4/5;lower leg pain   Ankle dorsiflexion 4/5 4/5  Ankle plantarflexion    Ankle inversion    Ankle eversion     (Blank rows = not tested)  FUNCTIONAL TESTS:  5 times sit to stand: 22.87 seconds w/o UE support Timed up and go (TUG): 18.95 seconds w/ SPC; 16.72 seconds w/o SPC    GAIT: Assistive device utilized: Single point cane Level of assistance: Modified independence Comments: step through pattern with decreased gait speed and stride length   TODAY'S TREATMENT:                                                                                                                              DATE:     PATIENT EDUCATION:  Education details: Plan of care, prognosis, and goals for therapy Person educated: Patient Education method: Explanation Education comprehension: verbalized understanding  HOME EXERCISE PROGRAM:   ASSESSMENT:  CLINICAL IMPRESSION: Patient is a 82 y.o. female who was seen today for physical therapy evaluation and treatment for instability and gait deviations secondary to a fall on 05/06/22.  She is at an elevated fall risk as evidenced by her history of falling, objective measures, and fear of falling.  Recommend that she continue with skilled physical therapy to address her impairments to maximize her safety and functional mobility.  OBJECTIVE IMPAIRMENTS: Abnormal gait, decreased activity tolerance, decreased balance, decreased mobility, difficulty walking, decreased strength, increased edema, impaired sensation, postural dysfunction, and pain.   ACTIVITY LIMITATIONS: carrying, lifting, bending, standing, stairs, transfers, and locomotion level  PARTICIPATION LIMITATIONS: meal prep, cleaning, laundry, shopping, community activity, and yard work  PERSONAL FACTORS: Age and 3+ comorbidities: HTN, osteoporosis, chronic low back pain, obesity, allergies, and OA  are also affecting patient's functional outcome.   REHAB POTENTIAL: Good  CLINICAL DECISION MAKING: Evolving/moderate complexity  EVALUATION COMPLEXITY: Moderate   GOALS: Goals reviewed with patient? Yes  SHORT TERM GOALS: Target date: 08/08/22 Patient will be independent with her initial HEP. Baseline: Goal status: INITIAL  2.  Patient will improve her timed up and go to 14  seconds or less without an assistive device for improved safety. Baseline:  Goal status: INITIAL  3.  Patient will improve her 5 times sit to stand to 16 seconds or less for improved lower extremity power. Baseline:  Goal status: INITIAL  4.  Patient will be able to safely ambulate at least 80 feet without an assistive device for improved household mobility. Baseline:  Goal status: INITIAL  LONG TERM GOALS: Target date: 08/29/22  Patient will be independent with her advanced HEP. Baseline:  Goal status: INITIAL  2.  Patient will improve her 5 times sit to stand to 12 seconds or less for reduced fall risk. Baseline:  Goal status: INITIAL  3.  Patient will be  able to confidently ambulate for at least 10 minutes without an assistive device for improved community mobility.   Baseline:  Goal status: INITIAL  PLAN:  PT FREQUENCY: 2x/week  PT DURATION: 6 weeks  PLANNED INTERVENTIONS: Therapeutic exercises, Therapeutic activity, Neuromuscular re-education, Balance training, Gait training, Patient/Family education, Self Care, Stair training, Cryotherapy, Moist heat, Manual therapy, and Re-evaluation  PLAN FOR NEXT SESSION: nustep, lower extremity strengthening, and balance interventions   Darlin Coco, PT 07/18/2022, 6:11 PM

## 2022-07-20 ENCOUNTER — Ambulatory Visit: Payer: 59 | Admitting: Physical Therapy

## 2022-07-20 ENCOUNTER — Encounter: Payer: Self-pay | Admitting: Physical Therapy

## 2022-07-20 ENCOUNTER — Other Ambulatory Visit: Payer: Self-pay | Admitting: Family Medicine

## 2022-07-20 DIAGNOSIS — I10 Essential (primary) hypertension: Secondary | ICD-10-CM

## 2022-07-20 DIAGNOSIS — Z7409 Other reduced mobility: Secondary | ICD-10-CM | POA: Diagnosis not present

## 2022-07-20 DIAGNOSIS — M6281 Muscle weakness (generalized): Secondary | ICD-10-CM

## 2022-07-20 DIAGNOSIS — Z9181 History of falling: Secondary | ICD-10-CM

## 2022-07-20 MED ORDER — AMLODIPINE BESYLATE 10 MG PO TABS: 10.0000 mg | ORAL_TABLET | Freq: Every day | ORAL | 3 refills | Status: DC

## 2022-07-20 MED ORDER — LISINOPRIL 40 MG PO TABS: 40.0000 mg | ORAL_TABLET | Freq: Every day | ORAL | 5 refills | Status: DC

## 2022-07-20 NOTE — Therapy (Signed)
OUTPATIENT PHYSICAL THERAPY LOWER EXTREMITY EVALUATION   Patient Name: Melissa Zavala MRN: [id number] DOB:1940/11/23, 82 y.o., female Today's Date: 07/20/2022  END OF SESSION:  PT End of Session - 07/20/22 0949     Visit Number 2    Number of Visits 12    Date for PT Re-Evaluation 10/13/22    PT Start Time 0948    PT Stop Time 1030    PT Time Calculation (min) 42 min    Activity Tolerance Patient tolerated treatment well    Behavior During Therapy [facility] for tasks assessed/performed            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   Osteoporosis    Vitamin B12 deficiency 12/06/2017   Past Surgical History:  Procedure Laterality Date   TOTAL HIP ARTHROPLASTY Right    TOTAL KNEE ARTHROPLASTY Bilateral    TUBAL LIGATION     Patient Active Problem List   Diagnosis Date Noted   DDD (degenerative disc disease), lumbar 06/29/2022   Seasonal allergies 03/01/2021   Controlled substance agreement signed 09/07/2020   Mixed hyperlipidemia 06/14/2020   Essential tremor 06/08/2020   Obesity (BMI 30-39.9) 06/08/2020   Endometrial polyp 06/12/2019   Primary osteoarthritis involving multiple joints 12/06/2017   Edema 12/06/2017   Primary hypertension 02/19/2017   Gastroesophageal reflux disease without esophagitis 06/11/2016   Personal history of ovarian cyst 06/11/2016   Age-related osteoporosis without current pathological fracture 06/11/2016   Bilateral carpal tunnel syndrome 06/09/2016   Morbid obesity ([place]) 01/03/2016   REFERRING PROVIDER: Gwenlyn Perking, FNP   REFERRING DIAG: Impaired functional mobility, balance, gait, and endurance   THERAPY DIAG:  History of falling  Muscle weakness (generalized)  Rationale for Evaluation and Treatment: Rehabilitation  ONSET DATE: 05/06/22  SUBJECTIVE:   SUBJECTIVE STATEMENT: Reports that her legs feel weak from her knees down.   PERTINENT HISTORY: HTN,  osteoporosis, chronic low back pain, obesity, allergies, and OA  PAIN: Are you having pain? 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 Diminished sensation to light touch throughout BLE.   EDEMA:  Mild edema observed in bilateral ankles  POSTURE: rounded shoulders and forward head  LOWER EXTREMITY ROM: WFL for activities assessed  LOWER EXTREMITY MMT:  MMT Right eval Left eval  Hip flexion 4+/5; lower leg pain  4/5; lower leg pain  Hip extension    Hip abduction    Hip adduction    Hip internal rotation    Hip external rotation    Knee flexion 4/5 4-/5  Knee extension 4/5; lower leg pain  4/5;lower leg pain   Ankle dorsiflexion 4/5 4/5  Ankle plantarflexion    Ankle inversion    Ankle eversion     (Blank rows = not tested)  FUNCTIONAL TESTS:  5 times sit to stand: 22.87 seconds w/o UE support Timed up and go (TUG): 18.95 seconds w/ SPC; 16.72 seconds w/o SPC   GAIT: Assistive device utilized: Single point cane Level of assistance: Modified independence Comments: step through pattern with decreased gait speed and stride length  TODAY'S TREATMENT:  DATE:                  EXERCISE LOG  Exercise Repetitions and Resistance Comments  Nustep L3 x17 min   LAQ 3# x20 reps   HS curls Yellow x20 reps   Hip marching 3# x20 reps   clams Yellow x20 reps   Heel/ toe raises 20 reps   Standing marching 15 reps   DLS on airex Min to no UE support x2 min    Blank cell = exercise not performed today   PATIENT EDUCATION:  Education details: Plan of care, prognosis, and goals for therapy Person educated: Patient Education  method: Explanation Education comprehension: verbalized understanding  HOME EXERCISE PROGRAM:  ASSESSMENT:  CLINICAL IMPRESSION: Patient presented in clinic with reports of no new falls. Patient does think of potentially getting rugs up for fall prevention. Patient able to tolerate light therex fairly well although hip flexion in sitting and standing fairly limited. Patient currently using [facility] for precaution. No complaints following end of treatment.  OBJECTIVE IMPAIRMENTS: Abnormal gait, decreased activity tolerance, decreased balance, decreased mobility, difficulty walking, decreased strength, increased edema, impaired sensation, postural dysfunction, and pain.   ACTIVITY LIMITATIONS: carrying, lifting, bending, standing, stairs, transfers, and locomotion level  PARTICIPATION LIMITATIONS: meal prep, cleaning, laundry, shopping, community activity, and yard work  PERSONAL FACTORS: Age and 3+ comorbidities: HTN, osteoporosis, chronic low back pain, obesity, allergies, and OA  are also affecting patient's functional outcome.   REHAB POTENTIAL: Good  CLINICAL DECISION MAKING: Evolving/moderate complexity  EVALUATION COMPLEXITY: Moderate   GOALS: Goals reviewed with patient? Yes  SHORT TERM GOALS: Target date: 08/08/22 Patient will be independent with her initial HEP. Baseline: Goal status: INITIAL  2.  Patient will improve her timed up and go to 14 seconds or less without an assistive device for improved safety. Baseline:  Goal status: INITIAL  3.  Patient will improve her 5 times sit to stand to 16 seconds or less for improved lower extremity power. Baseline:  Goal status: INITIAL  4.  Patient will be able to safely ambulate at least 80 feet without an assistive device for improved household mobility. Baseline:  Goal status: INITIAL  LONG TERM GOALS: Target date: 08/29/22  Patient will be independent with her advanced HEP. Baseline:  Goal status: INITIAL  2.  Patient  will improve her 5 times sit to stand to 12 seconds or less for reduced fall risk. Baseline:  Goal status: INITIAL  3.  Patient will be able to confidently ambulate for at least 10 minutes without an assistive device for improved community mobility.   Baseline:  Goal status: INITIAL  PLAN:  PT FREQUENCY: 2x/week  PT DURATION: 6 weeks  PLANNED INTERVENTIONS: Therapeutic exercises, Therapeutic activity, Neuromuscular re-education, Balance training, Gait training, Patient/Family education, Self Care, Stair training, Cryotherapy, Moist heat, Manual therapy, and Re-evaluation  PLAN FOR NEXT SESSION: nustep, lower extremity strengthening, and balance interventions  Standley Brooking, PTA 07/20/2022, 1:12 PM

## 2022-07-20 NOTE — Telephone Encounter (Signed)
  Prescription Request  07/20/2022  Is this a "Controlled Substance" medicine? NO  Have you seen your PCP in the last 2 weeks? NO  If YES, route message to pool  -  If NO, patient needs to be scheduled for appointment.  What is the name of the medication or equipment? Lisinopril 40 mg, Amlodipine 10 mg Patient has misplaced the last refill she got and needs another refill.  Have you contacted your pharmacy to request a refill? NO   Which pharmacy would you like this sent to? [facility]   Patient notified that their request is being sent to the clinical staff for review and that they should receive a response within 2 business days.

## 2022-07-25 DIAGNOSIS — B351 Tinea unguium: Secondary | ICD-10-CM | POA: Diagnosis not present

## 2022-07-25 DIAGNOSIS — M79676 Pain in unspecified toe(s): Secondary | ICD-10-CM | POA: Diagnosis not present

## 2022-07-26 ENCOUNTER — Ambulatory Visit: Payer: 59

## 2022-07-26 DIAGNOSIS — Z9181 History of falling: Secondary | ICD-10-CM

## 2022-07-26 DIAGNOSIS — M6281 Muscle weakness (generalized): Secondary | ICD-10-CM | POA: Diagnosis not present

## 2022-07-26 DIAGNOSIS — Z7409 Other reduced mobility: Secondary | ICD-10-CM | POA: Diagnosis not present

## 2022-07-26 NOTE — Therapy (Signed)
OUTPATIENT PHYSICAL THERAPY LOWER EXTREMITY TREATMENT   Patient Name: Melissa Zavala MRN: [id number] DOB:September 04, 1940, 82 y.o., female Today's Date: 07/26/2022  END OF SESSION:  PT End of Session - 07/26/22 1034     Visit Number 3    Number of Visits 12    Date for PT Re-Evaluation 10/13/22    PT Start Time 1030    PT Stop Time 1112    PT Time Calculation (min) 42 min    Activity Tolerance Patient tolerated treatment well    Behavior During Therapy [facility] for tasks assessed/performed            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   Osteoporosis    Vitamin B12 deficiency 12/06/2017   Past Surgical History:  Procedure Laterality Date   TOTAL HIP ARTHROPLASTY Right    TOTAL KNEE ARTHROPLASTY Bilateral    TUBAL LIGATION     Patient Active Problem List   Diagnosis Date Noted   DDD (degenerative disc disease), lumbar 06/29/2022   Seasonal allergies 03/01/2021   Controlled substance agreement signed 09/07/2020   Mixed hyperlipidemia 06/14/2020   Essential tremor 06/08/2020   Obesity (BMI 30-39.9) 06/08/2020   Endometrial polyp 06/12/2019   Primary osteoarthritis involving multiple joints 12/06/2017   Edema 12/06/2017   Primary hypertension 02/19/2017   Gastroesophageal reflux disease without esophagitis 06/11/2016   Personal history of ovarian cyst 06/11/2016   Age-related osteoporosis without current pathological fracture 06/11/2016   Bilateral carpal tunnel syndrome 06/09/2016   Morbid obesity ([place]) 01/03/2016   REFERRING PROVIDER: Gwenlyn Perking, FNP   REFERRING DIAG: Impaired functional mobility, balance, gait, and endurance   THERAPY DIAG:  History of falling  Muscle weakness (generalized)  Rationale for Evaluation and Treatment: Rehabilitation  ONSET DATE: 05/06/22  SUBJECTIVE:   SUBJECTIVE STATEMENT: Pt denies any pain today.  Reports feeling "okay" today.    PERTINENT HISTORY: HTN,  osteoporosis, chronic low back pain, obesity, allergies, and OA  PAIN: Are you having pain? 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 Diminished sensation to light touch throughout BLE.   EDEMA:  Mild edema observed in bilateral ankles  POSTURE: rounded shoulders and forward head  LOWER EXTREMITY ROM: WFL for activities assessed  LOWER EXTREMITY MMT:  MMT Right eval Left eval  Hip flexion 4+/5; lower leg pain  4/5; lower leg pain  Hip extension    Hip abduction    Hip adduction    Hip internal rotation    Hip external rotation    Knee flexion 4/5 4-/5  Knee extension 4/5; lower leg pain  4/5;lower leg pain   Ankle dorsiflexion 4/5 4/5  Ankle plantarflexion    Ankle inversion    Ankle eversion     (Blank rows = not tested)  FUNCTIONAL TESTS:  5 times sit to stand: 22.87 seconds w/o UE support Timed up and go (TUG): 18.95 seconds w/ SPC; 16.72 seconds w/o SPC   GAIT: Assistive device utilized: Single point cane Level of assistance: Modified independence Comments: step through pattern with decreased gait speed and stride length  TODAY'S TREATMENT:  DATE:                  EXERCISE LOG  Exercise Repetitions and Resistance Comments  Nustep L3 x17 min   Heel/Toe Raises X20 reps each   Standing Marches  X2 mins   LAQ 3# x25 reps   HS curls Yellow x20 reps   Hip marching 3# x25 reps   clams Yellow x2 [place]   Ball Squeeze X3 mins   Heel/ toe raises    Standing marching    DLS on airex     Blank cell = exercise not performed today   PATIENT EDUCATION:  Education details: Plan of care, prognosis, and goals for therapy Person  educated: Patient Education method: Explanation Education comprehension: verbalized understanding  HOME EXERCISE PROGRAM:  ASSESSMENT:  CLINICAL IMPRESSION: Pt arrives for today's treatment session denying any pain.  Pt able to tolerate increased reps with all exercises today without issue or complaint of pain.  Pt requiring min cues for proper technique with newly added exercises today.  Pt cramping with seated hip abduction today, but able to perform all reps asked of her.  Pt denied any pain at completion of today's treatment session.  OBJECTIVE IMPAIRMENTS: Abnormal gait, decreased activity tolerance, decreased balance, decreased mobility, difficulty walking, decreased strength, increased edema, impaired sensation, postural dysfunction, and pain.   ACTIVITY LIMITATIONS: carrying, lifting, bending, standing, stairs, transfers, and locomotion level  PARTICIPATION LIMITATIONS: meal prep, cleaning, laundry, shopping, community activity, and yard work  PERSONAL FACTORS: Age and 3+ comorbidities: HTN, osteoporosis, chronic low back pain, obesity, allergies, and OA  are also affecting patient's functional outcome.   REHAB POTENTIAL: Good  CLINICAL DECISION MAKING: Evolving/moderate complexity  EVALUATION COMPLEXITY: Moderate   GOALS: Goals reviewed with patient? Yes  SHORT TERM GOALS: Target date: 08/08/22 Patient will be independent with her initial HEP. Baseline: Goal status: INITIAL  2.  Patient will improve her timed up and go to 14 seconds or less without an assistive device for improved safety. Baseline:  Goal status: INITIAL  3.  Patient will improve her 5 times sit to stand to 16 seconds or less for improved lower extremity power. Baseline:  Goal status: INITIAL  4.  Patient will be able to safely ambulate at least 80 feet without an assistive device for improved household mobility. Baseline:  Goal status: INITIAL  LONG TERM GOALS: Target date: 08/29/22  Patient  will be independent with her advanced HEP. Baseline:  Goal status: INITIAL  2.  Patient will improve her 5 times sit to stand to 12 seconds or less for reduced fall risk. Baseline:  Goal status: INITIAL  3.  Patient will be able to confidently ambulate for at least 10 minutes without an assistive device for improved community mobility.   Baseline:  Goal status: INITIAL  PLAN:  PT FREQUENCY: 2x/week  PT DURATION: 6 weeks  PLANNED INTERVENTIONS: Therapeutic exercises, Therapeutic activity, Neuromuscular re-education, Balance training, Gait training, Patient/Family education, Self Care, Stair training, Cryotherapy, Moist heat, Manual therapy, and Re-evaluation  PLAN FOR NEXT SESSION: nustep, lower extremity strengthening, and balance interventions  Kathrynn Ducking, PTA 07/26/2022, 11:25 AM

## 2022-07-28 ENCOUNTER — Encounter: Payer: Self-pay | Admitting: Physical Therapy

## 2022-07-28 ENCOUNTER — Ambulatory Visit: Payer: 59 | Admitting: Physical Therapy

## 2022-07-28 DIAGNOSIS — Z7409 Other reduced mobility: Secondary | ICD-10-CM | POA: Diagnosis not present

## 2022-07-28 DIAGNOSIS — M6281 Muscle weakness (generalized): Secondary | ICD-10-CM | POA: Diagnosis not present

## 2022-07-28 DIAGNOSIS — Z9181 History of falling: Secondary | ICD-10-CM | POA: Diagnosis not present

## 2022-07-28 NOTE — Therapy (Signed)
OUTPATIENT PHYSICAL THERAPY LOWER EXTREMITY TREATMENT   Patient Name: Melissa Zavala MRN: [id number] DOB:03-18-1941, 82 y.o., female Today's Date: 07/28/2022  END OF SESSION:  PT End of Session - 07/28/22 1129     Visit Number 4    Number of Visits 12    Date for PT Re-Evaluation 10/13/22    PT Start Time [id number]    PT Stop Time 1114    PT Time Calculation (min) 42 min    Activity Tolerance Patient tolerated treatment well    Behavior During Therapy [facility] for tasks assessed/performed            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   Osteoporosis    Vitamin B12 deficiency 12/06/2017   Past Surgical History:  Procedure Laterality Date   TOTAL HIP ARTHROPLASTY Right    TOTAL KNEE ARTHROPLASTY Bilateral    TUBAL LIGATION     Patient Active Problem List   Diagnosis Date Noted   DDD (degenerative disc disease), lumbar 06/29/2022   Seasonal allergies 03/01/2021   Controlled substance agreement signed 09/07/2020   Mixed hyperlipidemia 06/14/2020   Essential tremor 06/08/2020   Obesity (BMI 30-39.9) 06/08/2020   Endometrial polyp 06/12/2019   Primary osteoarthritis involving multiple joints 12/06/2017   Edema 12/06/2017   Primary hypertension 02/19/2017   Gastroesophageal reflux disease without esophagitis 06/11/2016   Personal history of ovarian cyst 06/11/2016   Age-related osteoporosis without current pathological fracture 06/11/2016   Bilateral carpal tunnel syndrome 06/09/2016   Morbid obesity ([place]) 01/03/2016   REFERRING PROVIDER: Gwenlyn Perking, FNP   REFERRING DIAG: Impaired functional mobility, balance, gait, and endurance   THERAPY DIAG:  History of falling  Muscle weakness (generalized)  Rationale for Evaluation and Treatment: Rehabilitation  ONSET DATE: 05/06/22  SUBJECTIVE:   SUBJECTIVE STATEMENT: Was able to do some yardwork yesterday but a little sore today.  PERTINENT  HISTORY: HTN, osteoporosis, chronic low back pain, obesity, allergies, and OA  PAIN: Are you having pain? 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 Diminished sensation to light touch throughout BLE.   EDEMA:  Mild edema observed in bilateral ankles  POSTURE: rounded shoulders and forward head  LOWER EXTREMITY ROM: WFL for activities assessed  LOWER EXTREMITY MMT:  MMT Right eval Left eval  Hip flexion 4+/5; lower leg pain  4/5; lower leg pain  Hip extension    Hip abduction    Hip adduction    Hip internal rotation    Hip external rotation    Knee flexion 4/5 4-/5  Knee extension 4/5; lower leg pain  4/5;lower leg pain   Ankle dorsiflexion 4/5 4/5  Ankle plantarflexion    Ankle inversion    Ankle eversion     (Blank rows = not tested)  FUNCTIONAL TESTS:  5 times sit to stand: 22.87 seconds w/o UE support Timed up and go (TUG): 18.95 seconds w/ SPC; 16.72 seconds w/o SPC   GAIT: Assistive device utilized: Single point cane Level of assistance: Modified independence Comments: step through pattern with decreased gait speed and stride length  TODAY'S TREATMENT:  DATE:                3/15  EXERCISE LOG  Exercise Repetitions and Resistance Comments  Nustep L3 x16 min   Heel/Toe Raises X20 reps each   Standing Marches  X2 mins   LAQ 4# x30 reps   HS curls Green x20 reps   clams Green x20 reps   Ball Squeeze X3 mins    Blank cell = exercise not performed today   PATIENT EDUCATION:  Education details: Plan of care, prognosis, and goals for therapy Person educated: Patient Education method: Explanation Education comprehension:  verbalized understanding  HOME EXERCISE PROGRAM:  ASSESSMENT:  CLINICAL IMPRESSION: Patient presented in clinic and was able to get out and do yardwork yesterday although sore. Patient able to do all therex well with increased resistance. Patient did report overall fatigue by the end of the standing exercise session.  OBJECTIVE IMPAIRMENTS: Abnormal gait, decreased activity tolerance, decreased balance, decreased mobility, difficulty walking, decreased strength, increased edema, impaired sensation, postural dysfunction, and pain.   ACTIVITY LIMITATIONS: carrying, lifting, bending, standing, stairs, transfers, and locomotion level  PARTICIPATION LIMITATIONS: meal prep, cleaning, laundry, shopping, community activity, and yard work  PERSONAL FACTORS: Age and 3+ comorbidities: HTN, osteoporosis, chronic low back pain, obesity, allergies, and OA  are also affecting patient's functional outcome.   REHAB POTENTIAL: Good  CLINICAL DECISION MAKING: Evolving/moderate complexity  EVALUATION COMPLEXITY: Moderate  GOALS: Goals reviewed with patient? Yes  SHORT TERM GOALS: Target date: 08/08/22 Patient will be independent with her initial HEP. Baseline: Goal status: INITIAL  2.  Patient will improve her timed up and go to 14 seconds or less without an assistive device for improved safety. Baseline:  Goal status: INITIAL  3.  Patient will improve her 5 times sit to stand to 16 seconds or less for improved lower extremity power. Baseline:  Goal status: INITIAL  4.  Patient will be able to safely ambulate at least 80 feet without an assistive device for improved household mobility. Baseline:  Goal status: INITIAL  LONG TERM GOALS: Target date: 08/29/22  Patient will be independent with her advanced HEP. Baseline:  Goal status: INITIAL  2.  Patient will improve her 5 times sit to stand to 12 seconds or less for reduced fall risk. Baseline:  Goal status: INITIAL  3.  Patient will be  able to confidently ambulate for at least 10 minutes without an assistive device for improved community mobility.   Baseline:  Goal status: INITIAL  PLAN:  PT FREQUENCY: 2x/week  PT DURATION: 6 weeks  PLANNED INTERVENTIONS: Therapeutic exercises, Therapeutic activity, Neuromuscular re-education, Balance training, Gait training, Patient/Family education, Self Care, Stair training, Cryotherapy, Moist heat, Manual therapy, and Re-evaluation  PLAN FOR NEXT SESSION: nustep, lower extremity strengthening, and balance interventions  Standley Brooking, PTA 07/28/2022, 12:27 PM

## 2022-08-03 ENCOUNTER — Ambulatory Visit: Payer: 59 | Admitting: *Deleted

## 2022-08-03 ENCOUNTER — Encounter: Payer: Self-pay | Admitting: *Deleted

## 2022-08-03 DIAGNOSIS — M6281 Muscle weakness (generalized): Secondary | ICD-10-CM

## 2022-08-03 DIAGNOSIS — Z7409 Other reduced mobility: Secondary | ICD-10-CM | POA: Diagnosis not present

## 2022-08-03 DIAGNOSIS — Z9181 History of falling: Secondary | ICD-10-CM

## 2022-08-03 NOTE — Therapy (Signed)
OUTPATIENT PHYSICAL THERAPY LOWER EXTREMITY TREATMENT   Patient Name: Melissa Zavala MRN: [id number] DOB:08/11/1940, 82 y.o., female Today's Date: 07/28/2022  END OF SESSION:  PT End of Session - 07/28/22 1129     Visit Number 4    Number of Visits 12    Date for PT Re-Evaluation 10/13/22    PT Start Time [id number]    PT Stop Time 1114    PT Time Calculation (min) 42 min    Activity Tolerance Patient tolerated treatment well    Behavior During Therapy [facility] for tasks assessed/performed            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   Osteoporosis    Vitamin B12 deficiency 12/06/2017   Past Surgical History:  Procedure Laterality Date   TOTAL HIP ARTHROPLASTY Right    TOTAL KNEE ARTHROPLASTY Bilateral    TUBAL LIGATION     Patient Active Problem List   Diagnosis Date Noted   DDD (degenerative disc disease), lumbar 06/29/2022   Seasonal allergies 03/01/2021   Controlled substance agreement signed 09/07/2020   Mixed hyperlipidemia 06/14/2020   Essential tremor 06/08/2020   Obesity (BMI 30-39.9) 06/08/2020   Endometrial polyp 06/12/2019   Primary osteoarthritis involving multiple joints 12/06/2017   Edema 12/06/2017   Primary hypertension 02/19/2017   Gastroesophageal reflux disease without esophagitis 06/11/2016   Personal history of ovarian cyst 06/11/2016   Age-related osteoporosis without current pathological fracture 06/11/2016   Bilateral carpal tunnel syndrome 06/09/2016   Morbid obesity ([place]) 01/03/2016   REFERRING PROVIDER: Gwenlyn Perking, FNP   REFERRING DIAG: Impaired functional mobility, balance, gait, and endurance   THERAPY DIAG:  History of falling  Muscle weakness (generalized)  Rationale for Evaluation and Treatment: Rehabilitation  ONSET DATE: 05/06/22  SUBJECTIVE:   SUBJECTIVE STATEMENT: Did good after last Rx  PERTINENT HISTORY: HTN, osteoporosis, chronic low back  pain, obesity, allergies, and OA  PAIN: Are you having pain? 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 Diminished sensation to light touch throughout BLE.   EDEMA:  Mild edema observed in bilateral ankles  POSTURE: rounded shoulders and forward head  LOWER EXTREMITY ROM: WFL for activities assessed  LOWER EXTREMITY MMT:  MMT Right eval Left eval  Hip flexion 4+/5; lower leg pain  4/5; lower leg pain  Hip extension    Hip abduction    Hip adduction    Hip internal rotation    Hip external rotation    Knee flexion 4/5 4-/5  Knee extension 4/5; lower leg pain  4/5;lower leg pain   Ankle dorsiflexion 4/5 4/5  Ankle plantarflexion    Ankle inversion    Ankle eversion     (Blank rows = not tested)  FUNCTIONAL TESTS:  5 times sit to stand: 22.87 seconds w/o UE support Timed up and go (TUG): 18.95 seconds w/ SPC; 16.72 seconds w/o SPC   GAIT: Assistive device utilized: Single point cane Level of assistance: Modified independence Comments: step through pattern with decreased gait speed and stride length  TODAY'S TREATMENT:  DATE:                3/21  EXERCISE LOG  Exercise Repetitions and Resistance Comments  Nustep L3 x16 min   balance Toe touches 6" box 2x10   Rockerboard X 3 mins   Heel/Toe Raises X20 reps each   Standing Marches  3x10   LAQ 4# 3x10   HS curls    clams RED x30-45 reps   Ball Squeeze X3 mins    Blank cell = exercise not performed today   PATIENT EDUCATION:  Education details: Plan of care, prognosis, and goals for therapy Person educated: Patient Education method: Explanation Education comprehension: verbalized  understanding  HOME EXERCISE PROGRAM:  ASSESSMENT:  CLINICAL IMPRESSION: Patient presented in clinic reporting doing fairly well today.Rx focused on balance exs as well as LE strengthening. Pt reports doing better with balance since starting PT.   OBJECTIVE IMPAIRMENTS: Abnormal gait, decreased activity tolerance, decreased balance, decreased mobility, difficulty walking, decreased strength, increased edema, impaired sensation, postural dysfunction, and pain.   ACTIVITY LIMITATIONS: carrying, lifting, bending, standing, stairs, transfers, and locomotion level  PARTICIPATION LIMITATIONS: meal prep, cleaning, laundry, shopping, community activity, and yard work  PERSONAL FACTORS: Age and 3+ comorbidities: HTN, osteoporosis, chronic low back pain, obesity, allergies, and OA  are also affecting patient's functional outcome.   REHAB POTENTIAL: Good  CLINICAL DECISION MAKING: Evolving/moderate complexity  EVALUATION COMPLEXITY: Moderate  GOALS: Goals reviewed with patient? Yes  SHORT TERM GOALS: Target date: 08/08/22 Patient will be independent with her initial HEP. Baseline: Goal status: INITIAL  2.  Patient will improve her timed up and go to 14 seconds or less without an assistive device for improved safety. Baseline:  Goal status: INITIAL  3.  Patient will improve her 5 times sit to stand to 16 seconds or less for improved lower extremity power. Baseline:  Goal status: INITIAL  4.  Patient will be able to safely ambulate at least 80 feet without an assistive device for improved household mobility. Baseline:  Goal status: INITIAL  LONG TERM GOALS: Target date: 08/29/22  Patient will be independent with her advanced HEP. Baseline:  Goal status: INITIAL  2.  Patient will improve her 5 times sit to stand to 12 seconds or less for reduced fall risk. Baseline:  Goal status: INITIAL  3.  Patient will be able to confidently ambulate for at least 10 minutes without an  assistive device for improved community mobility.   Baseline:  Goal status: INITIAL  PLAN:  PT FREQUENCY: 2x/week  PT DURATION: 6 weeks  PLANNED INTERVENTIONS: Therapeutic exercises, Therapeutic activity, Neuromuscular re-education, Balance training, Gait training, Patient/Family education, Self Care, Stair training, Cryotherapy, Moist heat, Manual therapy, and Re-evaluation  PLAN FOR NEXT SESSION: nustep, lower extremity strengthening, and balance interventions  Standley Brooking, PTA 07/28/2022, 12:27 PM

## 2022-08-04 ENCOUNTER — Ambulatory Visit: Payer: 59 | Admitting: Physical Therapy

## 2022-08-04 ENCOUNTER — Other Ambulatory Visit: Payer: Self-pay | Admitting: Nurse Practitioner

## 2022-08-04 ENCOUNTER — Encounter: Payer: Self-pay | Admitting: Physical Therapy

## 2022-08-04 DIAGNOSIS — Z9181 History of falling: Secondary | ICD-10-CM

## 2022-08-04 DIAGNOSIS — M6281 Muscle weakness (generalized): Secondary | ICD-10-CM

## 2022-08-04 DIAGNOSIS — Z7409 Other reduced mobility: Secondary | ICD-10-CM | POA: Diagnosis not present

## 2022-08-04 NOTE — Therapy (Deleted)
OUTPATIENT PHYSICAL THERAPY LOWER EXTREMITY TREATMENT   Patient Name: Melissa Zavala MRN: [id number] DOB:1940/12/12, 82 y.o., female Today's Date: 08/04/2022  END OF SESSION:  PT End of Session - 08/04/22 1008     Visit Number 6    Number of Visits 12    Date for PT Re-Evaluation 10/13/22    PT Start Time 0950    PT Stop Time [id number]    PT Time Calculation (min) 42 min    Activity Tolerance Patient tolerated treatment well    Behavior During Therapy [facility] for tasks assessed/performed            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   Osteoporosis    Vitamin B12 deficiency 12/06/2017   Past Surgical History:  Procedure Laterality Date   TOTAL HIP ARTHROPLASTY Right    TOTAL KNEE ARTHROPLASTY Bilateral    TUBAL LIGATION     Patient Active Problem List   Diagnosis Date Noted   DDD (degenerative disc disease), lumbar 06/29/2022   Seasonal allergies 03/01/2021   Controlled substance agreement signed 09/07/2020   Mixed hyperlipidemia 06/14/2020   Essential tremor 06/08/2020   Obesity (BMI 30-39.9) 06/08/2020   Endometrial polyp 06/12/2019   Primary osteoarthritis involving multiple joints 12/06/2017   Edema 12/06/2017   Primary hypertension 02/19/2017   Gastroesophageal reflux disease without esophagitis 06/11/2016   Personal history of ovarian cyst 06/11/2016   Age-related osteoporosis without current pathological fracture 06/11/2016   Bilateral carpal tunnel syndrome 06/09/2016   Morbid obesity ([place]) 01/03/2016   REFERRING PROVIDER: Gwenlyn Perking, FNP   REFERRING DIAG: Impaired functional mobility, balance, gait, and endurance   THERAPY DIAG:  History of falling  Muscle weakness (generalized)  Rationale for Evaluation and Treatment: Rehabilitation  ONSET DATE: 05/06/22  SUBJECTIVE:   SUBJECTIVE STATEMENT: No new falls. No AD today.  PERTINENT HISTORY: HTN, osteoporosis, chronic low back  pain, obesity, allergies, and OA  PAIN: Are you having pain? 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 Diminished sensation to light touch throughout BLE.   EDEMA:  Mild edema observed in bilateral ankles  POSTURE: rounded shoulders and forward head  LOWER EXTREMITY ROM: WFL for activities assessed  LOWER EXTREMITY MMT:  MMT Right eval Left eval  Hip flexion 4+/5; lower leg pain  4/5; lower leg pain  Hip extension    Hip abduction    Hip adduction    Hip internal rotation    Hip external rotation    Knee flexion 4/5 4-/5  Knee extension 4/5; lower leg pain  4/5;lower leg pain   Ankle dorsiflexion 4/5 4/5  Ankle plantarflexion    Ankle inversion    Ankle eversion     (Blank rows = not tested)  FUNCTIONAL TESTS:  5 times sit to stand: 22.87 seconds w/o UE support Timed up and go (TUG): 18.95 seconds w/ SPC; 16.72 seconds w/o SPC   GAIT: Assistive device utilized: Single point cane Level of assistance: Modified independence Comments: step through pattern with decreased gait speed and stride length  TODAY'S TREATMENT:  DATE:                3/22  EXERCISE LOG  Exercise Repetitions and Resistance Comments  Nustep L3 x18 min   Toe taps  6" box x2 min   Heel/Toe Raises X20 reps each   Hip abduction X20 reps   LAQ 4# 3x10   HS curls Red theraband x30 reps   clams Red x30reps   Sit to stands X10 reps no UE support    Blank cell = exercise not performed today   PATIENT EDUCATION:  Education details: Plan of care, prognosis, and goals for therapy Person educated: Patient Education method: Explanation Education comprehension:  verbalized understanding  HOME EXERCISE PROGRAM:  ASSESSMENT:  CLINICAL IMPRESSION: Patient presented in clinic with no reports of any new falls. Patient   OBJECTIVE IMPAIRMENTS: Abnormal gait, decreased activity tolerance, decreased balance, decreased mobility, difficulty walking, decreased strength, increased edema, impaired sensation, postural dysfunction, and pain.   ACTIVITY LIMITATIONS: carrying, lifting, bending, standing, stairs, transfers, and locomotion level  PARTICIPATION LIMITATIONS: meal prep, cleaning, laundry, shopping, community activity, and yard work  PERSONAL FACTORS: Age and 3+ comorbidities: HTN, osteoporosis, chronic low back pain, obesity, allergies, and OA  are also affecting patient's functional outcome.   REHAB POTENTIAL: Good  CLINICAL DECISION MAKING: Evolving/moderate complexity  EVALUATION COMPLEXITY: Moderate  GOALS: Goals reviewed with patient? Yes  SHORT TERM GOALS: Target date: 08/08/22 Patient will be independent with her initial HEP. Baseline: Goal status: INITIAL  2.  Patient will improve her timed up and go to 14 seconds or less without an assistive device for improved safety. Baseline:  Goal status: INITIAL  3.  Patient will improve her 5 times sit to stand to 16 seconds or less for improved lower extremity power. Baseline:  Goal status: INITIAL  4.  Patient will be able to safely ambulate at least 80 feet without an assistive device for improved household mobility. Baseline:  Goal status: INITIAL  LONG TERM GOALS: Target date: 08/29/22  Patient will be independent with her advanced HEP. Baseline:  Goal status: INITIAL  2.  Patient will improve her 5 times sit to stand to 12 seconds or less for reduced fall risk. Baseline:  Goal status: INITIAL  3.  Patient will be able to confidently ambulate for at least 10 minutes without an assistive device for improved community mobility.   Baseline:  Goal status: INITIAL  PLAN:  PT  FREQUENCY: 2x/week  PT DURATION: 6 weeks  PLANNED INTERVENTIONS: Therapeutic exercises, Therapeutic activity, Neuromuscular re-education, Balance training, Gait training, Patient/Family education, Self Care, Stair training, Cryotherapy, Moist heat, Manual therapy, and Re-evaluation  PLAN FOR NEXT SESSION: nustep, lower extremity strengthening, and balance interventions  Standley Brooking, PTA 08/04/2022, 11:49 AM

## 2022-08-04 NOTE — Therapy (Signed)
OUTPATIENT PHYSICAL THERAPY LOWER EXTREMITY TREATMENT   Patient Name: Melissa Zavala MRN: [id number] DOB:01-Jun-1940, 82 y.o., female Today's Date: 08/04/2022  END OF SESSION:  PT End of Session - 08/04/22 1008     Visit Number 6    Number of Visits 12    Date for PT Re-Evaluation 10/13/22    PT Start Time 0950    PT Stop Time [id number]    PT Time Calculation (min) 42 min    Activity Tolerance Patient tolerated treatment well    Behavior During Therapy [facility] for tasks assessed/performed            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   Osteoporosis    Vitamin B12 deficiency 12/06/2017   Past Surgical History:  Procedure Laterality Date   TOTAL HIP ARTHROPLASTY Right    TOTAL KNEE ARTHROPLASTY Bilateral    TUBAL LIGATION     Patient Active Problem List   Diagnosis Date Noted   DDD (degenerative disc disease), lumbar 06/29/2022   Seasonal allergies 03/01/2021   Controlled substance agreement signed 09/07/2020   Mixed hyperlipidemia 06/14/2020   Essential tremor 06/08/2020   Obesity (BMI 30-39.9) 06/08/2020   Endometrial polyp 06/12/2019   Primary osteoarthritis involving multiple joints 12/06/2017   Edema 12/06/2017   Primary hypertension 02/19/2017   Gastroesophageal reflux disease without esophagitis 06/11/2016   Personal history of ovarian cyst 06/11/2016   Age-related osteoporosis without current pathological fracture 06/11/2016   Bilateral carpal tunnel syndrome 06/09/2016   Morbid obesity ([place]) 01/03/2016   REFERRING PROVIDER: Gwenlyn Perking, FNP   REFERRING DIAG: Impaired functional mobility, balance, gait, and endurance   THERAPY DIAG:  History of falling  Muscle weakness (generalized)  Rationale for Evaluation and Treatment: Rehabilitation  ONSET DATE: 05/06/22  SUBJECTIVE:   SUBJECTIVE STATEMENT: No new falls. No AD today.  PERTINENT HISTORY: HTN, osteoporosis, chronic low back  pain, obesity, allergies, and OA  PAIN: Are you having pain? 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 Diminished sensation to light touch throughout BLE.   EDEMA:  Mild edema observed in bilateral ankles  POSTURE: rounded shoulders and forward head  LOWER EXTREMITY ROM: WFL for activities assessed  LOWER EXTREMITY MMT:  MMT Right eval Left eval  Hip flexion 4+/5; lower leg pain  4/5; lower leg pain  Hip extension    Hip abduction    Hip adduction    Hip internal rotation    Hip external rotation    Knee flexion 4/5 4-/5  Knee extension 4/5; lower leg pain  4/5;lower leg pain   Ankle dorsiflexion 4/5 4/5  Ankle plantarflexion    Ankle inversion    Ankle eversion     (Blank rows = not tested)  FUNCTIONAL TESTS:  5 times sit to stand: 22.87 seconds w/o UE support Timed up and go (TUG): 18.95 seconds w/ SPC; 16.72 seconds w/o SPC   GAIT: Assistive device utilized: Single point cane Level of assistance: Modified independence Comments: step through pattern with decreased gait speed and stride length  TODAY'S TREATMENT:  DATE:                3/22  EXERCISE LOG  Exercise Repetitions and Resistance Comments  Nustep L3 x18 min   Toe taps  6" box x2 min   Heel/Toe Raises X20 reps each   Hip abduction X20 reps   LAQ 4# 3x10   HS curls Red theraband x30 reps   clams Red x30reps   Sit to stands X10 reps no UE support    Blank cell = exercise not performed today   PATIENT EDUCATION:  Education details: Plan of care, prognosis, and goals for therapy Person educated: Patient Education method: Explanation Education comprehension:  verbalized understanding  HOME EXERCISE PROGRAM:  ASSESSMENT:  CLINICAL IMPRESSION: Patient presented in clinic with no reports of any new falls. Patient able to tolerate resistance and does well with no UE support for sit to stands. Gait without AD observed in clinic with no excessive gait deviations. Patient requires mild UE support for alternating toe taps. Reported fatigue by the end of the session.  OBJECTIVE IMPAIRMENTS: Abnormal gait, decreased activity tolerance, decreased balance, decreased mobility, difficulty walking, decreased strength, increased edema, impaired sensation, postural dysfunction, and pain.   ACTIVITY LIMITATIONS: carrying, lifting, bending, standing, stairs, transfers, and locomotion level  PARTICIPATION LIMITATIONS: meal prep, cleaning, laundry, shopping, community activity, and yard work  PERSONAL FACTORS: Age and 3+ comorbidities: HTN, osteoporosis, chronic low back pain, obesity, allergies, and OA  are also affecting patient's functional outcome.   REHAB POTENTIAL: Good  CLINICAL DECISION MAKING: Evolving/moderate complexity  EVALUATION COMPLEXITY: Moderate  GOALS: Goals reviewed with patient? Yes  SHORT TERM GOALS: Target date: 08/08/22 Patient will be independent with her initial HEP. Baseline: Goal status: INITIAL  2.  Patient will improve her timed up and go to 14 seconds or less without an assistive device for improved safety. Baseline:  Goal status: INITIAL  3.  Patient will improve her 5 times sit to stand to 16 seconds or less for improved lower extremity power. Baseline:  Goal status: INITIAL  4.  Patient will be able to safely ambulate at least 80 feet without an assistive device for improved household mobility. Baseline:  Goal status: INITIAL  LONG TERM GOALS: Target date: 08/29/22  Patient will be independent with her advanced HEP. Baseline:  Goal status: INITIAL  2.  Patient will improve her 5 times sit to stand to 12 seconds  or less for reduced fall risk. Baseline:  Goal status: INITIAL  3.  Patient will be able to confidently ambulate for at least 10 minutes without an assistive device for improved community mobility.   Baseline:  Goal status: INITIAL  PLAN:  PT FREQUENCY: 2x/week  PT DURATION: 6 weeks  PLANNED INTERVENTIONS: Therapeutic exercises, Therapeutic activity, Neuromuscular re-education, Balance training, Gait training, Patient/Family education, Self Care, Stair training, Cryotherapy, Moist heat, Manual therapy, and Re-evaluation  PLAN FOR NEXT SESSION: nustep, lower extremity strengthening, and balance interventions  Standley Brooking, PTA 08/04/2022, 11:49 AM

## 2022-08-08 ENCOUNTER — Encounter: Payer: Self-pay | Admitting: Physical Therapy

## 2022-08-08 ENCOUNTER — Ambulatory Visit: Payer: 59 | Admitting: Physical Therapy

## 2022-08-08 DIAGNOSIS — Z7409 Other reduced mobility: Secondary | ICD-10-CM | POA: Diagnosis not present

## 2022-08-08 DIAGNOSIS — M6281 Muscle weakness (generalized): Secondary | ICD-10-CM | POA: Diagnosis not present

## 2022-08-08 DIAGNOSIS — Z9181 History of falling: Secondary | ICD-10-CM | POA: Diagnosis not present

## 2022-08-08 NOTE — Therapy (Signed)
OUTPATIENT PHYSICAL THERAPY LOWER EXTREMITY TREATMENT   Patient Name: Melissa Zavala MRN: [id number] DOB:03-12-1941, 82 y.o., female Today's Date: 08/08/2022  END OF SESSION:  PT End of Session - 08/08/22 1117     Visit Number 7    Number of Visits 12    Date for PT Re-Evaluation 10/13/22    PT Start Time 1115    PT Stop Time 1200    PT Time Calculation (min) 45 min    Activity Tolerance Patient tolerated treatment well    Behavior During Therapy [facility] for tasks assessed/performed            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   Osteoporosis    Vitamin B12 deficiency 12/06/2017   Past Surgical History:  Procedure Laterality Date   TOTAL HIP ARTHROPLASTY Right    TOTAL KNEE ARTHROPLASTY Bilateral    TUBAL LIGATION     Patient Active Problem List   Diagnosis Date Noted   DDD (degenerative disc disease), lumbar 06/29/2022   Seasonal allergies 03/01/2021   Controlled substance agreement signed 09/07/2020   Mixed hyperlipidemia 06/14/2020   Essential tremor 06/08/2020   Obesity (BMI 30-39.9) 06/08/2020   Endometrial polyp 06/12/2019   Primary osteoarthritis involving multiple joints 12/06/2017   Edema 12/06/2017   Primary hypertension 02/19/2017   Gastroesophageal reflux disease without esophagitis 06/11/2016   Personal history of ovarian cyst 06/11/2016   Age-related osteoporosis without current pathological fracture 06/11/2016   Bilateral carpal tunnel syndrome 06/09/2016   Morbid obesity ([place]) 01/03/2016   REFERRING PROVIDER: Gwenlyn Perking, FNP   REFERRING DIAG: Impaired functional mobility, balance, gait, and endurance   THERAPY DIAG:  History of falling  Muscle weakness (generalized)  Rationale for Evaluation and Treatment: Rehabilitation  ONSET DATE: 05/06/22  SUBJECTIVE:   SUBJECTIVE STATEMENT: Reports she is sore from blowing leaves in her yard yesterday.  PERTINENT  HISTORY: HTN, osteoporosis, chronic low back pain, obesity, allergies, and OA  PAIN: Are you having pain? 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 Diminished sensation to light touch throughout BLE.   EDEMA: Mild edema observed in bilateral ankles  POSTURE: rounded shoulders and forward head  LOWER EXTREMITY ROM: WFL for activities assessed  LOWER EXTREMITY MMT:  MMT Right eval Left eval  Hip flexion 4+/5; lower leg pain  4/5; lower leg pain  Hip extension    Hip abduction    Hip adduction    Hip internal rotation    Hip external rotation    Knee flexion 4/5 4-/5  Knee extension 4/5; lower leg pain  4/5;lower leg pain   Ankle dorsiflexion 4/5 4/5  Ankle plantarflexion    Ankle inversion    Ankle eversion     (Blank rows = not tested)  FUNCTIONAL TESTS:  5 times sit to stand: 22.87 seconds w/o UE support Timed up and go (TUG): 18.95 seconds w/ SPC; 16.72 seconds w/o SPC   GAIT: Assistive device utilized: Single point cane Level of assistance: Modified independence Comments: step through pattern with decreased gait speed and stride length  TODAY'S TREATMENT:  DATE:                   3/26      EXERCISE LOG  Exercise Repetitions and Resistance Comments  Nustep L4 x15 min   Heel raises X20 reps   Hip flexion AROM x20 reps   Sidestepping Red x6 RT   Forward step ups 6" step x15 reps each   Sit to stands X5 reps in 11 seconds   LAQ X30 reps 4#   Ball squeeze X3 min    Blank cell = exercise not performed today         3/22  EXERCISE LOG  Exercise Repetitions and Resistance Comments  Nustep L3 x18 min   Toe taps  6" box x2 min    Heel/Toe Raises X20 reps each   Hip abduction X20 reps   LAQ 4# 3x10   HS curls Red theraband x30 reps   clams Red x30reps   Sit to stands X10 reps no UE support    Blank cell = exercise not performed today   PATIENT EDUCATION:  Education details: Plan of care, prognosis, and goals for therapy Person educated: Patient Education method: Explanation Education comprehension: verbalized understanding  HOME EXERCISE PROGRAM:  ASSESSMENT:  CLINICAL IMPRESSION: Patient presented in clinic with reports of soreness after blowing leaves in her yard yesterday. Patient does bring [place] into clinic but does not use between exercises. Patient progressed in resistance today for standing exercises with fatigue as compliant. Patient reported greater difficulty with RLE during LAQ. No complaints following end of treatment.  OBJECTIVE IMPAIRMENTS: Abnormal gait, decreased activity tolerance, decreased balance, decreased mobility, difficulty walking, decreased strength, increased edema, impaired sensation, postural dysfunction, and pain.   ACTIVITY LIMITATIONS: carrying, lifting, bending, standing, stairs, transfers, and locomotion level  PARTICIPATION LIMITATIONS: meal prep, cleaning, laundry, shopping, community activity, and yard work  PERSONAL FACTORS: Age and 3+ comorbidities: HTN, osteoporosis, chronic low back pain, obesity, allergies, and OA  are also affecting patient's functional outcome.   REHAB POTENTIAL: Good  CLINICAL DECISION MAKING: Evolving/moderate complexity  EVALUATION COMPLEXITY: Moderate  GOALS: Goals reviewed with patient? Yes  SHORT TERM GOALS: Target date: 08/08/22 Patient will be independent with her initial HEP. Baseline: Goal status: On-going  2.  Patient will improve her timed up and go to 14 seconds or less without an assistive device for improved safety. Baseline:  Goal status: On-going  3.  Patient will improve her 5 times sit to stand to 16 seconds or less  for improved lower extremity power. Baseline:  Goal status: MET  4.  Patient will be able to safely ambulate at least 80 feet without an assistive device for improved household mobility. Baseline:  Goal status: On-going  LONG TERM GOALS: Target date: 08/29/22  Patient will be independent with her advanced HEP. Baseline:  Goal status: On-going  2.  Patient will improve her 5 times sit to stand to 12 seconds or less for reduced fall risk. Baseline:  Goal status: MET  3.  Patient will be able to confidently ambulate for at least 10 minutes without an assistive device for improved community mobility.   Baseline:  Goal status: On-going  PLAN:  PT FREQUENCY: 2x/week  PT DURATION: 6 weeks  PLANNED INTERVENTIONS: Therapeutic exercises, Therapeutic activity, Neuromuscular re-education, Balance training, Gait training, Patient/Family education, Self Care, Stair training, Cryotherapy, Moist heat, Manual therapy, and Re-evaluation  PLAN FOR NEXT SESSION: nustep, lower extremity strengthening, and balance interventions  Standley Brooking, PTA 08/08/2022,  12:07 PM

## 2022-08-15 ENCOUNTER — Other Ambulatory Visit: Payer: Self-pay | Admitting: Family Medicine

## 2022-08-15 ENCOUNTER — Ambulatory Visit: Payer: 59 | Attending: Family Medicine

## 2022-08-15 DIAGNOSIS — M6281 Muscle weakness (generalized): Secondary | ICD-10-CM | POA: Insufficient documentation

## 2022-08-15 DIAGNOSIS — Z1231 Encounter for screening mammogram for malignant neoplasm of breast: Secondary | ICD-10-CM

## 2022-08-15 DIAGNOSIS — Z9181 History of falling: Secondary | ICD-10-CM | POA: Diagnosis not present

## 2022-08-15 NOTE — Therapy (Signed)
OUTPATIENT PHYSICAL THERAPY LOWER EXTREMITY TREATMENT   Patient Name: Melissa Zavala MRN: [id number] DOB:1940/07/01, 82 y.o., female Today's Date: 08/15/2022  END OF SESSION:  PT End of Session - 08/15/22 1118     Visit Number 8    Number of Visits 12    Date for PT Re-Evaluation 10/13/22    PT Start Time 1115    PT Stop Time 1200    PT Time Calculation (min) 45 min    Activity Tolerance Patient tolerated treatment well    Behavior During Therapy [facility] for tasks assessed/performed            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   Osteoporosis    Vitamin B12 deficiency 12/06/2017   Past Surgical History:  Procedure Laterality Date   TOTAL HIP ARTHROPLASTY Right    TOTAL KNEE ARTHROPLASTY Bilateral    TUBAL LIGATION     Patient Active Problem List   Diagnosis Date Noted   DDD (degenerative disc disease), lumbar 06/29/2022   Seasonal allergies 03/01/2021   Controlled substance agreement signed 09/07/2020   Mixed hyperlipidemia 06/14/2020   Essential tremor 06/08/2020   Obesity (BMI 30-39.9) 06/08/2020   Endometrial polyp 06/12/2019   Primary osteoarthritis involving multiple joints 12/06/2017   Edema 12/06/2017   Primary hypertension 02/19/2017   Gastroesophageal reflux disease without esophagitis 06/11/2016   Personal history of ovarian cyst 06/11/2016   Age-related osteoporosis without current pathological fracture 06/11/2016   Bilateral carpal tunnel syndrome 06/09/2016   Morbid obesity 01/03/2016   REFERRING PROVIDER: Gwenlyn Perking, FNP   REFERRING DIAG: Impaired functional mobility, balance, gait, and endurance   THERAPY DIAG:  History of falling  Muscle weakness (generalized)  Rationale for Evaluation and Treatment: Rehabilitation  ONSET DATE: 05/06/22  SUBJECTIVE:   SUBJECTIVE STATEMENT: Pt denies any pain upon arriving for today's treatment session, but reports that her ankles  her at night after a full day of activity.   PERTINENT HISTORY: HTN, osteoporosis, chronic low back pain, obesity, allergies, and OA  PAIN: Are you having pain? 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 Diminished sensation to light touch throughout BLE.   EDEMA: Mild edema observed in bilateral ankles  POSTURE: rounded shoulders and forward head  LOWER EXTREMITY ROM: WFL for activities assessed  LOWER EXTREMITY MMT:  MMT Right eval Left eval  Hip flexion 4+/5; lower leg pain  4/5; lower leg pain  Hip extension    Hip abduction    Hip adduction    Hip internal rotation    Hip external rotation    Knee flexion 4/5 4-/5  Knee extension 4/5; lower leg pain  4/5;lower leg pain   Ankle dorsiflexion 4/5 4/5  Ankle plantarflexion    Ankle inversion    Ankle eversion     (Blank rows = not tested)  FUNCTIONAL TESTS:  5 times sit to stand: 22.87 seconds w/o UE support Timed up and go (TUG): 18.95 seconds w/ SPC; 16.72 seconds w/o SPC   GAIT: Assistive device utilized: Single point cane Level of assistance: Modified independence Comments: step through pattern with decreased gait speed and stride length  TODAY'S TREATMENT:  DATE:                   4/2     EXERCISE LOG  Exercise Repetitions and Resistance Comments  Nustep L4 x15 min; seat 5   Heel raises X25 reps   Hip flexion AROM x25 reps bil   Hip abduction AROM x20 reps   Sidestepping    Forward step ups 6" step x25 reps each   Sit to stands X10 reps   LAQ 4# x 30 reps   Ball squeeze X3 min   Ham Curls (seated) Red t-band x25 reps    Blank cell = exercise not performed today    PATIENT EDUCATION:  Education details: Plan of care, prognosis, and goals for therapy Person educated: Patient Education method: Explanation Education comprehension: verbalized understanding  HOME EXERCISE PROGRAM:  ASSESSMENT:  CLINICAL IMPRESSION: Pt arrives for today's treatment session denying any pain, but reports that she has bil ankle pain at night.  Pt able to tolerate increased reps and or time with all exercises today without issue.  Pt reports feeling better since beginning therapy.  Pt denied any pain at completion of today's treatment session.  OBJECTIVE IMPAIRMENTS: Abnormal gait, decreased activity tolerance, decreased balance, decreased mobility, difficulty walking, decreased strength, increased edema, impaired sensation, postural dysfunction, and pain.   ACTIVITY LIMITATIONS: carrying, lifting, bending, standing, stairs, transfers, and locomotion level  PARTICIPATION LIMITATIONS: meal prep, cleaning, laundry, shopping, community activity, and yard work  PERSONAL FACTORS: Age and 3+ comorbidities: HTN, osteoporosis, chronic low back pain, obesity, allergies, and OA  are also affecting patient's functional outcome.   REHAB POTENTIAL: Good  CLINICAL DECISION MAKING: Evolving/moderate complexity  EVALUATION COMPLEXITY: Moderate  GOALS: Goals reviewed with patient? Yes  SHORT TERM GOALS: Target date: 08/08/22 Patient will be independent with her initial HEP. Baseline: Goal status: On-going  2.  Patient will improve her timed up and go to 14 seconds or less without an assistive device for improved safety. Baseline:  Goal status: On-going  3.  Patient will improve her 5 times sit to stand to 16 seconds or less for improved lower extremity power. Baseline:  Goal status: MET  4.  Patient will be able to safely ambulate at least 80 feet without an assistive device for improved household mobility. Baseline:  Goal status: On-going  LONG TERM GOALS: Target date:  08/29/22  Patient will be independent with her advanced HEP. Baseline:  Goal status: On-going  2.  Patient will improve her 5 times sit to stand to 12 seconds or less for reduced fall risk. Baseline:  Goal status: MET  3.  Patient will be able to confidently ambulate for at least 10 minutes without an assistive device for improved community mobility.   Baseline:  Goal status: On-going  PLAN:  PT FREQUENCY: 2x/week  PT DURATION: 6 weeks  PLANNED INTERVENTIONS: Therapeutic exercises, Therapeutic activity, Neuromuscular re-education, Balance training, Gait training, Patient/Family education, Self Care, Stair training, Cryotherapy, Moist heat, Manual therapy, and Re-evaluation  PLAN FOR NEXT SESSION: nustep, lower extremity strengthening, and balance interventions  Kathrynn Ducking, PTA 08/15/2022, 12:06 PM

## 2022-08-17 ENCOUNTER — Other Ambulatory Visit: Payer: Self-pay | Admitting: Family Medicine

## 2022-08-17 ENCOUNTER — Ambulatory Visit: Payer: 59 | Admitting: Physical Therapy

## 2022-08-17 DIAGNOSIS — M810 Age-related osteoporosis without current pathological fracture: Secondary | ICD-10-CM

## 2022-08-22 ENCOUNTER — Encounter: Payer: Self-pay | Admitting: Physical Therapy

## 2022-08-22 ENCOUNTER — Ambulatory Visit: Payer: 59 | Admitting: Physical Therapy

## 2022-08-22 DIAGNOSIS — Z9181 History of falling: Secondary | ICD-10-CM

## 2022-08-22 DIAGNOSIS — M6281 Muscle weakness (generalized): Secondary | ICD-10-CM

## 2022-08-22 NOTE — Therapy (Addendum)
OUTPATIENT PHYSICAL THERAPY LOWER EXTREMITY TREATMENT   Patient Name: Melissa Zavala MRN: [id number] DOB:1941-03-20, 82 y.o., female Today's Date: 08/22/2022  END OF SESSION:  PT End of Session - 08/22/22 1121     Visit Number 9    Number of Visits 12    Date for PT Re-Evaluation 10/13/22    PT Start Time 1117    PT Stop Time 1158    PT Time Calculation (min) 41 min    Activity Tolerance Patient tolerated treatment well    Behavior During Therapy [facility] for tasks assessed/performed            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   Osteoporosis    Vitamin B12 deficiency 12/06/2017   Past Surgical History:  Procedure Laterality Date   TOTAL HIP ARTHROPLASTY Right    TOTAL KNEE ARTHROPLASTY Bilateral    TUBAL LIGATION     Patient Active Problem List   Diagnosis Date Noted   DDD (degenerative disc disease), lumbar 06/29/2022   Seasonal allergies 03/01/2021   Controlled substance agreement signed 09/07/2020   Mixed hyperlipidemia 06/14/2020   Essential tremor 06/08/2020   Obesity (BMI 30-39.9) 06/08/2020   Endometrial polyp 06/12/2019   Primary osteoarthritis involving multiple joints 12/06/2017   Edema 12/06/2017   Primary hypertension 02/19/2017   Gastroesophageal reflux disease without esophagitis 06/11/2016   Personal history of ovarian cyst 06/11/2016   Age-related osteoporosis without current pathological fracture 06/11/2016   Bilateral carpal tunnel syndrome 06/09/2016   Morbid obesity 01/03/2016   REFERRING PROVIDER: Gabriel Earing, FNP   REFERRING DIAG: Impaired functional mobility, balance, gait, and endurance   THERAPY DIAG:  History of falling  Muscle weakness (generalized)  Rationale for Evaluation and Treatment: Rehabilitation  ONSET DATE: 05/06/22  SUBJECTIVE:   SUBJECTIVE STATEMENT: Will be going out of town after today for possibly a few weeks.  PERTINENT HISTORY: HTN,  osteoporosis, chronic low back pain, obesity, allergies, and OA  PAIN: Are you having pain? 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 Diminished sensation to light touch throughout BLE.   EDEMA: Mild edema observed in bilateral ankles  POSTURE: rounded shoulders and forward head  LOWER EXTREMITY ROM: WFL for activities assessed  LOWER EXTREMITY MMT:  MMT Right eval Left eval  Hip flexion 4+/5; lower leg pain  4/5; lower leg pain  Hip extension    Hip abduction    Hip adduction    Hip internal rotation    Hip external rotation    Knee flexion 4/5 4-/5  Knee extension 4/5; lower leg pain  4/5;lower leg pain   Ankle dorsiflexion 4/5 4/5  Ankle plantarflexion    Ankle inversion    Ankle eversion     (Blank rows = not tested)  FUNCTIONAL TESTS:  5 times sit to stand: 22.87 seconds w/o UE support Timed up and go (TUG): 18.95 seconds w/ SPC; 16.72 seconds w/o SPC   GAIT: Assistive device utilized: Single point cane Level of assistance: Modified independence Comments: step through pattern with decreased gait speed and stride length  TODAY'S TREATMENT:  DATE:                   4/9     EXERCISE LOG  Exercise Repetitions and Resistance Comments  Nustep L4 x17 min; seat 5   Heel raises X25 reps   Hip flexion AROM x25 reps bil   Hip abduction AROM x20 reps   Forward step ups 6" step x20 reps each   Sit to stands X10 reps   LAQ 4# x 20 reps   Ham Curls (seated) Green t-band x20 reps   Hip clams Green theraband x20 reps    Blank cell = exercise not performed today   PATIENT EDUCATION:  Education details: Plan of care, prognosis, and goals  for therapy Person educated: Patient Education method: Explanation Education comprehension: verbalized understanding  HOME EXERCISE PROGRAM:  ASSESSMENT:  CLINICAL IMPRESSION: Patient states that she has been very active in the last few days preparing for a possibly extended trip. Patient able to tolerate therex although required mild VC for redirection. Patient appeared more generally fatigued. To resume PT once she returns from trip.  PHYSICAL THERAPY DISCHARGE SUMMARY  Visits from Start of Care: 9  Current functional level related to goals / functional outcomes: Patient was able to partially meet her goals for skilled physical therapy.    Remaining deficits: Gait speed and safety    Education / Equipment: HEP   Patient agrees to discharge. Patient goals were partially met. Patient is being discharged due to not returning since the last visit.  Candi Leash, PT, DPT    OBJECTIVE IMPAIRMENTS: Abnormal gait, decreased activity tolerance, decreased balance, decreased mobility, difficulty walking, decreased strength, increased edema, impaired sensation, postural dysfunction, and pain.   ACTIVITY LIMITATIONS: carrying, lifting, bending, standing, stairs, transfers, and locomotion level  PARTICIPATION LIMITATIONS: meal prep, cleaning, laundry, shopping, community activity, and yard work  PERSONAL FACTORS: Age and 3+ comorbidities: HTN, osteoporosis, chronic low back pain, obesity, allergies, and OA  are also affecting patient's functional outcome.   REHAB POTENTIAL: Good  CLINICAL DECISION MAKING: Evolving/moderate complexity  EVALUATION COMPLEXITY: Moderate  GOALS: Goals reviewed with patient? Yes  SHORT TERM GOALS: Target date: 08/08/22 Patient will be independent with her initial HEP. Baseline: Goal status: On-going  2.  Patient will improve her timed up and go to 14 seconds or less without an assistive device for improved safety. Baseline:  Goal status:  On-going  3.  Patient will improve her 5 times sit to stand to 16 seconds or less for improved lower extremity power. Baseline:  Goal status: MET  4.  Patient will be able to safely ambulate at least 80 feet without an assistive device for improved household mobility. Baseline:  Goal status: On-going  LONG TERM GOALS: Target date: 08/29/22  Patient will be independent with her advanced HEP. Baseline:  Goal status: On-going  2.  Patient will improve her 5 times sit to stand to 12 seconds or less for reduced fall risk. Baseline:  Goal status: MET  3.  Patient will be able to confidently ambulate for at least 10 minutes without an assistive device for improved community mobility.   Baseline:  Goal status: On-going  PLAN:  PT FREQUENCY: 2x/week  PT DURATION: 6 weeks  PLANNED INTERVENTIONS: Therapeutic exercises, Therapeutic activity, Neuromuscular re-education, Balance training, Gait training, Patient/Family education, Self Care, Stair training, Cryotherapy, Moist heat, Manual therapy, and Re-evaluation  PLAN FOR NEXT SESSION: nustep, lower extremity strengthening, and balance interventions  Marvell Fuller, PTA 08/22/2022, 12:18  PM

## 2022-08-24 ENCOUNTER — Ambulatory Visit: Payer: 59 | Admitting: Physical Therapy

## 2022-09-06 ENCOUNTER — Other Ambulatory Visit: Payer: Self-pay | Admitting: Family Medicine

## 2022-09-06 DIAGNOSIS — K219 Gastro-esophageal reflux disease without esophagitis: Secondary | ICD-10-CM

## 2022-09-06 DIAGNOSIS — J302 Other seasonal allergic rhinitis: Secondary | ICD-10-CM

## 2022-09-27 ENCOUNTER — Other Ambulatory Visit: Payer: Self-pay | Admitting: Family Medicine

## 2022-09-27 DIAGNOSIS — J302 Other seasonal allergic rhinitis: Secondary | ICD-10-CM

## 2022-09-27 DIAGNOSIS — R609 Edema, unspecified: Secondary | ICD-10-CM

## 2022-09-28 ENCOUNTER — Ambulatory Visit: Payer: 59

## 2022-11-05 ENCOUNTER — Other Ambulatory Visit: Payer: Self-pay | Admitting: Family Medicine

## 2022-11-05 DIAGNOSIS — R609 Edema, unspecified: Secondary | ICD-10-CM

## 2022-11-05 DIAGNOSIS — M150 Primary generalized (osteo)arthritis: Secondary | ICD-10-CM

## 2022-11-05 DIAGNOSIS — M159 Polyosteoarthritis, unspecified: Secondary | ICD-10-CM

## 2022-11-21 ENCOUNTER — Encounter: Payer: Self-pay | Admitting: Nurse Practitioner

## 2022-11-21 ENCOUNTER — Ambulatory Visit (INDEPENDENT_AMBULATORY_CARE_PROVIDER_SITE_OTHER): Payer: 59 | Admitting: Nurse Practitioner

## 2022-11-21 VITALS — BP 136/64 | HR 63 | Temp 97.4°F | Ht 63.0 in | Wt 195.8 lb

## 2022-11-21 DIAGNOSIS — N898 Other specified noninflammatory disorders of vagina: Secondary | ICD-10-CM | POA: Insufficient documentation

## 2022-11-21 DIAGNOSIS — B3731 Acute candidiasis of vulva and vagina: Secondary | ICD-10-CM | POA: Insufficient documentation

## 2022-11-21 DIAGNOSIS — L309 Dermatitis, unspecified: Secondary | ICD-10-CM

## 2022-11-21 LAB — URINALYSIS, ROUTINE W REFLEX MICROSCOPIC
Bilirubin, UA: NEGATIVE
Glucose, UA: NEGATIVE
Ketones, UA: NEGATIVE
Leukocytes,UA: NEGATIVE
Nitrite, UA: NEGATIVE
Protein,UA: NEGATIVE
RBC, UA: NEGATIVE
Specific Gravity, UA: 1.015 (ref 1.005–1.030)
Urobilinogen, Ur: 0.2 mg/dL (ref 0.2–1.0)
pH, UA: 7 (ref 5.0–7.5)

## 2022-11-21 LAB — WET PREP FOR TRICH, YEAST, CLUE
Clue Cell Exam: NEGATIVE
Trichomonas Exam: NEGATIVE
Yeast Exam: POSITIVE — AB

## 2022-11-21 MED ORDER — FLUCONAZOLE 150 MG PO TABS: 150.0000 mg | ORAL_TABLET | Freq: Once | ORAL | 0 refills | Status: AC

## 2022-11-21 MED ORDER — CLOBETASOL PROPIONATE 0.05 % EX OINT: TOPICAL_OINTMENT | CUTANEOUS | 0 refills | Status: DC

## 2022-11-21 NOTE — Progress Notes (Signed)
Acute Office Visit  Subjective:     Patient ID: Melissa Zavala, female    DOB: [birth year], 82 y.o.   MRN: [id number]  Chief Complaint  Patient presents with   Vaginal Itching    Has seen obgyn was given cream but is not helping. Has been itching on and off for about 2 weeks.     HPI Melissa Zavala is an 82 yrs old seen today as an acute visit for vaginal  itchiness only at night for 2 weeks. She had the same issues in thepast and saw her GYN in Dec.  Has been using OTC Vagisil, and has been using Flagyl and bethametasol in the past that was helping. She reports increase urination UA & WET PREP FOR TRICH, YEAST, CLUE.no other symptoms ROS Negative unless indicated in HPI    Objective:    BP 136/64   Pulse 63   Temp (!) 97.4 F (36.3 C) (Temporal)   Ht 5\' 3"  (1.6 m)   Wt 195 lb 12.8 oz (88.8 kg)   SpO2 95%   BMI 34.68 kg/m  BP Readings from Last 3 Encounters:  11/21/22 136/64  07/12/22 132/70  07/10/22 (!) 140/69   Wt Readings from Last 3 Encounters:  11/21/22 195 lb 12.8 oz (88.8 kg)  07/12/22 200 lb 12.8 oz (91.1 kg)  07/10/22 201 lb (91.2 kg)      Physical Exam Vitals and nursing note reviewed. Exam conducted with a chaperone present [name]).  Constitutional:      Appearance: Normal appearance. She is obese.  HENT:     Head: Normocephalic and atraumatic.  Cardiovascular:     Rate and Rhythm: Normal rate.     Heart sounds: Normal heart sounds.  Pulmonary:     Effort: Pulmonary effort is normal. No respiratory distress.     Breath sounds: Normal breath sounds.  Abdominal:     General: Bowel sounds are normal.     Palpations: There is no mass.     Tenderness: There is no abdominal tenderness. There is no right CVA tenderness or left CVA tenderness.     Hernia: No hernia is present.  Genitourinary:    Exam position: Prone.     Pubic Area: No rash or pubic lice.      Labia:        Right: Tenderness present. No rash, lesion or injury.        Left:  Tenderness present. No lesion or injury.   Skin:    General: Skin is warm and dry.     Findings: No bruising or rash.  Neurological:     General: No focal deficit present.     Mental Status: She is alert and oriented to person, place, and time. Mental status is at baseline.  Psychiatric:        Mood and Affect: Mood normal.        Behavior: Behavior normal.        Thought Content: Thought content normal.        Judgment: Judgment normal.    Microscopic wet-mount exam shows few epithelial cells, yeast positive excessive bacteria, 0-2 white blood cells.  No results found for any visits on 11/21/22.      Assessment & Plan:  Vaginal itching -     WET PREP FOR TRICH, YEAST, CLUE -     Urinalysis, Routine w reflex microscopic -     Fluconazole; Take 1 tablet (150 mg total) by mouth once  for 1 dose. t  Dispense: 1 tablet; Refill: 0  Candidiasis of vagina -     Fluconazole; Take 1 tablet (150 mg total) by mouth once for 1 dose. t  Dispense: 1 tablet; Refill: 0  Vulvar dermatitis -     Clobetasol Propionate; Pea-sized amount as instructed- every other night x 2 weeks then 2-3x per week x 2 weeks  Dispense: 30 g; Refill: 0   [name] 82 yrs old female no acute distress Candidiasis of vagina: Flagyl 150 mg 1-tab Vulvar dermatitis: Clobetasol Propionate at bedtime for 2-weeks - Sitz baths:  soak your vulva in 2 or 3 inches of warm water. This is called a "sitz bath." You can do this for 5 minutes in the morning and 5 minutes at night. Do not add soap, bubble bath, or anything else to the water.  After urinating, wipe gently to avoid irritation. Use unscented soaps. Avoid using douches and other chemicals, such as bubble bath or hygiene spray Return if symptoms worsen or fail to improve.  Arrie Aran Santa Lighter, DNP [facility] [address] [place], [place] [address] [phone]

## 2022-11-21 NOTE — Progress Notes (Signed)
Results discuss with client during the encounter

## 2022-12-10 DIAGNOSIS — Z79899 Other long term (current) drug therapy: Secondary | ICD-10-CM | POA: Diagnosis not present

## 2022-12-10 DIAGNOSIS — Z20822 Contact with and (suspected) exposure to covid-19: Secondary | ICD-10-CM | POA: Diagnosis not present

## 2022-12-10 DIAGNOSIS — R0600 Dyspnea, unspecified: Secondary | ICD-10-CM | POA: Diagnosis not present

## 2022-12-10 DIAGNOSIS — Z8739 Personal history of other diseases of the musculoskeletal system and connective tissue: Secondary | ICD-10-CM | POA: Diagnosis not present

## 2022-12-10 DIAGNOSIS — E785 Hyperlipidemia, unspecified: Secondary | ICD-10-CM | POA: Diagnosis not present

## 2022-12-10 DIAGNOSIS — R0602 Shortness of breath: Secondary | ICD-10-CM | POA: Diagnosis not present

## 2022-12-10 DIAGNOSIS — R072 Precordial pain: Secondary | ICD-10-CM | POA: Diagnosis not present

## 2022-12-10 DIAGNOSIS — Z91041 Radiographic dye allergy status: Secondary | ICD-10-CM | POA: Diagnosis not present

## 2022-12-10 DIAGNOSIS — Z888 Allergy status to other drugs, medicaments and biological substances status: Secondary | ICD-10-CM | POA: Diagnosis not present

## 2022-12-10 DIAGNOSIS — R079 Chest pain, unspecified: Secondary | ICD-10-CM | POA: Diagnosis not present

## 2022-12-10 DIAGNOSIS — R0789 Other chest pain: Secondary | ICD-10-CM | POA: Diagnosis not present

## 2022-12-10 DIAGNOSIS — Z885 Allergy status to narcotic agent status: Secondary | ICD-10-CM | POA: Diagnosis not present

## 2022-12-10 DIAGNOSIS — I10 Essential (primary) hypertension: Secondary | ICD-10-CM | POA: Diagnosis not present

## 2022-12-10 DIAGNOSIS — I2510 Atherosclerotic heart disease of native coronary artery without angina pectoris: Secondary | ICD-10-CM | POA: Diagnosis not present

## 2022-12-10 DIAGNOSIS — R935 Abnormal findings on diagnostic imaging of other abdominal regions, including retroperitoneum: Secondary | ICD-10-CM | POA: Diagnosis not present

## 2022-12-10 DIAGNOSIS — R1032 Left lower quadrant pain: Secondary | ICD-10-CM | POA: Diagnosis not present

## 2022-12-10 DIAGNOSIS — I493 Ventricular premature depolarization: Secondary | ICD-10-CM | POA: Diagnosis not present

## 2022-12-10 DIAGNOSIS — M549 Dorsalgia, unspecified: Secondary | ICD-10-CM | POA: Diagnosis not present

## 2022-12-10 DIAGNOSIS — G4730 Sleep apnea, unspecified: Secondary | ICD-10-CM | POA: Diagnosis not present

## 2022-12-10 DIAGNOSIS — M1990 Unspecified osteoarthritis, unspecified site: Secondary | ICD-10-CM | POA: Diagnosis not present

## 2022-12-10 DIAGNOSIS — R1084 Generalized abdominal pain: Secondary | ICD-10-CM | POA: Diagnosis not present

## 2022-12-10 DIAGNOSIS — Z87891 Personal history of nicotine dependence: Secondary | ICD-10-CM | POA: Diagnosis not present

## 2022-12-10 DIAGNOSIS — Z03818 Encounter for observation for suspected exposure to other biological agents ruled out: Secondary | ICD-10-CM | POA: Diagnosis not present

## 2022-12-10 DIAGNOSIS — R918 Other nonspecific abnormal finding of lung field: Secondary | ICD-10-CM | POA: Diagnosis not present

## 2022-12-11 DIAGNOSIS — I517 Cardiomegaly: Secondary | ICD-10-CM | POA: Diagnosis not present

## 2022-12-11 DIAGNOSIS — E785 Hyperlipidemia, unspecified: Secondary | ICD-10-CM | POA: Diagnosis not present

## 2022-12-11 DIAGNOSIS — R072 Precordial pain: Secondary | ICD-10-CM | POA: Diagnosis not present

## 2022-12-11 DIAGNOSIS — I10 Essential (primary) hypertension: Secondary | ICD-10-CM | POA: Diagnosis not present

## 2022-12-11 DIAGNOSIS — R109 Unspecified abdominal pain: Secondary | ICD-10-CM | POA: Diagnosis not present

## 2022-12-11 DIAGNOSIS — I519 Heart disease, unspecified: Secondary | ICD-10-CM | POA: Diagnosis not present

## 2022-12-11 DIAGNOSIS — R079 Chest pain, unspecified: Secondary | ICD-10-CM | POA: Diagnosis not present

## 2022-12-11 DIAGNOSIS — R1010 Upper abdominal pain, unspecified: Secondary | ICD-10-CM | POA: Diagnosis not present

## 2022-12-12 DIAGNOSIS — E785 Hyperlipidemia, unspecified: Secondary | ICD-10-CM | POA: Diagnosis not present

## 2022-12-12 DIAGNOSIS — R079 Chest pain, unspecified: Secondary | ICD-10-CM | POA: Diagnosis not present

## 2022-12-12 DIAGNOSIS — I10 Essential (primary) hypertension: Secondary | ICD-10-CM | POA: Diagnosis not present

## 2022-12-12 DIAGNOSIS — I2510 Atherosclerotic heart disease of native coronary artery without angina pectoris: Secondary | ICD-10-CM | POA: Diagnosis not present

## 2022-12-13 DIAGNOSIS — I2510 Atherosclerotic heart disease of native coronary artery without angina pectoris: Secondary | ICD-10-CM | POA: Diagnosis not present

## 2022-12-13 DIAGNOSIS — R079 Chest pain, unspecified: Secondary | ICD-10-CM | POA: Diagnosis not present

## 2022-12-13 DIAGNOSIS — I493 Ventricular premature depolarization: Secondary | ICD-10-CM | POA: Diagnosis not present

## 2022-12-13 DIAGNOSIS — I10 Essential (primary) hypertension: Secondary | ICD-10-CM | POA: Diagnosis not present

## 2022-12-18 ENCOUNTER — Telehealth: Payer: Self-pay | Admitting: Cardiology

## 2022-12-18 NOTE — Telephone Encounter (Signed)
Pt states that she had heart cath done at [facility] they found some blockages. She reports that they want to treat her medically.

## 2022-12-18 NOTE — Telephone Encounter (Signed)
Pt called in stating she was in [facility] (notes are in Care everywhere). She states they told to call and get a f/u appt in 1 week and if we can't get her in any sooner then she needs to have her labs drawn. Please advise. She is scheduled fo r9/13 with Dr. Wyline Mood. I do not see any sooner availability at the moment

## 2022-12-18 NOTE — Telephone Encounter (Signed)
Pt notified of appt on Aug 13 at 2:20 pm. Pt plans to attend.

## 2022-12-21 ENCOUNTER — Inpatient Hospital Stay: Payer: 59 | Admitting: Family Medicine

## 2022-12-26 ENCOUNTER — Encounter: Payer: Self-pay | Admitting: Cardiology

## 2022-12-26 ENCOUNTER — Ambulatory Visit: Payer: 59 | Attending: Cardiology | Admitting: Cardiology

## 2022-12-26 VITALS — BP 130/70 | Ht 60.0 in | Wt 193.0 lb

## 2022-12-26 DIAGNOSIS — I10 Essential (primary) hypertension: Secondary | ICD-10-CM

## 2022-12-26 DIAGNOSIS — R079 Chest pain, unspecified: Secondary | ICD-10-CM | POA: Diagnosis not present

## 2022-12-26 DIAGNOSIS — I2510 Atherosclerotic heart disease of native coronary artery without angina pectoris: Secondary | ICD-10-CM

## 2022-12-26 DIAGNOSIS — E782 Mixed hyperlipidemia: Secondary | ICD-10-CM

## 2022-12-26 NOTE — Progress Notes (Signed)
Clinical Summary Ms. Boyers is a 82 y.o.female seen today for follow up of the following medical problems.   1.HTN - compliant with meds  2.History of chest pain/DOE 12/2021 echo: LVEF 60-65%, no WMAs, grade II dd - 03/2022 nuclear stress: very small area of ishchemia mid anterior wall, low risk  -admit [facility] 11/2022 with chest pain, DOE.  11/2022 echo [facility]: LVEF 55-65%, grade I dd, normal RV, severe LAE 11/2022 nuclear stress [facility]: There are mild, reversible perfusion defects in the anterolateral and inferolateral basal wall. Myocardial ischemia cannot be excluded.  11/2022 cath [facility]: LM normal, LAD 10% mid, LCX 20%, RCA normal  - no recurrent chest pain since discharge, has done well    3.PVCs - noted during 11/2022 admission at [facility] - denies any recent palpitations.   4.HLD - from [facility] discharge was to start repatha - taking repatha 11/2022 TC 202 TG 70 LDL 121 HDL 67, this was prior to repatha. Upcomign labs with pcp  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   Osteoporosis    Vitamin B12 deficiency 12/06/2017     Allergies  Allergen Reactions   Iodinated Contrast Media Anaphylaxis   Diphenhydramine Other (See Comments)    Patient states "feels like somebody is inside of me clawing me out"   Codeine Other (See Comments)    Hallucinations   Statins Other (See Comments)    Joint pains and weakness. Joint pains and weakness. Joint pains and weakness.   Zetia [Ezetimibe] Other (See Comments)    Aching in arms and legs.     Current Outpatient Medications  Medication Sig Dispense Refill   acetaminophen (TYLENOL) 500 MG tablet Take 1,000 mg by mouth every 8 (eight) hours as needed for headache or moderate pain. Pt takes 500 mg 2 tablets as needed.     albuterol (VENTOLIN HFA) 108 (90 Base) MCG/ACT inhaler Inhale 2 puffs into the lungs every 6 (six) hours as needed  for wheezing or shortness of breath. 18 g 0   alendronate (FOSAMAX) 70 MG tablet TAKE ONE TABLET  EVERY 7 DAYS. TAKE WITH A FULL GLASS OF WATER, ON AN EMPTY STOMACH. 12 tablet 1   amLODipine (NORVASC) 10 MG tablet Take 1 tablet (10 mg total) by mouth daily. 90 tablet 3   CALCIUM PO Take 1 tablet by mouth daily.     celecoxib (CELEBREX) 200 MG capsule TAKE (1) CAPSULE DAILY. 30 capsule 0   clobetasol ointment (TEMOVATE) 0.05 % Pea-sized amount as instructed- every other night x 2 weeks then 2-3x per week x 2 weeks 30 g 0   esomeprazole (NEXIUM) 40 MG capsule TAKE (1) CAPSULE DAILY. 30 capsule 3   fluticasone (FLONASE) 50 MCG/ACT nasal spray Place 2 sprays into both nostrils daily. 16 g 6   furosemide (LASIX) 20 MG tablet TAKE TWO TABLETS BY MOUTH DAILY FOR ONE WEEK, THEN TAKE ONE TABLET AS NEEDED for lower extremity swelling. 90 tablet 3   isosorbide mononitrate (IMDUR) 30 MG 24 hr tablet TAKE 1/2 TABLET BY MOUTH EVERY DAY 45 tablet 3   levocetirizine (XYZAL) 5 MG tablet TAKE 1 TABLET BY MOUTH EVERY DAY 90 tablet 0   lisinopril (ZESTRIL) 40 MG tablet Take 1 tablet (40 mg total) by mouth daily. 30 tablet 5   nystatin cream (MYCOSTATIN) Apply 1-2x daily for 7 days then as needed 30 g 0   potassium chloride (KLOR-CON)  10 MEQ tablet TAKE ONE TABLET TWICE A DAY. 60 tablet 0   VITAMIN D PO Take 1 capsule by mouth daily.     No current facility-administered medications for this visit.     Past Surgical History:  Procedure Laterality Date   TOTAL HIP ARTHROPLASTY Right    TOTAL KNEE ARTHROPLASTY Bilateral    TUBAL LIGATION       Allergies  Allergen Reactions   Iodinated Contrast Media Anaphylaxis   Diphenhydramine Other (See Comments)    Patient states "feels like somebody is inside of me clawing me out"   Codeine Other (See Comments)    Hallucinations   Statins Other (See Comments)    Joint pains and weakness. Joint pains and weakness. Joint pains and weakness.   Zetia [Ezetimibe]  Other (See Comments)    Aching in arms and legs.      Family History  Problem Relation Age of Onset   Breast cancer Mother      Social History Ms. Guster reports that she has never smoked. She has never used smokeless tobacco. Ms. Malady reports no history of alcohol use.   Review of Systems CONSTITUTIONAL: No weight loss, fever, chills, weakness or fatigue.  HEENT: Eyes: No visual loss, blurred vision, double vision or yellow sclerae.No hearing loss, sneezing, congestion, runny nose or sore throat.  SKIN: No rash or itching.  CARDIOVASCULAR: per hpi RESPIRATORY: No shortness of breath, cough or sputum.  GASTROINTESTINAL: No anorexia, nausea, vomiting or diarrhea. No abdominal pain or blood.  GENITOURINARY: No burning on urination, no polyuria NEUROLOGICAL: No headache, dizziness, syncope, paralysis, ataxia, numbness or tingling in the extremities. No change in bowel or bladder control.  MUSCULOSKELETAL: No muscle, back pain, joint pain or stiffness.  LYMPHATICS: No enlarged nodes. No history of splenectomy.  PSYCHIATRIC: No history of depression or anxiety.  ENDOCRINOLOGIC: No reports of sweating, cold or heat intolerance. No polyuria or polydipsia.  [name]   Physical Examination Today's Vitals   12/26/22 1420  BP: 130/70  SpO2: 98%  Weight: 193 lb (87.5 kg)  Height: 5' (1.524 m)   Body mass index is 37.69 kg/m.  Gen: resting comfortably, no acute distress HEENT: no scleral icterus, pupils equal round and reactive, no palptable cervical adenopathy,  CV: RRR, no m/rg, no jvd Resp: Clear to auscultation bilaterally GI: abdomen is soft, non-tender, non-distended, normal bowel sounds, no hepatosplenomegaly MSK: extremities are warm, no edema.  Skin: warm, no rash Neuro:  no focal deficits Psych: appropriate affect   Diagnostic Studies  11/2022 cath [facility] DIAGNOSTIC CORONARY ANGIOGRAPHY:  LM: short segment large vessel that bifurcates into LAD and LCX.   Angiographically normal.  LAD: large caliber vessel that has small caliber diagonal branches and  wraps around the apex. 10% tubular eccentric stenosis in the mid portion.    LCx: large caliber vessel that gives off multiple medium caliber OM  branches. 20% discrete stenosis in the proximal segment  RCA: dominant vessel. large caliber tortuous vessel that branches into  r-PL and r-PDA branches. Angiographically normal.   COMPLICATIONS:  None   CONCLUSIONS/RECOMMENDATIONS:  Non-obstructive CAD. Maximize medical therapy for secondary prevention.   11/2022 echo [facility] Left Ventricle: Left ventricular wall thickness is mildly increased.  Concentric left ventricle hypertrophy is present. Left ventricular  ejection fraction is normal. The visually estimated ejection fraction is  55-65%. Grade I diastolic dysfunction (impaired relaxation filling  pattern) appears present.    Right Ventricle: RV systolic function is normal. The TAPSE is 2.40  cm.    Left Atrium: Left atrium is severely dilated.    Tricuspid Valve: Unable to estimate RV systolic pressure.    IVC/SVC: The inferior vena cava demonstrates a diameter of <=21 mm and  collapses >50%; therefore, the right atrial pressure is estimated at 0-5  mmHg.   No significant valvular disease    No prior echo for comparison    Assessment and Plan   1. HTN - at goal, continue current meds  2. History of chest pain/Mild CAD - prior workup as outlined above at [facility] overall benign, very mild CAD by cath - no recent symptoms, continue to monitor  3. PVCs - no symptoms, continue to monitor  4. HLD - continue repatha, f/u pcp labs    Antoine Poche, M.D.

## 2022-12-26 NOTE — Patient Instructions (Signed)
Medication Instructions:  Your physician recommends that you continue on your current medications as directed. Please refer to the Current Medication list given to you today.  *If you need a refill on your cardiac medications before your next appointment, please call your pharmacy*   Lab Work: None If you have labs (blood work) drawn today and your tests are completely normal, you will receive your results only by: MyChart Message (if you have MyChart) OR A paper copy in the mail If you have any lab test that is abnormal or we need to change your treatment, we will call you to review the results.   Testing/Procedures: None   Follow-Up: At [facility], you and your health needs are our priority.  As part of our continuing mission to provide you with exceptional heart care, we have created designated Provider Care Teams.  These Care Teams include your primary Cardiologist (physician) and Advanced Practice Providers (APPs -  Physician Assistants and Nurse Practitioners) who all work together to provide you with the care you need, when you need it.  We recommend signing up for the patient portal called "MyChart".  Sign up information is provided on this After Visit Summary.  MyChart is used to connect with patients for Virtual Visits (Telemedicine).  Patients are able to view lab/test results, encounter notes, upcoming appointments, etc.  Non-urgent messages can be sent to your provider as well.   To learn more about what you can do with MyChart, go to [email or web address].    Your next appointment:   6 month(s)  Provider:   Jonathan Branch, MD    Other Instructions    

## 2022-12-28 ENCOUNTER — Inpatient Hospital Stay: Payer: 59 | Admitting: Family Medicine

## 2023-01-08 ENCOUNTER — Telehealth: Payer: Self-pay | Admitting: Family Medicine

## 2023-01-08 ENCOUNTER — Other Ambulatory Visit: Payer: Self-pay | Admitting: Family Medicine

## 2023-01-08 DIAGNOSIS — I10 Essential (primary) hypertension: Secondary | ICD-10-CM

## 2023-01-08 DIAGNOSIS — K219 Gastro-esophageal reflux disease without esophagitis: Secondary | ICD-10-CM

## 2023-01-08 MED ORDER — AMLODIPINE BESYLATE 10 MG PO TABS
10.0000 mg | ORAL_TABLET | Freq: Every day | ORAL | 0 refills | Status: DC
Start: 2023-01-08 — End: 2023-06-13

## 2023-01-08 MED ORDER — LISINOPRIL 40 MG PO TABS
40.0000 mg | ORAL_TABLET | Freq: Every day | ORAL | 0 refills | Status: DC
Start: 2023-01-08 — End: 2023-02-05

## 2023-01-08 MED ORDER — ESOMEPRAZOLE MAGNESIUM 40 MG PO CPDR
DELAYED_RELEASE_CAPSULE | ORAL | 0 refills | Status: DC
Start: 2023-01-08 — End: 2023-02-05

## 2023-01-08 NOTE — Telephone Encounter (Signed)
  Prescription Request  01/08/2023  Is this a "Controlled Substance" medicine? no  Have you seen your PCP in the last 2 weeks? no  If YES, route message to pool  -  If NO, patient needs to be scheduled for appointment.  What is the name of the medication or equipment? Amlodipine 10 mg, Lisinopril 40 mg, Esomeprazole 40 mg. Patient is in [place] with her son and has been there all summer due to him being sick and needs medications  Have you contacted your pharmacy to request a refill? YES   Which pharmacy would you like this sent to? [facility] in [place], [place] P# [phone]   Patient notified that their request is being sent to the clinical staff for review and that they should receive a response within 2 business days.

## 2023-01-26 ENCOUNTER — Ambulatory Visit: Payer: 59 | Admitting: Cardiology

## 2023-02-03 ENCOUNTER — Other Ambulatory Visit: Payer: Self-pay | Admitting: Family Medicine

## 2023-02-03 DIAGNOSIS — K219 Gastro-esophageal reflux disease without esophagitis: Secondary | ICD-10-CM

## 2023-02-03 DIAGNOSIS — I10 Essential (primary) hypertension: Secondary | ICD-10-CM

## 2023-02-05 ENCOUNTER — Other Ambulatory Visit: Payer: Self-pay | Admitting: Family Medicine

## 2023-02-05 DIAGNOSIS — I10 Essential (primary) hypertension: Secondary | ICD-10-CM

## 2023-02-27 ENCOUNTER — Other Ambulatory Visit: Payer: Self-pay | Admitting: Family Medicine

## 2023-02-27 DIAGNOSIS — K219 Gastro-esophageal reflux disease without esophagitis: Secondary | ICD-10-CM

## 2023-02-27 DIAGNOSIS — I10 Essential (primary) hypertension: Secondary | ICD-10-CM

## 2023-03-07 ENCOUNTER — Telehealth: Payer: Self-pay | Admitting: Family Medicine

## 2023-03-07 DIAGNOSIS — I10 Essential (primary) hypertension: Secondary | ICD-10-CM

## 2023-03-07 MED ORDER — LISINOPRIL 40 MG PO TABS
40.0000 mg | ORAL_TABLET | Freq: Every day | ORAL | 0 refills | Status: DC
Start: 2023-03-07 — End: 2023-05-01

## 2023-03-07 NOTE — Telephone Encounter (Signed)
  Prescription Request  03/07/2023  Is this a "Controlled Substance" medicine? no  Have you seen your PCP in the last 2 weeks? no  If YES, route message to pool  -  If NO, patient needs to be scheduled for appointment.  What is the name of the medication or equipment? Lisinopril 40 mg- Patient has appt 10-30 and needs some to get her through  Have you contacted your pharmacy to request a refill? NO   Which pharmacy would you like this sent to? [facility]   Patient notified that their request is being sent to the clinical staff for review and that they should receive a response within 2 business days.

## 2023-03-07 NOTE — Telephone Encounter (Signed)
Aware refill sent to pharmacy ?

## 2023-03-14 ENCOUNTER — Ambulatory Visit: Payer: 59 | Admitting: Family Medicine

## 2023-03-21 ENCOUNTER — Ambulatory Visit: Payer: 59 | Admitting: Family Medicine

## 2023-03-21 ENCOUNTER — Encounter: Payer: Self-pay | Admitting: Family Medicine

## 2023-03-21 VITALS — BP 114/61 | HR 72 | Temp 98.4°F | Ht 60.0 in | Wt 193.1 lb

## 2023-03-21 DIAGNOSIS — R7989 Other specified abnormal findings of blood chemistry: Secondary | ICD-10-CM

## 2023-03-21 DIAGNOSIS — Z79899 Other long term (current) drug therapy: Secondary | ICD-10-CM

## 2023-03-21 DIAGNOSIS — K219 Gastro-esophageal reflux disease without esophagitis: Secondary | ICD-10-CM

## 2023-03-21 DIAGNOSIS — M150 Primary generalized (osteo)arthritis: Secondary | ICD-10-CM | POA: Diagnosis not present

## 2023-03-21 DIAGNOSIS — I10 Essential (primary) hypertension: Secondary | ICD-10-CM | POA: Diagnosis not present

## 2023-03-21 DIAGNOSIS — M51362 Other intervertebral disc degeneration, lumbar region with discogenic back pain and lower extremity pain: Secondary | ICD-10-CM | POA: Diagnosis not present

## 2023-03-21 DIAGNOSIS — I2510 Atherosclerotic heart disease of native coronary artery without angina pectoris: Secondary | ICD-10-CM | POA: Diagnosis not present

## 2023-03-21 DIAGNOSIS — E782 Mixed hyperlipidemia: Secondary | ICD-10-CM | POA: Diagnosis not present

## 2023-03-21 MED ORDER — HYDROCODONE-ACETAMINOPHEN 5-325 MG PO TABS
1.0000 | ORAL_TABLET | Freq: Three times a day (TID) | ORAL | 0 refills | Status: DC | PRN
Start: 2023-03-21 — End: 2023-09-10

## 2023-03-21 MED ORDER — HYDROCODONE-ACETAMINOPHEN 5-325 MG PO TABS
1.0000 | ORAL_TABLET | Freq: Three times a day (TID) | ORAL | 0 refills | Status: DC | PRN
Start: 2023-04-20 — End: 2023-09-10

## 2023-03-21 MED ORDER — HYDROCODONE-ACETAMINOPHEN 5-325 MG PO TABS
1.0000 | ORAL_TABLET | Freq: Three times a day (TID) | ORAL | 0 refills | Status: DC | PRN
Start: 2023-05-21 — End: 2023-09-10

## 2023-03-21 MED ORDER — REPATHA SURECLICK 140 MG/ML ~~LOC~~ SOAJ
140.0000 mg | SUBCUTANEOUS | 11 refills | Status: DC
Start: 2023-03-21 — End: 2024-03-12

## 2023-03-21 NOTE — Progress Notes (Signed)
Established Patient Office Visit  Subjective   Patient ID: Melissa Zavala, female    DOB: 12-06-1940  Age: 82 y.o. MRN: [id number]  Chief Complaint  Patient presents with   Medical Management of Chronic Issues   Hypertension   Hyperlipidemia    HPI Robertine is here for a chronic follow up today. She returned from [place] 2 weeks ago. She was there providing care for her son while he was having some health difficulties. She also was admitted and underwent a heart cath while she was there.  HTN Complaint with meds - Yes Current Medications - amlodipine 10 mg, lasix 40 mg prn, lisinopril 40 mg,  Checking BP at home ranging 110-120/60-70s Pertinent ROS:  Headache - No Fatigue - No Visual Disturbances - No Chest pain - No Dyspnea - No Palpitations - No LE edema - mild, baseline  Had follow up appt with cardiology since her return.   2. HLD Was started on repatha by the hospital to help lower her cholesterol. She has been out for 1 month however.   3. GERD Reports well controlled with medication.  4. Pain assessment: She has been out of her pain medicine for awhile since she was in [place]. She did get one fill while she was in the hospital and used it sparingly. She would like to restart this as she is having significant pain without regular use.   Cause of pain- arthritis, DDD Pain location- legs & back Pain on scale of 1-10- 8-9/10 without medication; 5/10 with medication Frequency- comes and goes What increases pain- prolonged standing What makes pain better- rest and medication Effects on ADL- difficulty completing yard and house work Any change in general medical condition- none   Current opioids rx- Norco 5/325 mg Q8H PRN # meds rx- 90 Effectiveness of current meds- effective Adverse reactions form pain meds- none Morphine equivalent- 15 MME/day   Pill count performed-No Last drug screen - today ( high risk q82m, moderate risk q28m, low risk yearly ) Urine drug screen today-  No Was the NCCSR reviewed- Yes             If yes were their any concerning findings? - No   Overdose risk: 160  Pain contract signed on: updated today   ROS Negative unless specially indicated above in HPI.   Objective:     BP 114/61   Pulse 72   Temp 98.4 F (36.9 C) (Temporal)   Ht 5' (1.524 m)   Wt 193 lb 2 oz (87.6 kg)   SpO2 96%   BMI 37.72 kg/m  BP Readings from Last 3 Encounters:  03/21/23 114/61  12/26/22 130/70  11/21/22 136/64      Physical Exam Vitals and nursing note reviewed.  Constitutional:      General: She is not in acute distress.    Appearance: She is not ill-appearing, toxic-appearing or diaphoretic.  HENT:     Nose: Nose normal.     Mouth/Throat:     Mouth: Mucous membranes are moist.     Pharynx: Oropharynx is clear.  Eyes:     Extraocular Movements: Extraocular movements intact.     Conjunctiva/sclera: Conjunctivae normal.  Neck:     Thyroid: No thyroid mass, thyromegaly or thyroid tenderness.  Cardiovascular:     Rate and Rhythm: Normal rate and regular rhythm.     Heart sounds: Normal heart sounds. No murmur heard. Pulmonary:     Effort: Pulmonary effort is normal. No respiratory distress.  Breath sounds: Normal breath sounds.  Abdominal:     General: Bowel sounds are normal. There is no distension.     Palpations: Abdomen is soft.     Tenderness: There is no abdominal tenderness. There is no guarding or rebound.  Musculoskeletal:     Right lower leg: No edema.     Left lower leg: No edema.  Skin:    General: Skin is warm and dry.  Neurological:     General: No focal deficit present.     Mental Status: She is alert and oriented to person, place, and time.  Psychiatric:        Mood and Affect: Mood normal.        Behavior: Behavior normal.     No results found for any visits on 03/21/23.    The ASCVD Risk score (Arnett DK, et al., 2019) failed to calculate for the following reasons:   The 2019 ASCVD risk score is  only valid for ages 10 to 36    Assessment & Plan:   Tayia was seen today for medical management of chronic issues, hypertension and hyperlipidemia.  Diagnoses and all orders for this visit:  Primary hypertension Well controlled on current regimen.  -     CBC with Differential/Platelet -     CMP14+EGFR  Mixed hyperlipidemia Restart repatha. Sample given today until refills are available. Statin and zetia intolerant. -     Lipid panel -     Evolocumab (REPATHA SURECLICK) 140 MG/ML SOAJ; Inject 140 mg into the skin every 14 (fourteen) days.  Morbid obesity (HCC) BMI 37 with HTN, HLD. Diet, exercise, weight loss.   Coronary artery disease involving native coronary artery of native heart without angina pectoris Restart repatha. Recently saw cardiology for follow up.  -     Evolocumab (REPATHA SURECLICK) 140 MG/ML SOAJ; Inject 140 mg into the skin every 14 (fourteen) days.  Gastroesophageal reflux disease without esophagitis Well controlled on current regimen.   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 CSA and UDS updated today. Refills provided.  -     ToxASSURE Select 13 (MW), Urine -     HYDROcodone-acetaminophen (NORCO) 5-325 MG tablet; Take 1 tablet by mouth every 8 (eight) hours as needed for moderate pain (pain score 4-6). -     HYDROcodone-acetaminophen (NORCO) 5-325 MG tablet; Take 1 tablet by mouth every 8 (eight) hours as needed for moderate pain (pain score 4-6). -     HYDROcodone-acetaminophen (NORCO) 5-325 MG tablet; Take 1 tablet by mouth every 8 (eight) hours as needed for moderate pain (pain score 4-6).   Return in about 3 months (around 06/21/2023) for CPE.   The patient indicates understanding of these issues  and agrees with the plan.  Gabriel Earing, FNP

## 2023-03-22 LAB — CMP14+EGFR
ALT: 11 [IU]/L (ref 0–32)
AST: 17 [IU]/L (ref 0–40)
Albumin: 4.4 g/dL (ref 3.7–4.7)
Alkaline Phosphatase: 79 [IU]/L (ref 44–121)
BUN/Creatinine Ratio: 20 (ref 12–28)
BUN: 14 mg/dL (ref 8–27)
Bilirubin Total: 0.3 mg/dL (ref 0.0–1.2)
CO2: 23 mmol/L (ref 20–29)
Calcium: 10.1 mg/dL (ref 8.7–10.3)
Chloride: 101 mmol/L (ref 96–106)
Creatinine, Ser: 0.71 mg/dL (ref 0.57–1.00)
Globulin, Total: 2.5 g/dL (ref 1.5–4.5)
Glucose: 91 mg/dL (ref 70–99)
Potassium: 3.8 mmol/L (ref 3.5–5.2)
Sodium: 140 mmol/L (ref 134–144)
Total Protein: 6.9 g/dL (ref 6.0–8.5)
eGFR: 85 mL/min/{1.73_m2} (ref >59)

## 2023-03-22 LAB — CBC WITH DIFFERENTIAL/PLATELET
Basophils Absolute: 0 10*3/uL (ref 0.0–0.2)
Basos: 0 %
EOS (ABSOLUTE): 0.2 10*3/uL (ref 0.0–0.4)
Eos: 2 %
Hematocrit: 37.1 % (ref 34.0–46.6)
Hemoglobin: 11.3 g/dL (ref 11.1–15.9)
Immature Grans (Abs): 0 10*3/uL (ref 0.0–0.1)
Immature Granulocytes: 0 %
Lymphocytes Absolute: 2.5 10*3/uL (ref 0.7–3.1)
Lymphs: 35 %
MCH: 26.7 pg (ref 26.6–33.0)
MCHC: 30.5 g/dL — ABNORMAL LOW (ref 31.5–35.7)
MCV: 88 fL (ref 79–97)
Monocytes Absolute: 0.4 10*3/uL (ref 0.1–0.9)
Monocytes: 6 %
Neutrophils Absolute: 4.1 10*3/uL (ref 1.4–7.0)
Neutrophils: 57 %
Platelets: 270 10*3/uL (ref 150–450)
RBC: 4.24 x10E6/uL (ref 3.77–5.28)
RDW: 12.9 % (ref 11.7–15.4)
WBC: 7.2 10*3/uL (ref 3.4–10.8)

## 2023-03-22 LAB — LIPID PANEL
Chol/HDL Ratio: 2.3 ratio (ref 0.0–4.4)
Cholesterol, Total: 194 mg/dL (ref 100–199)
HDL: 86 mg/dL (ref >39)
LDL Chol Calc (NIH): 90 mg/dL (ref 0–99)
Triglycerides: 102 mg/dL (ref 0–149)
VLDL Cholesterol Cal: 18 mg/dL (ref 5–40)

## 2023-03-22 LAB — THYROID PANEL WITH TSH
Free Thyroxine Index: 2.1 (ref 1.2–4.9)
T3 Uptake Ratio: 34 % (ref 24–39)
T4, Total: 6.1 ug/dL (ref 4.5–12.0)
TSH: 0.597 u[IU]/mL (ref 0.450–4.500)

## 2023-03-22 LAB — THYROID PEROXIDASE ANTIBODY: Thyroperoxidase Ab SerPl-aCnc: 14 [IU]/mL (ref 0–34)

## 2023-03-24 LAB — TOXASSURE SELECT 13 (MW), URINE

## 2023-03-27 ENCOUNTER — Encounter: Payer: Self-pay | Admitting: *Deleted

## 2023-04-18 ENCOUNTER — Telehealth: Payer: Self-pay

## 2023-04-18 NOTE — Telephone Encounter (Signed)
Copied from CRM [id number]. Topic: Clinical - Medication Refill >> Apr 18, 2023  1:52 PM Fuller Mandril wrote: Most Recent Primary Care Visit:  Provider: Gabriel Earing  Department: [facility]  Visit Type: OFFICE VISIT  Date: 03/21/2023  Medication: Pt is currently out of the Rx listed below but called to state she has to go out of town to be with her son for a few months and wants to make sure she had enough refills to last on medication that was able to be refilled and that when its time to get her pain medicine if it cal be ordered for the [facility] [place]. Medications below can be filled at [facility] prior to her leaving on Tuesday.  sosorbide mononitrate (IMDUR) 30 MG 24 hr tablet  esomeprazole (NEXIUM) 40 MG capsule   Has the patient contacted their pharmacy? No (Agent: If no, request that the patient contact the pharmacy for the refill. If patient does not wish to contact the pharmacy document the reason why and proceed with request.) (Agent: If yes, when and what did the pharmacy advise?)  Is this the correct pharmacy for this prescription? Yes If no, delete pharmacy and type the correct one.  This is the patient's preferred pharmacy:  [facility] [place], [place] - [address] [place] [address] Phone: [phone] Fax: [phone]  [id number] - [place], [place] - [address] AT [facility] [address] [place] [address] Phone: [phone] Fax: [phone]   Has the prescription been filled recently? No  Is the patient out of the medication? Yes  Has the patient been seen for an appointment in the last year OR does the patient have an upcoming appointment? Yes  Can we respond through MyChart? No  Agent: Please be advised that Rx refills may take up to 3 business days. We ask that you follow-up with your pharmacy.

## 2023-05-01 ENCOUNTER — Other Ambulatory Visit: Payer: Self-pay | Admitting: Family Medicine

## 2023-05-01 DIAGNOSIS — I10 Essential (primary) hypertension: Secondary | ICD-10-CM

## 2023-05-01 DIAGNOSIS — M549 Dorsalgia, unspecified: Secondary | ICD-10-CM | POA: Diagnosis not present

## 2023-05-01 NOTE — Telephone Encounter (Signed)
Copied from CRM [id number]. Topic: Clinical - Medication Refill >> May 01, 2023 10:04 AM Lorin Glass B wrote: Most Recent Primary Care Visit:  Provider: Gabriel Earing  Department: [facility]  Visit Type: OFFICE VISIT  Date: 03/21/2023  Medication: lisinopril (ZESTRIL) 40 MG tablet  Has the patient contacted their pharmacy? Yes (Agent: If no, request that the patient contact the pharmacy for the refill. If patient does not wish to contact the pharmacy document the reason why and proceed with request.) (Agent: If yes, when and what did the pharmacy advise?)  Is this the correct pharmacy for this prescription? Yes If no, delete pharmacy and type the correct one.  This is the patient's preferred pharmacy:   [id number] - [place], [place] - [address] AT [facility] [address] [place] [address] Phone: [phone] Fax: [phone]   Has the prescription been filled recently? Yes  Is the patient out of the medication? Yes  Has the patient been seen for an appointment in the last year OR does the patient have an upcoming appointment? Yes  Can we respond through MyChart?   Agent: Please be advised that Rx refills may take up to 3 business days. We ask that you follow-up with your pharmacy.

## 2023-06-13 ENCOUNTER — Other Ambulatory Visit: Payer: Self-pay | Admitting: Family Medicine

## 2023-06-13 DIAGNOSIS — I10 Essential (primary) hypertension: Secondary | ICD-10-CM

## 2023-06-22 DIAGNOSIS — R0989 Other specified symptoms and signs involving the circulatory and respiratory systems: Secondary | ICD-10-CM | POA: Diagnosis not present

## 2023-06-22 DIAGNOSIS — M549 Dorsalgia, unspecified: Secondary | ICD-10-CM | POA: Diagnosis not present

## 2023-06-22 DIAGNOSIS — J0100 Acute maxillary sinusitis, unspecified: Secondary | ICD-10-CM | POA: Diagnosis not present

## 2023-06-22 DIAGNOSIS — R051 Acute cough: Secondary | ICD-10-CM | POA: Diagnosis not present

## 2023-06-22 DIAGNOSIS — I517 Cardiomegaly: Secondary | ICD-10-CM | POA: Diagnosis not present

## 2023-06-29 ENCOUNTER — Ambulatory Visit: Payer: 59 | Admitting: Cardiology

## 2023-07-02 ENCOUNTER — Encounter: Payer: 59 | Admitting: Family Medicine

## 2023-07-05 ENCOUNTER — Encounter: Payer: 59 | Admitting: Family Medicine

## 2023-07-07 DIAGNOSIS — J029 Acute pharyngitis, unspecified: Secondary | ICD-10-CM | POA: Diagnosis not present

## 2023-07-07 DIAGNOSIS — R0789 Other chest pain: Secondary | ICD-10-CM | POA: Diagnosis not present

## 2023-07-07 DIAGNOSIS — R9431 Abnormal electrocardiogram [ECG] [EKG]: Secondary | ICD-10-CM | POA: Diagnosis not present

## 2023-07-08 DIAGNOSIS — I3481 Nonrheumatic mitral (valve) annulus calcification: Secondary | ICD-10-CM | POA: Diagnosis not present

## 2023-07-08 DIAGNOSIS — R9431 Abnormal electrocardiogram [ECG] [EKG]: Secondary | ICD-10-CM | POA: Diagnosis not present

## 2023-07-08 DIAGNOSIS — Z20822 Contact with and (suspected) exposure to covid-19: Secondary | ICD-10-CM | POA: Diagnosis not present

## 2023-07-08 DIAGNOSIS — R1011 Right upper quadrant pain: Secondary | ICD-10-CM | POA: Diagnosis not present

## 2023-07-08 DIAGNOSIS — D649 Anemia, unspecified: Secondary | ICD-10-CM | POA: Diagnosis not present

## 2023-07-08 DIAGNOSIS — R052 Subacute cough: Secondary | ICD-10-CM | POA: Diagnosis not present

## 2023-07-08 DIAGNOSIS — R5381 Other malaise: Secondary | ICD-10-CM | POA: Diagnosis not present

## 2023-07-08 DIAGNOSIS — I110 Hypertensive heart disease with heart failure: Secondary | ICD-10-CM | POA: Diagnosis not present

## 2023-07-08 DIAGNOSIS — G4733 Obstructive sleep apnea (adult) (pediatric): Secondary | ICD-10-CM | POA: Diagnosis not present

## 2023-07-08 DIAGNOSIS — E538 Deficiency of other specified B group vitamins: Secondary | ICD-10-CM | POA: Diagnosis not present

## 2023-07-08 DIAGNOSIS — I5032 Chronic diastolic (congestive) heart failure: Secondary | ICD-10-CM | POA: Diagnosis not present

## 2023-07-08 DIAGNOSIS — I5A Non-ischemic myocardial injury (non-traumatic): Secondary | ICD-10-CM | POA: Diagnosis not present

## 2023-07-08 DIAGNOSIS — I2510 Atherosclerotic heart disease of native coronary artery without angina pectoris: Secondary | ICD-10-CM | POA: Diagnosis not present

## 2023-07-08 DIAGNOSIS — R0602 Shortness of breath: Secondary | ICD-10-CM | POA: Diagnosis not present

## 2023-07-08 DIAGNOSIS — Z7982 Long term (current) use of aspirin: Secondary | ICD-10-CM | POA: Diagnosis not present

## 2023-07-08 DIAGNOSIS — R059 Cough, unspecified: Secondary | ICD-10-CM | POA: Diagnosis not present

## 2023-07-08 DIAGNOSIS — Z79899 Other long term (current) drug therapy: Secondary | ICD-10-CM | POA: Diagnosis not present

## 2023-07-08 DIAGNOSIS — E785 Hyperlipidemia, unspecified: Secondary | ICD-10-CM | POA: Diagnosis not present

## 2023-07-08 DIAGNOSIS — R072 Precordial pain: Secondary | ICD-10-CM | POA: Diagnosis not present

## 2023-07-08 DIAGNOSIS — K219 Gastro-esophageal reflux disease without esophagitis: Secondary | ICD-10-CM | POA: Diagnosis not present

## 2023-07-08 DIAGNOSIS — Z87891 Personal history of nicotine dependence: Secondary | ICD-10-CM | POA: Diagnosis not present

## 2023-07-09 DIAGNOSIS — I110 Hypertensive heart disease with heart failure: Secondary | ICD-10-CM | POA: Diagnosis not present

## 2023-07-09 DIAGNOSIS — R072 Precordial pain: Secondary | ICD-10-CM | POA: Diagnosis not present

## 2023-07-09 DIAGNOSIS — I5032 Chronic diastolic (congestive) heart failure: Secondary | ICD-10-CM | POA: Diagnosis not present

## 2023-07-09 DIAGNOSIS — Z87891 Personal history of nicotine dependence: Secondary | ICD-10-CM | POA: Diagnosis not present

## 2023-07-09 DIAGNOSIS — R1011 Right upper quadrant pain: Secondary | ICD-10-CM | POA: Diagnosis not present

## 2023-07-09 DIAGNOSIS — I5A Non-ischemic myocardial injury (non-traumatic): Secondary | ICD-10-CM | POA: Diagnosis not present

## 2023-07-09 DIAGNOSIS — I25119 Atherosclerotic heart disease of native coronary artery with unspecified angina pectoris: Secondary | ICD-10-CM | POA: Diagnosis not present

## 2023-07-10 DIAGNOSIS — I3481 Nonrheumatic mitral (valve) annulus calcification: Secondary | ICD-10-CM | POA: Diagnosis not present

## 2023-07-10 DIAGNOSIS — I5032 Chronic diastolic (congestive) heart failure: Secondary | ICD-10-CM | POA: Diagnosis not present

## 2023-07-10 DIAGNOSIS — I110 Hypertensive heart disease with heart failure: Secondary | ICD-10-CM | POA: Diagnosis not present

## 2023-07-10 DIAGNOSIS — I25119 Atherosclerotic heart disease of native coronary artery with unspecified angina pectoris: Secondary | ICD-10-CM | POA: Diagnosis not present

## 2023-07-10 DIAGNOSIS — I361 Nonrheumatic tricuspid (valve) insufficiency: Secondary | ICD-10-CM | POA: Diagnosis not present

## 2023-07-10 DIAGNOSIS — I5A Non-ischemic myocardial injury (non-traumatic): Secondary | ICD-10-CM | POA: Diagnosis not present

## 2023-07-11 ENCOUNTER — Other Ambulatory Visit: Payer: Self-pay | Admitting: Family Medicine

## 2023-07-11 DIAGNOSIS — I10 Essential (primary) hypertension: Secondary | ICD-10-CM

## 2023-07-11 DIAGNOSIS — K219 Gastro-esophageal reflux disease without esophagitis: Secondary | ICD-10-CM | POA: Diagnosis not present

## 2023-07-11 DIAGNOSIS — I5032 Chronic diastolic (congestive) heart failure: Secondary | ICD-10-CM | POA: Diagnosis not present

## 2023-07-11 DIAGNOSIS — E538 Deficiency of other specified B group vitamins: Secondary | ICD-10-CM | POA: Diagnosis not present

## 2023-07-11 DIAGNOSIS — I110 Hypertensive heart disease with heart failure: Secondary | ICD-10-CM | POA: Diagnosis not present

## 2023-07-11 DIAGNOSIS — G4733 Obstructive sleep apnea (adult) (pediatric): Secondary | ICD-10-CM | POA: Diagnosis not present

## 2023-07-12 ENCOUNTER — Encounter: Payer: Self-pay | Admitting: Family Medicine

## 2023-07-12 NOTE — Telephone Encounter (Signed)
[name] NTBS in May for 6 mos FU RF sent to pharmacy 

## 2023-07-12 NOTE — Telephone Encounter (Signed)
 Letter sent.

## 2023-07-27 ENCOUNTER — Other Ambulatory Visit: Payer: Self-pay | Admitting: Family Medicine

## 2023-07-27 ENCOUNTER — Encounter: Payer: Self-pay | Admitting: Family Medicine

## 2023-07-27 DIAGNOSIS — I10 Essential (primary) hypertension: Secondary | ICD-10-CM

## 2023-07-27 NOTE — Telephone Encounter (Signed)
 30 day supply sent in, needs to be seen for further refills. Please schedule with PCP.

## 2023-07-27 NOTE — Telephone Encounter (Signed)
Lmtcb letter mailed

## 2023-08-03 ENCOUNTER — Telehealth: Payer: Self-pay | Admitting: Family Medicine

## 2023-08-03 ENCOUNTER — Ambulatory Visit: Payer: Self-pay

## 2023-08-03 ENCOUNTER — Ambulatory Visit: Admitting: Family Medicine

## 2023-08-03 NOTE — Telephone Encounter (Signed)
 Left detailed message on son Dexters voicemail, making him aware that narcotics can't be prescribed outside of an "in person" office visit and providers are not able to do a video visit for this reason and also because patient is in [place] currently, and providers can only give service within [place].

## 2023-08-03 NOTE — Telephone Encounter (Signed)
 Copied from CRM [id number]. Topic: Clinical - Red Word Triage >> Aug 03, 2023  1:09 PM Everette C wrote: [facility] that prompted transfer to Nurse Triage: The patient's son has called to share that the patient is continuing to experience significant discomfort post hospitalization and would like to be prescribed medication (possibly oxyCODONE-acetaminophen (PERCOCET/ROXICET) 5-325 MG per tablet 1 tablet   [[id number]]) for their discomfort   Chief Complaint: Low back pain. Currently with son in [place]. Son requests her oxycodone be sent to [facility] [address] [place], [place]. [address] [phone]. Symptoms: Above Frequency: "It's been hurting her for a long time." Pertinent Negatives: Patient denies  Disposition: [] ED /[] Urgent Care (no appt availability in office) / [] Appointment(In office/virtual)/ []  [facility]/ [] Home Care/ [x] Refused Recommended Disposition /[] [facility]/ []  Follow-up with PCP Additional Notes: Please advise son.  Reason for Disposition  [1] MODERATE back pain (e.g., interferes with normal activities) AND [2] present > 3 days  Answer Assessment - Initial Assessment Questions 1. ONSET: "When did the pain begin?"      For awhile 2. LOCATION: "Where does it hurt?" (upper, mid or lower back)     Lower 3. SEVERITY: "How bad is the pain?"  (e.g., Scale 1-10; mild, moderate, or severe)   - MILD (1-3): Doesn't interfere with normal activities.    - MODERATE (4-7): Interferes with normal activities or awakens from sleep.    - SEVERE (8-10): Excruciating pain, unable to do any normal activities.      Severe 4. PATTERN: "Is the pain constant?" (e.g., yes, no; constant, intermittent)      Comes and goes 5. RADIATION: "Does the pain shoot into your legs or somewhere else?"     No Arthritis 6. CAUSE:  "What do you think is causing the back pain?"      Arthritis 7. BACK OVERUSE:  "Any recent lifting of heavy objects, strenuous work or  exercise?"     No 8. MEDICINES: "What have you taken so far for the pain?" (e.g., nothing, acetaminophen, NSAIDS)     OTC 9. NEUROLOGIC SYMPTOMS: "Do you have any weakness, numbness, or problems with bowel/bladder control?"     No 10. OTHER SYMPTOMS: "Do you have any other symptoms?" (e.g., fever, abdomen pain, burning with urination, blood in urine)       No 11. PREGNANCY: "Is there any chance you are pregnant?" "When was your last menstrual period?"       No  Protocols used: Back Pain-A-AH

## 2023-08-18 ENCOUNTER — Other Ambulatory Visit: Payer: Self-pay | Admitting: Family Medicine

## 2023-08-18 DIAGNOSIS — M810 Age-related osteoporosis without current pathological fracture: Secondary | ICD-10-CM

## 2023-08-20 NOTE — Telephone Encounter (Signed)
 Pt is in [place] with son. She will be back home end of April. Pt schedule an apt for 09/10/2023 for med refill.

## 2023-08-20 NOTE — Telephone Encounter (Signed)
[name] NTBS in May for 6 mos FU RF sent to pharmacy 

## 2023-08-26 ENCOUNTER — Other Ambulatory Visit: Payer: Self-pay | Admitting: Family Medicine

## 2023-08-26 DIAGNOSIS — I10 Essential (primary) hypertension: Secondary | ICD-10-CM

## 2023-08-28 IMAGING — MG MM DIGITAL DIAGNOSTIC UNILAT*L* W/ TOMO W/ CAD
3 series · 3 of 11 positions shown · non-contrast
Comparison: Previous exam(s).

CLINICAL DATA: 80-year-old female recalled from screening mammogram
dated 08/08/2021 for a possible left breast asymmetry.

EXAM:
DIGITAL DIAGNOSTIC UNILATERAL LEFT MAMMOGRAM WITH TOMOSYNTHESIS AND
CAD
TECHNIQUE: Left digital diagnostic mammography and breast tomosynthesis was
performed. The images were evaluated with computer-aided detection.

[L ML synth-2D]
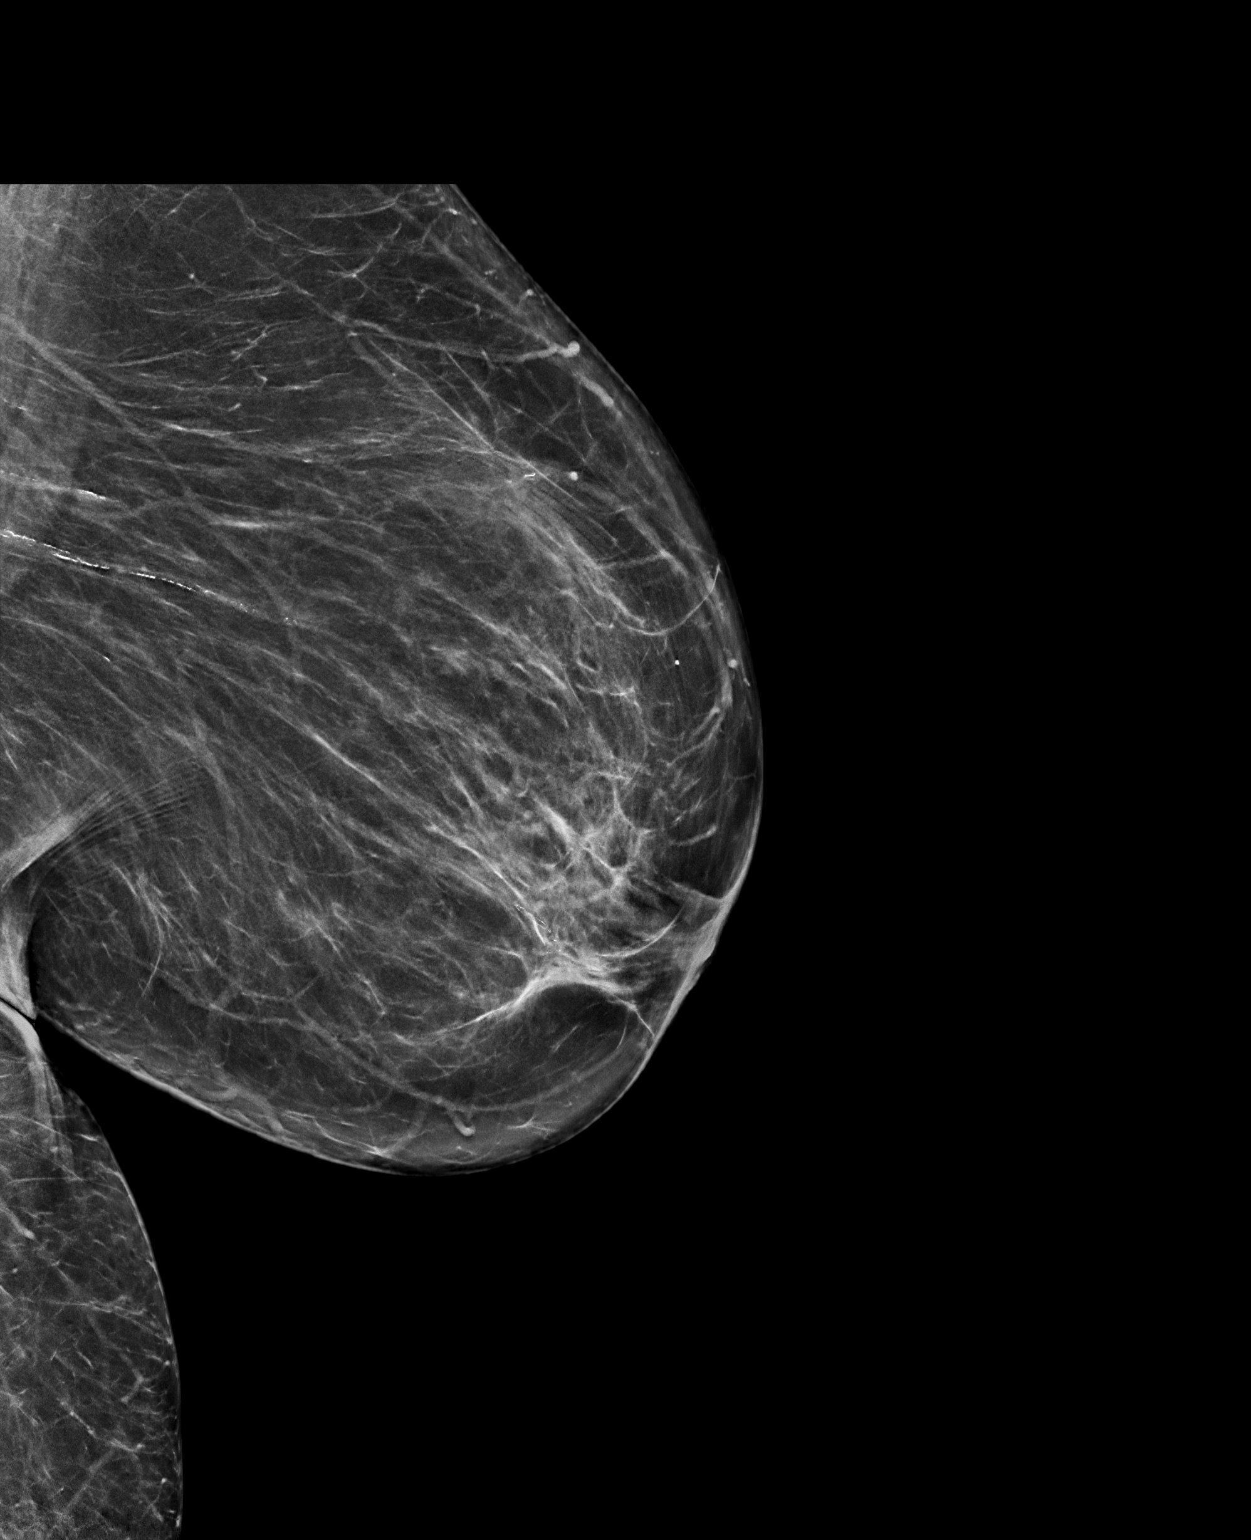

[L ML tomo · tomo slice 37/73.0]
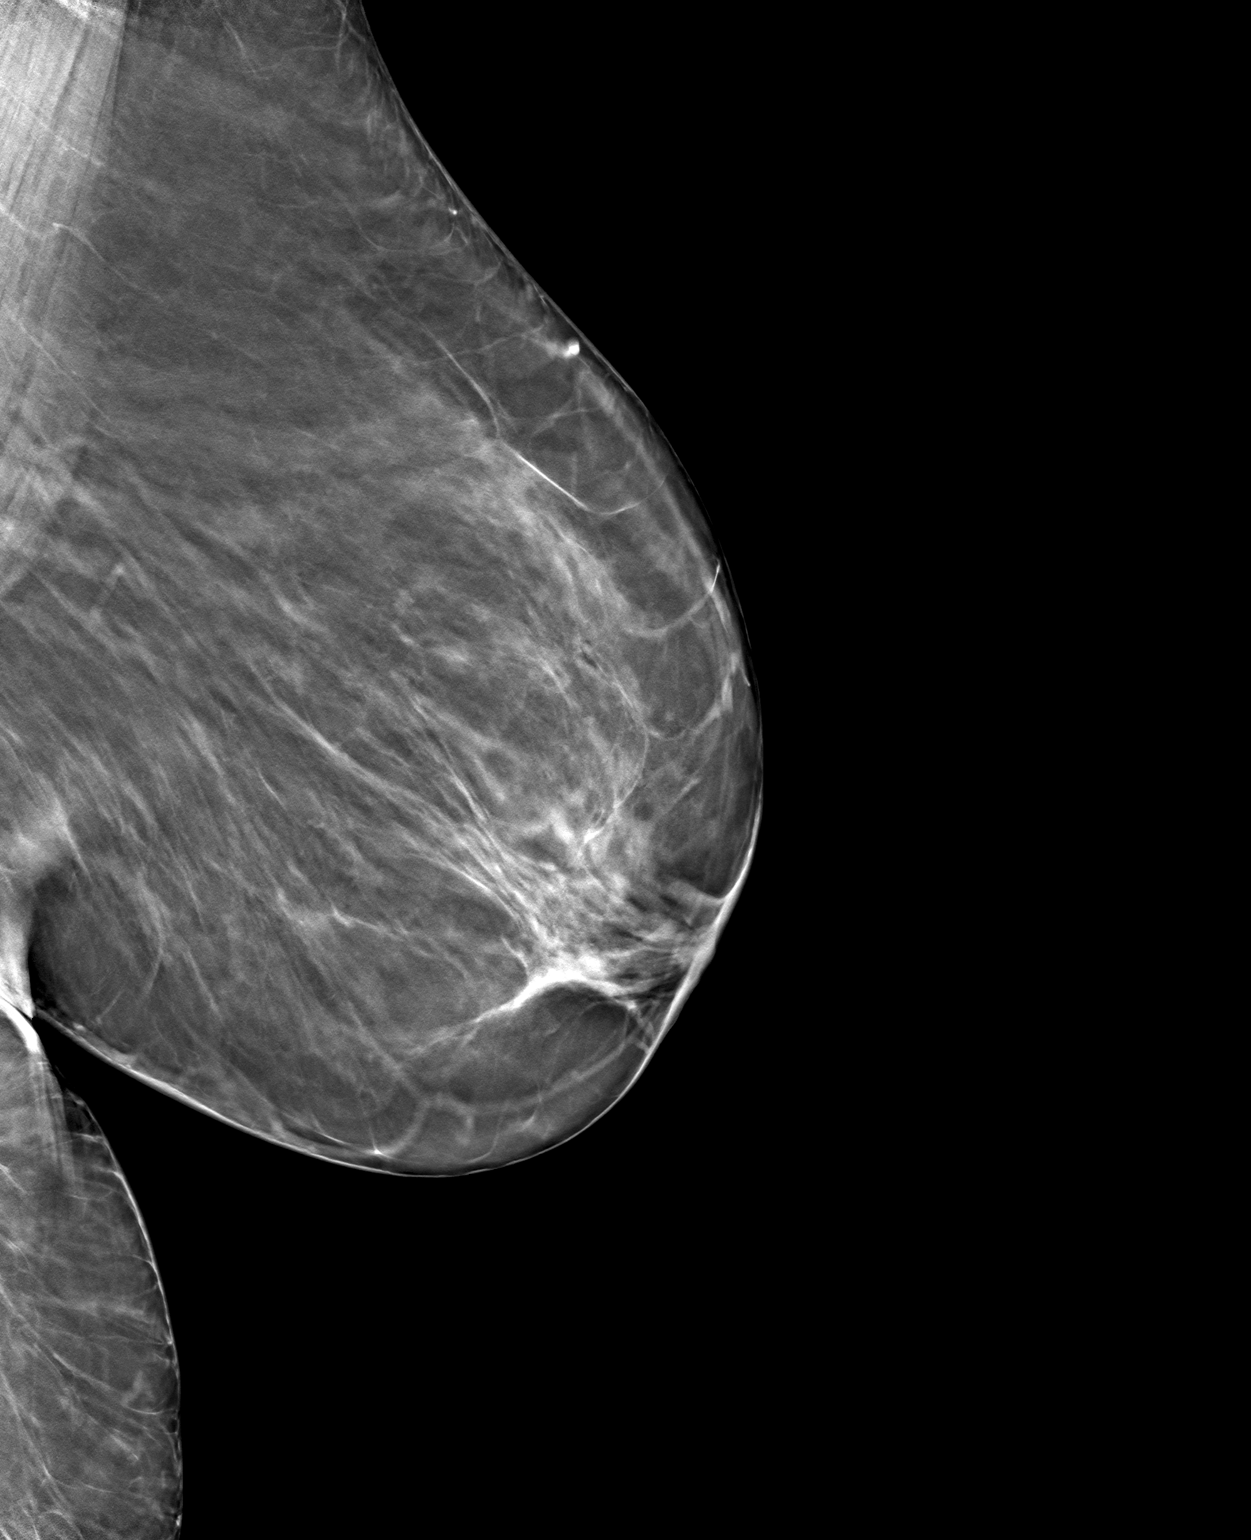

[L MLO tomo · tomo slice 5/9.0]
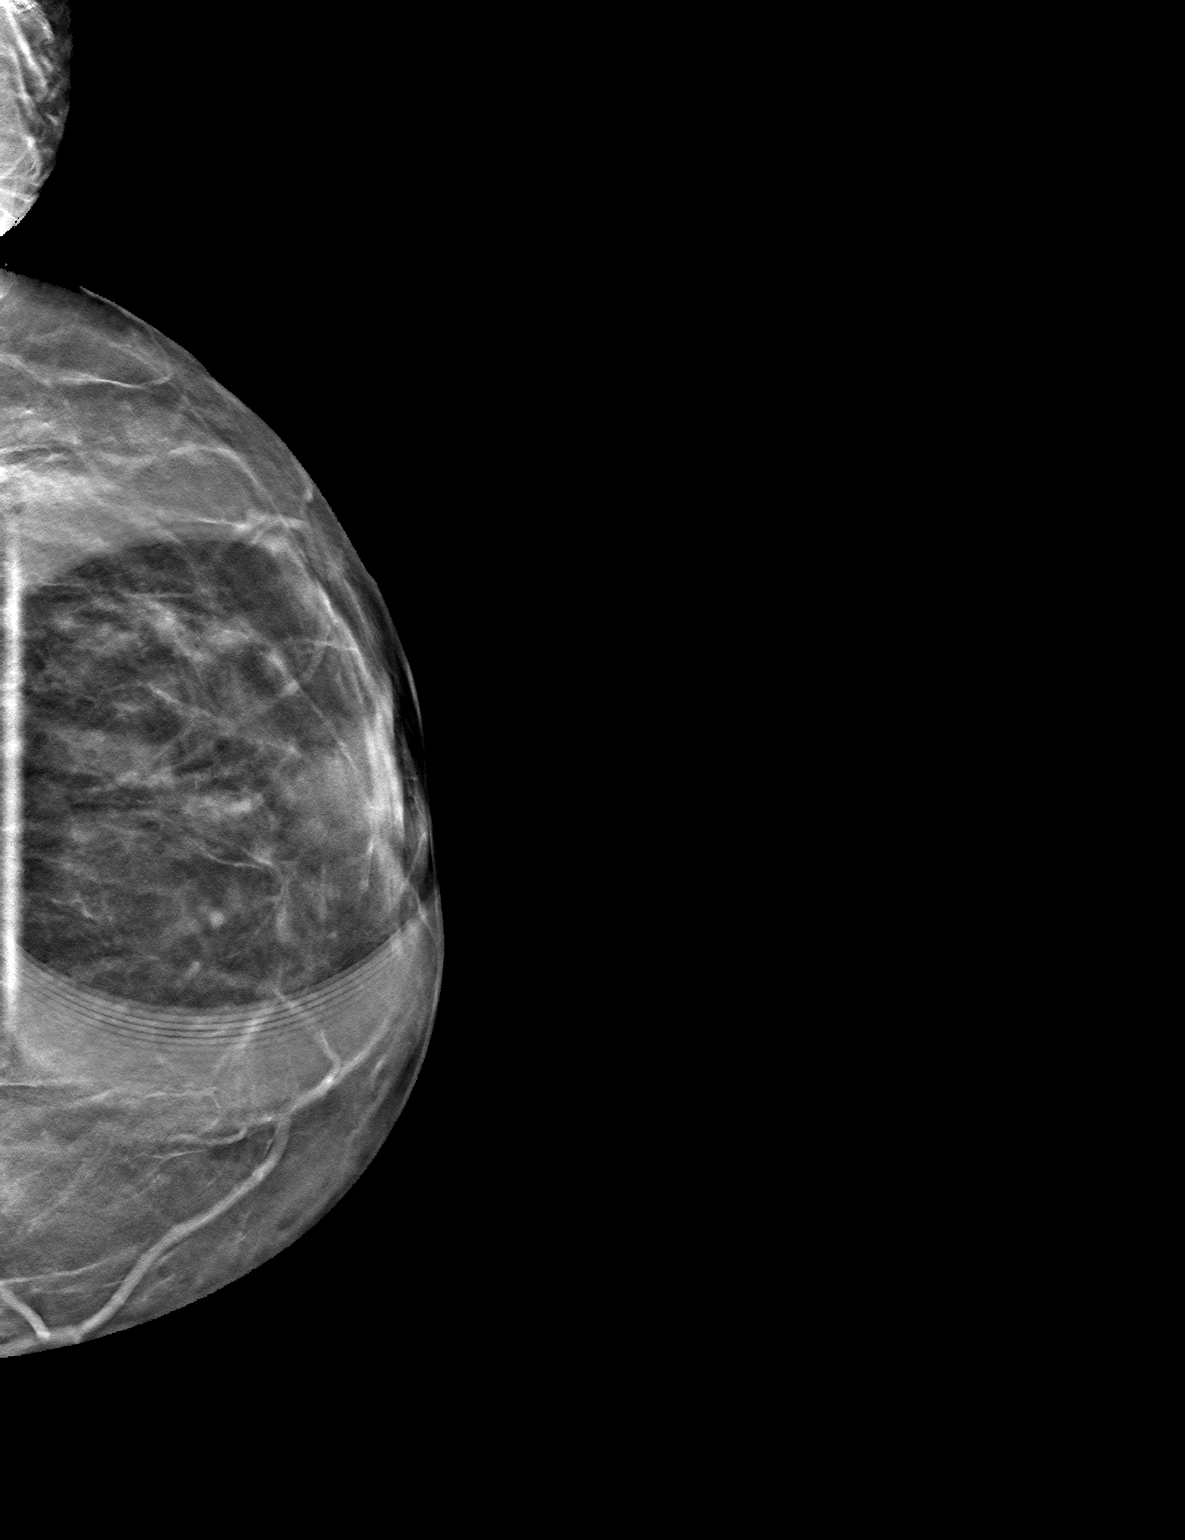

[3 of 11 positions shown; findings below may reference images not displayed]

ACR Breast Density Category b: There are scattered areas of
fibroglandular density.
FINDINGS: Previously described, possible asymmetry in the subareolar left
breast seen on the MLO projection resolves into well dispersed
fibroglandular tissue on additional views. No suspicious findings
identified.
IMPRESSION: No mammographic evidence of malignancy.

RECOMMENDATION:
Screening mammogram in one year.(Code:JG-6-YP7)

I have discussed the findings and recommendations with the patient.
If applicable, a reminder letter will be sent to the patient
regarding the next appointment.

BI-RADS CATEGORY  1: Negative.

## 2023-09-10 ENCOUNTER — Ambulatory Visit (INDEPENDENT_AMBULATORY_CARE_PROVIDER_SITE_OTHER): Admitting: Family Medicine

## 2023-09-10 ENCOUNTER — Encounter: Payer: Self-pay | Admitting: Family Medicine

## 2023-09-10 VITALS — BP 127/74 | HR 84 | Temp 98.5°F | Ht 60.0 in | Wt 186.6 lb

## 2023-09-10 DIAGNOSIS — E782 Mixed hyperlipidemia: Secondary | ICD-10-CM | POA: Diagnosis not present

## 2023-09-10 DIAGNOSIS — Z7409 Other reduced mobility: Secondary | ICD-10-CM

## 2023-09-10 DIAGNOSIS — M51362 Other intervertebral disc degeneration, lumbar region with discogenic back pain and lower extremity pain: Secondary | ICD-10-CM

## 2023-09-10 DIAGNOSIS — M150 Primary generalized (osteo)arthritis: Secondary | ICD-10-CM | POA: Diagnosis not present

## 2023-09-10 DIAGNOSIS — Z79899 Other long term (current) drug therapy: Secondary | ICD-10-CM | POA: Diagnosis not present

## 2023-09-10 DIAGNOSIS — I2510 Atherosclerotic heart disease of native coronary artery without angina pectoris: Secondary | ICD-10-CM

## 2023-09-10 DIAGNOSIS — R7989 Other specified abnormal findings of blood chemistry: Secondary | ICD-10-CM

## 2023-09-10 DIAGNOSIS — I10 Essential (primary) hypertension: Secondary | ICD-10-CM | POA: Diagnosis not present

## 2023-09-10 DIAGNOSIS — K219 Gastro-esophageal reflux disease without esophagitis: Secondary | ICD-10-CM | POA: Diagnosis not present

## 2023-09-10 MED ORDER — FUROSEMIDE 20 MG PO TABS: 40.0000 mg | ORAL_TABLET | Freq: Every day | ORAL | 3 refills | Status: DC

## 2023-09-10 MED ORDER — AMLODIPINE BESYLATE 10 MG PO TABS: 10.0000 mg | ORAL_TABLET | Freq: Every day | ORAL | 3 refills | Status: DC

## 2023-09-10 MED ORDER — HYDROCODONE-ACETAMINOPHEN 5-325 MG PO TABS: 1.0000 | ORAL_TABLET | Freq: Three times a day (TID) | ORAL | 0 refills | Status: DC | PRN

## 2023-09-10 MED ORDER — ESOMEPRAZOLE MAGNESIUM 40 MG PO CPDR: DELAYED_RELEASE_CAPSULE | ORAL | 3 refills | Status: AC

## 2023-09-10 MED ORDER — LISINOPRIL 40 MG PO TABS: 40.0000 mg | ORAL_TABLET | Freq: Every day | ORAL | 3 refills | Status: DC

## 2023-09-10 NOTE — Progress Notes (Signed)
 Established Patient Office Visit  Subjective   Patient ID: Melissa Zavala, female    DOB: 03-Apr-1941  Age: 83 y.o. MRN: [id number]  Chief Complaint  Patient presents with   Medical Management of Chronic Issues    HPI [name] is here for a chronic follow up today. She returned from [place] a few weeks ago. She was in [place] to help provide care for her son following a kidney transplant. He is doing well and she is now back in  to stay.   HTN Complaint with meds - Yes Current Medications - amlodipine  10 mg, lasix  40 mg prn, lisinopril  40 mg,  Checking BP at home ranging 110-120/60-70s Pertinent ROS:  Headache - No Fatigue - No Visual Disturbances - No Chest pain - No Dyspnea - No Palpitations - No LE edema - mild, baseline  2. HLD Was started on repatha  by the hospital to help lower her cholesterol. She has not been taking repatha . Hasn't notified the pharmacy that she is ready for this.  3. GERD Reports well controlled with medication. She does need a refill on this.   4. Pain assessment: She has had to use her pain medication sparingly.  Has had increased pain because of this. Pain is in back and radiating down both legs. Feeling off balance and is interested in doing PT again as this has been helpful in the past.   Cause of pain- arthritis, DDD Pain location- legs & back Pain on scale of 1-10- 8-9/10 without medication; 5/10 with medication Frequency- comes and goes What increases pain- prolonged standing What makes pain better- rest and medication Effects on ADL- difficulty completing yard and house work, difficulty with balance and ambulation. Any change in general medical condition- none   Current opioids rx- Norco 5/325 mg Q8H PRN # meds rx- 90 Effectiveness of current meds- effective Adverse reactions form pain meds- none Morphine equivalent- 15 MME/day   Pill count performed-No Last drug screen - today ( high risk q18m, moderate risk q63m, low risk yearly ) Urine drug  screen today- No Was the NCCSR reviewed- Yes             If yes were their any concerning findings? - No   Overdose risk: 160  Pain contract signed on: 03/21/23   ROS Negative unless specially indicated above in HPI.   Objective:     BP 127/74   Pulse 84   Temp 98.5 F (36.9 C) (Temporal)   Ht 5' (1.524 m)   Wt 186 lb 9.6 oz (84.6 kg)   SpO2 95%   BMI 36.44 kg/m  BP Readings from Last 3 Encounters:  09/10/23 127/74  03/21/23 114/61  12/26/22 130/70   Wt Readings from Last 3 Encounters:  09/10/23 186 lb 9.6 oz (84.6 kg)  03/21/23 193 lb 2 oz (87.6 kg)  12/26/22 193 lb (87.5 kg)     Physical Exam Vitals and nursing note reviewed.  Constitutional:      General: She is not in acute distress.    Appearance: She is not ill-appearing, toxic-appearing or diaphoretic.  Neck:     Thyroid : No thyroid  mass, thyromegaly or thyroid  tenderness.  Cardiovascular:     Rate and Rhythm: Normal rate and regular rhythm.     Heart sounds: Normal heart sounds. No murmur heard. Pulmonary:     Effort: Pulmonary effort is normal. No respiratory distress.     Breath sounds: Normal breath sounds.  Abdominal:     General: Bowel  sounds are normal. There is no distension.     Palpations: Abdomen is soft.     Tenderness: There is no abdominal tenderness. There is no guarding or rebound.  Musculoskeletal:     Right lower leg: No edema.     Left lower leg: No edema.  Skin:    General: Skin is warm and dry.  Neurological:     General: No focal deficit present.     Mental Status: She is alert and oriented to person, place, and time.  Psychiatric:        Mood and Affect: Mood normal.        Behavior: Behavior normal.     No results found for any visits on 09/10/23.    The ASCVD Risk score (Arnett DK, et al., 2019) failed to calculate for the following reasons:   The 2019 ASCVD risk score is only valid for ages 14 to 21    Assessment & Plan:   Amillion was seen today for medical  management of chronic issues.  Diagnoses and all orders for this visit:  Primary hypertension Well controlled on current regimen.  -     amLODipine  (NORVASC ) 10 MG tablet; Take 1 tablet (10 mg total) by mouth daily. -     furosemide  (LASIX ) 20 MG tablet; Take 2 tablets (40 mg total) by mouth daily. -     lisinopril  (ZESTRIL ) 40 MG tablet; Take 1 tablet (40 mg total) by mouth daily. -     CBC with Differential/Platelet -     CMP14+EGFR  Coronary artery disease involving native coronary artery of native heart without angina pectoris Mixed hyperlipidemia Restart repatha . Last LDL at 90.   Morbid obesity (HCC) Weight trending down. Diet, exercise as tolerated.   Abnormal TSH Will repeat today.  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 CSA and UDS is UTD.  -     HYDROcodone -acetaminophen  (NORCO) 5-325 MG tablet; Take 1 tablet by mouth every 8 (eight) hours as needed for moderate pain (pain score 4-6). -     HYDROcodone -acetaminophen  (NORCO) 5-325 MG tablet; Take 1 tablet by mouth every 8 (eight) hours as needed for moderate pain (pain score 4-6). -     HYDROcodone -acetaminophen  (NORCO) 5-325 MG tablet; Take 1 tablet by mouth every 8 (eight) hours as needed for moderate pain (pain score 4-6).  Impaired functional mobility, balance, gait, and endurance Referral back to PT.  -     Ambulatory referral to Physical Therapy  Gastroesophageal reflux disease without esophagitis Well controlled on current regimen.  -     esomeprazole  (NEXIUM ) 40 MG capsule; TAKE 1 CAPSULE BY MOUTH DAILY  Return in about 3 months (around 12/10/2023) for chronic follow up.   The patient indicates understanding of these issues and agrees with the plan.  Albertha Huger, FNP

## 2023-09-11 LAB — CBC WITH DIFFERENTIAL/PLATELET
Basophils Absolute: 0.1 10*3/uL (ref 0.0–0.2)
Basos: 1 %
EOS (ABSOLUTE): 0.2 10*3/uL (ref 0.0–0.4)
Eos: 3 %
Hematocrit: 37.9 % (ref 34.0–46.6)
Hemoglobin: 12.1 g/dL (ref 11.1–15.9)
Immature Grans (Abs): 0 10*3/uL (ref 0.0–0.1)
Immature Granulocytes: 0 %
Lymphocytes Absolute: 2.4 10*3/uL (ref 0.7–3.1)
Lymphs: 26 %
MCH: 26.8 pg (ref 26.6–33.0)
MCHC: 31.9 g/dL (ref 31.5–35.7)
MCV: 84 fL (ref 79–97)
Monocytes Absolute: 0.5 10*3/uL (ref 0.1–0.9)
Monocytes: 6 %
Neutrophils Absolute: 5.9 10*3/uL (ref 1.4–7.0)
Neutrophils: 64 %
Platelets: 249 10*3/uL (ref 150–450)
RBC: 4.52 x10E6/uL (ref 3.77–5.28)
RDW: 13.7 % (ref 11.7–15.4)
WBC: 9.2 10*3/uL (ref 3.4–10.8)

## 2023-09-11 LAB — CMP14+EGFR
ALT: 8 IU/L (ref 0–32)
AST: 16 IU/L (ref 0–40)
Albumin: 4.7 g/dL (ref 3.7–4.7)
Alkaline Phosphatase: 83 IU/L (ref 44–121)
BUN/Creatinine Ratio: 19 (ref 12–28)
BUN: 15 mg/dL (ref 8–27)
Bilirubin Total: 0.4 mg/dL (ref 0.0–1.2)
CO2: 24 mmol/L (ref 20–29)
Calcium: 10.4 mg/dL — ABNORMAL HIGH (ref 8.7–10.3)
Chloride: 101 mmol/L (ref 96–106)
Creatinine, Ser: 0.8 mg/dL (ref 0.57–1.00)
Globulin, Total: 2.7 g/dL (ref 1.5–4.5)
Glucose: 90 mg/dL (ref 70–99)
Potassium: 3.7 mmol/L (ref 3.5–5.2)
Sodium: 141 mmol/L (ref 134–144)
Total Protein: 7.4 g/dL (ref 6.0–8.5)
eGFR: 74 mL/min/{1.73_m2} (ref >59)

## 2023-09-11 LAB — TSH+FREE T4
Free T4: 1.42 ng/dL (ref 0.82–1.77)
TSH: 0.462 u[IU]/mL (ref 0.450–4.500)

## 2023-09-12 ENCOUNTER — Other Ambulatory Visit: Payer: Self-pay | Admitting: Family Medicine

## 2023-09-12 ENCOUNTER — Encounter: Payer: Self-pay | Admitting: Family Medicine

## 2023-09-12 DIAGNOSIS — R609 Edema, unspecified: Secondary | ICD-10-CM

## 2023-09-13 ENCOUNTER — Other Ambulatory Visit: Payer: Self-pay | Admitting: Nurse Practitioner

## 2023-09-24 ENCOUNTER — Telehealth: Payer: Self-pay | Admitting: Family Medicine

## 2023-09-24 NOTE — Telephone Encounter (Signed)
 Copied from CRM [id number]. Topic: Appointments - Appointment Scheduling >> Sep 24, 2023 11:56 AM Melissa Zavala wrote: Patient is calling in to schedule her AWV, but says she will call later to schedule that due to it needing to be done in October and she is back and forth to [place] 

## 2023-09-25 ENCOUNTER — Other Ambulatory Visit: Payer: Self-pay | Admitting: Family Medicine

## 2023-09-25 DIAGNOSIS — I10 Essential (primary) hypertension: Secondary | ICD-10-CM

## 2023-09-26 DIAGNOSIS — M25572 Pain in left ankle and joints of left foot: Secondary | ICD-10-CM | POA: Diagnosis not present

## 2023-09-26 DIAGNOSIS — M25472 Effusion, left ankle: Secondary | ICD-10-CM | POA: Diagnosis not present

## 2023-10-01 ENCOUNTER — Ambulatory Visit: Payer: Self-pay

## 2023-10-01 NOTE — Telephone Encounter (Signed)
 Noted

## 2023-10-01 NOTE — Telephone Encounter (Signed)
 Copied from CRM [id number]. Topic: Clinical - Red Word Triage >> Oct 01, 2023  2:00 PM Blair Bumpers wrote: Red Word that prompted transfer to Nurse Triage: Patient states her left foot had swole up a few days ago & she went to urgent care. States they gave her a shot & some pills & sent her to have x-rays done. Patient states x-rays showed no fracture. She now states her foot is back swollen and wants to see a provider tomorrow if possible.   Chief Complaint: Left foot swelling, seen in [facility] 09/26/23, no better Symptoms: above Frequency: pain 8/10 Pertinent Negatives: Patient denies fever Disposition: [] ED /[] Urgent Care (no appt availability in office) / [x] Appointment(In office/virtual)/ []  [facility]/ [] Home Care/ [] Refused Recommended Disposition /[] [facility]/ []  Follow-up with PCP Additional Notes: agrees with appointment.  Reason for Disposition  [1] Redness of the skin AND [2] no fever  Answer Assessment - Initial Assessment Questions 1. ONSET: "When did the pain start?"      UC 09/26/23 2. LOCATION: "Where is the pain located?"      Left 3. PAIN: "How bad is the pain?"    (Scale 1-10; or mild, moderate, severe)  - MILD (1-3): doesn't interfere with normal activities.   - MODERATE (4-7): interferes with normal activities (e.g., work or school) or awakens from sleep, limping.   - SEVERE (8-10): excruciating pain, unable to do any normal activities, unable to walk.      Now -8 4. WORK OR EXERCISE: "Has there been any recent work or exercise that involved this part of the body?"      NO 5. CAUSE: "What do you think is causing the foot pain?"     unsure 6. OTHER SYMPTOMS: "Do you have any other symptoms?" (e.g., leg pain, rash, fever, numbness)     no 7. PREGNANCY: "Is there any chance you are pregnant?" "When was your last menstrual period?"     no  Protocols used: Foot Pain-A-AH

## 2023-10-02 ENCOUNTER — Ambulatory Visit (INDEPENDENT_AMBULATORY_CARE_PROVIDER_SITE_OTHER): Admitting: Family Medicine

## 2023-10-02 ENCOUNTER — Ambulatory Visit (HOSPITAL_COMMUNITY)
Admission: RE | Admit: 2023-10-02 | Discharge: 2023-10-02 | Disposition: A | Discharge status: Home / Self Care | Source: Ambulatory Visit | Attending: Family Medicine | Admitting: Family Medicine

## 2023-10-02 ENCOUNTER — Ambulatory Visit: Payer: Self-pay | Admitting: Family Medicine

## 2023-10-02 ENCOUNTER — Encounter: Payer: Self-pay | Admitting: Family Medicine

## 2023-10-02 VITALS — BP 129/69 | HR 76 | Temp 98.7°F | Ht 60.0 in | Wt 188.4 lb

## 2023-10-02 DIAGNOSIS — R2242 Localized swelling, mass and lump, left lower limb: Secondary | ICD-10-CM | POA: Diagnosis not present

## 2023-10-02 DIAGNOSIS — M79672 Pain in left foot: Secondary | ICD-10-CM | POA: Diagnosis not present

## 2023-10-02 DIAGNOSIS — L03116 Cellulitis of left lower limb: Secondary | ICD-10-CM

## 2023-10-02 DIAGNOSIS — M7989 Other specified soft tissue disorders: Secondary | ICD-10-CM | POA: Diagnosis not present

## 2023-10-02 MED ORDER — DOXYCYCLINE HYCLATE 100 MG PO TABS: 100.0000 mg | ORAL_TABLET | Freq: Two times a day (BID) | ORAL | 0 refills | Status: AC

## 2023-10-02 NOTE — Progress Notes (Signed)
 Subjective: AV:WUJW foot swelling PCP: Albertha Huger, FNP JXB:JYNW C Mcginty is a 83 y.o. female presenting to clinic today for:  1. Left foot swelling Seen at UC last week for same. Plain films obtained and was negative for fracture.  She reports ongoing pain and swelling in the left ankle.  It is slightly less red and swollen than previous but it still causes problems, particular with ambulation.  She notes a couple of days ago she actually felt short of breath.  She does admit that she was on a trip in [place]  about a week and a half prior to onset of symptoms.  No blood work or ultrasound obtained at urgent care.  Has also history of hospitalization for cellulitis.  No reports of fevers.  No antibiotics given   ROS: Per HPI  Allergies  Allergen Reactions   Iodinated Contrast Media Anaphylaxis   Diphenhydramine Other (See Comments)    Patient states "feels like somebody is inside of me clawing me out"   Codeine Other (See Comments)    Hallucinations   Statins Other (See Comments)    Joint pains and weakness. Joint pains and weakness. Joint pains and weakness.   Zetia  [Ezetimibe ] Other (See Comments)    Aching in arms and legs.   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   Osteoporosis    Vitamin B12 deficiency 12/06/2017    Current Outpatient Medications:    acetaminophen  (TYLENOL ) 500 MG tablet, Take 1,000 mg by mouth every 8 (eight) hours as needed for headache or moderate pain. Pt takes 500 mg 2 tablets as needed., Disp: , Rfl:    alendronate  (FOSAMAX ) 70 MG tablet, TAKE ONE TABLET  EVERY 7 DAYS. TAKE WITH A FULL GLASS OF WATER, ON AN EMPTY STOMACH., Disp: 12 tablet, Rfl: 0   amLODipine  (NORVASC ) 10 MG tablet, Take 1 tablet (10 mg total) by mouth daily., Disp: 90 tablet, Rfl: 3   aspirin  EC 81 MG tablet, Take 81 mg by mouth daily. Swallow whole. (Patient not taking: Reported on 09/10/2023), Disp: , Rfl:     celecoxib  (CELEBREX ) 200 MG capsule, Take 200 mg by mouth daily., Disp: , Rfl:    esomeprazole  (NEXIUM ) 40 MG capsule, TAKE 1 CAPSULE BY MOUTH DAILY, Disp: 90 capsule, Rfl: 3   Evolocumab  (REPATHA  SURECLICK) 140 MG/ML SOAJ, Inject 140 mg into the skin every 14 (fourteen) days., Disp: 2 mL, Rfl: 11   fluticasone  (FLONASE ) 50 MCG/ACT nasal spray, Place 2 sprays into both nostrils daily., Disp: 16 g, Rfl: 6   furosemide  (LASIX ) 20 MG tablet, Take 2 tablets (40 mg total) by mouth daily., Disp: 90 tablet, Rfl: 3   HYDROcodone -acetaminophen  (NORCO) 5-325 MG tablet, Take 1 tablet by mouth every 8 (eight) hours as needed for moderate pain (pain score 4-6)., Disp: 90 tablet, Rfl: 0   [START ON 10/10/2023] HYDROcodone -acetaminophen  (NORCO) 5-325 MG tablet, Take 1 tablet by mouth every 8 (eight) hours as needed for moderate pain (pain score 4-6)., Disp: 90 tablet, Rfl: 0   [START ON 11/09/2023] HYDROcodone -acetaminophen  (NORCO) 5-325 MG tablet, Take 1 tablet by mouth every 8 (eight) hours as needed for moderate pain (pain score 4-6)., Disp: 90 tablet, Rfl: 0   isosorbide  mononitrate (IMDUR ) 30 MG 24 hr tablet, TAKE 1/2 TABLET BY MOUTH EVERY DAY, Disp: 45 tablet, Rfl: 0   levocetirizine (XYZAL ) 5 MG tablet, TAKE 1 TABLET BY MOUTH EVERY DAY, Disp: 90 tablet,  Rfl: 0   lisinopril  (ZESTRIL ) 40 MG tablet, TAKE 1 TABLET(40 MG) BY MOUTH DAILY, Disp: 30 tablet, Rfl: 2   potassium chloride  (KLOR-CON ) 10 MEQ tablet, TAKE ONE TABLET TWICE A DAY., Disp: 60 tablet, Rfl: 2   VITAMIN D  PO, Take 1 capsule by mouth daily., Disp: , Rfl:  Social History   Socioeconomic History   Marital status: Divorced    Spouse name: Not on file   Number of children: 7   Years of education: Not on file   Highest education level: 8th grade  Occupational History   Occupation: Retired  Tobacco Use   Smoking status: Never   Smokeless tobacco: Never  Vaping Use   Vaping status: Never Used  Substance and Sexual Activity   Alcohol use: No    Drug use: No   Sexual activity: Not Currently  Other Topics Concern   Not on file  Social History Narrative   Lives alone - all of her children live nearby   Social Drivers of Health   Financial Resource Strain: Medium Risk (07/09/2023)   Received from [facility]   Overall Financial Resource Strain (CARDIA)    Difficulty of Paying Living Expenses: Somewhat hard  Food Insecurity: No Food Insecurity (09/26/2023)   Received from [facility]   Hunger Vital Sign    Worried About Running Out of Food in the Last Year: Never true    Ran Out of Food in the Last Year: Never true  Transportation Needs: No Transportation Needs (09/26/2023)   Received from [facility]   PRAPARE - Transportation    Lack of Transportation (Medical): No    Lack of Transportation (Non-Medical): No  Physical Activity: Insufficiently Active (01/03/2022)   Exercise Vital Sign    Days of Exercise per Week: 2 days    Minutes of Exercise per Session: 20 min  Stress: No Stress Concern Present (01/18/2022)   Received from [facility] - Occupational Stress Questionnaire    Feeling of Stress : Not at all  Social Connections: Moderately Integrated (01/03/2022)   Social Connection and Isolation Panel [NHANES]    Frequency of Communication with Friends and Family: More than three times a week    Frequency of Social Gatherings with Friends and Family: More than three times a week    Attends Religious Services: More than 4 times per year    Active Member of [employer] or Organizations: Yes    Attends Banker Meetings: More than 4 times per year    Marital Status: Divorced  Intimate Partner Violence: Low Risk  (07/09/2023)   Received from [facility]   Interpersonal Safety    Does anyone neglect, hurt or threaten you?: No   Family History  Problem Relation Age of Onset   Breast cancer Mother      Objective: Office vital signs reviewed. BP 129/69   Pulse 76   Temp 98.7 F (37.1 C)   Ht 5' (1.524 m)   Wt 188 lb 6.4 oz (85.5 kg)   SpO2 96%   BMI 36.79 kg/m   Physical Examination:  General: Awake, alert, well nourished, No acute distress MSK: Has edema bilateral lower extremities and they are fairly symmetric but she does have quite a bit of exquisite tenderness along the posterior left lower leg.  She does have pain with Celine Collard' sign.  Mild warmth.  No significant erythema however Cardio: Regular rate and  rhythm. Pulm: Normal work of breathing on room air.  Clear to auscultation bilaterally  Assessment/ Plan: 83 y.o. female   Localized swelling of left lower leg - Plan: US  Venous Img Lower Unilateral Left  Stat venous ultrasound to evaluate for DVT.  If negative, will empirically treat with oral antibiotics to cover for cellulitis.   Eliodoro Guerin, DO [facility] [phone]

## 2023-10-04 ENCOUNTER — Ambulatory Visit

## 2023-10-05 ENCOUNTER — Ambulatory Visit: Attending: Family Medicine

## 2023-10-05 VITALS — Wt 193.2 lb

## 2023-10-05 DIAGNOSIS — R2681 Unsteadiness on feet: Secondary | ICD-10-CM | POA: Diagnosis not present

## 2023-10-05 DIAGNOSIS — M6281 Muscle weakness (generalized): Secondary | ICD-10-CM | POA: Insufficient documentation

## 2023-10-05 DIAGNOSIS — Z7409 Other reduced mobility: Secondary | ICD-10-CM | POA: Diagnosis not present

## 2023-10-05 DIAGNOSIS — M51362 Other intervertebral disc degeneration, lumbar region with discogenic back pain and lower extremity pain: Secondary | ICD-10-CM | POA: Diagnosis not present

## 2023-10-05 NOTE — Therapy (Signed)
 OUTPATIENT PHYSICAL THERAPY THORACOLUMBAR EVALUATION   Patient Name: Melissa Zavala MRN: [id number] DOB:08-07-1940, 83 y.o., female Today's Date: 10/05/2023  END OF SESSION:  PT End of Session - 10/05/23 0931     Visit Number 1    Number of Visits 12    Date for PT Re-Evaluation 12/07/23    PT Start Time 0933    PT Stop Time 1005    PT Time Calculation (min) 32 min    Activity Tolerance Patient tolerated treatment well    Behavior During Therapy [facility] for tasks assessed/performed             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   Osteoporosis    Vitamin B12 deficiency 12/06/2017   Past Surgical History:  Procedure Laterality Date   TOTAL HIP ARTHROPLASTY Right    TOTAL KNEE ARTHROPLASTY Bilateral    TUBAL LIGATION     Patient Active Problem List   Diagnosis Date Noted   Coronary artery disease involving native coronary artery of native heart without angina pectoris 03/21/2023   DDD (degenerative disc disease), lumbar 06/29/2022   Primary osteoarthritis of left hip 03/07/2021   Seasonal allergies 03/01/2021   Controlled substance agreement signed 09/07/2020   Mixed hyperlipidemia 06/14/2020   Essential tremor 06/08/2020   Endometrial polyp 06/12/2019   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) 12/13/2017   Primary osteoarthritis involving multiple joints 12/06/2017   Edema 12/06/2017   Primary hypertension 02/19/2017   Gastroesophageal reflux disease without esophagitis 06/11/2016   Age-related osteoporosis without current pathological fracture 06/11/2016   Bilateral carpal tunnel syndrome 06/09/2016   Morbid obesity (HCC) 01/03/2016   REFERRING PROVIDER: Albertha Huger, FNP   REFERRING DIAG: Degeneration of intervertebral disc of lumbar region with discogenic back pain and lower extremity pain, Impaired functional mobility, balance, gait, and endurance   Rationale for Evaluation and  Treatment: Rehabilitation  THERAPY DIAG:  Unsteadiness on feet  Muscle weakness (generalized)  ONSET DATE: 6-8 months   SUBJECTIVE:                                                                                                                                                                                           SUBJECTIVE STATEMENT: Patient reports that she felt like she began to be unsteady over the past 6-8 months. She in unsure what caused her to be unsteady, but she thinks that it may be due to her age. She has been to therapy before for unsteadiness on her feet. However, she feels that it was not as  bad last time. She now has to use a cane for all mobility.   PERTINENT HISTORY:  Hypertension, essential tremor, osteoporosis, and osteoarthritis  PAIN:  Are you having pain? Yes: NPRS scale: Current: "a little bit"  Pain location: both legs Pain description: aching, throbbing Aggravating factors: walking (2+ hours)  Relieving factors: medication  PRECAUTIONS: None  RED FLAGS: None   WEIGHT BEARING RESTRICTIONS: No  FALLS:  Has patient fallen in last 6 months? No, but she has had some close calls  LIVING ENVIRONMENT: Lives with: lives alone Lives in: House/apartment Stairs: No Has following equipment at home: Single point cane and Ramped entry  OCCUPATION: retired  PLOF: Independent  PATIENT GOALS: improved balance  NEXT MD VISIT: 10/11/23  OBJECTIVE:  Note: Objective measures were completed at Evaluation unless otherwise noted.  PATIENT SURVEYS:  Modified Oswestry 50% disability   COGNITION: Overall cognitive status: Within functional limits for tasks assessed     SENSATION: Patient reports numbness digits 2-3 of her left hand and tingling in her right hand  LOWER EXTREMITY ROM: WFL for activities assessed  LOWER EXTREMITY MMT:    MMT Right eval Left eval  Hip flexion 4/5 4-/5; "sore"  Hip extension    Hip abduction    Hip adduction    Hip  internal rotation    Hip external rotation    Knee flexion 3/5 3/5  Knee extension 4-/5 Limited by soreness  Ankle dorsiflexion 4+/5 4+/5  Ankle plantarflexion    Ankle inversion    Ankle eversion     (Blank rows = not tested)  FUNCTIONAL TESTS:  5 times sit to stand: 34.77 seconds without UE support Timed up and go (TUG): 33.34 seconds without AD    TUG: 26.79 seconds with SPC  GAIT: Assistive device utilized: Single point cane Level of assistance: Modified independence Comments: Shuffling pattern which is exacerbated when ambulating without an assistive device  TREATMENT DATE:                                                                                                                                  PATIENT EDUCATION:  Education details: Plan of care, prognosis, objective findings, and goals for physical therapy Person educated: Patient Education method: Explanation Education comprehension: verbalized understanding  HOME EXERCISE PROGRAM:   ASSESSMENT:  CLINICAL IMPRESSION: Patient is a 83 y.o. female who was seen today for physical therapy evaluation and treatment for unsteadiness on her feet.  She is at a high fall risk as evidenced by her gait pattern and her objective measurements.  Recommend that she continue with skilled physical therapy to address her impairments to maximize her safety and functional mobility.  OBJECTIVE IMPAIRMENTS: Abnormal gait, decreased activity tolerance, decreased balance, decreased mobility, difficulty walking, decreased strength, and pain.   ACTIVITY LIMITATIONS: standing, transfers, and locomotion level  PARTICIPATION LIMITATIONS: community activity  PERSONAL FACTORS: Age, Past/current experiences, Time since onset of injury/illness/exacerbation, and 3+  comorbidities: Hypertension, essential tremor, osteoporosis, and osteoarthritis are also affecting patient's functional outcome.   REHAB POTENTIAL: Good  CLINICAL DECISION MAKING:  Evolving/moderate complexity  EVALUATION COMPLEXITY: Moderate   GOALS: Goals reviewed with patient? Yes  SHORT TERM GOALS: Target date: 10/26/23  Patient will be independent with her initial HEP. Baseline: Goal status: INITIAL  2.  Patient will improve her 5 times sit to stand time to 22 seconds or less for improved lower extremity power. Baseline:  Goal status: INITIAL  3.  Patient will improve her timed up and go time to 19 seconds or less for improved functional mobility. Baseline:  Goal status: INITIAL  LONG TERM GOALS: Target date: 11/16/23  Patient will be independent with her advanced HEP. Baseline:  Goal status: INITIAL  2.  Patient will improve her 5 times sit to stand time to 15 seconds or less to reduce her fall risk. Baseline:  Goal status: INITIAL  3.  Patient will improve her timed up and go time to 12 seconds or less to reduce her fall risk. Baseline:  Goal status: INITIAL  4.  Patient will improve her bilateral hamstring strength to at least 4/5 for improved functional mobility. Baseline:  Goal status: INITIAL  PLAN:  PT FREQUENCY: 2x/week  PT DURATION: 6 weeks  PLANNED INTERVENTIONS: 97164- PT Re-evaluation, 97750- Physical Performance Testing, 97110-Therapeutic exercises, 97530- Therapeutic activity, 97112- Neuromuscular re-education, 97535- Self Care, 16109- Manual therapy, [phone]- Gait training, [phone]- Electrical stimulation (unattended), Patient/Family education, Balance training, Stair training, Joint mobilization, Spinal mobilization, Cryotherapy, and Moist heat.  PLAN FOR NEXT SESSION: NuStep, gait training, lumbar and lower extremity strengthening, balance interventions, and modalities as needed   Lane Pinon, PT 10/05/2023, 1:42 PM

## 2023-10-09 ENCOUNTER — Other Ambulatory Visit: Payer: Self-pay | Admitting: Family Medicine

## 2023-10-09 DIAGNOSIS — I10 Essential (primary) hypertension: Secondary | ICD-10-CM

## 2023-10-10 ENCOUNTER — Ambulatory Visit

## 2023-10-10 DIAGNOSIS — R2681 Unsteadiness on feet: Secondary | ICD-10-CM

## 2023-10-10 DIAGNOSIS — M6281 Muscle weakness (generalized): Secondary | ICD-10-CM

## 2023-10-10 DIAGNOSIS — M51362 Other intervertebral disc degeneration, lumbar region with discogenic back pain and lower extremity pain: Secondary | ICD-10-CM | POA: Diagnosis not present

## 2023-10-10 DIAGNOSIS — Z7409 Other reduced mobility: Secondary | ICD-10-CM | POA: Diagnosis not present

## 2023-10-10 NOTE — Therapy (Signed)
 OUTPATIENT PHYSICAL THERAPY THORACOLUMBAR TREATMENT   Patient Name: Melissa Zavala MRN: [id number] DOB:06-Aug-1940, 83 y.o., female Today's Date: 10/10/2023  END OF SESSION:  PT End of Session - 10/10/23 1303     Visit Number 2    Number of Visits 12    Date for PT Re-Evaluation 12/07/23    PT Start Time 1300    PT Stop Time 1344    PT Time Calculation (min) 44 min    Activity Tolerance Patient tolerated treatment well    Behavior During Therapy [facility] for tasks assessed/performed              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   Osteoporosis    Vitamin B12 deficiency 12/06/2017   Past Surgical History:  Procedure Laterality Date   TOTAL HIP ARTHROPLASTY Right    TOTAL KNEE ARTHROPLASTY Bilateral    TUBAL LIGATION     Patient Active Problem List   Diagnosis Date Noted   Coronary artery disease involving native coronary artery of native heart without angina pectoris 03/21/2023   DDD (degenerative disc disease), lumbar 06/29/2022   Primary osteoarthritis of left hip 03/07/2021   Seasonal allergies 03/01/2021   Controlled substance agreement signed 09/07/2020   Mixed hyperlipidemia 06/14/2020   Essential tremor 06/08/2020   Endometrial polyp 06/12/2019   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) 12/13/2017   Primary osteoarthritis involving multiple joints 12/06/2017   Edema 12/06/2017   Primary hypertension 02/19/2017   Gastroesophageal reflux disease without esophagitis 06/11/2016   Age-related osteoporosis without current pathological fracture 06/11/2016   Bilateral carpal tunnel syndrome 06/09/2016   Morbid obesity (HCC) 01/03/2016   REFERRING PROVIDER: Albertha Huger, FNP   REFERRING DIAG: Degeneration of intervertebral disc of lumbar region with discogenic back pain and lower extremity pain, Impaired functional mobility, balance, gait, and endurance   Rationale for Evaluation and  Treatment: Rehabilitation  THERAPY DIAG:  Unsteadiness on feet  Muscle weakness (generalized)  ONSET DATE: 6-8 months   SUBJECTIVE:                                                                                                                                                                                           SUBJECTIVE STATEMENT: Patient reports that she feels alright today. She has not had any problems since her last appointment.   PERTINENT HISTORY:  Hypertension, essential tremor, osteoporosis, and osteoarthritis  PAIN:  Are you having pain? Yes: NPRS scale: Current: "a little bit"  Pain location: both legs Pain description: aching, throbbing Aggravating factors: walking (  2+ hours)  Relieving factors: medication  PRECAUTIONS: None  RED FLAGS: None   WEIGHT BEARING RESTRICTIONS: No  FALLS:  Has patient fallen in last 6 months? No, but she has had some close calls  LIVING ENVIRONMENT: Lives with: lives alone Lives in: House/apartment Stairs: No Has following equipment at home: Single point cane and Ramped entry  OCCUPATION: retired  PLOF: Independent  PATIENT GOALS: improved balance  NEXT MD VISIT: 10/11/23  OBJECTIVE:  Note: Objective measures were completed at Evaluation unless otherwise noted.  PATIENT SURVEYS:  Modified Oswestry 50% disability   COGNITION: Overall cognitive status: Within functional limits for tasks assessed     SENSATION: Patient reports numbness digits 2-3 of her left hand and tingling in her right hand  LOWER EXTREMITY ROM: WFL for activities assessed  LOWER EXTREMITY MMT:    MMT Right eval Left eval  Hip flexion 4/5 4-/5; "sore"  Hip extension    Hip abduction    Hip adduction    Hip internal rotation    Hip external rotation    Knee flexion 3/5 3/5  Knee extension 4-/5 Limited by soreness  Ankle dorsiflexion 4+/5 4+/5  Ankle plantarflexion    Ankle inversion    Ankle eversion     (Blank rows = not  tested)  FUNCTIONAL TESTS:  5 times sit to stand: 34.77 seconds without UE support Timed up and go (TUG): 33.34 seconds without AD    TUG: 26.79 seconds with SPC  GAIT: Assistive device utilized: Single point cane Level of assistance: Modified independence Comments: Shuffling pattern which is exacerbated when ambulating without an assistive device  TREATMENT DATE:                                                                                                                                                                 10/10/23 EXERCISE LOG  Exercise Repetitions and Resistance Comments  Nustep  L3 x 16.5 minutes   LAQ 3# x 2.5 minutes Alternating LE  Seated hip ADD isometric  4 minutes w/ 5 second hold   Seated march 3# x 15 reps each  Alternating LE  Seated hip ABD  3# x 2 x 1 minute   Toe taps on step  8" step x 3 minutes  Alternating LE  Rocker board  4.5 minutes    Blank cell = exercise not performed today   PATIENT EDUCATION:  Education details:  Person educated: Patient Education method: Explanation Education comprehension: verbalized understanding  HOME EXERCISE PROGRAM:   ASSESSMENT:  CLINICAL IMPRESSION: Patient was introduced to multiple new interventions for improved lower extremity strength and stability needed for improved function with activities such as navigating stairs. She required minimal cueing with toe taps on the step for proper positioning to facilitate improved  foot clearance. She reported that her legs felt tired upon the conclusion of treatment. She continues to require skilled physical therapy to address her remaining impairments to maximize her safety and functional mobility.   OBJECTIVE IMPAIRMENTS: Abnormal gait, decreased activity tolerance, decreased balance, decreased mobility, difficulty walking, decreased strength, and pain.   ACTIVITY LIMITATIONS: standing, transfers, and locomotion level  PARTICIPATION LIMITATIONS: community  activity  PERSONAL FACTORS: Age, Past/current experiences, Time since onset of injury/illness/exacerbation, and 3+ comorbidities: Hypertension, essential tremor, osteoporosis, and osteoarthritis are also affecting patient's functional outcome.   REHAB POTENTIAL: Good  CLINICAL DECISION MAKING: Evolving/moderate complexity  EVALUATION COMPLEXITY: Moderate   GOALS: Goals reviewed with patient? Yes  SHORT TERM GOALS: Target date: 10/26/23  Patient will be independent with her initial HEP. Baseline: Goal status: INITIAL  2.  Patient will improve her 5 times sit to stand time to 22 seconds or less for improved lower extremity power. Baseline:  Goal status: INITIAL  3.  Patient will improve her timed up and go time to 19 seconds or less for improved functional mobility. Baseline:  Goal status: INITIAL  LONG TERM GOALS: Target date: 11/16/23  Patient will be independent with her advanced HEP. Baseline:  Goal status: INITIAL  2.  Patient will improve her 5 times sit to stand time to 15 seconds or less to reduce her fall risk. Baseline:  Goal status: INITIAL  3.  Patient will improve her timed up and go time to 12 seconds or less to reduce her fall risk. Baseline:  Goal status: INITIAL  4.  Patient will improve her bilateral hamstring strength to at least 4/5 for improved functional mobility. Baseline:  Goal status: INITIAL  PLAN:  PT FREQUENCY: 2x/week  PT DURATION: 6 weeks  PLANNED INTERVENTIONS: 97164- PT Re-evaluation, 97750- Physical Performance Testing, 97110-Therapeutic exercises, 97530- Therapeutic activity, 97112- Neuromuscular re-education, 97535- Self Care, 16109- Manual therapy, [phone]- Gait training, [phone]- Electrical stimulation (unattended), Patient/Family education, Balance training, Stair training, Joint mobilization, Spinal mobilization, Cryotherapy, and Moist heat.  PLAN FOR NEXT SESSION: NuStep, gait training, lumbar and lower extremity strengthening,  balance interventions, and modalities as needed   Lane Pinon, PT 10/10/2023, 2:56 PM

## 2023-10-11 ENCOUNTER — Ambulatory Visit: Admitting: Family Medicine

## 2023-10-11 VITALS — BP 133/65 | HR 73 | Temp 98.3°F | Ht 60.0 in | Wt 192.0 lb

## 2023-10-11 DIAGNOSIS — R079 Chest pain, unspecified: Secondary | ICD-10-CM

## 2023-10-11 DIAGNOSIS — L03116 Cellulitis of left lower limb: Secondary | ICD-10-CM

## 2023-10-11 DIAGNOSIS — I10 Essential (primary) hypertension: Secondary | ICD-10-CM

## 2023-10-11 MED ORDER — CEPHALEXIN 500 MG PO CAPS
500.0000 mg | ORAL_CAPSULE | Freq: Four times a day (QID) | ORAL | 0 refills | Status: AC
Start: 2023-10-11 — End: 2023-10-18

## 2023-10-11 NOTE — Progress Notes (Signed)
 Acute Office Visit  Subjective:     Patient ID: Melissa Zavala, female    DOB: December 07, 1940, 83 y.o.   MRN: [id number]  Chief Complaint  Patient presents with   Edema    HPI Patient is in today for follow up of left leg cellulitis. She was seen on 10/02/23 for edema of left lower leg. Had STAT DVT US  that was negative. Started on doxycyline for cellulitis. She reports improvement in symptoms. Completed abx this morning. Left lower leg has some swelling around ankle still with a little warmth and tenderness. Redness has resolved.   She had an episode of chest pain this morning that was substernal. She was bending over to put lotion on her lower leg when the pain started. It was a dull pain that lasted a few mins. She took a nitroglycerin. Pain gradually resolved over a few mins. Pain did not worsen with exertion. Denies any other symptoms with it. Recently had nuclear stress test while admitted at [facility] without significant findings. Established with cardiology but has not scheduled follow up appt.   ROS As per HPI.      Objective:    BP 133/65   Pulse 73   Temp 98.3 F (36.8 C) (Temporal)   Ht 5' (1.524 m)   Wt 192 lb (87.1 kg)   SpO2 97%   BMI 37.50 kg/m  Wt Readings from Last 3 Encounters:  10/16/23 194 lb (88 kg)  10/11/23 192 lb (87.1 kg)  10/05/23 193 lb 3.2 oz (87.6 kg)      Physical Exam Vitals and nursing note reviewed.  Constitutional:      General: She is not in acute distress.    Appearance: Normal appearance. She is not ill-appearing.  Cardiovascular:     Rate and Rhythm: Normal rate and regular rhythm.     Pulses: Normal pulses.     Heart sounds: Normal heart sounds. No murmur heard. Pulmonary:     Effort: Pulmonary effort is normal. No respiratory distress.     Breath sounds: Normal breath sounds.  Abdominal:     General: Bowel sounds are normal. There is no distension.     Palpations: Abdomen is soft. There is no mass.     Tenderness: There is no  abdominal tenderness. There is no guarding or rebound.  Musculoskeletal:     Right lower leg: No edema.     Left lower leg: Edema (1+ non pitting edem to LLE with slight warmth and tenderness) present.  Skin:    General: Skin is warm and dry.  Neurological:     General: No focal deficit present.     Mental Status: She is alert and oriented to person, place, and time.  Psychiatric:        Mood and Affect: Mood normal.        Behavior: Behavior normal.     No results found for any visits on 10/11/23.      Assessment & Plan:   Soren was seen today for edema.  Diagnoses and all orders for this visit:  Cellulitis of left lower extremity Improving but not resolved. Keflex  ordered as below. Will follow up in 1 week.  -     cephALEXin  (KEFLEX ) 500 MG capsule; Take 1 capsule (500 mg total) by mouth 4 (four) times daily for 7 days.  Chest pain, unspecified type EKG today sinus rhythm, LVH. No significant change from previous. Reviewed recent nuclear stress test results from hospitalization. Schedule follow up  with cardiology. Discussed when to seek emergency care.  -     EKG 12-Lead  Morbid obesity (HCC) Trending up. Diet, exercise as tolerated, weight loss.   Primary hypertension Well controlled on current regimen.    Return in about 1 week (around 10/18/2023) for leg follow up.  The patient indicates understanding of these issues and agrees with the plan.   Albertha Huger, FNP

## 2023-10-16 ENCOUNTER — Ambulatory Visit (HOSPITAL_COMMUNITY)
Admission: RE | Admit: 2023-10-16 | Discharge: 2023-10-16 | Disposition: A | Discharge status: Home / Self Care | Source: Ambulatory Visit | Attending: Family Medicine | Admitting: Family Medicine

## 2023-10-16 ENCOUNTER — Ambulatory Visit: Payer: Self-pay | Admitting: Family Medicine

## 2023-10-16 ENCOUNTER — Encounter: Payer: Self-pay | Admitting: Family Medicine

## 2023-10-16 ENCOUNTER — Encounter

## 2023-10-16 ENCOUNTER — Ambulatory Visit: Payer: Self-pay

## 2023-10-16 ENCOUNTER — Ambulatory Visit: Admitting: Family Medicine

## 2023-10-16 ENCOUNTER — Telehealth: Payer: Self-pay

## 2023-10-16 VITALS — BP 138/55 | HR 85 | Temp 98.5°F | Ht 60.0 in | Wt 194.0 lb

## 2023-10-16 DIAGNOSIS — M7989 Other specified soft tissue disorders: Secondary | ICD-10-CM | POA: Diagnosis not present

## 2023-10-16 DIAGNOSIS — R600 Localized edema: Secondary | ICD-10-CM | POA: Insufficient documentation

## 2023-10-16 NOTE — Progress Notes (Signed)
 Negative for DVT, recommend elevation, ambulation, compression stockings. Recommend follow up with PCP.

## 2023-10-16 NOTE — Telephone Encounter (Signed)
Patient is scheduled to see provider today.

## 2023-10-16 NOTE — Progress Notes (Signed)
 Subjective:  Patient ID: Melissa Zavala, female    DOB: [birth year], 83 y.o.   MRN: [id number]  Patient Care Team: Albertha Huger, FNP as PCP - General (Family Medicine) Amanda Jungling Joyceann No, MD as PCP - Cardiology (Cardiology) Sharol Decamp, MD as Surgeon (Student)   Chief Complaint:  right foot/ankle swelling  HPI: Melissa Zavala is a 83 y.o. female presenting on 10/16/2023 for right foot/ankle swelling  HPI Patient presents today for right ankle/foot swelling. She was seen at [facility] on 10/02/23 for left lower leg swelling. She completed US  of leg that was negative for DVT and was treated with oral abx to cover for cellulitis. 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 She completed course of doxycycline  and has started course of cephalexin . She started cephalexin  on 10/11/23. Today she presents with right lower extremity edema.  States that she was sitting at her table last night and after sitting it became swollen and painful. Denies trauma or injury. States that it is warm to touch and very painful to walk on. She is having to use a can to walk. States that it was a little better after sleeping last night but that it remains swollen today. Denies shortness of breath or chest pain today.    Relevant past medical, surgical, family, and social history reviewed and updated as indicated.  Allergies and medications reviewed and updated. Data reviewed: Chart in Epic.   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   Osteoporosis    Vitamin B12 deficiency 12/06/2017    Past Surgical History:  Procedure Laterality Date   TOTAL HIP ARTHROPLASTY Right    TOTAL KNEE ARTHROPLASTY Bilateral    TUBAL LIGATION      Social History   Socioeconomic History   Marital status: Divorced    Spouse name: Not on file   Number of children: 7   Years of education: Not on file    Highest education level: 8th grade  Occupational History   Occupation: Retired  Tobacco Use   Smoking status: Never   Smokeless tobacco: Never  Vaping Use   Vaping status: Never Used  Substance and Sexual Activity   Alcohol use: No   Drug use: No   Sexual activity: Not Currently  Other Topics Concern   Not on file  Social History Narrative   Lives alone - all of her children live nearby   Social Drivers of Health   Financial Resource Strain: Medium Risk (07/09/2023)   Received from [facility]   Overall Financial Resource Strain (CARDIA)    Difficulty of Paying Living Expenses: Somewhat hard  Food Insecurity: No Food Insecurity (09/26/2023)   Received from [facility]   Hunger Vital Sign    Worried About Running Out of Food in the Last Year: Never true    Ran Out of Food in the Last Year: Never true  Transportation Needs: No Transportation Needs (09/26/2023)   Received from [facility]   PRAPARE - Transportation    Lack of Transportation (Medical): No    Lack of Transportation (Non-Medical): No  Physical Activity: Insufficiently Active (01/03/2022)   Exercise Vital Sign    Days of Exercise per Week: 2 days    Minutes of Exercise per Session: 20 min  Stress: No Stress Concern Present (01/18/2022)   Received  from [facility]   [employer] of Occupational Health - Occupational Stress Questionnaire    Feeling of Stress : Not at all  Social Connections: Moderately Integrated (01/03/2022)   Social Connection and Isolation Panel [NHANES]    Frequency of Communication with Friends and Family: More than three times a week    Frequency of Social Gatherings with Friends and Family: More than three times a week    Attends Religious Services: More than 4 times per year    Active Member of [employer] or Organizations: Yes    Attends Banker Meetings: More than 4 times per year    Marital Status: Divorced  Intimate Partner  Violence: Low Risk  (07/09/2023)   Received from [facility]   Interpersonal Safety    Does anyone neglect, hurt or threaten you?: No    Outpatient Encounter Medications as of 10/16/2023  Medication Sig   acetaminophen  (TYLENOL ) 500 MG tablet Take 1,000 mg by mouth every 8 (eight) hours as needed for headache or moderate pain. Pt takes 500 mg 2 tablets as needed.   alendronate  (FOSAMAX ) 70 MG tablet TAKE ONE TABLET  EVERY 7 DAYS. TAKE WITH A FULL GLASS OF WATER, ON AN EMPTY STOMACH.   amLODipine  (NORVASC ) 10 MG tablet TAKE 1 TABLET BY MOUTH EVERY DAY   aspirin  EC 81 MG tablet Take 81 mg by mouth daily. Swallow whole.   celecoxib  (CELEBREX ) 200 MG capsule Take 200 mg by mouth daily.   cephALEXin  (KEFLEX ) 500 MG capsule Take 1 capsule (500 mg total) by mouth 4 (four) times daily for 7 days.   esomeprazole  (NEXIUM ) 40 MG capsule TAKE 1 CAPSULE BY MOUTH DAILY   Evolocumab  (REPATHA  SURECLICK) 140 MG/ML SOAJ Inject 140 mg into the skin every 14 (fourteen) days.   fluticasone  (FLONASE ) 50 MCG/ACT nasal spray Place 2 sprays into both nostrils daily.   furosemide  (LASIX ) 20 MG tablet Take 2 tablets (40 mg total) by mouth daily.   HYDROcodone -acetaminophen  (NORCO) 5-325 MG tablet Take 1 tablet by mouth every 8 (eight) hours as needed for moderate pain (pain score 4-6).   [START ON 11/09/2023] HYDROcodone -acetaminophen  (NORCO) 5-325 MG tablet Take 1 tablet by mouth every 8 (eight) hours as needed for moderate pain (pain score 4-6).   isosorbide  mononitrate (IMDUR ) 30 MG 24 hr tablet TAKE 1/2 TABLET BY MOUTH EVERY DAY   levocetirizine (XYZAL ) 5 MG tablet TAKE 1 TABLET BY MOUTH EVERY DAY   lisinopril  (ZESTRIL ) 40 MG tablet TAKE 1 TABLET(40 MG) BY MOUTH DAILY   potassium chloride  (KLOR-CON ) 10 MEQ tablet TAKE ONE TABLET TWICE A DAY.   VITAMIN D  PO Take 1 capsule by mouth daily.   No facility-administered encounter medications on file as of 10/16/2023.    Allergies  Allergen  Reactions   Iodinated Contrast Media Anaphylaxis   Diphenhydramine Other (See Comments)    Patient states "feels like somebody is inside of me clawing me out"   Codeine Other (See Comments)    Hallucinations   Statins Other (See Comments)    Joint pains and weakness. Joint pains and weakness. Joint pains and weakness.   Zetia  [Ezetimibe ] Other (See Comments)    Aching in arms and legs.    Review of Systems As per HPI  Objective:  BP (!) 138/55   Pulse 85   Temp 98.5 F (36.9 C)   Ht 5' (1.524 m)   Wt 194 lb (88 kg)   SpO2 97%   BMI  37.89 kg/m    Wt Readings from Last 3 Encounters:  10/16/23 194 lb (88 kg)  10/11/23 192 lb (87.1 kg)  10/05/23 193 lb 3.2 oz (87.6 kg)    Physical Exam Constitutional:      General: She is awake. She is not in acute distress.    Appearance: Normal appearance. She is well-developed and well-groomed. She is obese. She is not ill-appearing, toxic-appearing or diaphoretic.  Cardiovascular:     Rate and Rhythm: Normal rate and regular rhythm.     Pulses: Normal pulses.          Radial pulses are 2+ on the right side and 2+ on the left side.       Posterior tibial pulses are 2+ on the right side and 2+ on the left side.     Heart sounds: Normal heart sounds. No murmur heard.    No gallop.     Comments: Right leg 37 cm - hyperpigmented, pulse intact, warm to touch Left 35.5 cm  Pulmonary:     Effort: Pulmonary effort is normal. No respiratory distress.     Breath sounds: Normal breath sounds. No stridor. No wheezing, rhonchi or rales.  Musculoskeletal:     Cervical back: Full passive range of motion without pain and neck supple.     Right lower leg: 3+ Edema present.     Left lower leg: 2+ Edema present.  Skin:    General: Skin is warm.     Capillary Refill: Capillary refill takes less than 2 seconds.  Neurological:     General: No focal deficit present.     Mental Status: She is alert, oriented to person, place, and time and easily  aroused. Mental status is at baseline.     GCS: GCS eye subscore is 4. GCS verbal subscore is 5. GCS motor subscore is 6.     Motor: No weakness.  Psychiatric:        Attention and Perception: Attention and perception normal.        Mood and Affect: Mood and affect normal.        Speech: Speech normal.        Behavior: Behavior normal. Behavior is cooperative.        Thought Content: Thought content normal. Thought content does not include homicidal or suicidal ideation. Thought content does not include homicidal or suicidal plan.        Cognition and Memory: Cognition and memory normal.        Judgment: Judgment normal.     Results for orders placed or performed in visit on 09/10/23  TSH + free T4   Collection Time: 09/10/23  3:39 PM  Result Value Ref Range   TSH 0.462 0.450 - 4.500 uIU/mL   Free T4 1.42 0.82 - 1.77 ng/dL  CBC with Differential/Platelet   Collection Time: 09/10/23  3:39 PM  Result Value Ref Range   WBC 9.2 3.4 - 10.8 x10E3/uL   RBC 4.52 3.77 - 5.28 x10E6/uL   Hemoglobin 12.1 11.1 - 15.9 g/dL   Hematocrit 11.9 14.7 - 46.6 %   MCV 84 79 - 97 fL   MCH 26.8 26.6 - 33.0 pg   MCHC 31.9 31.5 - 35.7 g/dL   RDW 82.9 56.2 - 13.0 %   Platelets 249 150 - 450 x10E3/uL   Neutrophils 64 Not Estab. %   Lymphs 26 Not Estab. %   Monocytes 6 Not Estab. %   Eos 3 Not Estab. %  Basos 1 Not Estab. %   Neutrophils Absolute 5.9 1.4 - 7.0 x10E3/uL   Lymphocytes Absolute 2.4 0.7 - 3.1 x10E3/uL   Monocytes Absolute 0.5 0.1 - 0.9 x10E3/uL   EOS (ABSOLUTE) 0.2 0.0 - 0.4 x10E3/uL   Basophils Absolute 0.1 0.0 - 0.2 x10E3/uL   Immature Granulocytes 0 Not Estab. %   Immature Grans (Abs) 0.0 0.0 - 0.1 x10E3/uL  CMP14+EGFR   Collection Time: 09/10/23  3:39 PM  Result Value Ref Range   Glucose 90 70 - 99 mg/dL   BUN 15 8 - 27 mg/dL   Creatinine, Ser 1.61 0.57 - 1.00 mg/dL   eGFR 74 >09 UE/AVW/0.98   BUN/Creatinine Ratio 19 12 - 28   Sodium 141 134 - 144 mmol/L   Potassium 3.7 3.5  - 5.2 mmol/L   Chloride 101 96 - 106 mmol/L   CO2 24 20 - 29 mmol/L   Calcium  10.4 (H) 8.7 - 10.3 mg/dL   Total Protein 7.4 6.0 - 8.5 g/dL   Albumin 4.7 3.7 - 4.7 g/dL   Globulin, Total 2.7 1.5 - 4.5 g/dL   Bilirubin Total 0.4 0.0 - 1.2 mg/dL   Alkaline Phosphatase 83 44 - 121 IU/L   AST 16 0 - 40 IU/L   ALT 8 0 - 32 IU/L       10/16/2023   10:14 AM 10/02/2023    2:03 PM 09/10/2023    3:19 PM 03/21/2023   10:53 AM 11/21/2022   11:15 AM  Depression screen PHQ 2/9  Decreased Interest 0 0 0 0 0  Down, Depressed, Hopeless 0 0 0 0 0  PHQ - 2 Score 0 0 0 0 0  Altered sleeping 0 0 0 0 0  Tired, decreased energy 0 0 0 0 0  Change in appetite 0 0 0 0 0  Feeling bad or failure about yourself  0 0 0 0 0  Trouble concentrating 0 0 0 0 0  Moving slowly or fidgety/restless 0 0 0 0 0  Suicidal thoughts 0 0 0 0 0  PHQ-9 Score 0 0 0 0 0  Difficult doing work/chores Not difficult at all Not difficult at all Not difficult at all Not difficult at all Not difficult at all       10/16/2023   10:15 AM 10/02/2023    2:03 PM 09/10/2023    3:19 PM 03/21/2023   10:52 AM  GAD 7 : Generalized Anxiety Score  Nervous, Anxious, on Edge 0 0 0 0  Control/stop worrying 0 0 0 0  Worry too much - different things 0 0 0 0  Trouble relaxing 0 0 0 0  Restless 0 0 0 0  Easily annoyed or irritable 0 0 0 0  Afraid - awful might happen 0 0 0 0  Total GAD 7 Score 0 0 0 0  Anxiety Difficulty Not difficult at all Not difficult at all Not difficult at all Not difficult at all      Pertinent labs & imaging results that were available during my care of the patient were reviewed by me and considered in my medical decision making.  Assessment & Plan:  Ilaria was seen today for right foot/ankle swelling.  Diagnoses and all orders for this visit:  Edema of right lower extremity STAT Imaging as below. Will communicate results to patient once available. Will await results to determine next steps. As patient is already  taking abx, do not believe that we need to prescribe additional abx.  If negative, will recommend ambulation, elevation, and compression stockings.  -     US  Venous Img Lower Unilateral Right (DVT); Future  Continue all other maintenance medications.  Follow up plan: Return if symptoms worsen or fail to improve.   Continue healthy lifestyle choices, including diet (rich in fruits, vegetables, and lean proteins, and low in salt and simple carbohydrates) and exercise (at least 30 minutes of moderate physical activity daily).  Written and verbal instructions provided   The above assessment and management plan was discussed with the patient. The patient verbalized understanding of and has agreed to the management plan. Patient is aware to call the clinic if they develop any new symptoms or if symptoms persist or worsen. Patient is aware when to return to the clinic for a follow-up visit. Patient educated on when it is appropriate to go to the emergency department.   Jacqualyn Mates, DNP-FNP [facility] [address] [place], [place] [address] [phone]

## 2023-10-16 NOTE — Telephone Encounter (Signed)
  Chief Complaint: right foot pain Symptoms: pain and swelling and red Frequency: since yesterday Pertinent Negatives: Patient denies fever Disposition: [] ED /[] Urgent Care (no appt availability in office) / [x] Appointment(In office/virtual)/ []  [facility]/ [] Home Care/ [] Refused Recommended Disposition /[] [facility]/ []  Follow-up with PCP  Additional Notes: pt states that yesterday she was sitting at the table and when she got up her foot had pain and was swollen. States that she is currently on antibiotics for her left foot for cellulitis. States right foot was red and warm to touch yesterday. States this morning it is not a red and warm just in pain and hurts to walk on it.  States 8-9/10.  States pain radiated up leg to around half way to calf.   Copied from CRM [id number]. Topic: Clinical - Red Word Triage >> Oct 16, 2023  8:23 AM Georgeann Kindred wrote: Red Word that prompted transfer to Nurse Triage: Patient states that she now has Cellulitis in her right foot. As she is unable to put pressure on it, foot is warm to the touch, and swollen. Reason for Disposition  [1] Swollen foot AND [2] no fever  (Exceptions: localized bump from bunions, calluses, insect bite, sting)  Protocols used: Foot Pain-A-AH

## 2023-10-16 NOTE — Telephone Encounter (Signed)
 Copied from CRM [id number]. Topic: Clinical - Lab/Test Results >> Oct 16, 2023  2:32 PM Antwanette L wrote: Reason for CRM: Patient had an ultrasound on her right leg today at the hospital. The patient is requesting a callback when the results come in. Patient can be contacted at [phone]

## 2023-10-16 NOTE — Telephone Encounter (Signed)
 Results are in chart - please see result notes

## 2023-10-18 ENCOUNTER — Encounter

## 2023-10-18 ENCOUNTER — Ambulatory Visit: Admitting: Family Medicine

## 2023-10-18 DIAGNOSIS — I82411 Acute embolism and thrombosis of right femoral vein: Secondary | ICD-10-CM | POA: Diagnosis not present

## 2023-10-18 DIAGNOSIS — I517 Cardiomegaly: Secondary | ICD-10-CM | POA: Diagnosis not present

## 2023-10-18 DIAGNOSIS — I10 Essential (primary) hypertension: Secondary | ICD-10-CM | POA: Diagnosis not present

## 2023-10-18 DIAGNOSIS — E876 Hypokalemia: Secondary | ICD-10-CM | POA: Diagnosis not present

## 2023-10-18 DIAGNOSIS — K219 Gastro-esophageal reflux disease without esophagitis: Secondary | ICD-10-CM | POA: Diagnosis not present

## 2023-10-18 DIAGNOSIS — R54 Age-related physical debility: Secondary | ICD-10-CM | POA: Diagnosis not present

## 2023-10-18 DIAGNOSIS — Z9181 History of falling: Secondary | ICD-10-CM | POA: Diagnosis not present

## 2023-10-18 DIAGNOSIS — R531 Weakness: Secondary | ICD-10-CM | POA: Diagnosis not present

## 2023-10-18 DIAGNOSIS — I82412 Acute embolism and thrombosis of left femoral vein: Secondary | ICD-10-CM | POA: Diagnosis not present

## 2023-10-19 DIAGNOSIS — I82412 Acute embolism and thrombosis of left femoral vein: Secondary | ICD-10-CM | POA: Diagnosis not present

## 2023-10-20 DIAGNOSIS — I82412 Acute embolism and thrombosis of left femoral vein: Secondary | ICD-10-CM | POA: Diagnosis not present

## 2023-10-22 ENCOUNTER — Telehealth: Payer: Self-pay | Admitting: *Deleted

## 2023-10-22 NOTE — Transitions of Care (Post Inpatient/ED Visit) (Addendum)
 10/22/2023  Name: Melissa Zavala MRN: [id number] DOB: Aug 22, 1940  Today's TOC FU Call Status: Today's TOC FU Call Status:: Successful TOC FU Call Completed TOC FU Call Complete Date: 10/22/23 Patient's Name and Date of Birth confirmed.  Transition Care Management Follow-up Telephone Call Date of Discharge: 10/20/23 Discharge Facility: Other [facility]) Name of Other (Non[facility]) Discharge Facility: [facility] Type of Discharge: Inpatient Admission Primary Inpatient Discharge Diagnosis:: Acute deep vein thrombosis (DVT) of femoral vein of left lower extremit How have you been since you were released from the hospital?:  (eating, drinking well, ambulating without difficulty, has a cane to use if needed, has good family support) Any questions or concerns?: No  Items Reviewed: Did you receive and understand the discharge instructions provided?: Yes Medications obtained,verified, and reconciled?: Yes (Medications Reviewed) Any new allergies since your discharge?: No Dietary orders reviewed?: Yes Type of Diet Ordered:: cardiac diet Do you have support at home?: Yes People in Home [RPT]: alone Name of Support/Comfort Primary Source: adult children assist Reviewed signs/ symptoms of DVT Pain assessment completed Patient agreed to enrollment in [facility] 30 day program, today's note routed to primary care provider  Medications Reviewed Today: Medications Reviewed Today     Reviewed by Daralyn Earl, RN (Registered Nurse) on 10/22/23 at 1230  Med List Status: <None>   Medication Order Taking? Sig Documenting Provider Last Dose Status Informant  acetaminophen  (TYLENOL ) 500 MG tablet [id number] Yes Take 1,000 mg by mouth every 8 (eight) hours as needed for headache or moderate pain. Pt takes 500 mg 2 tablets as needed. [provider] Taking Active   alendronate  (FOSAMAX ) 70 MG tablet [id number] Yes TAKE ONE TABLET  EVERY 7 DAYS. TAKE WITH A FULL GLASS OF  WATER, ON AN EMPTY STOMACH. Albertha Huger, FNP Taking Active   amLODipine  (NORVASC ) 10 MG tablet [id number] Yes TAKE 1 TABLET BY MOUTH EVERY DAY Albertha Huger, FNP Taking Active   aspirin  EC 81 MG tablet [id number] Yes Take 81 mg by mouth daily. Swallow whole. [provider] Taking Active   celecoxib  (CELEBREX ) 200 MG capsule [id number] No Take 200 mg by mouth daily.  Patient not taking: Reported on 10/22/2023   [provider] Not Taking Active   esomeprazole  (NEXIUM ) 40 MG capsule [id number] Yes TAKE 1 CAPSULE BY MOUTH DAILY Albertha Huger, FNP Taking Active   Evolocumab  (REPATHA  SURECLICK) 140 MG/ML SOAJ [id number] Yes Inject 140 mg into the skin every 14 (fourteen) days. Albertha Huger, FNP Taking Active   fluticasone  (FLONASE ) 50 MCG/ACT nasal spray [id number] Yes Place 2 sprays into both nostrils daily. Albertha Huger, FNP Taking Active   furosemide  (LASIX ) 20 MG tablet [id number] Yes Take 2 tablets (40 mg total) by mouth daily. Albertha Huger, FNP Taking Active   HYDROcodone -acetaminophen  (NORCO) 5-325 MG tablet [id number] No Take 1 tablet by mouth every 8 (eight) hours as needed for moderate pain (pain score 4-6).  Patient not taking: Reported on 10/22/2023   Albertha Huger, FNP Not Taking Active   HYDROcodone -acetaminophen  [facility]) 5-325 MG tablet [id number] Yes Take 1 tablet by mouth every 8 (eight) hours as needed for moderate pain (pain score 4-6). Albertha Huger, FNP Taking Active   isosorbide  mononitrate (IMDUR ) 30 MG 24 hr tablet [id number] Yes TAKE 1/2 TABLET BY MOUTH EVERY DAY Gerald Kitty., NP Taking Active   levocetirizine (XYZAL ) 5 MG tablet [id number] Yes TAKE 1 TABLET BY MOUTH EVERY DAY Britta Candy, [place]  M, FNP Taking Active   lisinopril  (ZESTRIL ) 40 MG tablet [id number] Yes TAKE 1 TABLET(40 MG) BY MOUTH DAILY Albertha Huger, FNP Taking Active   potassium chloride  (KLOR-CON ) 10 MEQ tablet [id number] Yes TAKE ONE TABLET TWICE A DAY. Albertha Huger, FNP Taking Active   VITAMIN D  PO [id number] Yes Take 1 capsule by mouth daily. [provider] Taking Active             Home Care and Equipment/Supplies: Were Home Health Services Ordered?: Yes Name of Home Health Agency:: [facility] (t/c to [facility], spoke with Irena Manners, given the number for intake [phone], was on hold, never able to speak with anyone, left RN contact # and requested a call back, pt states she has home health # also and will call today) Has Agency set up a time to come to your home?: No Any new equipment or medical supplies ordered?: No  Functional Questionnaire: Do you need assistance with bathing/showering or dressing?: No Do you need assistance with meal preparation?: No Do you need assistance with eating?: No Do you have difficulty maintaining continence: No Do you need assistance with getting out of bed/getting out of a chair/moving?: Yes (cane) Do you have difficulty managing or taking your medications?: No  Follow up appointments reviewed: PCP Follow-up appointment confirmed?: Yes Date of PCP follow-up appointment?:  (collaborated with care guide and scheduled post hospital follow up for 10/29/23 @ 1030 am) Follow-up Provider: Lugenia Said NP  @ 1030 am [facility] Hospital Follow-up appointment confirmed?: NA Do you need transportation to your follow-up appointment?: No Do you understand care options if your condition(s) worsen?: Yes-patient verbalized understanding  SDOH Interventions Today    Flowsheet Row Most Recent Value  SDOH Interventions   Food Insecurity Interventions Intervention Not Indicated  Housing Interventions Intervention Not Indicated  Transportation Interventions Intervention Not Indicated  Utilities Interventions Intervention Not Indicated       Goals Addressed             This Visit's Progress    VBCI Transitions of Care (TOC) Care Plan       Problems:  Recent Hospitalization for  treatment of Acute DVT Home Health services barrier: [facility] home [facility] has not contacted pt and No Hospital Follow Up Provider appointment - collaborated with care guide and scheduled post hospital follow up appointment Patient lives alone, has good support from adult children, states no swelling in legs or feet but has soreness to bottom of feet when walking  Goal:  Over the next 30 days, the patient will not experience hospital readmission  Interventions:   Evaluation of current treatment plan related to DVT, signs/ symptoms self-management and patient's adherence to plan as established by provider. Discussed plans with patient for ongoing care management follow up and provided patient with direct contact information for care management team Evaluation of current treatment plan related to DVT and patient's adherence to plan as established by provider Provided education to patient re: signs/ symptoms DVT Collaborated with care guide regarding scheduling post hospital follow up appointment Reviewed scheduled/upcoming provider appointments including primary care provider on 10/29/23 @ 1030 am Discussed plans with patient for ongoing care management follow up and provided patient with direct contact information for care management team Screening for signs and symptoms of depression related to chronic disease state  Assessed social determinant of health barriers Telephone call to [facility] home health, spoke with Irena Manners who states RN CM will have to call intake @ [phone], was  unable to speak with anyone, left message requesting call back, pt states she has contact # and will also try to call today  Patient Self Care Activities:  Attend all scheduled provider appointments Attend church or other social activities Call pharmacy for medication refills 3-7 days in advance of running out of medications Call provider office for new concerns or questions  Notify RN Care Manager of TOC call  rescheduling needs Participate in Transition of Care Program/Attend TOC scheduled calls Take medications as prescribed   Follow up with primary care provider on 10/29/23 @ 1030 am [facility] contact # [phone]  Plan:  Telephone follow up appointment with care management team member scheduled for:  10/29/23 @ 1030 am with Orpha Blade RN The patient has been provided with contact information for the care management team and has been advised to call with any health related questions or concerns.         Cecilie Coffee [facility], BSN RN Care Manager/ Transition of Care [place]/ [facility] [phone]

## 2023-10-23 DIAGNOSIS — E876 Hypokalemia: Secondary | ICD-10-CM | POA: Diagnosis not present

## 2023-10-23 DIAGNOSIS — K219 Gastro-esophageal reflux disease without esophagitis: Secondary | ICD-10-CM | POA: Diagnosis not present

## 2023-10-23 DIAGNOSIS — I10 Essential (primary) hypertension: Secondary | ICD-10-CM | POA: Diagnosis not present

## 2023-10-23 DIAGNOSIS — I82413 Acute embolism and thrombosis of femoral vein, bilateral: Secondary | ICD-10-CM | POA: Diagnosis not present

## 2023-10-23 DIAGNOSIS — Z7901 Long term (current) use of anticoagulants: Secondary | ICD-10-CM | POA: Diagnosis not present

## 2023-10-23 DIAGNOSIS — Z7982 Long term (current) use of aspirin: Secondary | ICD-10-CM | POA: Diagnosis not present

## 2023-10-24 ENCOUNTER — Telehealth: Payer: Self-pay

## 2023-10-24 NOTE — Telephone Encounter (Signed)
 LMOVM giving VO for PT frequency

## 2023-10-24 NOTE — Telephone Encounter (Signed)
 Copied from CRM [id number]. Topic: Clinical - Home Health Verbal Orders >> Oct 24, 2023 10:43 AM Hassie Lint wrote: Caller/Agency: [facility] Callback Number: [phone] Service Requested: Physical Therapy Frequency: 2 times a week for 3 weeks and then 1 time a week for 5 weeks Any new concerns about the patient? No

## 2023-10-25 DIAGNOSIS — I10 Essential (primary) hypertension: Secondary | ICD-10-CM | POA: Diagnosis not present

## 2023-10-25 DIAGNOSIS — Z7901 Long term (current) use of anticoagulants: Secondary | ICD-10-CM | POA: Diagnosis not present

## 2023-10-25 DIAGNOSIS — Z7982 Long term (current) use of aspirin: Secondary | ICD-10-CM | POA: Diagnosis not present

## 2023-10-25 DIAGNOSIS — K219 Gastro-esophageal reflux disease without esophagitis: Secondary | ICD-10-CM | POA: Diagnosis not present

## 2023-10-25 DIAGNOSIS — E876 Hypokalemia: Secondary | ICD-10-CM | POA: Diagnosis not present

## 2023-10-25 DIAGNOSIS — I82413 Acute embolism and thrombosis of femoral vein, bilateral: Secondary | ICD-10-CM | POA: Diagnosis not present

## 2023-10-29 ENCOUNTER — Ambulatory Visit: Admitting: Family Medicine

## 2023-10-29 VITALS — BP 118/62 | HR 88 | Temp 98.4°F | Ht 60.0 in | Wt 193.4 lb

## 2023-10-29 DIAGNOSIS — I82412 Acute embolism and thrombosis of left femoral vein: Secondary | ICD-10-CM | POA: Diagnosis not present

## 2023-10-29 DIAGNOSIS — I10 Essential (primary) hypertension: Secondary | ICD-10-CM

## 2023-10-29 DIAGNOSIS — M545 Low back pain, unspecified: Secondary | ICD-10-CM | POA: Diagnosis not present

## 2023-10-29 DIAGNOSIS — E876 Hypokalemia: Secondary | ICD-10-CM

## 2023-10-29 DIAGNOSIS — R600 Localized edema: Secondary | ICD-10-CM | POA: Diagnosis not present

## 2023-10-29 DIAGNOSIS — Z09 Encounter for follow-up examination after completed treatment for conditions other than malignant neoplasm: Secondary | ICD-10-CM

## 2023-10-29 DIAGNOSIS — R5381 Other malaise: Secondary | ICD-10-CM | POA: Diagnosis not present

## 2023-10-29 LAB — URINALYSIS, ROUTINE W REFLEX MICROSCOPIC
Bilirubin, UA: NEGATIVE
Glucose, UA: NEGATIVE
Ketones, UA: NEGATIVE
Leukocytes,UA: NEGATIVE
Nitrite, UA: NEGATIVE
Protein,UA: NEGATIVE
RBC, UA: NEGATIVE
Specific Gravity, UA: 1.01 (ref 1.005–1.030)
Urobilinogen, Ur: 0.2 mg/dL (ref 0.2–1.0)
pH, UA: 6.5 (ref 5.0–7.5)

## 2023-10-29 NOTE — Progress Notes (Signed)
 Established Patient Office Visit  Subjective   Patient ID: IVON ROEDEL, female    DOB: Sep 25, 1940  Age: 83 y.o. MRN: [id number]  Chief Complaint  Patient presents with   DVT    HPI  Today's visit was for Transitional Care Management.  The patient was discharged from [facility] on 10/20/23 with a primary diagnosis of acute DVT of left femoral vein.   Contact with the patient and/or caregiver, by a clinical staff member, was made on 10/22/23 and was documented as a telephone encounter within the EMR.  Through chart review and discussion with the patient I have determined that management of their condition is of {Desc; low/moderate/high:110033} complexity.   Zarea was admitted on 10/19/23. Per discharge summary:  History of Present Illness:  Alesa Echevarria is a 83 y.o. female with PMH significant for HTN who presented to [facility] on 10/18/23 complaining of bilateral lower extremity pain with increased swelling. Of note, the patient reported that a few weeks ago she began having L lower extremity pain and swelling, specifically in her ankle. She was sent to an ED for xrays for possible fracture which was negative. The pain and swelling continued to worsen so she presented to her PCP who sent her to a different ED for an US  of her L leg which was also negative, so she was started on abx for cellulitis. She states the pain and swelling would wax and wane following this, but did not improve fully. She went back to her PCP after completing the abx and this time had worsening R leg pain, so she was sent to the ED for an US  of her right leg, which was negative. Following this, they switched her abx.   She states her pain right now is 7/10 in both legs, starting at her ankles and going up into her thighs. The pain is significantly worse with movement or to the touch. She states she did travel from [place]  about a month ago before the pain in her L leg started which was about an 8 hour  drive. She states they stopped multiple times though to walk around. She denies tobacco use. She has never had a blood clot before. She reports an episode of bilateral cellulitis a few years back.   She states she has been fully compliant with lasix  but has had increase in swelling bilaterally from her baseline for the past few weeks.   Denies CP, SOB, hemoptysis, N/V/D. She states she has felt nervous and sick to her stomach for a few weeks based on her pain.   ED administered treatment dose lovenox , IV lasix , and oxycodone .   Initial ED vitals: afebrile, BP 173/82, HR 81, RR 20, O2 96% on RA   Pertinent labs: - CBC with chronic normocytic anemia (hgb 10.7) - CMP with hypokalemia at 3.4 (chronic) - normal NT-proBNP  Pertinent imaging: - US  venous LE: DVT within L femoral vein  - Cxr with mild cardiomegaly   Assessment/Plan:  Jaleeyah Munce is a 84 y.o. 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  Plan - Replace with 40 mEq BID and monitor due to ongoing diuresis   #Deconditioning, likely due to pain due to above  PT/OT consulted Case management consulted   #GERD PPI  She reports improved pain in both legs now. Denies fever, chest pain, or shortness of breath. Edema has improved. No erythema now. She has started home PT. They will coming out 2x a week.  Increased lower back pain on right side x 2 days. Constant dull, achy  pain. Doesn't radiate. No urinary symptoms.   {History (Optional):23778}  ROS    Objective:     BP 118/62   Pulse 88   Temp 98.4 F (36.9 C) (Temporal)   Ht 5' (1.524 m)   Wt 193 lb 6.4 oz (87.7 kg)   SpO2 94%   BMI 37.77 kg/m  {Vitals History (Optional):23777}  Physical Exam   No results found for any visits on 10/29/23.  {Labs (Optional):23779}  The ASCVD Risk score (Arnett DK, et al., 2019) failed to calculate for the following reasons:   The 2019 ASCVD risk score is only valid for ages 27 to 39    Assessment & Plan:   Problem List Items Addressed This Visit   None   No follow-ups on file.    Albertha Huger, FNP

## 2023-10-30 ENCOUNTER — Other Ambulatory Visit: Payer: Self-pay

## 2023-10-30 DIAGNOSIS — E876 Hypokalemia: Secondary | ICD-10-CM | POA: Diagnosis not present

## 2023-10-30 DIAGNOSIS — K219 Gastro-esophageal reflux disease without esophagitis: Secondary | ICD-10-CM | POA: Diagnosis not present

## 2023-10-30 DIAGNOSIS — I10 Essential (primary) hypertension: Secondary | ICD-10-CM | POA: Diagnosis not present

## 2023-10-30 DIAGNOSIS — I82413 Acute embolism and thrombosis of femoral vein, bilateral: Secondary | ICD-10-CM | POA: Diagnosis not present

## 2023-10-30 DIAGNOSIS — Z7901 Long term (current) use of anticoagulants: Secondary | ICD-10-CM | POA: Diagnosis not present

## 2023-10-30 DIAGNOSIS — Z7982 Long term (current) use of aspirin: Secondary | ICD-10-CM | POA: Diagnosis not present

## 2023-10-30 LAB — CBC WITH DIFFERENTIAL/PLATELET
Basophils Absolute: 0 10*3/uL (ref 0.0–0.2)
Basos: 0 %
EOS (ABSOLUTE): 0.2 10*3/uL (ref 0.0–0.4)
Eos: 2 %
Hematocrit: 35.3 % (ref 34.0–46.6)
Hemoglobin: 10.7 g/dL — ABNORMAL LOW (ref 11.1–15.9)
Immature Grans (Abs): 0 10*3/uL (ref 0.0–0.1)
Immature Granulocytes: 0 %
Lymphocytes Absolute: 2.1 10*3/uL (ref 0.7–3.1)
Lymphs: 29 %
MCH: 26.8 pg (ref 26.6–33.0)
MCHC: 30.3 g/dL — ABNORMAL LOW (ref 31.5–35.7)
MCV: 89 fL (ref 79–97)
Monocytes Absolute: 0.5 10*3/uL (ref 0.1–0.9)
Monocytes: 6 %
Neutrophils Absolute: 4.5 10*3/uL (ref 1.4–7.0)
Neutrophils: 63 %
Platelets: 335 10*3/uL (ref 150–450)
RBC: 3.99 x10E6/uL (ref 3.77–5.28)
RDW: 12.4 % (ref 11.7–15.4)
WBC: 7.3 10*3/uL (ref 3.4–10.8)

## 2023-10-30 LAB — BMP8+EGFR
BUN/Creatinine Ratio: 21 (ref 12–28)
BUN: 15 mg/dL (ref 8–27)
CO2: 23 mmol/L (ref 20–29)
Calcium: 10.2 mg/dL (ref 8.7–10.3)
Chloride: 102 mmol/L (ref 96–106)
Creatinine, Ser: 0.71 mg/dL (ref 0.57–1.00)
Glucose: 104 mg/dL — ABNORMAL HIGH (ref 70–99)
Potassium: 4.1 mmol/L (ref 3.5–5.2)
Sodium: 137 mmol/L (ref 134–144)
eGFR: 85 mL/min/{1.73_m2} (ref >59)

## 2023-10-30 NOTE — Transitions of Care (Post Inpatient/ED Visit) (Signed)
 Transition of Care week 2  Visit Note  10/30/2023  Name: Melissa Zavala MRN: [id number]          DOB: 1940/08/23  Situation: Patient enrolled in [facility] 30-day program. Visit completed with patient by telephone.   Background: Recent admission for DVT  Initial Transition Care Management Follow-up Telephone Call    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   Osteoporosis    Vitamin B12 deficiency 12/06/2017    Assessment: Patient Reported Symptoms: Cognitive Cognitive Status: Able to follow simple commands, Alert and oriented to person, place, and time, Normal speech and language skills      Neurological Neurological Review of Symptoms: No symptoms reported    HEENT HEENT Symptoms Reported: No symptoms reported      Cardiovascular Cardiovascular Symptoms Reported: Other: Other Cardiovascular Symptoms: Reports decrease in pain of her calf from DVT.  Reports no swelling. Does patient have uncontrolled Hypertension?: No Cardiovascular Self-Management Outcome: 4 (good) Cardiovascular Comment: Continues to take her medications as prescribed.  Respiratory Respiratory Symptoms Reported: No symptoms reported    Endocrine Patient reports the following symptoms related to hypoglycemia or hyperglycemia : No symptoms reported Is patient diabetic?: No    Gastrointestinal Gastrointestinal Symptoms Reported: No symptoms reported      Genitourinary Genitourinary Symptoms Reported: Other Other Genitourinary Symptoms: reports right flank pain. denies any dysuria, frequency Genitourinary Self-Management Outcome: 3 (uncertain)  Integumentary Integumentary Symptoms Reported: No symptoms reported    Musculoskeletal Musculoskelatal Symptoms Reviewed: Other Other Musculoskeletal Symptoms: continues to use her cane for ambulation        Psychosocial Psychosocial Symptoms Reported: No symptoms reported         There were no vitals  filed for this visit.  Medications Reviewed Today     Reviewed by Vanetta Generous, RN (Registered Nurse) on 10/30/23 at 1059  Med List Status: <None>   Medication Order Taking? Sig Documenting Provider Last Dose Status Informant  acetaminophen  (TYLENOL ) 500 MG tablet [id number] Yes Take 1,000 mg by mouth every 8 (eight) hours as needed for headache or moderate pain. Pt takes 500 mg 2 tablets as needed. [provider]  Active   alendronate  (FOSAMAX ) 70 MG tablet [id number] Yes TAKE ONE TABLET  EVERY 7 DAYS. TAKE WITH A FULL GLASS OF WATER, ON AN EMPTY STOMACH. Albertha Huger, FNP  Active   amLODipine  (NORVASC ) 10 MG tablet [id number] Yes TAKE 1 TABLET BY MOUTH EVERY DAY Albertha Huger, FNP  Active   apixaban (ELIQUIS) 5 MG TABS tablet [id number] Yes Take 5 mg by mouth 2 (two) times daily. [provider]  Active   aspirin  EC 81 MG tablet [id number] Yes Take 81 mg by mouth daily. Swallow whole. [provider]  Active   esomeprazole  (NEXIUM ) 40 MG capsule [id number] Yes TAKE 1 CAPSULE BY MOUTH DAILY Albertha Huger, FNP  Active   Evolocumab  (REPATHA  SURECLICK) 140 MG/ML SOAJ [id number] Yes Inject 140 mg into the skin every 14 (fourteen) days. Albertha Huger, FNP  Active   fluticasone  (FLONASE ) 50 MCG/ACT nasal spray [id number] Yes Place 2 sprays into both nostrils daily. Albertha Huger, FNP  Active   furosemide  (LASIX ) 20 MG tablet [id number] Yes Take 2 tablets (40 mg total) by mouth daily. Albertha Huger, FNP  Active   HYDROcodone -acetaminophen  (NORCO) 5-325 MG tablet [id number] Yes Take 1 tablet by mouth every 8 (eight) hours as needed for moderate  pain (pain score 4-6). Albertha Huger, FNP  Active   HYDROcodone -acetaminophen  [facility]) 5-325 MG tablet [id number] Yes Take 1 tablet by mouth every 8 (eight) hours as needed for moderate pain (pain score 4-6). Albertha Huger, FNP  Active   isosorbide  mononitrate (IMDUR ) 30 MG 24 hr tablet [id number] Yes TAKE 1/2  TABLET BY MOUTH EVERY DAY Dick, Ernest H Jr., NP  Active   levocetirizine (XYZAL ) 5 MG tablet [id number] Yes TAKE 1 TABLET BY MOUTH EVERY DAY Albertha Huger, FNP  Active   lisinopril  (ZESTRIL ) 40 MG tablet [id number] Yes TAKE 1 TABLET(40 MG) BY MOUTH DAILY Albertha Huger, FNP  Active   potassium chloride  (KLOR-CON ) 10 MEQ tablet [id number] Yes TAKE ONE TABLET TWICE A DAY. Albertha Huger, FNP  Active   VITAMIN D  PO [id number] Yes Take 1 capsule by mouth daily. [provider]  Active             Recommendation:   PCP Follow-up as directed  Follow Up Plan:   Telephone follow up appointment date/time:  11/06/2023  at 11 15 am  Orpha Blade, RN, BSN, [employer]- Transition of Care Team.  Value Based [employer] [phone]

## 2023-10-31 ENCOUNTER — Ambulatory Visit: Payer: Self-pay | Admitting: Family Medicine

## 2023-10-31 DIAGNOSIS — Z7901 Long term (current) use of anticoagulants: Secondary | ICD-10-CM | POA: Diagnosis not present

## 2023-10-31 DIAGNOSIS — K219 Gastro-esophageal reflux disease without esophagitis: Secondary | ICD-10-CM | POA: Diagnosis not present

## 2023-10-31 DIAGNOSIS — Z7982 Long term (current) use of aspirin: Secondary | ICD-10-CM | POA: Diagnosis not present

## 2023-10-31 DIAGNOSIS — I82413 Acute embolism and thrombosis of femoral vein, bilateral: Secondary | ICD-10-CM | POA: Diagnosis not present

## 2023-10-31 DIAGNOSIS — E876 Hypokalemia: Secondary | ICD-10-CM | POA: Diagnosis not present

## 2023-10-31 DIAGNOSIS — I10 Essential (primary) hypertension: Secondary | ICD-10-CM | POA: Diagnosis not present

## 2023-11-01 ENCOUNTER — Encounter: Payer: Self-pay | Admitting: Family Medicine

## 2023-11-01 DIAGNOSIS — K219 Gastro-esophageal reflux disease without esophagitis: Secondary | ICD-10-CM | POA: Diagnosis not present

## 2023-11-01 DIAGNOSIS — Z7901 Long term (current) use of anticoagulants: Secondary | ICD-10-CM | POA: Diagnosis not present

## 2023-11-01 DIAGNOSIS — E876 Hypokalemia: Secondary | ICD-10-CM | POA: Diagnosis not present

## 2023-11-01 DIAGNOSIS — Z7982 Long term (current) use of aspirin: Secondary | ICD-10-CM | POA: Diagnosis not present

## 2023-11-01 DIAGNOSIS — I10 Essential (primary) hypertension: Secondary | ICD-10-CM | POA: Diagnosis not present

## 2023-11-01 DIAGNOSIS — I82413 Acute embolism and thrombosis of femoral vein, bilateral: Secondary | ICD-10-CM | POA: Diagnosis not present

## 2023-11-05 ENCOUNTER — Ambulatory Visit

## 2023-11-05 DIAGNOSIS — Z7901 Long term (current) use of anticoagulants: Secondary | ICD-10-CM | POA: Diagnosis not present

## 2023-11-05 DIAGNOSIS — Z7982 Long term (current) use of aspirin: Secondary | ICD-10-CM | POA: Diagnosis not present

## 2023-11-05 DIAGNOSIS — I10 Essential (primary) hypertension: Secondary | ICD-10-CM

## 2023-11-05 DIAGNOSIS — E876 Hypokalemia: Secondary | ICD-10-CM | POA: Diagnosis not present

## 2023-11-05 DIAGNOSIS — I82413 Acute embolism and thrombosis of femoral vein, bilateral: Secondary | ICD-10-CM

## 2023-11-05 DIAGNOSIS — K219 Gastro-esophageal reflux disease without esophagitis: Secondary | ICD-10-CM | POA: Diagnosis not present

## 2023-11-06 ENCOUNTER — Other Ambulatory Visit: Payer: Self-pay | Admitting: *Deleted

## 2023-11-06 DIAGNOSIS — E876 Hypokalemia: Secondary | ICD-10-CM | POA: Diagnosis not present

## 2023-11-06 DIAGNOSIS — Z7982 Long term (current) use of aspirin: Secondary | ICD-10-CM | POA: Diagnosis not present

## 2023-11-06 DIAGNOSIS — Z7901 Long term (current) use of anticoagulants: Secondary | ICD-10-CM | POA: Diagnosis not present

## 2023-11-06 DIAGNOSIS — K219 Gastro-esophageal reflux disease without esophagitis: Secondary | ICD-10-CM | POA: Diagnosis not present

## 2023-11-06 DIAGNOSIS — I82413 Acute embolism and thrombosis of femoral vein, bilateral: Secondary | ICD-10-CM | POA: Diagnosis not present

## 2023-11-06 DIAGNOSIS — I10 Essential (primary) hypertension: Secondary | ICD-10-CM | POA: Diagnosis not present

## 2023-11-06 NOTE — Transitions of Care (Post Inpatient/ED Visit) (Signed)
 Transition of Care week 3  Visit Note  11/06/2023  Name: Melissa Zavala MRN: [id number]          DOB: 04/09/1941  Situation: Patient enrolled in [facility] 30-day program. Visit completed with patient by telephone.   Background:    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   Osteoporosis    Vitamin B12 deficiency 12/06/2017    Assessment: Patient Reported Symptoms: Cognitive Cognitive Status: Able to follow simple commands, Alert and oriented to person, place, and time, Normal speech and language skills      Neurological Neurological Review of Symptoms: No symptoms reported    HEENT HEENT Symptoms Reported: No symptoms reported      Cardiovascular Cardiovascular Symptoms Reported: No symptoms reported    Respiratory Respiratory Symptoms Reported: No symptoms reported    Endocrine Patient reports the following symptoms related to hypoglycemia or hyperglycemia : No symptoms reported Is patient diabetic?: No    Gastrointestinal Gastrointestinal Symptoms Reported: No symptoms reported      Genitourinary Genitourinary Symptoms Reported: No symptoms reported Other Genitourinary Symptoms: reports right flank pain has resolved    Integumentary Integumentary Symptoms Reported: No symptoms reported    Musculoskeletal Musculoskelatal Symptoms Reviewed: Unsteady gait Other Musculoskeletal Symptoms: uses cane as needed, continues working with PT/ OT Musculoskeletal Self-Management Outcome: 4 (good)      Psychosocial    No symptoms reported       There were no vitals filed for this visit.  Medications Reviewed Today     Reviewed by Aura Mliss LABOR, RN (Registered Nurse) on 11/06/23 at 1121  Med List Status: <None>   Medication Order Taking? Sig Documenting Provider Last Dose Status Informant  acetaminophen  (TYLENOL ) 500 MG tablet [id number]  Take 1,000 mg by mouth every 8 (eight) hours as needed for headache or moderate  pain. Pt takes 500 mg 2 tablets as needed. [provider]  Active   alendronate  (FOSAMAX ) 70 MG tablet [id number]  TAKE ONE TABLET  EVERY 7 DAYS. TAKE WITH A FULL GLASS OF WATER, ON AN EMPTY STOMACH. Joesph Annabella HERO, FNP  Active   amLODipine  (NORVASC ) 10 MG tablet [id number]  TAKE 1 TABLET BY MOUTH EVERY DAY Joesph Annabella HERO, FNP  Active   apixaban (ELIQUIS) 5 MG TABS tablet [id number]  Take 5 mg by mouth 2 (two) times daily. [provider]  Active   aspirin  EC 81 MG tablet [id number]  Take 81 mg by mouth daily. Swallow whole. [provider]  Active   esomeprazole  (NEXIUM ) 40 MG capsule [id number]  TAKE 1 CAPSULE BY MOUTH DAILY Joesph Annabella HERO, FNP  Active   Evolocumab  (REPATHA  SURECLICK) 140 MG/ML SOAJ [id number]  Inject 140 mg into the skin every 14 (fourteen) days. Joesph Annabella HERO, FNP  Active   fluticasone  (FLONASE ) 50 MCG/ACT nasal spray [id number]  Place 2 sprays into both nostrils daily. Joesph Annabella HERO, FNP  Active   furosemide  (LASIX ) 20 MG tablet [id number]  Take 2 tablets (40 mg total) by mouth daily. Joesph Annabella HERO, FNP  Active   HYDROcodone -acetaminophen  (NORCO) 5-325 MG tablet [id number]  Take 1 tablet by mouth every 8 (eight) hours as needed for moderate pain (pain score 4-6). Joesph Annabella HERO, FNP  Active   HYDROcodone -acetaminophen  (NORCO) 5-325 MG tablet [id number]  Take 1 tablet by mouth every 8 (eight) hours as needed for moderate pain (pain score 4-6). Joesph Annabella HERO, FNP  Active  isosorbide  mononitrate (IMDUR ) 30 MG 24 hr tablet [id number]  TAKE 1/2 TABLET BY MOUTH EVERY DAY Dick, Ernest H Jr., NP  Active   levocetirizine (XYZAL ) 5 MG tablet [id number]  TAKE 1 TABLET BY MOUTH EVERY DAY Joesph Annabella HERO, FNP  Active   lisinopril  (ZESTRIL ) 40 MG tablet [id number]  TAKE 1 TABLET(40 MG) BY MOUTH DAILY Joesph Annabella HERO, FNP  Active   potassium chloride  (KLOR-CON ) 10 MEQ tablet [id number]  TAKE ONE TABLET TWICE A DAY. Joesph Annabella HERO, FNP  Active    VITAMIN D  PO [id number]  Take 1 capsule by mouth daily. [provider]  Active             Recommendation:   PCP Follow-up Continue Current Plan of Care- home health PT/ OT  Follow Up Plan:   Telephone follow-up 11/13/23 @ 115 pm  Mliss Creed [facility], BSN RN Care Manager/ Transition of Care [place]/ [facility] [phone]

## 2023-11-06 NOTE — Patient Instructions (Signed)
 Visit Information  Thank you for taking time to visit with me today. Please don't hesitate to contact me if I can be of assistance to you before our next scheduled telephone appointment.  Our next appointment is by telephone on 7125 at 115 pm  Following is a copy of your care plan:   Goals Addressed             This Visit's Progress    VBCI Transitions of Care (TOC) Care Plan       .......................................................................................................................................................................................................SABRAProblems:  Recent Hospitalization for treatment of Acute DVT  10/30/2023 Patient reports that she is not having any pain today and is taking her medications as prescribed. Continues to use her cane.  Home Health services barrier: [facility] home [facility] has not contacted pt and No Hospital Follow Up Provider appointment - collaborated with care guide and scheduled post hospital follow up appointment  10/30/2023  Home health is active and has completed 2 visits and will have another session today.  Patient lives alone, has good support from adult children, states no swelling in legs or feet but has soreness to bottom of feet when walking.  10/30/2023  reports no concerns for social support.  6/24- pt reports home health PT/ OT continue to work with her, no new concerns reported, has all medications and taking as prescribed  Goal:  Over the next 30 days, the patient will not experience hospital readmission  Interventions:  Pain assessment Reviewed follow up with PCP 12/10/23 @ 2 pm Encouraged patient to continue to take all her medications as prescribed. Call MD for any changes in condition.  Reinforced signs and symptoms of bleeding Reinforced fall prevention  Patient Self Care Activities:  Attend all scheduled provider appointments Attend church or other social activities Call pharmacy for medication refills  3-7 days in advance of running out of medications Call provider office for new concerns or questions  Notify RN Care Manager of TOC call rescheduling needs Participate in Transition of Care Program/Attend TOC scheduled calls Take medications as prescribed   Continue to participate in PT sessions Seek help if you fall Plan:  Telephone follow up appointment with care management team member scheduled for:   11/13/23 @ 115 pm The patient has been provided with contact information for the care management team and has been advised to call with any health related questions or concerns.         Patient verbalizes understanding of instructions and care plan provided today and agrees to view in MyChart. Active MyChart status and patient understanding of how to access instructions and care plan via MyChart confirmed with patient.     Telephone follow up appointment with care management team member scheduled for: 11/13/23 @ 115 pm  Please call the care guide team at [phone] if you need to cancel or reschedule your appointment.   Please call the Suicide and Crisis Lifeline: 988 call the [place]  National Suicide Prevention Lifeline: 415-562-9262 or TTY: (859)353-4219 TTY 520-353-1083) to talk to a trained counselor call 1-800-273-TALK (toll free, 24 hour hotline) go to [facility] [address] [place] [phone]) call the [facility] Crisis Line: [phone] call 911 if you are experiencing a Mental Health or Behavioral Health Crisis or need someone to talk to.  Mliss Creed [facility], BSN RN Care Manager/ Transition of Care [place]/ [facility] [phone]

## 2023-11-13 ENCOUNTER — Other Ambulatory Visit: Payer: Self-pay | Admitting: *Deleted

## 2023-11-13 NOTE — Patient Outreach (Addendum)
 Transition of Care week 4  Visit Note  11/13/2023  Name: Melissa Zavala MRN: [id number]          DOB: 02-May-1941  Situation: Patient enrolled in [facility] 30-day program. Visit completed with patient by telephone.   Background:    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   Osteoporosis    Vitamin B12 deficiency 12/06/2017    Assessment: Patient Reported Symptoms: Cognitive Cognitive Status: No symptoms reported, Able to follow simple commands, Normal speech and language skills      Neurological Neurological Review of Symptoms: No symptoms reported    HEENT HEENT Symptoms Reported: No symptoms reported      Cardiovascular Cardiovascular Symptoms Reported: No symptoms reported Does patient have uncontrolled Hypertension?: No    Respiratory Respiratory Symptoms Reported: No symptoms reported    Endocrine Endocrine Symptoms Reported: No symptoms reported Is patient diabetic?: No    Gastrointestinal Gastrointestinal Symptoms Reported: No symptoms reported      Genitourinary Genitourinary Symptoms Reported: No symptoms reported    Integumentary Integumentary Symptoms Reported: No symptoms reported    Musculoskeletal Musculoskelatal Symptoms Reviewed: Unsteady gait Other Musculoskeletal Symptoms: uses cane as needed, continues working with home health PT Musculoskeletal Self-Management Outcome: 4 (good)      Psychosocial Psychosocial Symptoms Reported: No symptoms reported         There were no vitals filed for this visit.  Medications Reviewed Today     Reviewed by Aura Mliss LABOR, RN (Registered Nurse) on 11/13/23 at 1028  Med List Status: <None>   Medication Order Taking? Sig Documenting Provider Last Dose Status Informant  acetaminophen  (TYLENOL ) 500 MG tablet [id number]  Take 1,000 mg by mouth every 8 (eight) hours as needed for headache or moderate pain. Pt takes 500 mg 2 tablets as needed. [provider]  Active   alendronate  (FOSAMAX ) 70 MG tablet [id number]  TAKE ONE TABLET  EVERY 7 DAYS. TAKE WITH A FULL GLASS OF WATER, ON AN EMPTY STOMACH. Joesph Annabella HERO, FNP  Active   amLODipine  (NORVASC ) 10 MG tablet [id number]  TAKE 1 TABLET BY MOUTH EVERY DAY Joesph Annabella HERO, FNP  Active   apixaban (ELIQUIS) 5 MG TABS tablet [id number]  Take 5 mg by mouth 2 (two) times daily. [provider]  Active   aspirin  EC 81 MG tablet [id number]  Take 81 mg by mouth daily. Swallow whole. [provider]  Active   esomeprazole  (NEXIUM ) 40 MG capsule [id number]  TAKE 1 CAPSULE BY MOUTH DAILY Joesph Annabella HERO, FNP  Active   Evolocumab  (REPATHA  SURECLICK) 140 MG/ML SOAJ [id number]  Inject 140 mg into the skin every 14 (fourteen) days. Joesph Annabella HERO, FNP  Active   fluticasone  (FLONASE ) 50 MCG/ACT nasal spray [id number]  Place 2 sprays into both nostrils daily. Joesph Annabella HERO, FNP  Active   furosemide  (LASIX ) 20 MG tablet [id number]  Take 2 tablets (40 mg total) by mouth daily. Joesph Annabella HERO, FNP  Active   HYDROcodone -acetaminophen  (NORCO) 5-325 MG tablet [id number]  Take 1 tablet by mouth every 8 (eight) hours as needed for moderate pain (pain score 4-6). Joesph Annabella HERO, FNP  Active   isosorbide  mononitrate (IMDUR ) 30 MG 24 hr tablet [id number]  TAKE 1/2 TABLET BY MOUTH EVERY DAY Dick, Ernest H Jr., NP  Active   levocetirizine (XYZAL ) 5 MG tablet [id number]  TAKE 1 TABLET BY MOUTH EVERY DAY Joesph Annabella HERO, FNP  Active   lisinopril  (ZESTRIL ) 40 MG tablet [id number]  TAKE 1 TABLET(40 MG) BY MOUTH DAILY Joesph Annabella HERO, FNP  Active   potassium chloride  (KLOR-CON ) 10 MEQ tablet [id number]  TAKE ONE TABLET TWICE A DAY. Joesph Annabella HERO, FNP  Active   VITAMIN D  PO [id number]  Take 1 capsule by mouth daily. [provider]  Active             Goals Addressed             This Visit's Progress    VBCI Transitions of Care (TOC) Care Plan       Problems:  Recent  Hospitalization for treatment of Acute DVT  10/30/2023    Patient lives alone, has good support from adult children, states no swelling in legs or feet but has soreness to bottom of feet when walking.  10/30/2023  reports no concerns for social support.  11/13/23- pt reports home health PT/ OT continue to work with her, she feels this is beneficial, no new concerns reported, has all medications and taking as prescribed Goal:  Over the next 30 days, the patient will not experience hospital readmission  Interventions:  Pain assessment Reviewed follow up with PCP 12/10/23 @ 2 pm Encouraged patient to continue to take all her medications as prescribed. Call MD for any changes in condition.  Reviewed signs/ symptoms DVT, reportable signs/ symptoms Reinforced signs and symptoms of bleeding Reviewed fall prevention/ safety precautions  Patient Self Care Activities:  Attend all scheduled provider appointments Attend church or other social activities Call pharmacy for medication refills 3-7 days in advance of running out of medications Call provider office for new concerns or questions  Notify RN Care Manager of TOC call rescheduling needs Participate in Transition of Care Program/Attend TOC scheduled calls Take medications as prescribed   Continue to participate in PT sessions Seek help if you fall Plan:  Telephone follow up appointment with care management team member scheduled for:   11/20/23 @ 215 pm The patient has been provided with contact information for the care management team and has been advised to call with any health related questions or concerns.         Recommendation:   PCP Follow-up Continue Current Plan of Care  Follow Up Plan:   Telephone follow-up 11/20/23 @ 215 pm  Mliss Creed [facility], BSN RN Care Manager/ Transition of Care [place]/ [facility] [phone]

## 2023-11-13 NOTE — Patient Instructions (Addendum)
 Visit Information  Thank you for taking time to visit with me today. Please don't hesitate to contact me if I can be of assistance to you before our next scheduled telephone appointment.  Our next appointment is by telephone on 11/20/23 at 215 pm  Following is a copy of your care plan:   Goals Addressed             This Visit's Progress    VBCI Transitions of Care (TOC) Care Plan       Problems:  Recent Hospitalization for treatment of Acute DVT  10/30/2023    Patient lives alone, has good support from adult children, states no swelling in legs or feet but has soreness to bottom of feet when walking.  10/30/2023  reports no concerns for social support.  11/13/23- pt reports home health PT/ OT continue to work with her, she feels this is beneficial, no new concerns reported, has all medications and taking as prescribed Goal:  Over the next 30 days, the patient will not experience hospital readmission  Interventions:  Pain assessment Reviewed follow up with PCP 12/10/23 @ 2 pm Encouraged patient to continue to take all her medications as prescribed. Call MD for any changes in condition.  Reviewed signs/ symptoms DVT, reportable signs/ symptoms Reinforced signs and symptoms of bleeding Reviewed fall prevention/ safety precautions  Patient Self Care Activities:  Attend all scheduled provider appointments Attend church or other social activities Call pharmacy for medication refills 3-7 days in advance of running out of medications Call provider office for new concerns or questions  Notify RN Care Manager of TOC call rescheduling needs Participate in Transition of Care Program/Attend TOC scheduled calls Take medications as prescribed   Continue to participate in PT sessions Seek help if you fall Plan:  Telephone follow up appointment with care management team member scheduled for:   11/20/23 @ 215 pm The patient has been provided with contact information for the care management team and  has been advised to call with any health related questions or concerns.         Patient verbalizes understanding of instructions and care plan provided today and agrees to view in MyChart. Active MyChart status and patient understanding of how to access instructions and care plan via MyChart confirmed with patient.     Telephone follow up appointment with care management team member scheduled for: 11/20/23 @ 215 pm  Please call the care guide team at [phone] if you need to cancel or reschedule your appointment.   Please call the Suicide and Crisis Lifeline: 988 call the [place]  National Suicide Prevention Lifeline: 623-468-7931 or TTY: 651-294-9208 TTY 915-044-4763) to talk to a trained counselor call 1-800-273-TALK (toll free, 24 hour hotline) go to [facility] [address] [place] [phone]) call the [facility] Crisis Line: [phone] call 911 if you are experiencing a Mental Health or Behavioral Health Crisis or need someone to talk to.  Mliss Creed [facility], BSN RN Care Manager/ Transition of Care [place]/ [facility] [phone]

## 2023-11-15 DIAGNOSIS — K219 Gastro-esophageal reflux disease without esophagitis: Secondary | ICD-10-CM | POA: Diagnosis not present

## 2023-11-15 DIAGNOSIS — I10 Essential (primary) hypertension: Secondary | ICD-10-CM | POA: Diagnosis not present

## 2023-11-15 DIAGNOSIS — I82413 Acute embolism and thrombosis of femoral vein, bilateral: Secondary | ICD-10-CM | POA: Diagnosis not present

## 2023-11-15 DIAGNOSIS — Z7982 Long term (current) use of aspirin: Secondary | ICD-10-CM | POA: Diagnosis not present

## 2023-11-15 DIAGNOSIS — E876 Hypokalemia: Secondary | ICD-10-CM | POA: Diagnosis not present

## 2023-11-15 DIAGNOSIS — Z7901 Long term (current) use of anticoagulants: Secondary | ICD-10-CM | POA: Diagnosis not present

## 2023-11-19 ENCOUNTER — Other Ambulatory Visit: Payer: Self-pay | Admitting: Family Medicine

## 2023-11-19 ENCOUNTER — Telehealth: Payer: Self-pay | Admitting: Family Medicine

## 2023-11-19 DIAGNOSIS — I82412 Acute embolism and thrombosis of left femoral vein: Secondary | ICD-10-CM

## 2023-11-19 MED ORDER — APIXABAN 5 MG PO TABS: 5.0000 mg | ORAL_TABLET | Freq: Two times a day (BID) | ORAL | 3 refills | Status: DC

## 2023-11-19 NOTE — Telephone Encounter (Signed)
  Prescription Request  11/19/2023  Is this a Controlled Substance medicine? no  Have you seen your PCP in the last 2 weeks? Has appt in july  If YES, route message to pool  -  If NO, patient needs to be scheduled for appointment.  What is the name of the medication or equipment? eliquis   Have you contacted your pharmacy to request a refill? yes   Which pharmacy would you like this sent to? [facility] pharmacy   Patient notified that their request is being sent to the clinical staff for review and that they should receive a response within 2 business days.

## 2023-11-19 NOTE — Telephone Encounter (Signed)
 Left message advising refill sent in as requested and to call back with any further questions or concerns.

## 2023-11-20 ENCOUNTER — Other Ambulatory Visit: Payer: Self-pay | Admitting: *Deleted

## 2023-11-20 NOTE — Patient Outreach (Signed)
 Transition of Care week 5  Visit Note  11/20/2023  Name: Melissa Zavala MRN: [id number]          DOB: 1940/11/29  Situation: Patient enrolled in [facility] 30-day program. Visit completed with patient by telephone.   Background:    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   Osteoporosis    Vitamin B12 deficiency 12/06/2017    Assessment: Patient Reported Symptoms: Cognitive Cognitive Status: No symptoms reported, Able to follow simple commands, Alert and oriented to person, place, and time      Neurological Neurological Review of Symptoms: No symptoms reported    HEENT HEENT Symptoms Reported: No symptoms reported      Cardiovascular Cardiovascular Symptoms Reported: No symptoms reported Cardiovascular Comment: Pt took her last Eliquis  yesterday, states refill is to be called in but she has not checked on,  RN CM contacted [employer], prescription is ready  Respiratory Respiratory Symptoms Reported: No symptoms reported    Endocrine Endocrine Symptoms Reported: No symptoms reported    Gastrointestinal Gastrointestinal Symptoms Reported: No symptoms reported      Genitourinary Genitourinary Symptoms Reported: No symptoms reported    Integumentary Integumentary Symptoms Reported: No symptoms reported    Musculoskeletal Musculoskelatal Symptoms Reviewed: Unsteady gait Other Musculoskeletal Symptoms: uses cane as needed,  continues working with home health PT Musculoskeletal Self-Management Outcome: 4 (good) Musculoskeletal Comment: reviewed safety precautions      Psychosocial Psychosocial Symptoms Reported: No symptoms reported         There were no vitals filed for this visit.  Medications Reviewed Today     Reviewed by Aura Mliss LABOR, RN (Registered Nurse) on 11/20/23 at 1441  Med List Status: <None>   Medication Order Taking? Sig Documenting Provider Last Dose Status Informant  acetaminophen   (TYLENOL ) 500 MG tablet [id number]  Take 1,000 mg by mouth every 8 (eight) hours as needed for headache or moderate pain. Pt takes 500 mg 2 tablets as needed. [provider]  Active   alendronate  (FOSAMAX ) 70 MG tablet [id number]  TAKE ONE TABLET  EVERY 7 DAYS. TAKE WITH A FULL GLASS OF WATER, ON AN EMPTY STOMACH. Joesph Annabella HERO, FNP  Active   amLODipine  (NORVASC ) 10 MG tablet [id number]  TAKE 1 TABLET BY MOUTH EVERY DAY Joesph Annabella HERO, FNP  Active   apixaban  (ELIQUIS ) 5 MG TABS tablet [id number]  Take 1 tablet (5 mg total) by mouth 2 (two) times daily. Joesph Annabella HERO, FNP  Active   aspirin  EC 81 MG tablet [id number]  Take 81 mg by mouth daily. Swallow whole. [provider]  Active   esomeprazole  (NEXIUM ) 40 MG capsule [id number]  TAKE 1 CAPSULE BY MOUTH DAILY Joesph Annabella HERO, FNP  Active   Evolocumab  (REPATHA  SURECLICK) 140 MG/ML SOAJ [id number]  Inject 140 mg into the skin every 14 (fourteen) days. Joesph Annabella HERO, FNP  Active   fluticasone  (FLONASE ) 50 MCG/ACT nasal spray [id number]  Place 2 sprays into both nostrils daily. Joesph Annabella HERO, FNP  Active   furosemide  (LASIX ) 20 MG tablet [id number]  Take 2 tablets (40 mg total) by mouth daily. Joesph Annabella HERO, FNP  Active   HYDROcodone -acetaminophen  (NORCO) 5-325 MG tablet [id number]  Take 1 tablet by mouth every 8 (eight) hours as needed for moderate pain (pain score 4-6). Joesph Annabella HERO, FNP  Active   isosorbide  mononitrate (IMDUR ) 30 MG 24 hr tablet [id number]  TAKE 1/2 TABLET  BY MOUTH EVERY DAY Dick, Ernest H Jr., NP  Active   levocetirizine (XYZAL ) 5 MG tablet [id number]  TAKE 1 TABLET BY MOUTH EVERY DAY Joesph Annabella HERO, FNP  Active   lisinopril  (ZESTRIL ) 40 MG tablet [id number]  TAKE 1 TABLET(40 MG) BY MOUTH DAILY Joesph Annabella HERO, FNP  Active   potassium chloride  (KLOR-CON ) 10 MEQ tablet [id number]  TAKE ONE TABLET TWICE A DAY. Joesph Annabella HERO, FNP  Active   VITAMIN D  PO [id number]  Take 1 capsule by mouth daily.  [provider]  Active             Recommendation:   PCP Follow-up Pickup Eliquis  today from pharmacy  Follow Up Plan:   Closing From:  Transitions of Care Program  Mliss Creed [facility], BSN RN Care Manager/ Transition of Care [place]/ [facility] [phone]

## 2023-11-20 NOTE — Patient Instructions (Signed)
 Visit Information  Thank you for taking time to visit with me today. Please don't hesitate to contact me if I can be of assistance to you before our next scheduled telephone appointment.   Following is a copy of your care plan:   Goals Addressed             This Visit's Progress    COMPLETED: VBCI Transitions of Care (TOC) Care Plan       Problems:  Recent Hospitalization for treatment of Acute DVT  10/30/2023    Patient lives alone, has good support from adult children, states no swelling in legs or feet but has soreness to bottom of feet when walking.  10/30/2023  reports no concerns for social support.  11/13/23- pt reports home health PT/ OT continue to work with her, she feels this is beneficial, no new concerns reported, has all medications and taking as prescribed 11/20/23- pt reports she is doing well, continues to work with home health, took last Eliquis  yesterday, requested refill, has had company today and has not had time to check on, RN CM will follow up today Goal:  Over the next 30 days, the patient will not experience hospital readmission  Interventions:  Pain assessment Reviewed follow up with PCP 12/10/23 @ 2 pm Encouraged patient to continue to take all her medications as prescribed. Call MD for any changes in condition.  Reinforced signs/ symptoms DVT, reportable signs/ symptoms Reviewed signs and symptoms of bleeding Reinforced fall prevention/ safety precautions Telephone call to [place] at [facility] who reports Eliquis  is ready for pickup, pt verbalizes understanding and will pickup today Reviewed plan of care including case closure  Patient Self Care Activities:  Attend all scheduled provider appointments Attend church or other social activities Call pharmacy for medication refills 3-7 days in advance of running out of medications Call provider office for new concerns or questions  Notify RN Care Manager of TOC call rescheduling needs Participate in  Transition of Care Program/Attend TOC scheduled calls Take medications as prescribed   Continue to participate in PT sessions Seek help if you fall Pickup Eliquis  today at [facility] pharmacy  Plan:  No further follow up required: case closure The patient has been provided with contact information for the care management team and has been advised to call with any health related questions or concerns.         Patient verbalizes understanding of instructions and care plan provided today and agrees to view in MyChart. Active MyChart status and patient understanding of how to access instructions and care plan via MyChart confirmed with patient.     No further follow up required: case closure  Please call the care guide team at [phone] if you need to cancel or reschedule your appointment.   Please call the Suicide and Crisis Lifeline: 988 call the [place]  National Suicide Prevention Lifeline: 8163290431 or TTY: 7570319780 TTY (418) 171-3447) to talk to a trained counselor call 1-800-273-TALK (toll free, 24 hour hotline) go to [facility] Urgent Care [address] [place] [phone]) call the [facility] Crisis Line: [phone] call 911 if you are experiencing a Mental Health or Behavioral Health Crisis or need someone to talk to.  Mliss Creed [facility], BSN RN Care Manager/ Transition of Care [place]/ [facility] [phone]

## 2023-11-23 DIAGNOSIS — Z7982 Long term (current) use of aspirin: Secondary | ICD-10-CM | POA: Diagnosis not present

## 2023-11-23 DIAGNOSIS — I10 Essential (primary) hypertension: Secondary | ICD-10-CM | POA: Diagnosis not present

## 2023-11-23 DIAGNOSIS — K219 Gastro-esophageal reflux disease without esophagitis: Secondary | ICD-10-CM | POA: Diagnosis not present

## 2023-11-23 DIAGNOSIS — Z7901 Long term (current) use of anticoagulants: Secondary | ICD-10-CM | POA: Diagnosis not present

## 2023-11-23 DIAGNOSIS — E876 Hypokalemia: Secondary | ICD-10-CM | POA: Diagnosis not present

## 2023-11-23 DIAGNOSIS — I82413 Acute embolism and thrombosis of femoral vein, bilateral: Secondary | ICD-10-CM | POA: Diagnosis not present

## 2023-11-27 DIAGNOSIS — I82413 Acute embolism and thrombosis of femoral vein, bilateral: Secondary | ICD-10-CM | POA: Diagnosis not present

## 2023-11-27 DIAGNOSIS — K219 Gastro-esophageal reflux disease without esophagitis: Secondary | ICD-10-CM | POA: Diagnosis not present

## 2023-11-27 DIAGNOSIS — E876 Hypokalemia: Secondary | ICD-10-CM | POA: Diagnosis not present

## 2023-11-27 DIAGNOSIS — I10 Essential (primary) hypertension: Secondary | ICD-10-CM | POA: Diagnosis not present

## 2023-11-27 DIAGNOSIS — Z7982 Long term (current) use of aspirin: Secondary | ICD-10-CM | POA: Diagnosis not present

## 2023-11-27 DIAGNOSIS — Z7901 Long term (current) use of anticoagulants: Secondary | ICD-10-CM | POA: Diagnosis not present

## 2023-12-07 DIAGNOSIS — I82413 Acute embolism and thrombosis of femoral vein, bilateral: Secondary | ICD-10-CM | POA: Diagnosis not present

## 2023-12-07 DIAGNOSIS — Z7982 Long term (current) use of aspirin: Secondary | ICD-10-CM | POA: Diagnosis not present

## 2023-12-07 DIAGNOSIS — K219 Gastro-esophageal reflux disease without esophagitis: Secondary | ICD-10-CM | POA: Diagnosis not present

## 2023-12-07 DIAGNOSIS — E876 Hypokalemia: Secondary | ICD-10-CM | POA: Diagnosis not present

## 2023-12-07 DIAGNOSIS — Z7901 Long term (current) use of anticoagulants: Secondary | ICD-10-CM | POA: Diagnosis not present

## 2023-12-07 DIAGNOSIS — I10 Essential (primary) hypertension: Secondary | ICD-10-CM | POA: Diagnosis not present

## 2023-12-10 ENCOUNTER — Encounter: Payer: Self-pay | Admitting: Family Medicine

## 2023-12-10 ENCOUNTER — Other Ambulatory Visit: Payer: Self-pay | Admitting: *Deleted

## 2023-12-10 ENCOUNTER — Ambulatory Visit (INDEPENDENT_AMBULATORY_CARE_PROVIDER_SITE_OTHER): Admitting: Family Medicine

## 2023-12-10 VITALS — BP 146/63 | HR 85 | Temp 98.4°F | Ht 60.0 in | Wt 191.6 lb

## 2023-12-10 DIAGNOSIS — M150 Primary generalized (osteo)arthritis: Secondary | ICD-10-CM

## 2023-12-10 DIAGNOSIS — J310 Chronic rhinitis: Secondary | ICD-10-CM

## 2023-12-10 DIAGNOSIS — Z79899 Other long term (current) drug therapy: Secondary | ICD-10-CM

## 2023-12-10 DIAGNOSIS — K219 Gastro-esophageal reflux disease without esophagitis: Secondary | ICD-10-CM | POA: Diagnosis not present

## 2023-12-10 DIAGNOSIS — I82413 Acute embolism and thrombosis of femoral vein, bilateral: Secondary | ICD-10-CM | POA: Diagnosis not present

## 2023-12-10 DIAGNOSIS — I10 Essential (primary) hypertension: Secondary | ICD-10-CM | POA: Diagnosis not present

## 2023-12-10 DIAGNOSIS — M51362 Other intervertebral disc degeneration, lumbar region with discogenic back pain and lower extremity pain: Secondary | ICD-10-CM

## 2023-12-10 DIAGNOSIS — Z7982 Long term (current) use of aspirin: Secondary | ICD-10-CM | POA: Diagnosis not present

## 2023-12-10 DIAGNOSIS — Z7901 Long term (current) use of anticoagulants: Secondary | ICD-10-CM | POA: Diagnosis not present

## 2023-12-10 DIAGNOSIS — E876 Hypokalemia: Secondary | ICD-10-CM | POA: Diagnosis not present

## 2023-12-10 MED ORDER — HYDROCODONE-ACETAMINOPHEN 5-325 MG PO TABS: 1.0000 | ORAL_TABLET | Freq: Three times a day (TID) | ORAL | 0 refills | Status: DC | PRN

## 2023-12-10 MED ORDER — LISINOPRIL 40 MG PO TABS: 40.0000 mg | ORAL_TABLET | Freq: Every day | ORAL | 3 refills | Status: DC

## 2023-12-10 MED ORDER — CETIRIZINE HCL 10 MG PO TABS: 10.0000 mg | ORAL_TABLET | Freq: Every day | ORAL | 11 refills | Status: AC

## 2023-12-10 MED ORDER — AMLODIPINE BESYLATE 10 MG PO TABS: 10.0000 mg | ORAL_TABLET | Freq: Every day | ORAL | 3 refills | Status: DC

## 2023-12-10 NOTE — Progress Notes (Signed)
 Established Patient Office Visit  Subjective   Patient ID: Melissa Zavala, female    DOB: 1940-08-06  Age: 83 y.o. MRN: [id number]  Chief Complaint  Patient presents with   Medical Management of Chronic Issues    HPI [name] is here for a chronic follow up today. [name]   HTN Complaint with meds - Yes Current Medications - amlodipine  10 mg, lasix  40 mg prn, lisinopril  40 mg,  Checking BP at home ranging 110-120/60-70s Pertinent ROS:  Headache - No Fatigue - No Visual Disturbances - No Chest pain - No Dyspnea - No Palpitations - No LE edema - mild, baseline  She has had increased salt lately- eating tomato sandwiches.   2. HLD On repatha .   3. GERD Reports well controlled with nexium .   4. Pain assessment: She has had to use her pain medication sparingly.  Has had increased pain because of this. Pain is in back and radiating down both legs. She has completed PT again with some relief.   Cause of pain- arthritis, DDD Pain location- legs & back Pain on scale of 1-10- 7-8/10 without medication; 5/10 with medication Frequency- comes and goes What increases pain- prolonged standing What makes pain better- rest and medication Effects on ADL- difficulty completing yard and house work, difficulty with balance and ambulation. Any change in general medical condition- none   Current opioids rx- Norco 5/325 mg Q8H PRN # meds rx- 90 Effectiveness of current meds- effective Adverse reactions form pain meds- none Morphine equivalent- 15 MME/day   Pill count performed-No Last drug screen - today ( high risk q25m, moderate risk q34m, low risk yearly ) Urine drug screen today- No Was the NCCSR reviewed- Yes             If yes were their any concerning findings? - No   Overdose risk: 160  Pain contract signed on: 03/21/23  5. Runny nose Each morning. She has been taking zyrtec  with improving. Requesting refill on this.   ROS Negative unless specially indicated above in HPI.    Objective:     BP (!) 146/63   Pulse 85   Temp 98.4 F (36.9 C) (Temporal)   Ht 5' (1.524 m)   Wt 191 lb 9.6 oz (86.9 kg)   SpO2 97%   BMI 37.42 kg/m  BP Readings from Last 3 Encounters:  12/10/23 (!) 146/63  10/29/23 118/62  10/16/23 (!) 138/55   Wt Readings from Last 3 Encounters:  12/10/23 191 lb 9.6 oz (86.9 kg)  10/29/23 193 lb 6.4 oz (87.7 kg)  10/16/23 194 lb (88 kg)     Physical Exam Vitals and nursing note reviewed.  Constitutional:      General: She is not in acute distress.    Appearance: She is not ill-appearing, toxic-appearing or diaphoretic.  Neck:     Thyroid : No thyroid  mass, thyromegaly or thyroid  tenderness.  Cardiovascular:     Rate and Rhythm: Normal rate and regular rhythm.     Heart sounds: Normal heart sounds. No murmur heard. Pulmonary:     Effort: Pulmonary effort is normal. No respiratory distress.     Breath sounds: Normal breath sounds.  Abdominal:     General: Bowel sounds are normal. There is no distension.     Palpations: Abdomen is soft.     Tenderness: There is no abdominal tenderness. There is no guarding or rebound.  Musculoskeletal:     Right lower leg: No edema.  Left lower leg: No edema.  Skin:    General: Skin is warm and dry.  Neurological:     General: No focal deficit present.     Mental Status: She is alert and oriented to person, place, and time.  Psychiatric:        Mood and Affect: Mood normal.        Behavior: Behavior normal.     No results found for any visits on 12/10/23.    The ASCVD Risk score (Arnett DK, et al., 2019) failed to calculate for the following reasons:   The 2019 ASCVD risk score is only valid for ages 7 to 64    Assessment & Plan:   Josiah was seen today for medical management of chronic issues.  Diagnoses and all orders for this visit:  Primary hypertension A little elevated today. Decrease salt in diet. Monitor at home and notify for elevated readings.  -     lisinopril   (ZESTRIL ) 40 MG tablet; Take 1 tablet (40 mg total) by mouth daily. -     amLODipine  (NORVASC ) 10 MG tablet; Take 1 tablet (10 mg total) by mouth daily.  Morbid obesity (HCC) Weight trending down. Diet, exercise, weight loss.   Degeneration of intervertebral disc of lumbar region with discogenic back pain and lower extremity pain Primary osteoarthritis involving multiple joints Controlled substance agreement signed Well controlled on current regimen. PDMP reviewed, no red flags. CSA and UDS are UTD.  -     HYDROcodone -acetaminophen  (NORCO) 5-325 MG tablet; Take 1 tablet by mouth every 8 (eight) hours as needed for moderate pain (pain score 4-6). -     HYDROcodone -acetaminophen  (NORCO) 5-325 MG tablet; Take 1 tablet by mouth every 8 (eight) hours as needed for moderate pain (pain score 4-6). -     HYDROcodone -acetaminophen  (NORCO) 5-325 MG tablet; Take 1 tablet by mouth every 8 (eight) hours as needed for moderate pain (pain score 4-6).  Chronic rhinitis Well controlled on current regimen.  -     cetirizine  (ZYRTEC ) 10 MG tablet; Take 1 tablet (10 mg total) by mouth daily.    Return in about 3 months (around 03/11/2024) for chronic follow up.   The patient indicates understanding of these issues and agrees with the plan.  Annabella CHRISTELLA Search, FNP

## 2024-01-03 ENCOUNTER — Encounter: Payer: Self-pay | Admitting: Nurse Practitioner

## 2024-01-03 ENCOUNTER — Ambulatory Visit: Attending: Nurse Practitioner | Admitting: Nurse Practitioner

## 2024-01-03 VITALS — BP 141/59 | HR 62 | Ht 63.0 in | Wt 194.6 lb

## 2024-01-03 DIAGNOSIS — I82412 Acute embolism and thrombosis of left femoral vein: Secondary | ICD-10-CM | POA: Diagnosis not present

## 2024-01-03 DIAGNOSIS — R609 Edema, unspecified: Secondary | ICD-10-CM | POA: Diagnosis not present

## 2024-01-03 DIAGNOSIS — E785 Hyperlipidemia, unspecified: Secondary | ICD-10-CM

## 2024-01-03 DIAGNOSIS — R011 Cardiac murmur, unspecified: Secondary | ICD-10-CM

## 2024-01-03 DIAGNOSIS — I10 Essential (primary) hypertension: Secondary | ICD-10-CM

## 2024-01-03 DIAGNOSIS — I2510 Atherosclerotic heart disease of native coronary artery without angina pectoris: Secondary | ICD-10-CM

## 2024-01-03 DIAGNOSIS — E669 Obesity, unspecified: Secondary | ICD-10-CM

## 2024-01-03 MED ORDER — ISOSORBIDE MONONITRATE ER 30 MG PO TB24: 15.0000 mg | ORAL_TABLET | Freq: Every day | ORAL | 3 refills | Status: AC

## 2024-01-03 MED ORDER — POTASSIUM CHLORIDE ER 10 MEQ PO TBCR: 10.0000 meq | EXTENDED_RELEASE_TABLET | Freq: Two times a day (BID) | ORAL | 3 refills | Status: AC

## 2024-01-03 MED ORDER — AMLODIPINE BESYLATE 10 MG PO TABS: 10.0000 mg | ORAL_TABLET | Freq: Every day | ORAL | 3 refills | Status: DC

## 2024-01-03 MED ORDER — ISOSORBIDE MONONITRATE ER 30 MG PO TB24: 15.0000 mg | ORAL_TABLET | Freq: Every day | ORAL | 3 refills | Status: DC

## 2024-01-03 MED ORDER — FUROSEMIDE 20 MG PO TABS: 40.0000 mg | ORAL_TABLET | Freq: Every day | ORAL | 3 refills | Status: AC

## 2024-01-03 MED ORDER — LISINOPRIL 40 MG PO TABS: 40.0000 mg | ORAL_TABLET | Freq: Every day | ORAL | 3 refills | Status: AC

## 2024-01-03 MED ORDER — APIXABAN 5 MG PO TABS: 5.0000 mg | ORAL_TABLET | Freq: Two times a day (BID) | ORAL | 7 refills | Status: DC

## 2024-01-03 NOTE — Patient Instructions (Addendum)

## 2024-01-03 NOTE — Progress Notes (Addendum)
 Cardiology Office Note   Date:  01/03/2024 ID:  Melissa Zavala, Melissa Zavala 05/05/1941, MRN [id number] PCP: Joesph Annabella HERO, FNP  [facility] Providers Cardiologist:  Alvan Carrier, MD     History of Present Illness Melissa Zavala is a 83 y.o. female with a PMH of mild CAD, hx of acute DVT (10/2023), past history of chest pain, hypertension, hyperlipidemia, PVCs, who presents today for overdue follow-up.  Last seen by Dr. Alvan on December 26, 2022.  Was overall doing well at that time.  Hospitalized 10/2023 d/t acute DVT of left femoral vein.   Today she presents for overdue follow-up.  She states she is doing well. Denies any acute cardiac complaints or issues. Denies any chest pain, shortness of breath, palpitations, syncope, presyncope, dizziness, orthopnea, PND, swelling or significant weight changes, acute bleeding, or claudication. She is out of Imdur  and needs this medication refilled.   ROS: Negative. See HPI.   Studies Reviewed  EKG: EKG is not ordered today.   Echo 06/2023 at [facility]:  EF 60-65%, normal RV size and function. Mild TR, moderate posterior mitral annular calcification, no other significant valvular abnormalities.   Lexiscan  03/2022:    Findings are consistent with a very small areaof  ischemia in the mid anterior wall. The study is low risk.   No ST deviation was noted.   LV perfusion is abnormal. Defect 1: There is a small defect with mild reduction in uptake present in the mid anterior location(s) that is reversible. There is normal wall motion in the defect area. Consistent with ischemia.   Left ventricular function is normal. Nuclear stress EF: 69 %. The left ventricular ejection fraction is hyperdynamic (>65%). End diastolic cavity size is normal.   Prior study not available for comparison.  Echo 12/2021:   1. Left ventricular ejection fraction, by estimation, is 60 to 65%. The  left ventricle has normal function. The left  ventricle has no regional  wall motion abnormalities. There is moderate asymmetric left ventricular  hypertrophy of the basal-septal  segment. Left ventricular diastolic parameters are consistent with Grade  II diastolic dysfunction (pseudonormalization). Elevated left atrial  pressure.   2. Right ventricular systolic function is normal. The right ventricular  size is normal. There is moderately elevated pulmonary artery systolic  pressure. The estimated right ventricular systolic pressure is 54.9 mmHg.   3. Left atrial size was mildly dilated.   4. Right atrial size was mildly dilated.   5. The mitral valve is degenerative. Trivial mitral valve regurgitation.  No evidence of mitral stenosis.   6. The aortic valve was not well visualized. Aortic valve regurgitation  is not visualized. No aortic stenosis is present.   7. The inferior vena cava is dilated in size with <50% respiratory  variability, suggesting right atrial pressure of 15 mmHg.  Physical Exam VS:  BP (!) 141/59   Pulse 62   Ht 5' 3 (1.6 m)   Wt 194 lb 9.6 oz (88.3 kg)   SpO2 98%   BMI 34.47 kg/m        Wt Readings from Last 3 Encounters:  01/03/24 194 lb 9.6 oz (88.3 kg)  12/10/23 191 lb 9.6 oz (86.9 kg)  10/29/23 193 lb 6.4 oz (87.7 kg)    GEN: Well nourished, well developed in no acute distress NECK: No JVD; No carotid bruits, pulsating bilateral neck veins CARDIAC: S1/S2, RRR, Grade 2/6 murmur, no rubs, no gallops RESPIRATORY:  Clear to auscultation without  rales, wheezing or rhonchi  ABDOMEN: Soft, non-tender, non-distended EXTREMITIES:  No edema; No deformity   ASSESSMENT AND PLAN  Mild CAD Stable with no anginal symptoms. No indication for ischemic evaluation. Will confirm with pt and if taking aspirin  on top of Eliquis  - will stop. Denies any bleeding issues. Continue Eliquis  as outlined below. No other medication changes at this time. Heart healthy diet and regular cardiovascular exercise encouraged.    HTN BP borderline elevated on recheck today. Discussed to monitor BP at home at least 2 hours after medications and sitting for 5-10 minutes. No medication changes at this time and I imagine BP will improve after restarting Imdur . Will refill her medications today. Discussed SBP goal < 140. If no improvement by next office visit, plan to adjust medications. Heart healthy diet and regular cardiovascular exercise encouraged. Discussed when to notify our office.   HLD Most recent LDL 03/2023 was 90. She is at goal. Continue Repatha . Heart healthy diet and regular cardiovascular exercise encouraged.   4. Acute DVT Denies any issues. Continue Eliquis  for treatment. Being managed by PCP.   5. Murmur, pulsating neck veins Grade 2/6 murmur noted on exam today and pulsating neck veins, no signs of JVD. No symptoms noted by pt. Will update Echo at this time.   5. Obesity   Weight loss via diet and exercise encouraged. Discussed the impact being overweight would have on cardiovascular risk.  I spent a total duration of 30 minutes reviewing prior notes, reviewing outside records including  labs, face-to-face counseling of medical condition, pathophysiology, evaluation, management, and documenting the findings in the note.    Dispo: Will provide refills per her request. Follow-up with MD/APP in 6 months or sooner if anything changes.   Signed, Almarie Crate, NP

## 2024-01-07 ENCOUNTER — Telehealth: Payer: Self-pay | Admitting: Nurse Practitioner

## 2024-01-07 NOTE — Telephone Encounter (Signed)
 Patient informed and verbalized understanding of plan. Patient will stop taking ASA

## 2024-01-07 NOTE — Telephone Encounter (Signed)
-----   Message from Almarie Crate sent at 01/07/2024 11:37 AM EDT ----- Please check to see if patient is taking Aspirin  in addition to her Eliquis . If so, please instruct her to stop ASA as she is on Eliquis .   Thanks!   Best, Almarie Crate, NP

## 2024-01-28 ENCOUNTER — Ambulatory Visit: Attending: Nurse Practitioner

## 2024-01-28 DIAGNOSIS — R011 Cardiac murmur, unspecified: Secondary | ICD-10-CM | POA: Insufficient documentation

## 2024-01-28 LAB — ECHOCARDIOGRAM COMPLETE
AR max vel: 2.33 cm2
AV Peak grad: 10.5 mmHg
Ao pk vel: 1.62 m/s
Area-P 1/2: 2.21 cm2
Calc EF: 70.5 %
S' Lateral: 3.4 cm
Single Plane A2C EF: 65.2 %
Single Plane A4C EF: 71.7 %

## 2024-02-05 ENCOUNTER — Ambulatory Visit: Payer: Self-pay | Admitting: Nurse Practitioner

## 2024-02-19 ENCOUNTER — Ambulatory Visit

## 2024-03-11 ENCOUNTER — Other Ambulatory Visit: Payer: Self-pay

## 2024-03-11 ENCOUNTER — Telehealth: Payer: Self-pay | Admitting: Family Medicine

## 2024-03-11 DIAGNOSIS — I10 Essential (primary) hypertension: Secondary | ICD-10-CM

## 2024-03-11 DIAGNOSIS — I82412 Acute embolism and thrombosis of left femoral vein: Secondary | ICD-10-CM

## 2024-03-11 MED ORDER — AMLODIPINE BESYLATE 10 MG PO TABS: 10.0000 mg | ORAL_TABLET | Freq: Every day | ORAL | 0 refills | Status: DC

## 2024-03-11 MED ORDER — APIXABAN 5 MG PO TABS: 5.0000 mg | ORAL_TABLET | Freq: Two times a day (BID) | ORAL | 0 refills | Status: DC

## 2024-03-11 NOTE — Telephone Encounter (Signed)
Completed  -LS

## 2024-03-11 NOTE — Telephone Encounter (Signed)
 Copied from CRM [id number]. Topic: Clinical - Medication Question >> Mar 11, 2024  9:03 AM Melissa Zavala wrote: Reason for CRM: Pt called in needing to inform clinic that she accidentally lost her medication. Attempted to contact pt to see exactly which medication it was but there was no answer.

## 2024-03-11 NOTE — Progress Notes (Signed)
 30 day supply ok'd by covering provider since pt lost her meds.

## 2024-03-12 ENCOUNTER — Encounter: Payer: Self-pay | Admitting: Family Medicine

## 2024-03-12 ENCOUNTER — Ambulatory Visit: Admitting: Family Medicine

## 2024-03-12 VITALS — BP 124/62 | HR 79 | Temp 98.5°F | Ht 63.0 in | Wt 192.6 lb

## 2024-03-12 DIAGNOSIS — Z23 Encounter for immunization: Secondary | ICD-10-CM | POA: Diagnosis not present

## 2024-03-12 DIAGNOSIS — G720 Drug-induced myopathy: Secondary | ICD-10-CM | POA: Diagnosis not present

## 2024-03-12 DIAGNOSIS — M150 Primary generalized (osteo)arthritis: Secondary | ICD-10-CM

## 2024-03-12 DIAGNOSIS — M810 Age-related osteoporosis without current pathological fracture: Secondary | ICD-10-CM

## 2024-03-12 DIAGNOSIS — I10 Essential (primary) hypertension: Secondary | ICD-10-CM | POA: Diagnosis not present

## 2024-03-12 DIAGNOSIS — I82412 Acute embolism and thrombosis of left femoral vein: Secondary | ICD-10-CM

## 2024-03-12 DIAGNOSIS — Z79899 Other long term (current) drug therapy: Secondary | ICD-10-CM | POA: Diagnosis not present

## 2024-03-12 DIAGNOSIS — E782 Mixed hyperlipidemia: Secondary | ICD-10-CM

## 2024-03-12 DIAGNOSIS — I2510 Atherosclerotic heart disease of native coronary artery without angina pectoris: Secondary | ICD-10-CM | POA: Diagnosis not present

## 2024-03-12 DIAGNOSIS — M51362 Other intervertebral disc degeneration, lumbar region with discogenic back pain and lower extremity pain: Secondary | ICD-10-CM | POA: Diagnosis not present

## 2024-03-12 MED ORDER — REPATHA SURECLICK 140 MG/ML ~~LOC~~ SOAJ: 140.0000 mg | SUBCUTANEOUS | 11 refills | Status: AC

## 2024-03-12 MED ORDER — HYDROCODONE-ACETAMINOPHEN 5-325 MG PO TABS: 1.0000 | ORAL_TABLET | Freq: Three times a day (TID) | ORAL | 0 refills | Status: AC | PRN

## 2024-03-12 MED ORDER — ALENDRONATE SODIUM 70 MG PO TABS: 70.0000 mg | ORAL_TABLET | ORAL | 3 refills | Status: AC

## 2024-03-12 MED ORDER — APIXABAN 5 MG PO TABS: 5.0000 mg | ORAL_TABLET | Freq: Two times a day (BID) | ORAL | 3 refills | Status: AC

## 2024-03-12 MED ORDER — AMLODIPINE BESYLATE 10 MG PO TABS: 10.0000 mg | ORAL_TABLET | Freq: Every day | ORAL | 3 refills | Status: AC

## 2024-03-12 NOTE — Progress Notes (Unsigned)
 Established Patient Office Visit  Subjective   Patient ID: Melissa Zavala, female    DOB: 1941/02/16  Age: 83 y.o. MRN: [id number]  Chief Complaint  Patient presents with   Medical Management of Chronic Issues    HPI Melissa Zavala is here for a chronic follow up today. She has no new concerns today.   HTN Complaint with meds - Yes Current Medications - amlodipine  10 mg, lasix  40 mg prn, lisinopril  40 mg,  Checking BP at home ranging 110-120/60-70s Pertinent ROS:  Headache - No Fatigue - No Visual Disturbances - No Chest pain - No Dyspnea - No Palpitations - No LE edema - mild, baseline  2. HLD Continues on Repatha . Needs refill today. Hx of statin myopathy.   3. GERD Reports well controlled with medication.   4. Pain assessment: She lost her medication on a road trip over the weekend. She had her daily medications in a bag and left the bag behind. She picked up her prescription on 02/27/24.   Cause of pain- arthritis, DDD Pain location- legs & back Pain on scale of 1-10- 8-9/10 without medication; 5/10 with medication Frequency- comes and goes What increases pain- prolonged standing What makes pain better- rest and medication Effects on ADL- difficulty completing yard and house work, difficulty with balance and ambulation. Any change in general medical condition- none   Current opioids rx- Norco 5/325 mg Q8H PRN # meds rx- 90 Effectiveness of current meds- effective Adverse reactions form pain meds- none Morphine equivalent- 15 MME/day   Pill count performed-No Last drug screen - today ( high risk q46m, moderate risk q58m, low risk yearly ) Urine drug screen today- No Was the NCCSR reviewed- Yes             If yes were their any concerning findings? - No   Overdose risk: 160  Pain contract signed on: 03/21/23   ROS Negative unless specially indicated above in HPI.   Objective:     BP 124/62   Pulse 79   Temp 98.5 F (36.9 C) (Temporal)   Ht 5' 3 (1.6 m)    Wt 192 lb 9.6 oz (87.4 kg)   SpO2 97%   BMI 34.12 kg/m  BP Readings from Last 3 Encounters:  03/12/24 124/62  01/03/24 (!) 141/59  12/10/23 (!) 146/63   Wt Readings from Last 3 Encounters:  03/12/24 192 lb 9.6 oz (87.4 kg)  01/03/24 194 lb 9.6 oz (88.3 kg)  12/10/23 191 lb 9.6 oz (86.9 kg)     Physical Exam Vitals and nursing note reviewed.  Constitutional:      General: She is not in acute distress.    Appearance: She is not ill-appearing, toxic-appearing or diaphoretic.  Neck:     Thyroid : No thyroid  mass, thyromegaly or thyroid  tenderness.  Cardiovascular:     Rate and Rhythm: Normal rate and regular rhythm.     Heart sounds: Normal heart sounds. No murmur heard. Pulmonary:     Effort: Pulmonary effort is normal. No respiratory distress.     Breath sounds: Normal breath sounds.  Abdominal:     General: Bowel sounds are normal. There is no distension.     Palpations: Abdomen is soft.     Tenderness: There is no abdominal tenderness. There is no guarding or rebound.  Musculoskeletal:     Right lower leg: No edema.     Left lower leg: No edema.  Skin:    General: Skin is warm and dry.  Neurological:     General: No focal deficit present.     Mental Status: She is alert and oriented to person, place, and time.  Psychiatric:        Mood and Affect: Mood normal.        Behavior: Behavior normal.     No results found for any visits on 03/12/24.    The ASCVD Risk score (Arnett DK, et al., 2019) failed to calculate for the following reasons:   The 2019 ASCVD risk score is only valid for ages 12 to 47    Assessment & Plan:   Melissa Zavala was seen today for medical management of chronic issues.  Diagnoses and all orders for this visit:  Primary hypertension Well controlled on current regimen.  -     amLODipine  (NORVASC ) 10 MG tablet; Take 1 tablet (10 mg total) by mouth daily.  Morbid obesity (HCC) She has lost 2 lbs. Diet, exercise as tolerated.   Coronary artery  disease involving native coronary artery of native heart without angina pectoris Continue repatha . Continue follow up with cardiology.  -     Evolocumab  (REPATHA  SURECLICK) 140 MG/ML SOAJ; Inject 140 mg into the skin every 14 (fourteen) days.  Mixed hyperlipidemia -     Evolocumab  (REPATHA  SURECLICK) 140 MG/ML SOAJ; Inject 140 mg into the skin every 14 (fourteen) days. -     Lipid panel; Future  Statin myopathy -     Evolocumab  (REPATHA  SURECLICK) 140 MG/ML SOAJ; Inject 140 mg into the skin every 14 (fourteen) days. 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 She is not having withdrawal symptoms. Refills provided with next pickup for 02/27/24. PDMP reviewed, no red flags. Drug screen and CSA updated as below.  -     HYDROcodone -acetaminophen  (NORCO) 5-325 MG tablet; Take 1 tablet by mouth every 8 (eight) hours as needed for moderate pain (pain score 4-6). -     HYDROcodone -acetaminophen  (NORCO) 5-325 MG tablet; Take 1 tablet by mouth every 8 (eight) hours as needed for moderate pain (pain score 4-6). -     HYDROcodone -acetaminophen  (NORCO) 5-325 MG tablet; Take 1 tablet by mouth every 8 (eight) hours as needed for moderate pain (pain score 4-6). -     ToxASSURE Select 13 (MW), Urine -     Drug Screen 10 W/Conf, Serum; Future  Acute deep vein thrombosis (DVT) of femoral vein of left lower extremity (HCC) Continue eliquis .  -     apixaban  (ELIQUIS ) 5 MG TABS tablet; Take 1 tablet (5 mg total) by mouth 2 (two) times daily.  Age-related osteoporosis without current pathological fracture -     alendronate  (FOSAMAX ) 70 MG tablet; Take 1 tablet (70 mg total) by mouth once a week. Take with a full glass of water on an empty stomach.  Encounter for  immunization -     Flu vaccine HIGH DOSE PF(Fluzone Trivalent)  Return in about 3 months (around 06/12/2024) for chronic follow up.   The patient indicates understanding of these issues and agrees with the plan.  Melissa Zavala Search, FNP

## 2024-03-13 ENCOUNTER — Other Ambulatory Visit

## 2024-03-13 DIAGNOSIS — T466X5A Adverse effect of antihyperlipidemic and antiarteriosclerotic drugs, initial encounter: Secondary | ICD-10-CM

## 2024-03-13 DIAGNOSIS — E782 Mixed hyperlipidemia: Secondary | ICD-10-CM

## 2024-03-13 DIAGNOSIS — Z79899 Other long term (current) drug therapy: Secondary | ICD-10-CM

## 2024-03-18 ENCOUNTER — Telehealth: Payer: Self-pay

## 2024-03-18 NOTE — Telephone Encounter (Unsigned)
 Copied from CRM [id number]. Topic: Clinical - Lab/Test Results >> Mar 18, 2024 11:02 AM Melissa Zavala wrote: Reason for CRM: Pt wants to discuss her lab results, please advise   Best contact: [phone]

## 2024-03-19 ENCOUNTER — Ambulatory Visit: Payer: Self-pay

## 2024-03-19 NOTE — Telephone Encounter (Signed)
 Called [facility] to advise them of patient's severe pain and refusal of the ER at this time

## 2024-03-19 NOTE — Telephone Encounter (Signed)
 Patient has had severe lower back and left side pain Lower left side of lower back/left side x 3 days  This RN read patient this message about her lab results: Joesph Annabella HERO, FNP    03/19/24  8:00 AM Note LDL was at 94. Not at goal with her cardiac history. Will recheck this after she restarts on repatha . Drug screen is still in process.   Patient wanted to know what her goal was with her LDL.  Patient also refused to go to the ER today and states if it is worse she will go tomorrow   FYI Only or Action Required?: Action required by provider: clinical question for provider, update on patient condition, and ER Refusal at this time and patient wanted to know what her goal LDL was.  Patient was last seen in primary care on 03/12/2024 by Joesph Annabella HERO, FNP.  Called Nurse Triage reporting Back Pain.  Symptoms began several days ago.  Interventions attempted: Rest, hydration, or home remedies.  Symptoms are: unchanged.  Triage Disposition: Go to ED Now (Notify PCP)  Patient/caregiver understands and will follow disposition?: No, wishes to speak with PCP               Copied from CRM [id number]. Topic: Clinical - Red Word Triage >> Mar 19, 2024  1:31 PM Roselie BROCKS wrote: Red Word that prompted transfer to Nurse Triage: Patient originally called for lab results also states she is having bad back pain as well Reason for Disposition  [1] SEVERE pain (e.g., excruciating, scale 8-10) AND [2] present > 1 hour  Answer Assessment - Initial Assessment Questions Frequent urination today Patient states left flank pain and lower back pain that started about three days ago She states the pain is severe at a level of 10 out of 10  Patient has had severe back and side pain Lower left side of lower back/left side x 3 days  This RN read patient this message about her lab results: Joesph Annabella HERO, FNP    03/19/24  8:00 AM Note LDL was at 94. Not at goal with her cardiac  history. Will recheck this after she restarts on repatha . Drug screen is still in process.   Patient wanted to know what her goal was with her LDL.  She is advised that with severe pain it is recommended that she goes to the Emergency Room Patient states that if it is worse tomorrow she will go to the Emergency Room This RN advised her that it was recommended to be seen today and I will let her PCP know Patient was advised to call back with any further questions or concerns and she verbalized understanding.    1. LOCATION: Where does it hurt? (e.g., left, right)     left 2. ONSET: When did the pain start?     3 days ago 3. SEVERITY: How bad is the pain? (e.g., Scale 1-10; mild, moderate, or severe)     10 4. PATTERN: Does the pain come and go, or is it constant?      constant 5. CAUSE: What do you think is causing the pain?     unsure 6. OTHER SYMPTOMS:  Do you have any other symptoms? (e.g., fever, abdomen pain, vomiting, leg weakness, burning with urination, blood in urine)     denies  Protocols used: Flank Pain-A-AH

## 2024-03-19 NOTE — Telephone Encounter (Signed)
 Contacted patient. She describes left lower back/side pain with urinary frequency.  She asked for an appt.  I scheduled her with PCP 03/20/2024

## 2024-03-19 NOTE — Telephone Encounter (Signed)
Left vm for pt to call back for results.

## 2024-03-19 NOTE — Telephone Encounter (Signed)
 LDL was at 94. Not at goal with her cardiac history. Will recheck this after she restarts on repatha . Drug screen is still in process.

## 2024-03-19 NOTE — Telephone Encounter (Signed)
 Spoke with patient.  She says E2C2 nurse gave her lab results.

## 2024-03-20 ENCOUNTER — Ambulatory Visit (INDEPENDENT_AMBULATORY_CARE_PROVIDER_SITE_OTHER): Admitting: Family Medicine

## 2024-03-20 ENCOUNTER — Ambulatory Visit: Payer: Self-pay | Admitting: Family Medicine

## 2024-03-20 ENCOUNTER — Ambulatory Visit (HOSPITAL_COMMUNITY)
Admission: RE | Admit: 2024-03-20 | Discharge: 2024-03-20 | Disposition: A | Discharge status: Home / Self Care | Source: Ambulatory Visit | Attending: Family Medicine | Admitting: Family Medicine

## 2024-03-20 ENCOUNTER — Encounter: Payer: Self-pay | Admitting: Family Medicine

## 2024-03-20 VITALS — BP 148/69 | HR 82 | Temp 97.8°F | Ht 63.0 in | Wt 192.6 lb

## 2024-03-20 DIAGNOSIS — R350 Frequency of micturition: Secondary | ICD-10-CM | POA: Insufficient documentation

## 2024-03-20 DIAGNOSIS — R110 Nausea: Secondary | ICD-10-CM | POA: Diagnosis present

## 2024-03-20 DIAGNOSIS — R3915 Urgency of urination: Secondary | ICD-10-CM

## 2024-03-20 DIAGNOSIS — R108A2 Left flank tenderness: Secondary | ICD-10-CM

## 2024-03-20 DIAGNOSIS — R10A2 Flank pain, left side: Secondary | ICD-10-CM

## 2024-03-20 LAB — URINALYSIS, ROUTINE W REFLEX MICROSCOPIC
Bilirubin, UA: NEGATIVE
Glucose, UA: NEGATIVE
Ketones, UA: NEGATIVE
Nitrite, UA: NEGATIVE
Protein,UA: NEGATIVE
RBC, UA: NEGATIVE
Specific Gravity, UA: 1.015 (ref 1.005–1.030)
Urobilinogen, Ur: 0.2 mg/dL (ref 0.2–1.0)
pH, UA: 7.5 (ref 5.0–7.5)

## 2024-03-20 LAB — MICROSCOPIC EXAMINATION

## 2024-03-20 NOTE — Progress Notes (Signed)
 Acute Office Visit  Subjective:     Patient ID: Melissa Zavala, female    DOB: 11-04-1940, 83 y.o.   MRN: [id number]  Chief Complaint  Patient presents with   Urinary Frequency   Flank Pain    Urinary Frequency  Associated symptoms include flank pain and frequency.  Flank Pain    History of Present Illness   Melissa Zavala is an 83 year old female who presents with sharp back and side pain.  Back and flank pain - Sharp pain in the left flank and left side for the past three days - Pain is severe and distinct from usual arthritis pain - Pain is exacerbated by walking, movement - No recent history of kidney stones, but had them years ago  Urinary symptoms - Burning sensation during urination, mild in intensity - Increased urinary frequency and urgency - Odor present at onset of symptoms - No hematuria  Constitutional and gastrointestinal symptoms - No fever or chills - Nausea and sensation of wanting to vomit, without actual emesis - No lower abdominal pain or cramping  Anticoagulation therapy - Currently taking a blood thinner       Review of Systems  Genitourinary:  Positive for flank pain and frequency.   As per HPI.      Objective:    BP (!) 148/69   Pulse 82   Temp 97.8 F (36.6 C) (Temporal)   Ht 5' 3 (1.6 m)   Wt 192 lb 9.6 oz (87.4 kg)   SpO2 97%   BMI 34.12 kg/m    Physical Exam Vitals and nursing note reviewed.  Constitutional:      General: She is not in acute distress.    Appearance: She is obese. She is not ill-appearing, toxic-appearing or diaphoretic.  HENT:     Head: Normocephalic and atraumatic.  Pulmonary:     Effort: Pulmonary effort is normal. No respiratory distress.  Abdominal:     General: There is no distension.     Tenderness: There is no abdominal tenderness. There is left CVA tenderness. There is no right CVA tenderness, guarding or rebound.     Hernia: No hernia is present.  Skin:    General: Skin is warm and dry.   Neurological:     General: No focal deficit present.     Mental Status: She is alert and oriented to person, place, and time.  Psychiatric:        Mood and Affect: Mood normal.        Behavior: Behavior normal.     Urine dipstick shows positive for leukocytes.  Micro exam: 0-5 WBC's per HPF, 0-2 RBC's per HPF, and few + bacteria.     Assessment & Plan:   Melissa Zavala was seen today for urinary frequency and flank pain.  Diagnoses and all orders for this visit:  Left flank pain -     Urinalysis, Routine w reflex microscopic -     Urine Culture -     CT RENAL STONE STUDY; Future  Left flank tenderness -     Urinalysis, Routine w reflex microscopic -     Urine Culture -     CT RENAL STONE STUDY; Future  Urinary urgency -     Urinalysis, Routine w reflex microscopic -     Urine Culture -     CT RENAL STONE STUDY; Future  Urinary frequency -     Urinalysis, Routine w reflex microscopic -  Urine Culture -     CT RENAL STONE STUDY; Future  Nausea -     Urinalysis, Routine w reflex microscopic -     Urine Culture -     CT RENAL STONE STUDY; Future    Assessment and Plan    Left flank pain with CVA tenderness urinary frequency, urgency, and nausea Acute left flank pain with urinary symptoms. Differential includes pyelonephritis or nephrolithiasis. UA is not compelling for infection. Hematuria not present.  - Ordered stat CT renal stone - Sent urine for culture. - Will notify patient of results and plan of care pending report      Melissa Zavala Search, FNP

## 2024-03-21 ENCOUNTER — Other Ambulatory Visit: Payer: Self-pay | Admitting: Family Medicine

## 2024-03-21 DIAGNOSIS — Z1231 Encounter for screening mammogram for malignant neoplasm of breast: Secondary | ICD-10-CM

## 2024-03-21 LAB — LIPID PANEL
Chol/HDL Ratio: 2.4 ratio (ref 0.0–4.4)
Cholesterol, Total: 188 mg/dL (ref 100–199)
HDL: 80 mg/dL (ref >39)
LDL Chol Calc (NIH): 94 mg/dL (ref 0–99)
Triglycerides: 78 mg/dL (ref 0–149)
VLDL Cholesterol Cal: 14 mg/dL (ref 5–40)

## 2024-03-21 LAB — OPIATES,MS,WB/SP RFX
6-Acetylmorphine: NEGATIVE
Codeine: NEGATIVE ng/mL
Dihydrocodeine: NEGATIVE ng/mL
Hydrocodone: 13 ng/mL
Hydromorphone: NEGATIVE ng/mL
Morphine: NEGATIVE ng/mL
Opiate Confirmation: POSITIVE

## 2024-03-21 LAB — DRUG SCREEN 10 W/CONF, SERUM
Amphetamines, IA: NEGATIVE ng/mL
Barbiturates, IA: NEGATIVE ug/mL
Benzodiazepines, IA: NEGATIVE ng/mL
Cocaine & Metabolite, IA: NEGATIVE ng/mL
Methadone, IA: NEGATIVE ng/mL
Opiates, IA: POSITIVE ng/mL — AB
Phencyclidine, IA: NEGATIVE ng/mL
Propoxyphene, IA: NEGATIVE ng/mL
THC(Marijuana) Metabolite, IA: NEGATIVE ng/mL

## 2024-03-21 LAB — OXYCODONES,MS,WB/SP RFX
Oxycocone: NEGATIVE ng/mL
Oxycodones Confirmation: NEGATIVE
Oxymorphone: NEGATIVE ng/mL

## 2024-03-24 ENCOUNTER — Ambulatory Visit: Payer: Self-pay | Admitting: Family Medicine

## 2024-03-27 ENCOUNTER — Ambulatory Visit (INDEPENDENT_AMBULATORY_CARE_PROVIDER_SITE_OTHER): Payer: Self-pay

## 2024-03-27 VITALS — BP 148/69 | HR 82 | Ht 63.0 in | Wt 192.0 lb

## 2024-03-27 DIAGNOSIS — Z0000 Encounter for general adult medical examination without abnormal findings: Secondary | ICD-10-CM

## 2024-03-27 NOTE — Progress Notes (Signed)
 Chief Complaint  Patient presents with   Medicare Wellness     Subjective:   Melissa Zavala is a 83 y.o. female who presents for a Medicare Annual Wellness Visit.  Allergies (verified) Iodinated contrast media, Diphenhydramine, Codeine, Statins, and Zetia  [ezetimibe ]   History: 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   Osteoporosis    Vitamin B12 deficiency 12/06/2017   Past Surgical History:  Procedure Laterality Date   TOTAL HIP ARTHROPLASTY Right    TOTAL KNEE ARTHROPLASTY Bilateral    TUBAL LIGATION     Family History  Problem Relation Age of Onset   Breast cancer Mother    Social History   Occupational History   Occupation: Retired  Tobacco Use   Smoking status: Never   Smokeless tobacco: Never  Vaping Use   Vaping status: Never Used  Substance and Sexual Activity   Alcohol use: No   Drug use: No   Sexual activity: Not Currently   Tobacco Counseling Counseling given: Yes  SDOH Screenings   Food Insecurity: No Food Insecurity (03/27/2024)  Housing: Low Risk  (03/27/2024)  Transportation Needs: No Transportation Needs (03/27/2024)  Utilities: Not At Risk (03/27/2024)  Alcohol Screen: Low Risk  (01/03/2022)  Depression (PHQ2-9): Low Risk  (03/27/2024)  Financial Resource Strain: Medium Risk (07/09/2023)   Received from [facility]  Physical Activity: Insufficiently Active (03/27/2024)  Social Connections: Moderately Integrated (03/27/2024)  Stress: No Stress Concern Present (03/27/2024)  Tobacco Use: Low Risk  (03/27/2024)  Health Literacy: Adequate Health Literacy (03/27/2024)   See flowsheets for full screening details  Depression Screen PHQ 2 & 9 Depression Scale- Over the past 2 weeks, how often have you been bothered by any of the following problems? Little interest or pleasure in doing things: 0 Feeling down, depressed, or hopeless (PHQ Adolescent also  includes...irritable): 0 PHQ-2 Total Score: 0 Trouble falling or staying asleep, or sleeping too much: 0 Feeling tired or having little energy: 0 Poor appetite or overeating (PHQ Adolescent also includes...weight loss): 0 Feeling bad about yourself - or that you are a failure or have let yourself or your family down: 0 Trouble concentrating on things, such as reading the newspaper or watching television (PHQ Adolescent also includes...like school work): 0 Moving or speaking so slowly that other people could have noticed. Or the opposite - being so fidgety or restless that you have been moving around a lot more than usual: 0 Thoughts that you would be better off dead, or of hurting yourself in some way: 0 PHQ-9 Total Score: 0 If you checked off any problems, how difficult have these problems made it for you to do your work, take care of things at home, or get along with other people?: Not difficult at all     Goals Addressed             This Visit's Progress    Patient Stated   On track    Hopes to stay healthy and independent       Visit info / Clinical Intake: Medicare Wellness Visit Type:: Subsequent Annual Wellness Visit Persons participating in visit:: patient Medicare Wellness Visit Mode:: Telephone If telephone:: video declined Because this visit was a virtual/telehealth visit:: vitals recorded from last visit If Telephone or Video please confirm:: I connected with the patient using audio enabled telemedicine application and verified that I am speaking with the correct person using two identifiers  Patient Location:: home Provider Location:: home office Information given by:: patient Interpreter Needed?: No Pre-visit prep was completed: yes AWV questionnaire completed by patient prior to visit?: no Living arrangements:: (!) lives alone Patient's Overall Health Status Rating: very good Typical amount of pain: none Does pain affect daily life?: no Are you currently  prescribed opioids?: no  Dietary Habits and Nutritional Risks How many meals a day?: 2 Eats fruit and vegetables daily?: yes Most meals are obtained by: preparing own meals Diabetic:: no  Functional Status Activities of Daily Living (to include ambulation/medication): Independent Ambulation: Independent (pt use cane PRN) Medication Administration: Independent Home Management: Independent Manage your own finances?: yes Primary transportation is: driving Concerns about hearing?: no  Fall Screening Falls in the past year?: 0 Number of falls in past year: 0 Was there an injury with Fall?: 0 Fall Risk Category Calculator: 0 Patient Fall Risk Level: Low Fall Risk  Fall Risk Patient at Risk for Falls Due to: No Fall Risks Fall risk Follow up: Falls evaluation completed; Education provided  Home and Transportation Safety: All rugs have non-skid backing?: yes All stairs or steps have railings?: (!) no Grab bars in the bathtub or shower?: yes Have non-skid surface in bathtub or shower?: yes Good home lighting?: yes Regular seat belt use?: yes Hospital stays in the last year:: (!) yes How many hospital stays:: 3 Reason: pt had pneumonia  Cognitive Assessment Difficulty concentrating, remembering, or making decisions? : no Will 6CIT or Mini Cog be Completed: yes What year is it?: 0 points What month is it?: 0 points Give patient an address phrase to remember (5 components): [address] [place], [place] About what time is it?: 0 points Count backwards from 20 to 1: 0 points Say the months of the year in reverse: 0 points Repeat the address phrase from earlier: 0 points 6 CIT Score: 0 points  Advance Directives (For Healthcare) Does Patient Have a Medical Advance Directive?: No Would patient like information on creating a medical advance directive?: No - Patient declined  Reviewed/Updated  Reviewed/Updated: Reviewed All (Medical, Surgical, Family, Medications, Allergies, Care Teams,  Patient Goals); Medical History; Surgical History; Family History; Medications; Allergies; Care Teams; Patient Goals        Objective:    Today's Vitals   03/27/24 1335  BP: (!) 148/69  Pulse: 82  Weight: 192 lb (87.1 kg)  Height: 5' 3 (1.6 m)   Body mass index is 34.01 kg/m.  Current Medications (verified) Outpatient Encounter Medications as of 03/27/2024  Medication Sig   acetaminophen  (TYLENOL ) 500 MG tablet Take 1,000 mg by mouth every 8 (eight) hours as needed for headache or moderate pain. Pt takes 500 mg 2 tablets as needed.   alendronate  (FOSAMAX ) 70 MG tablet Take 1 tablet (70 mg total) by mouth once a week. Take with a full glass of water on an empty stomach.   amLODipine  (NORVASC ) 10 MG tablet Take 1 tablet (10 mg total) by mouth daily.   apixaban  (ELIQUIS ) 5 MG TABS tablet Take 1 tablet (5 mg total) by mouth 2 (two) times daily.   cetirizine  (ZYRTEC ) 10 MG tablet Take 1 tablet (10 mg total) by mouth daily.   esomeprazole  (NEXIUM ) 40 MG capsule TAKE 1 CAPSULE BY MOUTH DAILY   Evolocumab  (REPATHA  SURECLICK) 140 MG/ML SOAJ Inject 140 mg into the skin every 14 (fourteen) days.   fluticasone  (FLONASE ) 50 MCG/ACT nasal spray Place 2 sprays into both nostrils daily.   furosemide  (LASIX ) 20 MG tablet  Take 2 tablets (40 mg total) by mouth daily.   [START ON 03/29/2024] HYDROcodone -acetaminophen  (NORCO) 5-325 MG tablet Take 1 tablet by mouth every 8 (eight) hours as needed for moderate pain (pain score 4-6).   [START ON 04/28/2024] HYDROcodone -acetaminophen  (NORCO) 5-325 MG tablet Take 1 tablet by mouth every 8 (eight) hours as needed for moderate pain (pain score 4-6).   [START ON 05/28/2024] HYDROcodone -acetaminophen  (NORCO) 5-325 MG tablet Take 1 tablet by mouth every 8 (eight) hours as needed for moderate pain (pain score 4-6).   isosorbide  mononitrate (IMDUR ) 30 MG 24 hr tablet Take 0.5 tablets (15 mg total) by mouth daily.   lisinopril  (ZESTRIL ) 40 MG tablet Take 1 tablet (40  mg total) by mouth daily.   potassium chloride  (KLOR-CON ) 10 MEQ tablet Take 1 tablet (10 mEq total) by mouth 2 (two) times daily.   VITAMIN D  PO Take 1 capsule by mouth daily.   No facility-administered encounter medications on file as of 03/27/2024.   Hearing/Vision screen Hearing Screening - Comments:: Pt denies hearing dif Vision Screening - Comments:: Pt wear glasse/pt [facility] [place]/last ov 1 yr ago Immunizations and Health Maintenance Health Maintenance  Topic Date Due   DEXA SCAN  03/12/2025 (Originally 03/03/2023)   COVID-19 Vaccine (4 - 2025-26 season) 03/28/2025 (Originally 01/14/2024)   Medicare Annual Wellness (AWV)  03/27/2025   DTaP/Tdap/Td (2 - Td or Tdap) 07/19/2026   Pneumococcal Vaccine: 50+ Years  Completed   Influenza Vaccine  Completed   Meningococcal B Vaccine  Aged Out   Hepatitis C Screening  Discontinued   Zoster Vaccines- Shingrix  Discontinued        Assessment/Plan:  This is a routine wellness examination for [facility].  Patient Care Team: Joesph Annabella HERO, FNP as PCP - General (Family Medicine) Alvan, Dorn FALCON, MD as PCP - Cardiology (Cardiology) Leah Hugger, MD as Surgeon (Student)  I have personally reviewed and noted the following in the patient's chart:   Medical and social history Use of alcohol, tobacco or illicit drugs  Current medications and supplements including opioid prescriptions. Functional ability and status Nutritional status Physical activity Advanced directives List of other physicians Hospitalizations, surgeries, and ER visits in previous 12 months Vitals Screenings to include cognitive, depression, and falls Referrals and appointments  No orders of the defined types were placed in this encounter.  In addition, I have reviewed and discussed with patient certain preventive protocols, quality metrics, and best practice recommendations. A written personalized care plan for preventive services as well as general  preventive health recommendations were provided to patient.   Ozie Ned, CMA   03/27/2024   Return in 1 year (on 03/27/2025).  After Visit Summary: (MyChart) Due to this being a telephonic visit, the after visit summary with patients personalized plan was offered to patient via MyChart   Nurse Notes: n/a

## 2024-04-16 ENCOUNTER — Ambulatory Visit
Admission: RE | Admit: 2024-04-16 | Discharge: 2024-04-16 | Disposition: A | Source: Ambulatory Visit | Attending: Family Medicine

## 2024-04-16 DIAGNOSIS — Z1231 Encounter for screening mammogram for malignant neoplasm of breast: Secondary | ICD-10-CM

## 2024-06-16 ENCOUNTER — Ambulatory Visit: Admitting: Family Medicine

## 2024-06-23 ENCOUNTER — Ambulatory Visit: Admitting: Family Medicine

## 2024-07-10 ENCOUNTER — Ambulatory Visit: Payer: Self-pay | Admitting: Nurse Practitioner
